# Patient Record
Sex: Female | Born: 1940
Health system: Southern US, Community
[De-identification: ages and names within clinical notes are randomized; demographics above are authoritative.]

## PROBLEM LIST (undated history)

## (undated) DIAGNOSIS — R200 Anesthesia of skin: Secondary | ICD-10-CM

## (undated) DIAGNOSIS — I639 Cerebral infarction, unspecified: Secondary | ICD-10-CM

## (undated) DIAGNOSIS — R010 Benign and innocent cardiac murmurs: Secondary | ICD-10-CM

## (undated) DIAGNOSIS — R351 Nocturia: Secondary | ICD-10-CM

## (undated) DIAGNOSIS — Z22322 Carrier or suspected carrier of Methicillin resistant Staphylococcus aureus: Secondary | ICD-10-CM

## (undated) DIAGNOSIS — I82409 Acute embolism and thrombosis of unspecified deep veins of unspecified lower extremity: Secondary | ICD-10-CM

## (undated) DIAGNOSIS — F329 Major depressive disorder, single episode, unspecified: Secondary | ICD-10-CM

## (undated) DIAGNOSIS — M1990 Unspecified osteoarthritis, unspecified site: Secondary | ICD-10-CM

## (undated) DIAGNOSIS — R519 Headache, unspecified: Secondary | ICD-10-CM

## (undated) DIAGNOSIS — E785 Hyperlipidemia, unspecified: Secondary | ICD-10-CM

## (undated) DIAGNOSIS — I6529 Occlusion and stenosis of unspecified carotid artery: Secondary | ICD-10-CM

## (undated) DIAGNOSIS — E079 Disorder of thyroid, unspecified: Secondary | ICD-10-CM

## (undated) DIAGNOSIS — R350 Frequency of micturition: Secondary | ICD-10-CM

## (undated) DIAGNOSIS — I10 Essential (primary) hypertension: Secondary | ICD-10-CM

## (undated) DIAGNOSIS — E669 Obesity, unspecified: Secondary | ICD-10-CM

## (undated) DIAGNOSIS — F419 Anxiety disorder, unspecified: Secondary | ICD-10-CM

## (undated) DIAGNOSIS — Z8673 Personal history of transient ischemic attack (TIA), and cerebral infarction without residual deficits: Secondary | ICD-10-CM

## (undated) DIAGNOSIS — E039 Hypothyroidism, unspecified: Secondary | ICD-10-CM

## (undated) DIAGNOSIS — F32A Depression, unspecified: Secondary | ICD-10-CM

## (undated) DIAGNOSIS — R202 Paresthesia of skin: Secondary | ICD-10-CM

## (undated) HISTORY — DX: Depression, unspecified: F32.A

## (undated) HISTORY — DX: Carrier or suspected carrier of methicillin resistant Staphylococcus aureus: Z22.322

## (undated) HISTORY — DX: Obesity, unspecified: E66.9

## (undated) HISTORY — DX: Disorder of thyroid, unspecified: E07.9

## (undated) HISTORY — DX: Unspecified osteoarthritis, unspecified site: M19.90

## (undated) HISTORY — PX: KNEE ARTHROSCOPY: SUR90

## (undated) HISTORY — DX: Hypothyroidism, unspecified: E03.9

## (undated) HISTORY — DX: Acute embolism and thrombosis of unspecified deep veins of unspecified lower extremity: I82.409

## (undated) HISTORY — DX: Essential (primary) hypertension: I10

## (undated) HISTORY — DX: Hyperlipidemia, unspecified: E78.5

## (undated) HISTORY — DX: Major depressive disorder, single episode, unspecified: F32.9

## (undated) HISTORY — PX: COLONOSCOPY: SHX174

## (undated) HISTORY — PX: OTHER SURGICAL HISTORY: SHX169

---

## 1971-07-24 HISTORY — PX: ABDOMINAL HYSTERECTOMY: SHX81

## 1989-03-23 HISTORY — PX: OTHER SURGICAL HISTORY: SHX169

## 1998-07-23 HISTORY — PX: CARPAL TUNNEL RELEASE: SHX101

## 2000-12-05 ENCOUNTER — Encounter: Admission: RE | Admit: 2000-12-05 | Discharge: 2000-12-05 | Payer: Self-pay | Admitting: Orthopaedic Surgery

## 2000-12-05 ENCOUNTER — Encounter: Payer: Self-pay | Admitting: Orthopaedic Surgery

## 2001-06-09 ENCOUNTER — Other Ambulatory Visit: Admission: RE | Admit: 2001-06-09 | Discharge: 2001-06-09 | Payer: Self-pay | Admitting: Obstetrics and Gynecology

## 2002-05-22 ENCOUNTER — Encounter: Payer: Self-pay | Admitting: Orthopaedic Surgery

## 2002-05-26 ENCOUNTER — Inpatient Hospital Stay (HOSPITAL_COMMUNITY): Admission: RE | Admit: 2002-05-26 | Discharge: 2002-05-29 | Payer: Self-pay | Admitting: Orthopaedic Surgery

## 2002-05-26 HISTORY — PX: TOTAL KNEE ARTHROPLASTY: SHX125

## 2002-12-17 ENCOUNTER — Other Ambulatory Visit: Admission: RE | Admit: 2002-12-17 | Discharge: 2002-12-17 | Payer: Self-pay | Admitting: Obstetrics and Gynecology

## 2004-05-11 ENCOUNTER — Other Ambulatory Visit: Admission: RE | Admit: 2004-05-11 | Discharge: 2004-05-11 | Payer: Self-pay | Admitting: Obstetrics and Gynecology

## 2004-06-02 ENCOUNTER — Ambulatory Visit: Payer: Self-pay | Admitting: Internal Medicine

## 2004-07-21 ENCOUNTER — Encounter: Payer: Self-pay | Admitting: Orthopaedic Surgery

## 2004-07-23 DIAGNOSIS — I82409 Acute embolism and thrombosis of unspecified deep veins of unspecified lower extremity: Secondary | ICD-10-CM

## 2004-07-23 HISTORY — DX: Acute embolism and thrombosis of unspecified deep veins of unspecified lower extremity: I82.409

## 2004-08-03 ENCOUNTER — Ambulatory Visit: Payer: Self-pay | Admitting: Internal Medicine

## 2004-08-24 ENCOUNTER — Ambulatory Visit: Admission: RE | Admit: 2004-08-24 | Discharge: 2004-08-24 | Payer: Self-pay | Admitting: Orthopaedic Surgery

## 2004-08-28 ENCOUNTER — Ambulatory Visit: Payer: Self-pay | Admitting: Internal Medicine

## 2004-09-01 ENCOUNTER — Ambulatory Visit: Payer: Self-pay | Admitting: Internal Medicine

## 2004-09-08 ENCOUNTER — Ambulatory Visit: Payer: Self-pay | Admitting: Internal Medicine

## 2004-09-22 ENCOUNTER — Ambulatory Visit: Payer: Self-pay | Admitting: Internal Medicine

## 2004-10-23 ENCOUNTER — Ambulatory Visit: Payer: Self-pay | Admitting: Internal Medicine

## 2004-11-22 ENCOUNTER — Ambulatory Visit: Payer: Self-pay | Admitting: Internal Medicine

## 2004-12-04 ENCOUNTER — Ambulatory Visit: Payer: Self-pay | Admitting: Internal Medicine

## 2004-12-11 ENCOUNTER — Ambulatory Visit: Payer: Self-pay

## 2005-01-01 ENCOUNTER — Ambulatory Visit: Payer: Self-pay | Admitting: Internal Medicine

## 2005-01-03 ENCOUNTER — Ambulatory Visit: Payer: Self-pay | Admitting: Internal Medicine

## 2005-01-10 ENCOUNTER — Ambulatory Visit: Payer: Self-pay | Admitting: Internal Medicine

## 2005-02-08 ENCOUNTER — Ambulatory Visit: Payer: Self-pay | Admitting: Internal Medicine

## 2005-02-13 ENCOUNTER — Ambulatory Visit: Payer: Self-pay | Admitting: Internal Medicine

## 2005-02-16 ENCOUNTER — Encounter: Payer: Self-pay | Admitting: Internal Medicine

## 2005-03-08 ENCOUNTER — Encounter: Admission: RE | Admit: 2005-03-08 | Discharge: 2005-04-16 | Payer: Self-pay | Admitting: Internal Medicine

## 2005-04-11 ENCOUNTER — Ambulatory Visit: Payer: Self-pay | Admitting: Internal Medicine

## 2005-06-13 ENCOUNTER — Ambulatory Visit: Payer: Self-pay | Admitting: Internal Medicine

## 2005-07-05 ENCOUNTER — Ambulatory Visit: Payer: Self-pay | Admitting: Internal Medicine

## 2005-09-03 ENCOUNTER — Ambulatory Visit: Payer: Self-pay | Admitting: Infectious Diseases

## 2005-11-08 ENCOUNTER — Ambulatory Visit: Payer: Self-pay | Admitting: Internal Medicine

## 2005-11-15 ENCOUNTER — Ambulatory Visit: Payer: Self-pay | Admitting: Internal Medicine

## 2005-11-16 ENCOUNTER — Ambulatory Visit: Payer: Self-pay | Admitting: Family Medicine

## 2005-11-27 ENCOUNTER — Ambulatory Visit: Payer: Self-pay | Admitting: Internal Medicine

## 2006-03-28 ENCOUNTER — Ambulatory Visit (HOSPITAL_BASED_OUTPATIENT_CLINIC_OR_DEPARTMENT_OTHER): Admission: RE | Admit: 2006-03-28 | Discharge: 2006-03-29 | Payer: Self-pay | Admitting: Orthopedic Surgery

## 2006-03-28 HISTORY — PX: JOINT REPLACEMENT: SHX530

## 2006-04-04 ENCOUNTER — Ambulatory Visit: Payer: Self-pay | Admitting: Internal Medicine

## 2006-04-23 ENCOUNTER — Ambulatory Visit: Payer: Self-pay | Admitting: Internal Medicine

## 2006-07-18 ENCOUNTER — Ambulatory Visit: Payer: Self-pay | Admitting: Internal Medicine

## 2006-07-18 LAB — CONVERTED CEMR LAB
Creatinine,U: 71 mg/dL
Hgb A1c MFr Bld: 6.2 % — ABNORMAL HIGH (ref 4.6–6.0)
Microalb Creat Ratio: 7 mg/g (ref 0.0–30.0)

## 2006-11-11 DIAGNOSIS — M1990 Unspecified osteoarthritis, unspecified site: Secondary | ICD-10-CM | POA: Insufficient documentation

## 2006-11-11 DIAGNOSIS — R945 Abnormal results of liver function studies: Secondary | ICD-10-CM | POA: Insufficient documentation

## 2006-11-11 DIAGNOSIS — T50995A Adverse effect of other drugs, medicaments and biological substances, initial encounter: Secondary | ICD-10-CM | POA: Insufficient documentation

## 2006-11-11 DIAGNOSIS — Z9889 Other specified postprocedural states: Secondary | ICD-10-CM | POA: Insufficient documentation

## 2006-12-10 ENCOUNTER — Ambulatory Visit: Payer: Self-pay | Admitting: Internal Medicine

## 2006-12-10 ENCOUNTER — Encounter: Payer: Self-pay | Admitting: Internal Medicine

## 2006-12-10 LAB — CONVERTED CEMR LAB
BUN: 19 mg/dL (ref 6–23)
Basophils Absolute: 0 10*3/uL (ref 0.0–0.1)
Basophils Relative: 0.3 % (ref 0.0–1.0)
Creatinine, Ser: 0.6 mg/dL (ref 0.4–1.2)
Eosinophils Absolute: 0.2 10*3/uL (ref 0.0–0.6)
Eosinophils Relative: 3 % (ref 0.0–5.0)
HDL: 37 mg/dL — ABNORMAL LOW (ref >39.0)
Hemoglobin: 14.2 g/dL (ref 12.0–15.0)
Lymphocytes Relative: 24.9 % (ref 12.0–46.0)
MCV: 89.4 fL (ref 78.0–100.0)
Monocytes Absolute: 0.5 10*3/uL (ref 0.2–0.7)
Neutro Abs: 5.3 10*3/uL (ref 1.4–7.7)
Neutrophils Relative %: 65.1 % (ref 43.0–77.0)
Platelets: 230 10*3/uL (ref 150–400)
Potassium: 3.8 meq/L (ref 3.5–5.1)
TSH: 1.04 microintl units/mL (ref 0.35–5.50)
Triglycerides: 145 mg/dL (ref 0–149)
WBC: 8 10*3/uL (ref 4.5–10.5)

## 2006-12-24 ENCOUNTER — Ambulatory Visit: Payer: Self-pay | Admitting: Internal Medicine

## 2007-02-04 ENCOUNTER — Encounter: Payer: Self-pay | Admitting: Internal Medicine

## 2007-02-17 ENCOUNTER — Telehealth (INDEPENDENT_AMBULATORY_CARE_PROVIDER_SITE_OTHER): Payer: Self-pay | Admitting: *Deleted

## 2007-06-17 ENCOUNTER — Ambulatory Visit: Payer: Self-pay | Admitting: Internal Medicine

## 2007-06-17 DIAGNOSIS — M810 Age-related osteoporosis without current pathological fracture: Secondary | ICD-10-CM | POA: Insufficient documentation

## 2007-06-17 DIAGNOSIS — E782 Mixed hyperlipidemia: Secondary | ICD-10-CM | POA: Insufficient documentation

## 2007-06-17 DIAGNOSIS — E1159 Type 2 diabetes mellitus with other circulatory complications: Secondary | ICD-10-CM | POA: Insufficient documentation

## 2007-06-17 DIAGNOSIS — E1169 Type 2 diabetes mellitus with other specified complication: Secondary | ICD-10-CM | POA: Insufficient documentation

## 2007-06-17 DIAGNOSIS — I10 Essential (primary) hypertension: Secondary | ICD-10-CM | POA: Insufficient documentation

## 2007-06-17 DIAGNOSIS — E785 Hyperlipidemia, unspecified: Secondary | ICD-10-CM | POA: Insufficient documentation

## 2007-06-17 DIAGNOSIS — M8580 Other specified disorders of bone density and structure, unspecified site: Secondary | ICD-10-CM | POA: Insufficient documentation

## 2007-06-22 LAB — CONVERTED CEMR LAB
ALT: 20 units/L (ref 0–35)
AST: 18 units/L (ref 0–37)
Cholesterol: 168 mg/dL (ref 0–200)
Creatinine,U: 112.6 mg/dL
Hgb A1c MFr Bld: 6.3 % — ABNORMAL HIGH (ref 4.6–6.0)
Microalb, Ur: 2 mg/dL — ABNORMAL HIGH (ref 0.0–1.9)
Total CHOL/HDL Ratio: 5.2

## 2007-06-23 ENCOUNTER — Encounter (INDEPENDENT_AMBULATORY_CARE_PROVIDER_SITE_OTHER): Payer: Self-pay | Admitting: *Deleted

## 2007-09-19 ENCOUNTER — Telehealth (INDEPENDENT_AMBULATORY_CARE_PROVIDER_SITE_OTHER): Payer: Self-pay | Admitting: *Deleted

## 2007-11-13 ENCOUNTER — Telehealth (INDEPENDENT_AMBULATORY_CARE_PROVIDER_SITE_OTHER): Payer: Self-pay | Admitting: *Deleted

## 2007-11-13 ENCOUNTER — Ambulatory Visit: Payer: Self-pay | Admitting: Family Medicine

## 2007-11-20 ENCOUNTER — Ambulatory Visit: Payer: Self-pay | Admitting: Family Medicine

## 2007-11-21 ENCOUNTER — Encounter: Payer: Self-pay | Admitting: Family Medicine

## 2007-11-23 LAB — CONVERTED CEMR LAB: Vit D, 1,25-Dihydroxy: 26 — ABNORMAL LOW (ref 30–89)

## 2007-11-25 ENCOUNTER — Encounter (INDEPENDENT_AMBULATORY_CARE_PROVIDER_SITE_OTHER): Payer: Self-pay | Admitting: *Deleted

## 2007-12-03 ENCOUNTER — Encounter: Payer: Self-pay | Admitting: Internal Medicine

## 2007-12-10 ENCOUNTER — Ambulatory Visit: Payer: Self-pay | Admitting: Internal Medicine

## 2007-12-10 DIAGNOSIS — E559 Vitamin D deficiency, unspecified: Secondary | ICD-10-CM | POA: Insufficient documentation

## 2007-12-10 LAB — CONVERTED CEMR LAB
Microalb Creat Ratio: 79 mg/g — ABNORMAL HIGH (ref 0.0–30.0)
Microalb, Ur: 6.2 mg/dL — ABNORMAL HIGH (ref 0.0–1.9)

## 2007-12-12 ENCOUNTER — Encounter (INDEPENDENT_AMBULATORY_CARE_PROVIDER_SITE_OTHER): Payer: Self-pay | Admitting: *Deleted

## 2007-12-23 ENCOUNTER — Ambulatory Visit: Payer: Self-pay | Admitting: Internal Medicine

## 2007-12-23 ENCOUNTER — Encounter: Payer: Self-pay | Admitting: Internal Medicine

## 2007-12-26 ENCOUNTER — Telehealth (INDEPENDENT_AMBULATORY_CARE_PROVIDER_SITE_OTHER): Payer: Self-pay | Admitting: *Deleted

## 2008-01-19 ENCOUNTER — Encounter (INDEPENDENT_AMBULATORY_CARE_PROVIDER_SITE_OTHER): Payer: Self-pay | Admitting: *Deleted

## 2008-02-26 ENCOUNTER — Ambulatory Visit: Payer: Self-pay | Admitting: Internal Medicine

## 2008-02-27 ENCOUNTER — Ambulatory Visit: Payer: Self-pay | Admitting: Internal Medicine

## 2008-03-01 ENCOUNTER — Encounter (INDEPENDENT_AMBULATORY_CARE_PROVIDER_SITE_OTHER): Payer: Self-pay | Admitting: *Deleted

## 2008-03-01 LAB — CONVERTED CEMR LAB
ALT: 21 units/L (ref 0–35)
AST: 19 units/L (ref 0–37)
Bilirubin, Direct: 0.1 mg/dL (ref 0.0–0.3)
Creatinine, Ser: 0.6 mg/dL (ref 0.4–1.2)
HDL: 29.7 mg/dL — ABNORMAL LOW (ref >39.0)
Hgb A1c MFr Bld: 6.6 % — ABNORMAL HIGH (ref 4.6–6.0)
Potassium: 4.3 meq/L (ref 3.5–5.1)
Total Bilirubin: 1 mg/dL (ref 0.3–1.2)
Total CHOL/HDL Ratio: 3.9

## 2008-03-07 LAB — CONVERTED CEMR LAB: Vit D, 1,25-Dihydroxy: 35 (ref 30–89)

## 2008-03-08 ENCOUNTER — Encounter (INDEPENDENT_AMBULATORY_CARE_PROVIDER_SITE_OTHER): Payer: Self-pay | Admitting: *Deleted

## 2008-03-30 ENCOUNTER — Inpatient Hospital Stay (HOSPITAL_COMMUNITY): Admission: RE | Admit: 2008-03-30 | Discharge: 2008-04-02 | Payer: Self-pay | Admitting: Orthopaedic Surgery

## 2008-04-12 ENCOUNTER — Ambulatory Visit: Payer: Self-pay | Admitting: Vascular Surgery

## 2008-04-12 ENCOUNTER — Encounter (INDEPENDENT_AMBULATORY_CARE_PROVIDER_SITE_OTHER): Payer: Self-pay | Admitting: Orthopaedic Surgery

## 2008-04-12 ENCOUNTER — Ambulatory Visit (HOSPITAL_COMMUNITY): Admission: RE | Admit: 2008-04-12 | Discharge: 2008-04-12 | Payer: Self-pay | Admitting: Orthopaedic Surgery

## 2008-04-14 ENCOUNTER — Ambulatory Visit: Payer: Self-pay | Admitting: Internal Medicine

## 2008-04-14 DIAGNOSIS — L089 Local infection of the skin and subcutaneous tissue, unspecified: Secondary | ICD-10-CM | POA: Insufficient documentation

## 2008-05-05 ENCOUNTER — Ambulatory Visit: Payer: Self-pay | Admitting: Internal Medicine

## 2008-05-10 ENCOUNTER — Telehealth (INDEPENDENT_AMBULATORY_CARE_PROVIDER_SITE_OTHER): Payer: Self-pay | Admitting: *Deleted

## 2008-06-08 ENCOUNTER — Telehealth (INDEPENDENT_AMBULATORY_CARE_PROVIDER_SITE_OTHER): Payer: Self-pay | Admitting: *Deleted

## 2008-07-05 ENCOUNTER — Ambulatory Visit: Payer: Self-pay | Admitting: Internal Medicine

## 2008-07-08 ENCOUNTER — Encounter (INDEPENDENT_AMBULATORY_CARE_PROVIDER_SITE_OTHER): Payer: Self-pay | Admitting: *Deleted

## 2008-07-08 ENCOUNTER — Telehealth (INDEPENDENT_AMBULATORY_CARE_PROVIDER_SITE_OTHER): Payer: Self-pay | Admitting: *Deleted

## 2008-07-08 LAB — CONVERTED CEMR LAB
BUN: 18 mg/dL (ref 6–23)
Creatinine, Ser: 0.7 mg/dL (ref 0.4–1.2)
Hgb A1c MFr Bld: 6 % (ref 4.6–6.0)
Microalb, Ur: 0.5 mg/dL (ref 0.0–1.9)
Potassium: 4.1 meq/L (ref 3.5–5.1)

## 2008-07-12 ENCOUNTER — Ambulatory Visit: Payer: Self-pay | Admitting: Internal Medicine

## 2008-07-12 DIAGNOSIS — R5381 Other malaise: Secondary | ICD-10-CM | POA: Insufficient documentation

## 2008-07-12 DIAGNOSIS — R5383 Other fatigue: Secondary | ICD-10-CM | POA: Insufficient documentation

## 2008-07-12 LAB — CONVERTED CEMR LAB
Basophils Relative: 0.2 % (ref 0.0–3.0)
Eosinophils Relative: 2.6 % (ref 0.0–5.0)
Hemoglobin: 14.1 g/dL (ref 12.0–15.0)
Lymphocytes Relative: 25.6 % (ref 12.0–46.0)
MCV: 90 fL (ref 78.0–100.0)
Neutro Abs: 4.6 10*3/uL (ref 1.4–7.7)
Neutrophils Relative %: 63.7 % (ref 43.0–77.0)

## 2008-07-13 ENCOUNTER — Encounter (INDEPENDENT_AMBULATORY_CARE_PROVIDER_SITE_OTHER): Payer: Self-pay | Admitting: *Deleted

## 2008-08-24 ENCOUNTER — Emergency Department (HOSPITAL_COMMUNITY): Admission: EM | Admit: 2008-08-24 | Discharge: 2008-08-24 | Payer: Self-pay | Admitting: Emergency Medicine

## 2008-09-20 ENCOUNTER — Encounter: Payer: Self-pay | Admitting: Internal Medicine

## 2008-11-11 ENCOUNTER — Ambulatory Visit: Payer: Self-pay | Admitting: Internal Medicine

## 2008-11-11 LAB — CONVERTED CEMR LAB
Nitrite: NEGATIVE
Protein, U semiquant: NEGATIVE
Specific Gravity, Urine: 1.015

## 2008-11-12 ENCOUNTER — Encounter: Payer: Self-pay | Admitting: Internal Medicine

## 2008-11-15 ENCOUNTER — Encounter (INDEPENDENT_AMBULATORY_CARE_PROVIDER_SITE_OTHER): Payer: Self-pay | Admitting: *Deleted

## 2009-01-05 ENCOUNTER — Ambulatory Visit: Payer: Self-pay | Admitting: Internal Medicine

## 2009-01-10 ENCOUNTER — Encounter (INDEPENDENT_AMBULATORY_CARE_PROVIDER_SITE_OTHER): Payer: Self-pay | Admitting: *Deleted

## 2009-01-21 ENCOUNTER — Ambulatory Visit: Payer: Self-pay | Admitting: Internal Medicine

## 2009-01-21 DIAGNOSIS — E041 Nontoxic single thyroid nodule: Secondary | ICD-10-CM | POA: Insufficient documentation

## 2009-01-25 ENCOUNTER — Encounter (INDEPENDENT_AMBULATORY_CARE_PROVIDER_SITE_OTHER): Payer: Self-pay | Admitting: *Deleted

## 2009-01-26 ENCOUNTER — Encounter: Admission: RE | Admit: 2009-01-26 | Discharge: 2009-01-26 | Payer: Self-pay | Admitting: Internal Medicine

## 2009-01-28 ENCOUNTER — Encounter (INDEPENDENT_AMBULATORY_CARE_PROVIDER_SITE_OTHER): Payer: Self-pay | Admitting: *Deleted

## 2009-02-03 ENCOUNTER — Telehealth (INDEPENDENT_AMBULATORY_CARE_PROVIDER_SITE_OTHER): Payer: Self-pay | Admitting: *Deleted

## 2009-02-22 ENCOUNTER — Encounter (INDEPENDENT_AMBULATORY_CARE_PROVIDER_SITE_OTHER): Payer: Self-pay | Admitting: Interventional Radiology

## 2009-02-22 ENCOUNTER — Other Ambulatory Visit: Admission: RE | Admit: 2009-02-22 | Discharge: 2009-02-22 | Payer: Self-pay | Admitting: Interventional Radiology

## 2009-02-22 ENCOUNTER — Encounter: Admission: RE | Admit: 2009-02-22 | Discharge: 2009-02-22 | Payer: Self-pay | Admitting: Family Medicine

## 2009-02-22 ENCOUNTER — Encounter: Payer: Self-pay | Admitting: Internal Medicine

## 2009-03-02 ENCOUNTER — Telehealth (INDEPENDENT_AMBULATORY_CARE_PROVIDER_SITE_OTHER): Payer: Self-pay | Admitting: *Deleted

## 2009-04-29 ENCOUNTER — Encounter: Payer: Self-pay | Admitting: Internal Medicine

## 2009-05-11 ENCOUNTER — Encounter: Admission: RE | Admit: 2009-05-11 | Discharge: 2009-05-11 | Payer: Self-pay | Admitting: Neurosurgery

## 2009-05-25 ENCOUNTER — Encounter: Admission: RE | Admit: 2009-05-25 | Discharge: 2009-05-25 | Payer: Self-pay | Admitting: Neurosurgery

## 2009-06-09 ENCOUNTER — Encounter: Payer: Self-pay | Admitting: Internal Medicine

## 2009-06-28 ENCOUNTER — Ambulatory Visit: Payer: Self-pay | Admitting: Internal Medicine

## 2009-07-04 ENCOUNTER — Ambulatory Visit: Payer: Self-pay | Admitting: Internal Medicine

## 2009-07-04 LAB — CONVERTED CEMR LAB
Albumin: 4 g/dL (ref 3.5–5.2)
Bilirubin, Direct: 0 mg/dL (ref 0.0–0.3)
Cholesterol: 147 mg/dL (ref 0–200)
Hgb A1c MFr Bld: 6.1 % (ref 4.6–6.5)
Total Bilirubin: 0.8 mg/dL (ref 0.3–1.2)
Total Protein: 7 g/dL (ref 6.0–8.3)
VLDL: 24.2 mg/dL (ref 0.0–40.0)

## 2009-08-01 ENCOUNTER — Encounter: Payer: Self-pay | Admitting: Internal Medicine

## 2009-08-26 ENCOUNTER — Inpatient Hospital Stay (HOSPITAL_COMMUNITY): Admission: RE | Admit: 2009-08-26 | Discharge: 2009-08-28 | Payer: Self-pay | Admitting: Neurosurgery

## 2009-08-26 HISTORY — PX: LUMBAR LAMINECTOMY: SHX95

## 2009-09-22 ENCOUNTER — Encounter: Payer: Self-pay | Admitting: Internal Medicine

## 2009-10-11 ENCOUNTER — Ambulatory Visit: Payer: Self-pay | Admitting: Internal Medicine

## 2009-10-12 ENCOUNTER — Encounter: Payer: Self-pay | Admitting: Internal Medicine

## 2009-10-14 ENCOUNTER — Telehealth (INDEPENDENT_AMBULATORY_CARE_PROVIDER_SITE_OTHER): Payer: Self-pay | Admitting: *Deleted

## 2009-10-18 LAB — CONVERTED CEMR LAB: Hgb A1c MFr Bld: 5.8 % (ref 4.6–6.5)

## 2009-12-29 ENCOUNTER — Encounter: Payer: Self-pay | Admitting: Internal Medicine

## 2010-01-24 ENCOUNTER — Ambulatory Visit: Payer: Self-pay | Admitting: Internal Medicine

## 2010-01-24 DIAGNOSIS — E119 Type 2 diabetes mellitus without complications: Secondary | ICD-10-CM | POA: Insufficient documentation

## 2010-01-24 DIAGNOSIS — E1329 Other specified diabetes mellitus with other diabetic kidney complication: Secondary | ICD-10-CM | POA: Insufficient documentation

## 2010-01-24 DIAGNOSIS — E1365 Other specified diabetes mellitus with hyperglycemia: Secondary | ICD-10-CM

## 2010-01-24 DIAGNOSIS — IMO0002 Reserved for concepts with insufficient information to code with codable children: Secondary | ICD-10-CM | POA: Insufficient documentation

## 2010-01-24 DIAGNOSIS — R300 Dysuria: Secondary | ICD-10-CM | POA: Insufficient documentation

## 2010-01-24 DIAGNOSIS — E1169 Type 2 diabetes mellitus with other specified complication: Secondary | ICD-10-CM | POA: Insufficient documentation

## 2010-01-24 LAB — CONVERTED CEMR LAB
Bilirubin Urine: NEGATIVE
Ketones, urine, test strip: NEGATIVE
Protein, U semiquant: NEGATIVE
Urobilinogen, UA: 0.2

## 2010-01-25 ENCOUNTER — Ambulatory Visit: Payer: Self-pay | Admitting: Internal Medicine

## 2010-01-26 ENCOUNTER — Telehealth (INDEPENDENT_AMBULATORY_CARE_PROVIDER_SITE_OTHER): Payer: Self-pay | Admitting: *Deleted

## 2010-01-26 ENCOUNTER — Encounter: Admission: RE | Admit: 2010-01-26 | Discharge: 2010-01-26 | Payer: Self-pay | Admitting: Internal Medicine

## 2010-01-26 LAB — CONVERTED CEMR LAB
ALT: 18 units/L (ref 0–35)
Alkaline Phosphatase: 47 units/L (ref 39–117)
Basophils Relative: 0.3 % (ref 0.0–3.0)
Bilirubin, Direct: 0.2 mg/dL (ref 0.0–0.3)
Chloride: 106 meq/L (ref 96–112)
Creatinine, Ser: 0.6 mg/dL (ref 0.4–1.2)
Creatinine,U: 112.9 mg/dL
Eosinophils Absolute: 0.2 10*3/uL (ref 0.0–0.7)
Hgb A1c MFr Bld: 6.5 % (ref 4.6–6.5)
LDL Cholesterol: 67 mg/dL (ref 0–99)
Lymphocytes Relative: 19.4 % (ref 12.0–46.0)
MCHC: 34.8 g/dL (ref 30.0–36.0)
Microalb Creat Ratio: 1.2 mg/g (ref 0.0–30.0)
Neutro Abs: 5.7 10*3/uL (ref 1.4–7.7)
Neutrophils Relative %: 68.9 % (ref 43.0–77.0)
Potassium: 4.6 meq/L (ref 3.5–5.1)
RBC: 4.35 M/uL (ref 3.87–5.11)
RDW: 13.4 % (ref 11.5–14.6)
Sodium: 140 meq/L (ref 135–145)
Total Bilirubin: 0.6 mg/dL (ref 0.3–1.2)
Total CHOL/HDL Ratio: 4
Triglycerides: 163 mg/dL — ABNORMAL HIGH (ref 0.0–149.0)

## 2010-02-10 ENCOUNTER — Telehealth (INDEPENDENT_AMBULATORY_CARE_PROVIDER_SITE_OTHER): Payer: Self-pay | Admitting: *Deleted

## 2010-04-11 ENCOUNTER — Ambulatory Visit: Payer: Self-pay | Admitting: Internal Medicine

## 2010-04-11 LAB — CONVERTED CEMR LAB
Ketones, urine, test strip: NEGATIVE
Nitrite: NEGATIVE
Urobilinogen, UA: 0.2

## 2010-04-12 ENCOUNTER — Encounter: Payer: Self-pay | Admitting: Internal Medicine

## 2010-04-14 ENCOUNTER — Ambulatory Visit: Payer: Self-pay | Admitting: Internal Medicine

## 2010-04-14 ENCOUNTER — Encounter: Payer: Self-pay | Admitting: Internal Medicine

## 2010-05-12 ENCOUNTER — Encounter: Payer: Self-pay | Admitting: Internal Medicine

## 2010-05-31 ENCOUNTER — Ambulatory Visit: Payer: Self-pay | Admitting: Internal Medicine

## 2010-05-31 DIAGNOSIS — I6350 Cerebral infarction due to unspecified occlusion or stenosis of unspecified cerebral artery: Secondary | ICD-10-CM | POA: Insufficient documentation

## 2010-05-31 LAB — CONVERTED CEMR LAB
ALT: 16 units/L (ref 0–35)
AST: 18 units/L (ref 0–37)
Albumin: 4.2 g/dL (ref 3.5–5.2)
Alkaline Phosphatase: 48 units/L (ref 39–117)
Chloride: 103 meq/L (ref 96–112)
Eosinophils Relative: 2.2 % (ref 0.0–5.0)
GFR calc non Af Amer: 86.05 mL/min (ref >60)
Glucose, Bld: 203 mg/dL — ABNORMAL HIGH (ref 70–99)
HCT: 38.6 % (ref 36.0–46.0)
Hemoglobin: 13.7 g/dL (ref 12.0–15.0)
Hgb A1c MFr Bld: 6.9 % — ABNORMAL HIGH (ref 4.6–6.5)
Lymphs Abs: 1.8 10*3/uL (ref 0.7–4.0)
MCV: 91.7 fL (ref 78.0–100.0)
Monocytes Relative: 6.9 % (ref 3.0–12.0)
Neutro Abs: 6 10*3/uL (ref 1.4–7.7)
Potassium: 4.6 meq/L (ref 3.5–5.1)
RDW: 13.7 % (ref 11.5–14.6)
Sodium: 141 meq/L (ref 135–145)
Total Protein: 6.4 g/dL (ref 6.0–8.3)
WBC: 8.6 10*3/uL (ref 4.5–10.5)

## 2010-06-01 ENCOUNTER — Ambulatory Visit: Payer: Self-pay | Admitting: Internal Medicine

## 2010-06-01 ENCOUNTER — Ambulatory Visit: Payer: Self-pay | Admitting: Cardiology

## 2010-06-07 ENCOUNTER — Encounter: Admission: RE | Admit: 2010-06-07 | Discharge: 2010-06-07 | Payer: Self-pay | Admitting: Neurology

## 2010-06-12 ENCOUNTER — Encounter: Payer: Self-pay | Admitting: Internal Medicine

## 2010-06-19 ENCOUNTER — Ambulatory Visit (HOSPITAL_COMMUNITY): Admission: RE | Admit: 2010-06-19 | Discharge: 2010-06-19 | Payer: Self-pay | Admitting: Neurology

## 2010-06-19 ENCOUNTER — Encounter (INDEPENDENT_AMBULATORY_CARE_PROVIDER_SITE_OTHER): Payer: Self-pay | Admitting: Neurology

## 2010-06-19 ENCOUNTER — Ambulatory Visit: Payer: Self-pay | Admitting: Internal Medicine

## 2010-06-19 ENCOUNTER — Ambulatory Visit: Payer: Self-pay

## 2010-06-26 ENCOUNTER — Telehealth: Payer: Self-pay | Admitting: Internal Medicine

## 2010-07-18 ENCOUNTER — Telehealth: Payer: Self-pay | Admitting: Internal Medicine

## 2010-08-07 ENCOUNTER — Encounter
Admission: RE | Admit: 2010-08-07 | Discharge: 2010-08-07 | Payer: Self-pay | Source: Home / Self Care | Attending: Neurology | Admitting: Neurology

## 2010-08-08 ENCOUNTER — Ambulatory Visit
Admission: RE | Admit: 2010-08-08 | Discharge: 2010-08-08 | Payer: Self-pay | Source: Home / Self Care | Attending: Internal Medicine | Admitting: Internal Medicine

## 2010-08-08 ENCOUNTER — Other Ambulatory Visit: Payer: Self-pay | Admitting: Internal Medicine

## 2010-08-08 LAB — CONVERTED CEMR LAB
Glucose, Urine, Semiquant: NEGATIVE
Nitrite: POSITIVE
Urobilinogen, UA: 0.2
pH: 5

## 2010-08-08 LAB — HEMOGLOBIN A1C: Hgb A1c MFr Bld: 6.4 % (ref 4.6–6.5)

## 2010-08-08 LAB — BUN: BUN: 18 mg/dL (ref 6–23)

## 2010-08-08 LAB — POTASSIUM: Potassium: 4.4 mEq/L (ref 3.5–5.1)

## 2010-08-08 LAB — CREATININE, SERUM: Creatinine, Ser: 0.7 mg/dL (ref 0.4–1.2)

## 2010-08-09 ENCOUNTER — Encounter: Payer: Self-pay | Admitting: Internal Medicine

## 2010-08-11 ENCOUNTER — Telehealth (INDEPENDENT_AMBULATORY_CARE_PROVIDER_SITE_OTHER): Payer: Self-pay | Admitting: *Deleted

## 2010-08-20 LAB — CONVERTED CEMR LAB
T3, Free: 2.9 pg/mL (ref 2.3–4.2)
Vit D, 25-Hydroxy: 24 ng/mL — ABNORMAL LOW (ref 30–89)

## 2010-08-24 NOTE — Progress Notes (Signed)
Summary: Culture Results  Phone Note Outgoing Call Call back at Home Phone [phone]   Call placed by: Shonna Chock,  January 26, 2010 4:58 PM Call placed to: Patient Summary of Call: Per Dr.Hopper patient with UTI please contact patient and send in RX for Cipro #20 two times a day if not allergic   I reviewed allergy list, cipro not listed. I left message on VM informing patient current UTI, rx sent to pharmacy on file [facility]. Patient to call if any questions or concerns./Chrae [facility]  January 26, 2010 5:00 PM     New/Updated Medications: CIPRO 500 MG TABS (CIPROFLOXACIN HCL) 1 by mouth two times a day Prescriptions: CIPRO 500 MG TABS (CIPROFLOXACIN HCL) 1 by mouth two times a day  #20 x 0   Entered by:   Shonna Chock   Authorized by:   Marga Melnick MD   Signed by:   Shonna Chock on 01/26/2010   Method used:   Electronically to        [facility] [phone][facility] (retail)       [address] [place], [place]  [address]       Ph: [phone]       Fax: [phone]   RxID:   [id number]

## 2010-08-24 NOTE — Progress Notes (Signed)
Summary: Januvia/Metformin  Phone Note Call from Patient Call back at Home Phone [phone]   Summary of Call: Patient left message on triage asking if she is to continue Januvia? If she is, she was also wondering if she should stop Metformin. Please advise. Initial call taken by: Lucious Groves CMA,  June 26, 2010 3:04 PM  Follow-up for Phone Call        Januvia 100 mg once daily & Metformin 1000 mg two times a day with meals. Check A1c, BUN,creat & K+ after 6 weeks . A1c was 6.9%, just barely @ goal of < 7% in 11/11 Follow-up by: Marga Melnick MD,  June 26, 2010 5:31 PM  Additional Follow-up for Phone Call Additional follow up Details #1::        Patient notified of the above earlier today and all prescriptions have been faxed.  Additional Follow-up by: Lucious Groves CMA,  June 27, 2010 3:26 PM    Prescriptions: JANUVIA 100 MG TABS (SITAGLIPTIN PHOSPHATE) 1 once daily  #30 x 0   Entered by:   Lucious Groves CMA   Authorized by:   Marga Melnick MD   Signed by:   Lucious Groves CMA on 06/27/2010   Method used:   Electronically to        [facility] [phone]* (retail)       [address] [place], [place]  [address]       Ph: [phone]       Fax: [phone]   RxID:   [id number] JANUVIA 100 MG TABS (SITAGLIPTIN PHOSPHATE) 1 once daily  #90 x 0   Entered by:   Lucious Groves CMA   Authorized by:   Marga Melnick MD   Signed by:   Lucious Groves CMA on 06/27/2010   Method used:   Printed then faxed to ...       [facility])       [address] [address]       [place], [place]  [address]       Ph: [phone]       Fax: [phone]   RxID:   [id number] CITALOPRAM HYDROBROMIDE 20 MG TABS (CITALOPRAM HYDROBROMIDE) 1 once daily  #90 x 0   Entered by:   Lucious Groves CMA   Authorized by:   Marga Melnick MD   Signed by:   Lucious Groves CMA on 06/27/2010   Method used:   Printed then faxed to ...       [facility])       [address] [address]   [place], [place]  [address]       Ph: [phone]       Fax: [phone]   RxID:   [id number] BENAZEPRIL HCL 20 MG TABS (BENAZEPRIL HCL) 1 qd  #90 x 0   Entered by:   Lucious Groves CMA   Authorized by:   Marga Melnick MD   Signed by:   Lucious Groves CMA on 06/27/2010   Method used:   Printed then faxed to ...       [facility])       [address] [address]       [place], [place]  [address]       Ph: [phone]       Fax: [phone]   RxID:   [id number] PRAVASTATIN SODIUM 40 MG  TABS (PRAVASTATIN SODIUM) 1 qhs  #90 x 0   Entered by:  Lucious Groves CMA   Authorized by:   Marga Melnick MD   Signed by:   Lucious Groves CMA on 06/27/2010   Method used:   Printed then faxed to ...       [facility])       [address] [address]       [place], [place]  [address]       Ph: [phone]       Fax: [phone]   RxID:   [id number] METFORMIN HCL 1000 MG  TABS (METFORMIN HCL) TAKE ONE TABLET BY MOUTH TWICE A DAY  #180 x 0   Entered by:   Lucious Groves CMA   Authorized by:   Marga Melnick MD   Signed by:   Lucious Groves CMA on 06/27/2010   Method used:   Printed then faxed to ...       [facility])       [address] [address]       [place], [place]  [address]       Ph: [phone]       Fax: [phone]   RxID:   [id number] KLONOPIN 0.5 MG  TABS (CLONAZEPAM) TAKE ONE TABLET BY MOUTH AT NIGHT  #90 x 0   Entered by:   Lucious Groves CMA   Authorized by:   Marga Melnick MD   Signed by:   Lucious Groves CMA on 06/27/2010   Method used:   Printed then faxed to ...       [facility])       [address] [address]       [place], [place]  [address]       Ph: [phone]       Fax: [phone]   RxID:   [id number]

## 2010-08-24 NOTE — Miscellaneous (Signed)
Summary: BONE DENSITY  Clinical Lists Changes  Orders: Added new Test order of T-Bone Densitometry (77080) - Signed Added new Test order of T-Lumbar Vertebral Assessment (77082) - Signed 

## 2010-08-24 NOTE — Progress Notes (Signed)
Summary: NEEDS PRESCRIPTION FOR METFORMIN  Phone Note Call from Patient Call back at Home Phone [phone]   Caller: Patient Summary of Call: [facility] CALLED TO GET REFILL FOR HER METFORMIN  SAID THAT DR HOPPER NEEDED TO LOOK AT HER A1C READING FROM HER LAST LABS AND THEN HE WOULD SEND PRESCRIPTION PLUS REFILLS IN TO [facility] FOR HER   Initial call taken by: Jerolyn Shin,  February 10, 2010 10:08 AM    Prescriptions: METFORMIN HCL 1000 MG  TABS (METFORMIN HCL) TAKE ONE TABLET BY MOUTH TWICE A DAY  #180 x 1   Entered by:   Shonna Chock CMA   Authorized by:   Marga Melnick MD   Signed by:   Shonna Chock CMA on 02/10/2010   Method used:   Faxed to ...       [facility])       [address] [address]       [place], [place]  [address]       Ph: [phone]       Fax: [phone]   RxID:   [id number]

## 2010-08-24 NOTE — Assessment & Plan Note (Signed)
Summary: F/U per Dr.Hopper/scm   Vital Signs:  Patient profile:   70 year old female Temp:     97.8 degrees F oral Pulse rate:   76 / minute Resp:     14 per minute BP sitting:   120 / 78  (left arm) Cuff size:   large  Vitals Entered By: Shonna Chock CMA (June 01, 2010 2:24 PM)  History of Present Illness:    Findings of hemorrhagic CVA on CT & labs discussed. DM adequately controlled @ 6.9%. She is using a cane because of being "unsteady" & fatigued. Overall there has no significant change  , but  face may be less numb.  Current Medications (verified): 1)  Caltrate With Vitamin D .... Once Daily 2)  Klonopin 0.5 Mg  Tabs (Clonazepam) .... Take One Tablet By Mouth At Night 3)  Metformin Hcl 1000 Mg  Tabs (Metformin Hcl) .... Take One Tablet By Mouth Twice A Day 4)  Baby Asa .... Once Daily 5)  Pravastatin Sodium 40 Mg  Tabs (Pravastatin Sodium) .Marland Kitchen.. 1 Qhs 6)  Freestyle Lite Test   Strp (Glucose Blood) .... Use As Directed 7)  Veramyst 27.5 Mcg/spray  Susp (Fluticasone Furoate) .... 2 Sprays Each Nostril Once Daily 8)  Benazepril Hcl 20 Mg Tabs (Benazepril Hcl) .Marland Kitchen.. 1 Qd 9)  Vit-D 600mg  .... 1 By Mouth Once Daily 10)  Citalopram Hydrobromide 20 Mg Tabs (Citalopram Hydrobromide) .Marland Kitchen.. 1 Once Daily  Allergies: 1)  ! Premarin  Review of Systems General:  Denies chills, fever, and sweats. Neuro:  Complains of disturbances in coordination and weakness; denies brief paralysis and memory loss; No change in R sided N & T except for face. She has been dropping items such as silverware.No significant headache.  Physical Exam  General:  well-nourished,in no acute distress; alert,appropriate and cooperative throughout examination Eyes:  No corneal or conjunctival inflammation noted. EOMI. Perrla. Field of  Vision grossly normal.Slight ptosis Mouth:  Oral mucosa and oropharynx without lesions or exudates.  Teeth in good repair. No tongue deviation Heart:  normal rate, regular rhythm,  no gallop, no rub, no JVD, and grade 1 /6 systolic murmur.   Pulses:  R and L carotid  pulses are full and equal bilaterallyw/o bruits Extremities:  No clubbing, cyanosis, edema. Neurologic:  alert & oriented X3, cranial nerves II-XII intact except decreased sensation R face, strength normal in all extremities ( definitely stronger in R thigh), gait abnormal with slight limp RLE( "better"), and DTRs symmetrical and normal( R knee had been decreased).   Skin:  Excoriations R back dry Psych:  memory intact for recent and remote, normally interactive, good eye contact, and not anxious appearing.     Impression & Recommendations:  Problem # 1:  C V A/STROKE (ICD-434.91) acute onset 05/30/2010 Orders: Neurology Referral (Neuro)  Problem # 2:  DM (ICD-250.00)  Her updated medication list for this problem includes:    Metformin Hcl 1000 Mg Tabs (Metformin hcl) .Marland Kitchen... Take one tablet by mouth twice a day    Benazepril Hcl 20 Mg Tabs (Benazepril hcl) .Marland Kitchen... 1 qd    Januvia 100 Mg Tabs (Sitagliptin phosphate) .Marland Kitchen... 1 once daily  Complete Medication List: 1)  Caltrate With Vitamin D  .... Once daily 2)  Klonopin 0.5 Mg Tabs (Clonazepam) .... Take one tablet by mouth at night 3)  Metformin Hcl 1000 Mg Tabs (Metformin hcl) .... Take one tablet by mouth twice a day 4)  Baby Asa  .... Once daily  5)  Pravastatin Sodium 40 Mg Tabs (Pravastatin sodium) .Marland Kitchen.. 1 qhs 6)  Freestyle Lite Test Strp (Glucose blood) .... Use as directed 7)  Veramyst 27.5 Mcg/spray Susp (Fluticasone furoate) .... 2 sprays each nostril once daily 8)  Benazepril Hcl 20 Mg Tabs (Benazepril hcl) .Marland Kitchen.. 1 qd 9)  Vit-d 600mg   .... 1 by mouth once daily 10)  Citalopram Hydrobromide 20 Mg Tabs (Citalopram hydrobromide) .Marland Kitchen.. 1 once daily 11)  Medrol 2 Mg Tabs (Methylprednisolone) .Marland Kitchen.. 1 two times a day 12)  Januvia 100 Mg Tabs (Sitagliptin phosphate) .Marland Kitchen.. 1 once daily  Patient Instructions: 1)  Hold Metformin as per instructions.Check  your blood sugars regularly.Your goal = 90-150 fasting & < 180 two hrs after largest meal.Check your Blood Pressure regularly. If it is above:130/80  ON AVERAGE  you should make an appointment. To ER if symptoms progress as discussed. Prescriptions: JANUVIA 100 MG TABS (SITAGLIPTIN PHOSPHATE) 1 once daily  #30 x 5   Entered and Authorized by:   Marga Melnick MD   Signed by:   Marga Melnick MD on 06/01/2010   Method used:   Print then Give to Patient   RxID:   [id number] MEDROL 2 MG TABS (METHYLPREDNISOLONE) 1 two times a day  #20 x 0   Entered and Authorized by:   Marga Melnick MD   Signed by:   Marga Melnick MD on 06/01/2010   Method used:   Print then Give to Patient   RxID:   [id number]    Orders Added: 1)  Est. Patient Level III [[address]] 2)  Neurology Referral [Neuro]  Appended Document: F/U per Dr.Hopper/scm    Prescriptions: BLOOD PRESSURE CUFF  MISC (BLOOD PRESSURE MONITORING) Check bloodpressure 1-2 x daily, DX: 434.91  #1 x 0   Entered by:   Shonna Chock CMA   Authorized by:   Marga Melnick MD   Signed by:   Shonna Chock CMA on 06/01/2010   Method used:   Electronically to        [facility] [address] (retail)       [phone]       [place], [place]  [address]       Ph: [phone]       Fax: [phone]   RxID:   [id number]

## 2010-08-24 NOTE — Progress Notes (Addendum)
Summary: Culture Results  Phone Note Outgoing Call Call back at Home Phone [phone]   Call placed by: Shonna Chock CMA,  August 11, 2010 9:53 AM Call placed to: Patient Details for Reason: Urine Culture Results Summary of Call: Left message on machine for patient to return call when avaliable, Reason for call:  Recommend Septra DS generic two times a day #20. Reculture after course completed   **Need pharmacy location**Chrae [facility] CMA  August 11, 2010 9:53 AM    Follow-up for Phone Call        pt [facility] [address] Follow-up by: Lavell Islam,  August 11, 2010 11:12 AM    New/Updated Medications: SEPTRA DS 800-160 MG TABS (SULFAMETHOXAZOLE-TRIMETHOPRIM) 1 by mouth two times a day Prescriptions: SEPTRA DS 800-160 MG TABS (SULFAMETHOXAZOLE-TRIMETHOPRIM) 1 by mouth two times a day  #20 x 0   Entered by:   Shonna Chock CMA   Authorized by:   Marga Melnick MD   Signed by:   Shonna Chock CMA on 08/11/2010   Method used:   Electronically to        [facility] (retail)       [phone]       [place], [place]  [address]       Ph: [phone]       Fax: [phone]   RxID:   [id number]   Appended Document: Pharm correction    Phone Note Refill Request   RX WAS SENT TO WRONG PHARM.  Initial call taken by: Lucious Groves CMA,  August 14, 2010 10:02 AM    Prescriptions: SEPTRA DS 800-160 MG TABS (SULFAMETHOXAZOLE-TRIMETHOPRIM) 1 by mouth two times a day  #20 x 0   Entered by:   Lucious Groves CMA   Authorized by:   Marga Melnick MD   Signed by:   Lucious Groves CMA on 08/14/2010   Method used:   Electronically to        [facility] [phone]* (retail)       [address] [place], [place]  [address]       Ph: [phone]       Fax: [phone]   RxID:   [id number]

## 2010-08-24 NOTE — Letter (Signed)
Summary: [facility]  [facility]   Imported By: Lanelle Bal 10/04/2009 14:22:26  _____________________________________________________________________  External Attachment:    Type:   Image     Comment:   External Document

## 2010-08-24 NOTE — Letter (Signed)
Summary: [facility]  [facility]   Imported By: Lanelle Bal 08/17/2009 15:07:02  _____________________________________________________________________  External Attachment:    Type:   Image     Comment:   External Document

## 2010-08-24 NOTE — Letter (Signed)
Summary: [facility]  [facility]   Imported By: Lanelle Bal 06/06/2010 08:43:42  _____________________________________________________________________  External Attachment:    Type:   Image     Comment:   External Document

## 2010-08-24 NOTE — Progress Notes (Signed)
Summary: Pharm denies receiving rx  Phone Note Refill Request Message from:  Patient on July 18, 2010 11:20 AM  Refills Requested: Medication #1:  KLONOPIN 0.5 MG  TABS TAKE ONE TABLET BY MOUTH AT NIGHT  Medication #2:  METFORMIN HCL 1000 MG  TABS TAKE ONE TABLET BY MOUTH TWICE A DAY  Medication #3:  CITALOPRAM HYDROBROMIDE 20 MG TABS 1 once daily  Medication #4:  PRAVASTATIN SODIUM 40 MG  TABS 1 qhs Initial call taken by: Lucious Groves CMA,  July 18, 2010 11:21 AM    Prescriptions: JANUVIA 100 MG TABS (SITAGLIPTIN PHOSPHATE) 1 once daily  #90 x 0   Entered by:   Lucious Groves CMA   Authorized by:   Marga Melnick MD   Signed by:   Lucious Groves CMA on 07/18/2010   Method used:   Printed then faxed to ...       [facility])       [address] [address]       [place], [place]  [address]       Ph: [phone]       Fax: [phone]   RxID:   [id number] CITALOPRAM HYDROBROMIDE 20 MG TABS (CITALOPRAM HYDROBROMIDE) 1 once daily  #90 x 0   Entered by:   Lucious Groves CMA   Authorized by:   Marga Melnick MD   Signed by:   Lucious Groves CMA on 07/18/2010   Method used:   Printed then faxed to ...       [facility])       [address] [address]       [place], [place]  [address]       Ph: [phone]       Fax: [phone]   RxID:   [id number] BENAZEPRIL HCL 20 MG TABS (BENAZEPRIL HCL) 1 qd  #90 x 0   Entered by:   Lucious Groves CMA   Authorized by:   Marga Melnick MD   Signed by:   Lucious Groves CMA on 07/18/2010   Method used:   Printed then faxed to ...       [facility])       [address] [address]       [place], [place]  [address]       Ph: [phone]       Fax: [phone]   RxID:   [id number] PRAVASTATIN SODIUM 40 MG  TABS (PRAVASTATIN SODIUM) 1 qhs  #90 x 0   Entered by:   Lucious Groves CMA   Authorized by:   Marga Melnick MD   Signed by:   Lucious Groves CMA on 07/18/2010   Method used:   Printed then faxed to ...       [facility]  Environmental education officer)       [address] [address]       [place], [place]  [address]       Ph: [phone]       Fax: [phone]   RxID:   [id number] METFORMIN HCL 1000 MG  TABS (METFORMIN HCL) TAKE ONE TABLET BY MOUTH TWICE A DAY  #180 x 0   Entered by:   Lucious Groves CMA   Authorized by:   Marga Melnick MD   Signed by:   Lucious Groves CMA on 07/18/2010   Method used:   Printed then faxed to ...       [facility])       [address] [address]       [place], [place]  [address]  Ph: [phone]       Fax: [phone]   RxID:   [id number] KLONOPIN 0.5 MG  TABS (CLONAZEPAM) TAKE ONE TABLET BY MOUTH AT NIGHT  #90 x 0   Entered by:   Lucious Groves CMA   Authorized by:   Marga Melnick MD   Signed by:   Lucious Groves CMA on 07/18/2010   Method used:   Printed then faxed to ...       [facility] education [facility])       [address] [address]       [place], [place]  [address]       Ph: [phone]       Fax: [phone]   RxID:   [id number]

## 2010-08-24 NOTE — Assessment & Plan Note (Signed)
Summary: med refill/cbs--Rm 5   Vital Signs:  Patient profile:   70 year old female Height:      66.5 inches Weight:      190 pounds BMI:     30.32 Temp:     98.2 degrees F oral Pulse rate:   78 / minute Pulse rhythm:   regular Resp:     16 per minute BP sitting:   130 / 86  (left arm) Cuff size:   regular  Vitals Entered By: Mervin Kung CMA Duncan Dull) (January 24, 2010 2:15 PM) CC: Room 5  Needs refills on Klonopin, Metformin, Pravastatin, Veramyst Benazepril and Citalopram.  Also having dysuria., Type 2 diabetes mellitus follow-up Is Patient Diabetic? Yes   CC:  Room 5  Needs refills on Klonopin, Metformin, Pravastatin, Veramyst Benazepril and Citalopram.  Also having dysuria., and Type 2 diabetes mellitus follow-up.  History of Present Illness:  Hyperlipidemia Follow-Up      This is a 70 year old woman who presents for Hyperlipidemia follow-up.  The patient reports fatigue for > 12 months , but denies muscle aches, GI upset, abdominal pain, flushing, itching, constipation, and diarrhea.  The patient denies the following symptoms: chest pain/pressure, exercise intolerance, dypsnea, palpitations, syncope, and pedal edema.  Compliance with medications (by patient report) has been near 100%.  Dietary compliance has been good.  The patient reports exercising daily as walking 1 mpd.  Adjunctive measures currently used by the patient include ASA.   Type 2 Diabetes Mellitus Follow-Up      The patient is also here for Type 2 diabetes mellitus follow-up.  The patient reports weight loss of 5 #, but denies polyuria, polydipsia, blurred vision, self managed hypoglycemia, and numbness of extremities.  Other symptoms include occasional orthostatic symptoms.  The patient denies the following symptoms: neuropathic pain, vomiting, poor wound healing, intermittent claudication, vision loss, and foot ulcer.  Since the last visit the patient reports monitoring blood glucose.  The patient has been measuring  capillary blood glucose before breakfast; FBS 180-200 recently off Actos.  Since the last visit, the patient reports having had eye care by an ophthalmologist and no foot care.  Complications from diabetes include peripheral neuropathy.  Hypertension Follow-Up      The patient also presents for Hypertension follow-up.She is not monitoring BP @ home  The patient reports occasional occipital  headaches,? related to stress & clinching jaw.  Compliance with medications (by patient report) has been near 100%.  Adjunctive measures currently used by the patient include salt restriction.    Allergies: 1)  ! Premarin  Review of Systems General:  Denies chills, fever, and sweats. ENT:  Denies difficulty swallowing and hoarseness. GU:  Denies discharge and hematuria; Dysuria X 7 days; no Rx. Chronic incontinence X years; pad worn . Neuro:  Sciatica RLE treated by Dr Claudine Mouton , Chiropractor with benefit.  Physical Exam  General:  well-nourished,in no acute distress; alert,appropriate and cooperative throughout examination Eyes:  No corneal or conjunctival inflammation noted.Perrla. No lid lag  Neck:  Nodule R lobe Lungs:  Normal respiratory effort, chest expands symmetrically. Lungs are clear to auscultation, no crackles or wheezes. Heart:  Normal rate and regular rhythm. S1 and S2 normal without gallop, murmur, click, rub . S4 with slurring Abdomen:  Bowel sounds positive,abdomen soft and non-tender without masses, organomegaly or hernias noted. Pulses:  R and L carotid,radial,dorsalis pedis and posterior tibial pulses are full and equal bilaterally Extremities:  No clubbing, cyanosis, edema. Neurologic:  alert & oriented X3, strength normal in all extremities, sensation intact to light touch over feet , and DTRs symmetrical and normal.   Skin:  Intact without suspicious lesions or rashes Cervical Nodes:  No lymphadenopathy noted Axillary Nodes:  No palpable lymphadenopathy Psych:  memory intact for  recent and remote, normally interactive, and good eye contact.     Impression & Recommendations:  Problem # 1:  DM (ICD-250.00) ? control Her updated medication list for this problem includes:    Metformin Hcl 1000 Mg Tabs (Metformin hcl) .Marland Kitchen... Take one tablet by mouth twice a day    Benazepril Hcl 20 Mg Tabs (Benazepril hcl) .Marland Kitchen... 1 qd  Problem # 2:  HYPERTENSION, ESSENTIAL NOS (ICD-401.9) controlled Her updated medication list for this problem includes:    Benazepril Hcl 20 Mg Tabs (Benazepril hcl) .Marland Kitchen... 1 qd  Problem # 3:  THYROID NODULE, RIGHT (ICD-241.0)  Orders: Radiology Referral (Radiology)  Problem # 4:  HYPERLIPIDEMIA (ICD-272.2)  Her updated medication list for this problem includes:    Pravastatin Sodium 40 Mg Tabs (Pravastatin sodium) .Marland Kitchen... 1 qhs  Problem # 5:  DYSURIA (ICD-788.1)  The following medications were removed from the medication list:    Ciprofloxacin Hcl 500 Mg Tabs (Ciprofloxacin hcl) .Marland Kitchen... 1 two times a day  Orders: T-Culture, Urine ([id number])  Complete Medication List: 1)  Caltrate With Vitamin D  .... Once daily 2)  Klonopin 0.5 Mg Tabs (Clonazepam) .... Take one tablet by mouth at night 3)  Metformin Hcl 1000 Mg Tabs (Metformin hcl) .... Take one tablet by mouth twice a day 4)  Baby Asa  .... Once daily 5)  Pravastatin Sodium 40 Mg Tabs (Pravastatin sodium) .Marland Kitchen.. 1 qhs 6)  Freestyle Lite Test Strp (Glucose blood) .... Use as directed 7)  Veramyst 27.5 Mcg/spray Susp (Fluticasone furoate) .... 2 sprays each nostril once daily 8)  Benazepril Hcl 20 Mg Tabs (Benazepril hcl) .Marland Kitchen.. 1 qd 9)  Vit-d 600mg   .... 1 by mouth once daily 10)  Citalopram Hydrobromide 20 Mg Tabs (Citalopram hydrobromide) .Marland Kitchen.. 1 once daily 11)  Phenazo 200 Mg Tabs (Phenazopyridine hcl) .Marland Kitchen.. 1 three times a day as needed  Other Orders: UA Dipstick w/o Micro (manual) (14782)  Patient Instructions: 1)  Schedule fasting Labs : vit D level: 268.9; 2)  BMP prior to visit,  ICD-9:401.9 3)  Hepatic Panel prior to visit, ICD-9:995.20 4)  Lipid Panel prior to visit, ICD-9:272.4 5)  TSH prior to visit, ICD-9:241.0 6)  CBC w/ Diff prior to visit, ICD-9:780.79 7)  HbgA1C prior to visit, ICD-9:250.00 8)  Urine Microalbumin prior to visit, ICD-9:250.00 Prescriptions: CITALOPRAM HYDROBROMIDE 20 MG TABS (CITALOPRAM HYDROBROMIDE) 1 once daily  #90 x 1   Entered and Authorized by:   Marga Melnick MD   Signed by:   Marga Melnick MD on 01/24/2010   Method used:   Printed then faxed to ...       [facility]  [address] [phone]* (retail)       [address]       [place], [place]  [address]       Ph: [phone]       Fax: [phone]   RxID:   [id number] BENAZEPRIL HCL 20 MG TABS (BENAZEPRIL HCL) 1 qd  #90 x 1   Entered and Authorized by:   Marga Melnick MD   Signed by:   Marga Melnick MD on 01/24/2010   Method used:  Printed then faxed to ...       [facility]  [address] [phone]* (retail)       [address]       [place], [place]  [address]       Ph: [phone]       Fax: [phone]   RxID:   [id number] PRAVASTATIN SODIUM 40 MG  TABS (PRAVASTATIN SODIUM) 1 qhs  #90 x 1   Entered and Authorized by:   Marga Melnick MD   Signed by:   Marga Melnick MD on 01/24/2010   Method used:   Printed then faxed to ...       [facility]  [address] [phone]* (retail)       [address]       [place], [place]  [address]       Ph: [phone]       Fax: [phone]   RxID:   [id number] KLONOPIN 0.5 MG  TABS (CLONAZEPAM) TAKE ONE TABLET BY MOUTH AT NIGHT  #90 x 1   Entered and Authorized by:   Marga Melnick MD   Signed by:   Marga Melnick MD on 01/24/2010   Method used:   Printed then faxed to ...       [facility]  [address] [phone]* (retail)       [address]       [place], [place]  [address]       Ph: [phone]       Fax: [phone]   RxID:    [id number]   Current Allergies (reviewed today): ! PREMARIN  Laboratory Results   Urine Tests  Date/Time Received: 01-24-10 Date/Time Reported: Mervin Kung CMA Duncan Dull)  January 24, 2010 2:31 PM   Routine Urinalysis   Color: yellow Appearance: Clear Glucose: negative   (Normal Range: Negative) Bilirubin: negative   (Normal Range: Negative) Ketone: negative   (Normal Range: Negative) Spec. Gravity: 1.015   (Normal Range: 1.003-1.035) Blood: trace-lysed   (Normal Range: Negative) pH: 6.0   (Normal Range: 5.0-8.0) Protein: negative   (Normal Range: Negative) Urobilinogen: 0.2   (Normal Range: 0-1) Nitrite: negative   (Normal Range: Negative) Leukocyte Esterace: small   (Normal Range: Negative)

## 2010-08-24 NOTE — Assessment & Plan Note (Signed)
Summary: [name]- RIGHT SIDE NUMBNESS/KN   Vital Signs:  Patient profile:   71 year old female Weight:      185.8 pounds BMI:     29.65 Temp:     97.8 degrees F oral Pulse rate:   60 / minute Resp:     17 per minute BP sitting:   140 / 72  (left arm) Cuff size:   large  Vitals Entered By: Shonna Chock CMA (May 31, 2010 1:13 PM) CC: Walk- in right side numbness ( patient thinks its related to recent back problems), symptoms of TIA, Type 2 diabetes mellitus follow-up   CC:  Walk- in right side numbness ( patient thinks its related to recent back problems), symptoms of TIA, and Type 2 diabetes mellitus follow-up.  History of Present Illness:      This is a 70 year old female who presents with symptoms of TIA/CVA since 11/08 /2011 @ 8am. Initially she had difficulty walking due to weakness in RLE , slight dizziness & unsteady gait. "I had to hold onto wall or furniture to keep from falling".  This numbness/ weakness is located on entire R side right side of face,  arm, and right leg.  The patient denies  headache , nausea,&vomiting Patient states the   these symptoms have decreased in RLE minimally over the  last over 30 hours.  The patient reports the following risk factors; High Blood Pressure and DM.      The patient denies polyuria, polydipsia, and self managed hypoglycemia.  Other symptoms include orthostatic symptoms 11/08 upon arising.  The patient denies the following symptoms: chest pain, poor wound healing, and foot ulcer.  The patient has been measuring capillary blood glucose before breakfast, range 119-140.       The patient reports lightheadedness and fatigue since 11/08.  The patient denies the following associated symptoms: chest pain, chest pressure, dyspnea, palpitations, and syncope.  BP @ home not monitored.  Current Medications (verified): 1)  Caltrate With Vitamin D .... Once Daily 2)  Klonopin 0.5 Mg  Tabs (Clonazepam) .... Take One Tablet By Mouth At Night 3)   Metformin Hcl 1000 Mg  Tabs (Metformin Hcl) .... Take One Tablet By Mouth Twice A Day 4)  Baby Asa .... Once Daily 5)  Pravastatin Sodium 40 Mg  Tabs (Pravastatin Sodium) .Marland Kitchen.. 1 Qhs 6)  Freestyle Lite Test   Strp (Glucose Blood) .... Use As Directed 7)  Veramyst 27.5 Mcg/spray  Susp (Fluticasone Furoate) .... 2 Sprays Each Nostril Once Daily 8)  Benazepril Hcl 20 Mg Tabs (Benazepril Hcl) .Marland Kitchen.. 1 Qd 9)  Vit-D 600mg  .... 1 By Mouth Once Daily 10)  Citalopram Hydrobromide 20 Mg Tabs (Citalopram Hydrobromide) .Marland Kitchen.. 1 Once Daily  Allergies: 1)  ! Premarin  Review of Systems Eyes:  Denies blurring, double vision, and vision loss-both eyes. ENT:  Denies decreased hearing, difficulty swallowing, and hoarseness. GU:  Denies incontinence. Derm:  Complains of rash; denies lesion(s); R flank rash as of last night. Neuro:  Denies difficulty with concentration, inability to speak, and memory loss.  Physical Exam  General:  well-nourished,in no acute distress; alert,appropriate and cooperative throughout examination Eyes:  No corneal or conjunctival inflammation noted. EOMI. Perrla. Foeld of  Vision grossly normal. Slight ptosis OD > OS Ears:  External ear exam shows no significant lesions or deformities.  Otoscopic examination reveals clear canals, tympanic membranes are intact bilaterally without bulging, retraction, inflammation or discharge. Hearing is grossly normal bilaterally. Nose:  External nasal examination shows no deformity or inflammation. Nasal mucosa are pink and moist without lesions or exudates. Mouth:  Oral mucosa and oropharynx without lesions or exudates.  Teeth in good repair. No tongue deviation Lungs:  Normal respiratory effort, chest expands symmetrically. Lungs are clear to auscultation, no crackles or wheezes. Heart:  normal rate, regular rhythm, no gallop, no rub, no JVD, and grade 1 /6 systolic murmur.   Abdomen:  Bowel sounds positive,abdomen soft and non-tender without  masses, organomegaly or hernias noted. Pulses:  R and L carotid,radial,femoral,dorsalis pedis and posterior tibial pulses are full and equal bilaterally Extremities:  No clubbing, cyanosis, edema. Neurologic:  alert & oriented X3, cranial nerves II-XII intact , strength  decreased R thigh, sensation decreased  to light touch R face, gait abnorma : slight limp RLEl, and DTRs asymmetrical ; decreased RLE Skin:  Faint erythema R flank 7.5 X 2.5 cm with excoriations Cervical Nodes:  No lymphadenopathy noted Axillary Nodes:  No palpable lymphadenopathy Psych:  memory intact for recent and remote, normally interactive, good eye contact, not anxious appearing, and not depressed appearing.     Impression & Recommendations:  Problem # 1:  C V A/STROKE (ICD-434.91)  Orders: Venipuncture (09811) Specimen Handling (91478) Radiology Referral (Radiology) TLB-BMP (Basic Metabolic Panel-BMET) (80048-METABOL) TLB-CBC Platelet - w/Differential (85025-CBCD)  Problem # 2:  DM (ICD-250.00)  Her updated medication list for this problem includes:    Metformin Hcl 1000 Mg Tabs (Metformin hcl) .Marland Kitchen... Take one tablet by mouth twice a day    Benazepril Hcl 20 Mg Tabs (Benazepril hcl) .Marland Kitchen... 1 qd    Januvia 100 Mg Tabs (Sitagliptin phosphate) .Marland Kitchen... 1 once daily  Orders: Venipuncture (29562) Radiology Referral (Radiology) TLB-A1C / Hgb A1C (Glycohemoglobin) (83036-A1C)  Problem # 3:  HYPERTENSION, ESSENTIAL NOS (ICD-401.9)  Her updated medication list for this problem includes:    Benazepril Hcl 20 Mg Tabs (Benazepril hcl) .Marland Kitchen... 1 qd  Orders: Venipuncture (13086) Radiology Referral (Radiology) TLB-BMP (Basic Metabolic Panel-BMET) (80048-METABOL)  Problem # 4:  HYPERLIPIDEMIA (ICD-272.2)  Her updated medication list for this problem includes:    Pravastatin Sodium 40 Mg Tabs (Pravastatin sodium) .Marland Kitchen... 1 qhs  Orders: Venipuncture (57846) Specimen Handling (96295) Radiology Referral  (Radiology) TLB-Hepatic/Liver Function Pnl (80076-HEPATIC)  Complete Medication List: 1)  Caltrate With Vitamin D  .... Once daily 2)  Klonopin 0.5 Mg Tabs (Clonazepam) .... Take one tablet by mouth at night 3)  Metformin Hcl 1000 Mg Tabs (Metformin hcl) .... Take one tablet by mouth twice a day 4)  Baby Asa  .... Once daily 5)  Pravastatin Sodium 40 Mg Tabs (Pravastatin sodium) .Marland Kitchen.. 1 qhs 6)  Freestyle Lite Test Strp (Glucose blood) .... Use as directed 7)  Veramyst 27.5 Mcg/spray Susp (Fluticasone furoate) .... 2 sprays each nostril once daily 8)  Benazepril Hcl 20 Mg Tabs (Benazepril hcl) .Marland Kitchen.. 1 qd 9)  Vit-d 600mg   .... 1 by mouth once daily 10)  Citalopram Hydrobromide 20 Mg Tabs (Citalopram hydrobromide) .Marland Kitchen.. 1 once daily 11)  Medrol 2 Mg Tabs (Methylprednisolone) .Marland Kitchen.. 1 two times a day 12)  Januvia 100 Mg Tabs (Sitagliptin phosphate) .Marland Kitchen.. 1 once daily 13)  Blood Pressure Cuff Misc (Blood pressure monitoring) .... Check bloodpressure 1-2 x daily, dx: 434.91  Patient Instructions: 1)  Hold Metformin. Increase 81 mg aspirin to FOUR daily .To ER with this record if symptoms progress.   Orders Added: 1)  Est. Patient Level IV [28413] 2)  Venipuncture [24401] 3)  Specimen Handling [  99000] 4)  Radiology Referral [Radiology] 5)  TLB-BMP (Basic Metabolic Panel-BMET) [80048-METABOL] 6)  TLB-CBC Platelet - w/Differential [85025-CBCD] 7)  TLB-Hepatic/Liver Function Pnl [80076-HEPATIC] 8)  TLB-A1C / Hgb A1C (Glycohemoglobin) [83036-A1C]

## 2010-08-24 NOTE — Assessment & Plan Note (Signed)
Summary: BLADDER INF?///SPH   Vital Signs:  Patient profile:   70 year old female Weight:      187.8 pounds BMI:     29.97 Temp:     98.2 degrees F oral Pulse rate:   72 / minute Resp:     14 per minute BP sitting:   118 / 76  (left arm) Cuff size:   regular  Vitals Entered By: Shonna Chock CMA (April 11, 2010 2:15 PM) CC: ? Bladder Infection: Burning and frequency, Dysuria   CC:  ? Bladder Infection: Burning and frequency and Dysuria.  History of Present Illness: Dysuria      This is a 70 year old woman who presents with Dysuria X 1 week.  The patient reports burning with urination, urinary frequency, and urgency, but denies hematuria and vaginal discharge.  Associated symptoms include minor low  back pain and pelvic pain.  The patient denies the following associated symptoms: nausea, vomiting, fever, shaking chills, flank pain, and abdominal pain.  Risk factors for urinary tract infection include diabetes.  History is significant for > 3 UTIs in one year.  Rx: none. FH of bladder cancer in mother.PMH of MRSA; no recurrences.FBS not monitored regularly.  Current Medications (verified): 1)  Caltrate With Vitamin D .... Once Daily 2)  Klonopin 0.5 Mg  Tabs (Clonazepam) .... Take One Tablet By Mouth At Night 3)  Metformin Hcl 1000 Mg  Tabs (Metformin Hcl) .... Take One Tablet By Mouth Twice A Day 4)  Baby Asa .... Once Daily 5)  Pravastatin Sodium 40 Mg  Tabs (Pravastatin Sodium) .Marland Kitchen.. 1 Qhs 6)  Freestyle Lite Test   Strp (Glucose Blood) .... Use As Directed 7)  Veramyst 27.5 Mcg/spray  Susp (Fluticasone Furoate) .... 2 Sprays Each Nostril Once Daily 8)  Benazepril Hcl 20 Mg Tabs (Benazepril Hcl) .Marland Kitchen.. 1 Qd 9)  Vit-D 600mg  .... 1 By Mouth Once Daily 10)  Citalopram Hydrobromide 20 Mg Tabs (Citalopram Hydrobromide) .Marland Kitchen.. 1 Once Daily  Allergies: 1)  ! Premarin  Physical Exam  General:  well-nourished,in no acute distress; alert,appropriate and cooperative throughout  examination Abdomen:  Bowel sounds positive,abdomen soft and non-tender without masses, organomegaly or hernias noted. Msk:  Minor discomfort to percussion lower spine. Lay down & sat upw/o help. Neg SLR Neurologic:  alert & oriented X3, strength normal in all extremities, gait normal, and DTRs symmetrical and normal.   Skin:  Intact without suspicious lesions or rashes   Impression & Recommendations:  Problem # 1:  DYSURIA (ICD-788.1)  The following medications were removed from the medication list:    Cipro 500 Mg Tabs (Ciprofloxacin hcl) .Marland Kitchen... 1 by mouth two times a day Her updated medication list for this problem includes:    Smz-tmp Ds 800-160 Mg Tabs (Sulfamethoxazole-trimethoprim) .Marland Kitchen... 1 two times a day with 8 oz water  Orders: UA Dipstick w/o Micro (manual) (16109) Specimen Handling (99000) T-Culture, Urine ([id number]) Prescription Created Electronically [phone])  Problem # 2:  DM (ICD-250.00) ? control Her updated medication list for this problem includes:    Metformin Hcl 1000 Mg Tabs (Metformin hcl) .Marland Kitchen... Take one tablet by mouth twice a day    Benazepril Hcl 20 Mg Tabs (Benazepril hcl) .Marland Kitchen... 1 qd  Complete Medication List: 1)  Caltrate With Vitamin D  .... Once daily 2)  Klonopin 0.5 Mg Tabs (Clonazepam) .... Take one tablet by mouth at night 3)  Metformin Hcl 1000 Mg Tabs (Metformin hcl) .... Take one tablet by mouth twice  a day 4)  Baby Asa  .... Once daily 5)  Pravastatin Sodium 40 Mg Tabs (Pravastatin sodium) .Marland Kitchen.. 1 qhs 6)  Freestyle Lite Test Strp (Glucose blood) .... Use as directed 7)  Veramyst 27.5 Mcg/spray Susp (Fluticasone furoate) .... 2 sprays each nostril once daily 8)  Benazepril Hcl 20 Mg Tabs (Benazepril hcl) .Marland Kitchen.. 1 qd 9)  Vit-d 600mg   .... 1 by mouth once daily 10)  Citalopram Hydrobromide 20 Mg Tabs (Citalopram hydrobromide) .Marland Kitchen.. 1 once daily 11)  Smz-tmp Ds 800-160 Mg Tabs (Sulfamethoxazole-trimethoprim) .Marland Kitchen.. 1 two times a day with 8 oz  water 12)  Phenazopyridine Hcl 100 Mg Tabs (Phenazopyridine hcl) .Marland Kitchen.. 1 three times a day as needed  for burning  Patient Instructions: 1)  Drink as much NON dairy  fluid as you can tolerate for the next few days. 2)  Check your blood sugars regularly. If your readings are usually above : 150 or below 90  ON AVERAGE you should contact our office. Prescriptions: PHENAZOPYRIDINE HCL 100 MG TABS (PHENAZOPYRIDINE HCL) 1 three times a day as needed  for burning  #15 x 0   Entered and Authorized by:   Marga Melnick MD   Signed by:   Marga Melnick MD on 04/11/2010   Method used:   Faxed to ...       [facility] [address] [facility] [phone]* (retail)       [address] [place], [place]  [address]       Ph: [phone]       Fax: [phone]   RxID:   [id number] SMZ-TMP DS 800-160 MG TABS (SULFAMETHOXAZOLE-TRIMETHOPRIM) 1 two times a day with 8 oz water  #20 x 0   Entered and Authorized by:   Marga Melnick MD   Signed by:   Marga Melnick MD on 04/11/2010   Method used:   Faxed to ...       [facility] [address] [facility] [phone]* (retail)       [address] [place], [place]  [address]       Ph: [phone]       Fax: [phone]   RxID:   [id number]    Laboratory Results   Urine Tests    Routine Urinalysis   Color: yellow Appearance: Cloudy Glucose: negative   (Normal Range: Negative) Bilirubin: negative   (Normal Range: Negative) Ketone: negative   (Normal Range: Negative) Spec. Gravity: 1.010   (Normal Range: 1.003-1.035) Blood: negative   (Normal Range: Negative) pH: 6.0   (Normal Range: 5.0-8.0) Protein: negative   (Normal Range: Negative) Urobilinogen: 0.2   (Normal Range: 0-1) Nitrite: negative   (Normal Range: Negative) Leukocyte Esterace: trace   (Normal Range: Negative)    Comments: sent for culture     Appended Document: BLADDER INF?///SPH Flu Vaccine Consent Questions     Do you have a history of severe allergic reactions to this vaccine? no    Any  prior history of allergic reactions to egg and/or gelatin? no    Do you have a sensitivity to the preservative Thimersol? no    Do you have a past history of Guillan-Barre Syndrome? no    Do you currently have an acute febrile illness? no    Have you ever had a severe reaction to latex? no    Vaccine information given and explained to patient? yes    Are you currently pregnant? no  Lot Number:[id number]   Exp Date:01/20/2011   Site Given Right Deltoid IM

## 2010-08-24 NOTE — Letter (Signed)
Summary: [facility]  [facility]   Imported By: Lanelle Bal 06/23/2010 11:10:26  _____________________________________________________________________  External Attachment:    Type:   Image     Comment:   External Document

## 2010-08-24 NOTE — Assessment & Plan Note (Signed)
Summary: uti//??lch   Vital Signs:  Patient profile:   70 year old female Weight:      201 pounds Temp:     98.5 degrees F oral Pulse rate:   80 / minute Resp:     17 per minute BP sitting:   130 / 78  (left arm)  Vitals Entered By: [name] (October 11, 2009 2:16 PM) CC: uti, frequency, burning Comments REVIEWED MED LIST, PATIENT AGREED DOSE AND INSTRUCTION CORRECT    CC:  uti, frequency, and burning.  History of Present Illness: Onset 10/08/2009 as frequency & nocturia , followed by dysuria last night. Rx: none . PMH of  UTIs. FBS not checked recently, but FBS 201 post back surgery.  Allergies: 1)  ! Premarin  Past History:  Past Surgical History: Hysterectomy for endometriosis carpal tunnel surgery  RUE 2000 G2 P2 ARTHROSCOPY 2003 KNEE SURGERY (ARTHROSCOPY) 2006, DVT  Total knee replacement L 2003 & R 2009 (03/30/08), Dr [name] Colonoscopy neg ? 2007 Lumbar laminectomy @ L4-5, Dr [name] 08/25/2009  Review of Systems General:  Denies chills, fever, and sweats. GU:  Complains of urinary hesitancy; denies discharge and hematuria. MS:  Complains of low back pain. Derm:  Denies poor wound healing. Neuro:  Denies numbness and tingling; No radicular pain since sugery. Endo:  Denies excessive hunger, excessive thirst, and excessive urination.  Physical Exam  General:  in no acute distress; alert,appropriate and cooperative throughout examination Abdomen:  Bowel sounds positive,abdomen soft but minimally tender  suprapubically without masses, organomegaly or hernias noted. Msk:  Op scar healing; slight tenderness @ flanks to percussion Extremities:  Negative  SLR  Skin:  Intact without suspicious lesions or rashes Psych:  memory intact for recent and remote, normally interactive, and good eye contact.     Impression & Recommendations:  Problem # 1:  UTI (ICD-599.0)  Orders: T-Culture, Urine ([address]-60454) TLB-Udip ONLY (81003-UDIP) Prescription  Created Electronically [phone])  Her updated medication list for this problem includes:    Ciprofloxacin Hcl 500 Mg Tabs (Ciprofloxacin hcl) .Marland Kitchen... 1 two times a day  Problem # 2:  DIABETES MELLITUS, CONTROLLED (ICD-250.00)  Her updated medication list for this problem includes:    Metformin Hcl 1000 Mg Tabs (Metformin hcl) .Marland Kitchen... Take one tablet by mouth twice a day    Actos 30 Mg Tabs (Pioglitazone hcl) .Marland Kitchen... 1 daily with a meal    Benazepril Hcl 20 Mg Tabs (Benazepril hcl) .Marland Kitchen... 1 qd  Orders: Venipuncture (91478) TLB-A1C / Hgb A1C (Glycohemoglobin) (83036-A1C)  Complete Medication List: 1)  Caltrate With Vitamin D  .... Once daily 2)  Klonopin 0.5 Mg Tabs (Clonazepam) .... Take one tablet by mouth at night 3)  Metformin Hcl 1000 Mg Tabs (Metformin hcl) .... Take one tablet by mouth twice a day 4)  Baby Asa  .... Once daily 5)  Pravastatin Sodium 40 Mg Tabs (Pravastatin sodium) .Marland Kitchen.. 1 qhs 6)  Freestyle Lite Test Strp (Glucose blood) .... Use as directed 7)  Veramyst 27.5 Mcg/spray Susp (Fluticasone furoate) .... 2 sprays each nostril once daily 8)  Actos 30 Mg Tabs (Pioglitazone hcl) .Marland Kitchen.. 1 daily with a meal 9)  Benazepril Hcl 20 Mg Tabs (Benazepril hcl) .Marland Kitchen.. 1 qd 10)  Vit-d 600mg   .... 1 by mouth once daily 11)  Citalopram Hydrobromide 20 Mg Tabs (Citalopram hydrobromide) .Marland Kitchen.. 1 once daily 12)  Ciprofloxacin Hcl 500 Mg Tabs (Ciprofloxacin hcl) .Marland Kitchen.. 1 two times a day 13)  Phenazo 200 Mg Tabs (Phenazopyridine hcl) .Marland KitchenMarland KitchenMarland Kitchen  1 three times a day as needed  Patient Instructions: 1)  Drink as much fluid as you can tolerate for the next few days. Prescriptions: PHENAZO 200 MG TABS (PHENAZOPYRIDINE HCL) 1 three times a day as needed  #15 x 0   Entered and Authorized by:   [name]   Signed by:   [name] on 10/11/2009   Method used:   Faxed to ...       [facility] [address] [facility] [phone]* (retail)       [address] [place], [place]  [address]       Ph: [phone]        Fax: [phone]   RxID:   [id number] CIPROFLOXACIN HCL 500 MG TABS (CIPROFLOXACIN HCL) 1 two times a day  #20 x 0   Entered and Authorized by:   [name]   Signed by:   [name] on 10/11/2009   Method used:   Faxed to ...       [facility] [address] [facility] [phone]* (retail)       [address] [place], [place]  [address]       Ph: [phone]       Fax: [phone]   RxID:   [id number]   Appended Document: uti//??lch  Laboratory Results   Urine Tests   Date/Time Reported: October 11, 2009 3:20 PM   Routine Urinalysis   Color: yellow Appearance: Clear Glucose: negative   (Normal Range: Negative) Bilirubin: negative   (Normal Range: Negative) Ketone: negative   (Normal Range: Negative) Spec. Gravity: <1.005   (Normal Range: 1.003-1.035) Blood: negative   (Normal Range: Negative) pH: 6.0   (Normal Range: 5.0-8.0) Protein: trace   (Normal Range: Negative) Urobilinogen: negative   (Normal Range: 0-1) Nitrite: negative   (Normal Range: Negative) Leukocyte Esterace: moderate   (Normal Range: Negative)    Comments: [name]  October 11, 2009 3:21 PM cx sent

## 2010-08-24 NOTE — Progress Notes (Signed)
Summary: Culture Results  Phone Note Outgoing Call Call back at [facility] Phone [phone]   Call placed by: Shonna Chock,  October 14, 2009 11:28 AM Call placed to: Patient Summary of Call: Spoke with patient:  Cipro will eradicate the UTI;please complete full course.  Patient ok'd information./[name]  October 14, 2009 11:28 AM

## 2010-09-07 ENCOUNTER — Encounter: Payer: Self-pay | Admitting: Internal Medicine

## 2010-09-19 NOTE — Letter (Signed)
Summary: [facility]  [facility]   Imported By: Maryln Gottron 09/14/2010 09:00:50  _____________________________________________________________________  External Attachment:    Type:   Image     Comment:   External Document

## 2010-09-29 ENCOUNTER — Encounter: Payer: Self-pay | Admitting: Internal Medicine

## 2010-09-29 ENCOUNTER — Ambulatory Visit (INDEPENDENT_AMBULATORY_CARE_PROVIDER_SITE_OTHER): Payer: Medicare Other | Admitting: Internal Medicine

## 2010-09-29 DIAGNOSIS — I6350 Cerebral infarction due to unspecified occlusion or stenosis of unspecified cerebral artery: Secondary | ICD-10-CM

## 2010-09-29 DIAGNOSIS — E782 Mixed hyperlipidemia: Secondary | ICD-10-CM

## 2010-09-29 DIAGNOSIS — E041 Nontoxic single thyroid nodule: Secondary | ICD-10-CM

## 2010-09-29 DIAGNOSIS — Z23 Encounter for immunization: Secondary | ICD-10-CM

## 2010-09-29 DIAGNOSIS — I10 Essential (primary) hypertension: Secondary | ICD-10-CM

## 2010-09-29 DIAGNOSIS — E119 Type 2 diabetes mellitus without complications: Secondary | ICD-10-CM

## 2010-09-29 DIAGNOSIS — Z0000 Encounter for general adult medical examination without abnormal findings: Secondary | ICD-10-CM

## 2010-10-03 NOTE — Assessment & Plan Note (Signed)
Summary: med refill  for numerous meds///sph   Vital Signs:  Patient profile:   70 year old female Height:      66.25 inches Weight:      181 pounds BMI:     29.10 Temp:     98.4 degrees F oral Pulse rate:   64 / minute Resp:     14 per minute BP sitting:   116 / 80  (left arm) Cuff size:   large  Vitals Entered By: Shonna Chock CMA (September 29, 2010 10:19 AM) CC: Yearly follow-up on meds: [facility] all meds [facility], Type 2 diabetes mellitus follow-up   CC:  Yearly follow-up on meds: Refill all meds [facility] and Type 2 diabetes mellitus follow-up.  History of Present Illness: Here for Medicare AWV: 1.Risk factors based on Past M, S, F history:see Diagnoses ; chart updated 2.Physical Activities: walking, water aerobics 3X/ week 30-60 min 3.Depression/mood: no 4.Hearing: normal to whisper @ 6 ft 5.ADL's: no limitations 6.Fall Risk: imbalance  only when tired since cns event 7.Home Safety: safety proofed 8.Height, weight, &visual acuity:see VS 9.Counseling: POA & Living Will in plave 10.Labs ordered based on risk factors: see Orders 11. Referral Coordination: none requested; Gyn seen 07/11; mammograms 11/11 12. Care Plan: see Instructions 13. Cognitive Assessment: Oriented  X3 ; memory & recall  normal  ; "WORLD"   spelled backwards; mood & affect normal.    Hyperlipidemia Follow-Up:She  denies muscle aches, GI upset, abdominal pain, flushing, itching, constipation, diarrhea,  fatigue, chest pain/pressure, exercise intolerance, dypsnea, palpitations, syncope, and pedal edema.  Compliance with medications  has been near 100%.  Dietary compliance has been good.  Adjunctive measures currently used by the patient include ASA.      Hypertension Follow-Up:She reports urinary frequency, but denies headaches. Dr Aldean Ast did Cystoscopy ; initial cytology was abnormal, F/U normal. Compliance with medicationshas been near 100%.  Adjunctive measures  currently used by the patient include salt restriction. BP 130/70 @ home.     Type 2 Diabetes Mellitus Follow-Up:She  reports  weight loss of 15-20# and numbness of R hand since CVA, but denies polydipsia, blurred vision,  hypoglycemia,: neuropathic pain, poor wound healing, intermittent claudication, vision loss, and foot ulcer.  Since the last visit the patient reports monitoring blood glucose.  The patient has been measuring capillary blood glucose before breakfast.  Since the last visit, the patient reports having had no eye care.    Preventive Screening-Counseling & Management  Alcohol-Tobacco     Alcohol drinks/day: 0     Smoking Status: never  Caffeine-Diet-Exercise     Caffeine use/day: 3-4 decaf ; 1 cup tea  Hep-HIV-STD-Contraception     Dental Visit-last 6 months yes     Sun Exposure-Excessive: no  Safety-Violence-Falls     Seat Belt Use: yes     Smoke Detectors: yes      Blood Transfusions:  no.        Travel History:  2008 [place].    Current Medications (verified): 1)  Caltrate With Vitamin D .... Once Daily 2)  Klonopin 0.5 Mg  Tabs (Clonazepam) .... Take One Tablet By Mouth At Night 3)  Metformin Hcl 1000 Mg  Tabs (Metformin Hcl) .... Take One Tablet By Mouth Twice A Day 4)  Baby Asa .... Once Daily 5)  Pravastatin Sodium 40 Mg  Tabs (Pravastatin Sodium) .Marland Kitchen.. 1 Qhs 6)  Freestyle Lite Test   Strp (Glucose  Blood) .... Use As Directed 7)  Benazepril Hcl 20 Mg Tabs (Benazepril Hcl) .Marland Kitchen.. 1 Qd 8)  Vit-D 600mg  .... 1 By Mouth Once Daily 9)  Citalopram Hydrobromide 20 Mg Tabs (Citalopram Hydrobromide) .Marland Kitchen.. 1 Once Daily 10)  Januvia 100 Mg Tabs (Sitagliptin Phosphate) .Marland Kitchen.. 1 Once Daily  Allergies: 1)  ! Premarin  Past History:  Past Medical History: Diabetes mellitus, type II Redux therapy, PMH of Osteoarthritis Hyperlipidemia: LDL goal =< 100, ideally < 70 in context of DM. NMR Lipoprofile 2006: LDL 98(1524/1059), HDL 32,TG 98. ? R  thyroid nodule on CT of head  08/24/2008; MRSA , PMH of   Past Surgical History: Hysterectomy for Endometriosis, no BSO  Carpal tunnel surgery  RUE 2000 G2 P2 ARTHROSCOPY 2003 KNEE SURGERY (ARTHROSCOPY) 2006, DVT post op  Total knee replacement L 2003 & R 2009 (03/30/2008), Dr Cleophas Dunker; MRSA post op 2009 Colonoscopy negative   2005, Dr Juanda Chance, due 2015 Lumbar laminectomy @ L4-5, Dr Jeral Fruit 08/25/2009 Cystoscopy 06/2010 & 02 /2012 , Dr Aldean Ast  Family History: Father: ASCVD Mother: CANCER  OF BLADDER , DIABETES Siblings: SISTER :BRAIN CANCER, DIED; SISTER:  DM TYPE 1 DIED AGE 13;  BROTHER: PROSTATE CANCER, [place]'s MATERNAL UNCLES AND AUNTS :DM MATERNAL GRANDPARENTS: DM TYPE 2 PATERNAL  GRANDFATHER: CVA  DECEASED 4'S  Social History: low carb Widow Never Smoked Alcohol use-yes: rarely ( every 3-4 months) Caffeine use/day:  3-4 decaf ; 1 cup tea Dental Care w/in 6 mos.:  yes Sun Exposure-Excessive:  no Seat Belt Use:  yes Blood Transfusions:  no  Review of Systems  The patient denies anorexia, fever, hoarseness, prolonged cough, hemoptysis, melena, hematochezia, severe indigestion/heartburn, suspicious skin lesions, unusual weight change, abnormal bleeding, enlarged lymph nodes, and angioedema.    Physical Exam  General:  well-nourished; appears younger thanage;alert,appropriate and cooperative throughout examination Head:  Normocephalic and atraumatic without obvious abnormalities.  Eyes:  No corneal or conjunctival inflammation noted.  Funduscopic exam benign, without hemorrhages, exudates or papilledema.  Ears:  External ear exam shows no significant lesions or deformities.  Otoscopic examination reveals clear canals, tympanic membranes are intact bilaterally without bulging, retraction, inflammation or discharge. Hearing is grossly normal bilaterally. Nose:  External nasal examination shows no deformity or inflammation. Nasal mucosa are pink and moist without lesions or exudates. Mouth:  Oral  mucosa and oropharynx without lesions or exudates.  Teeth in good repair. Neck:  No deformities, masses, or tenderness noted. Lungs:  Normal respiratory effort, chest expands symmetrically. Lungs are clear to auscultation, no crackles or wheezes. Heart:  regular rhythm, no murmur, no gallop, no rub, no JVD, no HJR, and bradycardia.   Abdomen:  Bowel sounds positive,abdomen soft and non-tender without masses, organomegaly or hernias noted. Pulses:  R and L carotid,radial,dorsalis pedis and posterior tibial pulses are full and equal bilaterally Extremities:  No clubbing, cyanosis, edema. minor DIP OA changes; fusiform changes R > L knee with decreased ROM Neurologic:  alert & oriented X3, strength normal in all extremities, sensation intact to light touch, and DTRs symmetrical and normal.   Skin:  Intact without suspicious lesions or rashes Cervical Nodes:  No lymphadenopathy noted Axillary Nodes:  No palpable lymphadenopathy Psych:  memory intact for recent and remote, normally interactive, and good eye contact.     Impression & Recommendations:  Problem # 1:  PREVENTIVE HEALTH CARE (ICD-V70.0)  Orders: Medicare -1st Annual Wellness Visit [phone])  Problem # 2:  DM (ICD-250.00)  Her updated medication list for this problem includes:  Metformin Hcl 1000 Mg Tabs (Metformin hcl) .Marland Kitchen... Take one tablet by mouth twice a day    Benazepril Hcl 20 Mg Tabs (Benazepril hcl) .Marland Kitchen... 1 qd    Januvia 100 Mg Tabs (Sitagliptin phosphate) .Marland Kitchen... 1 once daily  Problem # 3:  HYPERTENSION, ESSENTIAL NOS (ICD-401.9)  Her updated medication list for this problem includes:    Benazepril Hcl 20 Mg Tabs (Benazepril hcl) .Marland Kitchen... 1 qd  Problem # 4:  HYPERLIPIDEMIA (ICD-272.2)  Her updated medication list for this problem includes:    Pravastatin Sodium 40 Mg Tabs (Pravastatin sodium) .Marland Kitchen... 1 qhs  Problem # 5:  THYROID NODULE, RIGHT (ICD-241.0) [place] survelliance  Problem # 6:  OSTEOPOROSIS  (ICD-733.00)  Problem # 7:  C V A/STROKE (ICD-434.91)  Complete Medication List: 1)  Caltrate With Vitamin D  .... Once daily 2)  Klonopin 0.5 Mg Tabs (Clonazepam) .... Take one tablet by mouth at night 3)  Metformin Hcl 1000 Mg Tabs (Metformin hcl) .... Take one tablet by mouth twice a day 4)  Baby Asa  .... Once daily 5)  Pravastatin Sodium 40 Mg Tabs (Pravastatin sodium) .Marland Kitchen.. 1 qhs 6)  Freestyle Lite Test Strp (Glucose blood) .... Use as directed 7)  Benazepril Hcl 20 Mg Tabs (Benazepril hcl) .Marland Kitchen.. 1 qd 8)  Vit-d 600mg   .... 1 by mouth once daily 9)  Citalopram Hydrobromide 20 Mg Tabs (Citalopram hydrobromide) .Marland Kitchen.. 1 once daily 10)  Januvia 100 Mg Tabs (Sitagliptin phosphate) .Marland Kitchen.. 1 once daily  Other Orders: Tdap => 25yrs IM (32440) Admin 1st Vaccine (10272)  Patient Instructions: 1)  Check your blood sugars regularly. If your readings are usually above : 150 or below 90 you should contact our office. 2)  It is important that your Diabetic A1c level is checked every 3 months. 3)  See your eye doctor yearly to check for diabetic eye damage. 4)  Check your feet each night for sore areas, calluses or signs of infection. 5)  Check your Blood Pressure regularly. If it is above:135/85  you should make an appointment. Ultrasound of thyroid  recommended in 01/2011 with repeat labs (see lbas below). Colonoscopy due 2015.  6)  Please schedule a follow-up appointment in 4 months.Vit D level, 268.9; 7)  BMP prior to visit, ICD-9:401.9; 8)  Hepatic Panel prior to visit, ICD-9:995.20; 9)  Lipid Panel prior to visit, ICD-9:272.4; 10)  TSH prior to visit, ICD-9:241.0; 11)  CBC w/ Diff prior to visit, ICD-9:780.79; 12)  HbgA1C ,urine microalbuminprior to visit, ICD-9:250.0 Prescriptions: JANUVIA 100 MG TABS (SITAGLIPTIN PHOSPHATE) 1 once daily  #30 Tablet x 5   Entered and Authorized by:   Marga Melnick MD   Signed by:   Marga Melnick MD on 09/29/2010   Method used:   Print then Give to  Patient   RxID:   [id number] CITALOPRAM HYDROBROMIDE 20 MG TABS (CITALOPRAM HYDROBROMIDE) 1 once daily  #90 x 1   Entered and Authorized by:   Marga Melnick MD   Signed by:   Marga Melnick MD on 09/29/2010   Method used:   Print then Give to Patient   RxID:   [id number] BENAZEPRIL HCL 20 MG TABS (BENAZEPRIL HCL) 1 qd  #90 x 3   Entered and Authorized by:   Marga Melnick MD   Signed by:   Marga Melnick MD on 09/29/2010   Method used:   Print then Give to Patient   RxID:   [id number] PRAVASTATIN SODIUM 40 MG  TABS (PRAVASTATIN SODIUM)  1 qhs  #90 x 3   Entered and Authorized by:   Marga Melnick MD   Signed by:   Marga Melnick MD on 09/29/2010   Method used:   Print then Give to Patient   RxID:   [id number] KLONOPIN 0.5 MG  TABS (CLONAZEPAM) TAKE ONE TABLET BY MOUTH AT NIGHT  #90 x 1   Entered and Authorized by:   Marga Melnick MD   Signed by:   Marga Melnick MD on 09/29/2010   Method used:   Print then Give to Patient   RxID:   [id number]    Orders Added: 1)  Tdap => 30yrs IM [90715] 2)  Admin 1st Vaccine [90471] 3)  Medicare -1st Annual Wellness Visit [G0438] 4)  Est. Patient Level III [34742]   Immunizations Administered:  Tetanus Vaccine:    Vaccine Type: Tdap    Site: left deltoid    Mfr: GlaxoSmithKline    Dose: 0.5 ml    Route: IM    Given by: Shonna Chock CMA    Exp. Date: 05/11/2012    Lot #: [id number]    VIS given: 06/09/08 version given September 29, 2010.   Immunizations Administered:  Tetanus Vaccine:    Vaccine Type: Tdap    Site: left deltoid    Mfr: GlaxoSmithKline    Dose: 0.5 ml    Route: IM    Given by: Shonna Chock CMA    Exp. Date: 05/11/2012    Lot #: [id number]    VIS given: 06/09/08 version given September 29, 2010.

## 2010-10-08 LAB — CBC
HCT: 37.2 % (ref 36.0–46.0)
MCHC: 34.7 g/dL (ref 30.0–36.0)
MCV: 94.7 fL (ref 78.0–100.0)
Platelets: 214 10*3/uL (ref 150–400)
RDW: 12.6 % (ref 11.5–15.5)

## 2010-10-08 LAB — BASIC METABOLIC PANEL
BUN: 21 mg/dL (ref 6–23)
CO2: 29 mEq/L (ref 19–32)
Chloride: 105 mEq/L (ref 96–112)
Creatinine, Ser: 0.59 mg/dL (ref 0.4–1.2)
Glucose, Bld: 82 mg/dL (ref 70–99)
Potassium: 4.3 mEq/L (ref 3.5–5.1)

## 2010-10-11 LAB — GLUCOSE, CAPILLARY: Glucose-Capillary: 125 mg/dL — ABNORMAL HIGH (ref 70–99)

## 2010-12-05 NOTE — Op Note (Signed)
[name], BAND NO.:  [id number]   MEDICAL RECORD NO.:  [id number]          PATIENT TYPE:  INP   LOCATION:  5039                         FACILITY:  [facility]   PHYSICIAN:  Claude Manges. Whitfield, M.D.DATE OF BIRTH:  Feb 24, 1941   DATE OF PROCEDURE:  03/30/2008  DATE OF DISCHARGE:                               OPERATIVE REPORT   PREOPERATIVE DIAGNOSIS:  End-stage osteoarthritis, right knee.   POSTOPERATIVE DIAGNOSIS:  End-stage osteoarthritis, right knee.   PROCEDURES:  Right total knee replacement with computer navigation.   SURGEON:  Claude Manges. Cleophas Dunker, MD   ASSISTANT:  Oris Drone. Petrarca, PA-C   ANESTHESIA:  General with supplemental femoral nerve block.   COMPLICATIONS:  None.   COMPONENTS:  DePuy LCS standard plus femoral component, #4 keeled  rotating tibial tray with a 12.5 mm bridging bearing, a metal back  rotating patella.  All were secured with polymethyl methacrylate with  tobramycin.   PROCEDURE:  The patient was comfortable on the operating table and under  general orotracheal anesthesia, nursing staff inserted a Foley catheter.  Urine was clear.  The patient did receive a preoperative femoral nerve  block.  A tourniquet was applied to the right lower extremity.  The leg  was then prepped with Betadine scrub and then DuraPrep from the  tourniquet to the midfoot.  Sterile draping was performed.  With the  extremity elevated, it was Esmarch exsanguinated with a proximal  tourniquet at 350 mmHg.   A midline longitudinal incision was made centered about the patella  extending from the superior pouch to the tibial tubercle via a sharp  dissection.  Incision was carried down to subcutaneous tissue.  First  layer of capsule was incised in the midline.  A medial parapatellar  incision was then made with the Bovie.  Joint was then entered.  There  was minimal clear yellow joint effusion.  The patella was everted 180  degrees and the knee flexed to 90  degrees.  There was then varus  position of the knee, although I could correct it to neutral.  There  were large osteophytes along the lateral and medial femoral condyle  which were excised so that we could templated the femur to a standard  plus component.   Synovectomy was performed.   We did employ the computer navigation.  Two Schanz pins were placed in  the distal femur and 2 in the proximal tibia and the computer arrays  were then applied.  Computer points were then determined for the normal  anatomic axis and to determine the size of the appropriate femoral and  tibial sizes.  It computed to a #4 tibial tray and a standard plus  femoral component.   Initial cut was then made transversely on the proximal tibia.  We then  used the flexion-extension gaps to determine appropriate tension.  Subsequent cuts were then made on the femur based on the computer  points.  A 12.5 mm bridging bearings were symmetrical in both flexion  and extension.  All of our computer points were within 1 to 1-1/2  degrees of normal anatomic axis.   Lamina spreaders were then inserted to the medial and lateral joint.  Medial and lateral menisci removed as well as ACL and PCL.  There was a  small Baker cyst medially, its capsule was excised.  MCL and LCL  remained intact.   The retractor was then placed about the tibia, a #4 tibial tray appeared  to be a perfect match.  This was applied to the center hole made  followed by the keeled cut.  The #4 tibial tray was left in place with a  12.5 mm bridging bearing.  The trial standard plus femoral component was  applied.  Based on the computer, we had about 3-4 degrees of  hyperextension.  There was no opening with varus or valgus stress.  We  were then 0.5 mm of having symmetrical varus-valgus.   The patella was then prepared by removing about 10 mm of bone leaving of  a patella thickness of 15 mm.   The trial patellar component was applied and through  full range of  motion, there was no subluxation.   Trial components were then removed.  The joint was copiously irrigated  with a jet saline.  We injected Marcaine with epinephrine into the deep  capsule medially and laterally.   Retractor was then placed about the tibia.  The final #4 tibial tray was  applied with polymethyl methacrylate.  We used 1.2 grams of tobramycin  in both of the batches.  The extraneous methacrylate was removed from  around the tibia.  The 12.5-mm polyethylene final component was then  applied.  The tibia was applied with polymethyl methacrylate also with  Tobramycin.  It was then impacted and extraneous methacrylate was  removed.  The knee was placed in extension.  Patella was applied with  methacrylate and tobramycin and the patellar clamp.   After complete maturation of the methacrylate, the joint was inspected.  Any further hardened methacrylate was removed with an osteotome.  Joint  was irrigated with jet saline.  Tourniquet was deflated.  Gross bleeders  were Bovie coagulated and had very nice hemostasis.  Through full range  of motion, there was no malrotation of the patella or the tibial  polyethylene component and I did not have any appreciable varus or  valgus opening.   The deep capsule was closed with an interrupted #1 Ethibond.  Superficial capsule was closed with running 0 Vicryl, subcu with 0 and 2-  0 Vicryl, and skin closed with skin clips.  The computer arrays were  removed.  A sterile bulky dressing was applied followed by the patient's  support stocking.   The patient tolerated the procedure without complications.      Claude Manges. Cleophas Dunker, M.D.  Electronically Signed     PWW/MEDQ  D:  03/30/2008  T:  03/31/2008  Job:  [id number]

## 2010-12-05 NOTE — Assessment & Plan Note (Signed)
[facility]                        [place] OFFICE NOTE   ROWENE, SUTO                         MRN:          [id number]  DATE:12/10/2006                            DOB:          1941-07-11    Destaney Sarkis was seen Dec 10, 2006 for followup of diabetes and med  refill.   She is on Lexapro and Klonopin with no active symptoms.  Insomnia is  controlled with the Klonopin.   Her fasting blood sugars have averaged 140-180.  Postprandial glucoses  have not been checked regularly.  She is vague as to the exact values.  She believes they have been as low as 95.  She has seen an  ophthalmologist this year, but there are no new issues.  She denies  polyphagia, polydipsia, or polyuria.  She has no new paresthesias.  She  denies any chest pain or palpitations.   She has felt weak and sweaty in the afternoons, particularly if she is  physically active.  She questions whether this was related to increasing  her metformin to twice a day.  Her past history is unchanged.  She did  have wrist surgery as an outpatient in September of 2007.   She has never smoked and does not drink.  She is not on specific diet  right now.  She was previously on the [facility] Diet.   REVIEW OF SYSTEMS:  Indicates that she has had dysphagia for  approximately a year, 4 to 5 times a week even with water.   There are no other GI symptoms.  Her last colonoscopy was in 2005 and it  was normal.   CURRENT MEDICATIONS:  1. Amaryl 2 mg 1/2 twice a day.  2. Actonol 35 mg weekly.  3. Caltrate with vitamin D.  4. Lexapro 10 mg daily.  5. Klonopin 0.5 mg at bedtime.  6. Metformin 1000 mg twice a day.   Premarin caused weight gain and fibrocystic changes.   EXAM:  Weight is up to 193.6, pulse 60 and regular, respiratory rate 15,  and blood pressure 110/74.  She does have arteriolar narrowing.  There is no icterus or jaundice.  The thyroid is asymmetric with the right lobe  greater than the left,  without nodularity.  CHEST:  Reveals decreased breath sounds with no increased work of  breathing.  She does have an S4 with grade 1/2 to 1 systolic murmur.  She has no lymphadenopathy about the head, neck, or axillae.  She has no organomegaly or masses, or abdominal tenderness.  Pedal pulses are intact.  There is no edema.  Skin and nail health appears good.   An A1c will be checked along with fasting lipids,renal and hepatic  function.   Because of the dysphagia, a GI consultation with Dr. Juanda Chance is  recommended.  Stool cards will be provided today & a CBC  also ordered.     Titus Dubin. Alwyn Ren, MD,FACP,FCCP  Electronically Signed    [facility]/MedQ  DD: 12/10/2006  DT: 12/10/2006  Job #: [id number]

## 2010-12-08 NOTE — Discharge Summary (Signed)
NAMESABRIA, Wallace NO.:  [id number]   MEDICAL RECORD NO.:  [id number]          PATIENT TYPE:  INP   LOCATION:  5039                         FACILITY:  [facility]   PHYSICIAN:  [name], M.D.DATE OF BIRTH:  12/06/1940   DATE OF ADMISSION:  03/30/2008  DATE OF DISCHARGE:  04/02/2008                               DISCHARGE SUMMARY   ADMISSION DIAGNOSIS:  Osteoarthritis of the right knee.   DISCHARGE DIAGNOSES:  1. Osteoarthritis of the right knee.  2. History of right deep venous thrombosis.  3. History of methicillin-resistant Staphylococcus aureus.  4. Type 2 diabetes mellitus.  5. Hyperlipidemia.  6. Hypokalemia.  7. Obesity.   PROCEDURE:  Right total knee arthroplasty.   HISTORY:  [name] is a very pleasant 70 year old female with chronic  right knee pain.  She has had an arthroscopy of the right knee with  subsequent DVT postop.  She is now having worsening pain and problems  with her activities of daily living.  She has failed conservative  treatment.  Radiographic end-stage osteoarthritis on x-ray.  Indicated  now for right total knee arthroplasty.   HOSPITAL COURSE:  This 70 year old female was admitted on March 30, 2008.  After appropriate laboratory studies were obtained as well as 1  gram of vancomycin IV on-call to the operating room, she was taken to  the operating room where she underwent a right total knee arthroplasty.  She was continued on vancomycin per pharmacy dosing for a total of 2  days postop because of her history of MRSA.  This was 1000 mg IV q.12  hours for 4 doses.  She also had tobramycin placed into the methyl  methacrylate bone cement.  She was placed on a Dilaudid reduced dose PCA  pump.  Coumadin protocol was started.  Heparin 5000 units subcu q.12  hours was started on the evening of surgery because of her history of  DVT.  Foley was placed intraoperatively.  PT, OT, and care management  were consulted.  She may  be partial weightbearing 50% on the right.  CPM  0-70 degrees for 6-8 hours per day increasing by 5-10 degrees per day.  She was allowed out of bed to chair the following day.  She was weaned  off the PCA and off the oxygen.  Incentive spirometry was ordered.  She  also was placed on glycemic control of moderate protocol.  On March 31, 2008, her Percocet was discontinued.  She was placed on Vicodin 5/325  one to two tablets every 4 hours as needed for pain.  She became  hypokalemic on April 01, 2008 and KCl 20 mEq p.o. b.i.d. was  started.  Her metformin was begun at 1000 mg p.o. b.i.d. on April 01, 2008 also.  Lovenox instruction was given.  Her dressing was changed  on April 01, 2008.  The remainder of her hospital course was  uneventful and she was discharged on April 02, 2008 to return back  to the office in followup.   LABORATORY STUDIES:  Admission hemoglobin 14.0, hematocrit 40.5%,  white  count 9400, and platelets 228,000.  Discharge hemoglobin 10.4,  hematocrit 29.1%, white count 12,000, and platelets 231,000.  Protime  preop was 14.7, INR 1.1, and PTT 31.  Discharge Protime 22.2 and INR  1.8.  Preop sodium 136, potassium 4.2, chloride 105, CO2 24, glucose  134, BUN 14, and creatinine 0.54.  She was hyponatremic and dropped to  132 on April 01, 2008.  Hypokalemia on the same date at 3.4.  Discharge sodium was 135, potassium 3.6, chloride 103, CO2 26, glucose  199, BUN 6, and creatinine 0.62.  GFR was greater than 60 throughout her  hospital stay.  Total protein preop 6.5, albumin 4.1, AST 22, ALT 21,  ALP 29, and total bilirubin 0.7.  Glycosylated hemoglobin was 6.4.  Urinalysis of March 24, 2008 revealed trace leukocyte esterase, few  epithelials, 3-6 whites, 0-2 reds, and many bacteria.  Blood type was A+  and antibody screen negative.  Urine culture of March 24, 2008  revealed greater than 100,000 colonies of E. coli.  This was sensitive  to  ceftriaxone, Cipro, gentamicin, levofloxacin, nitrofurantoin,  tobramycin, and Septra.   DISCHARGE INSTRUCTIONS:  She is to continue her diabetic diet.  Increase  her activity slowly.  Use a walker for ambulation with 50% weightbearing  on the operative extremity.  No lifting or driving for 6 weeks.  Keep  her incision clean and dry and change the dressing daily.   DISCHARGE MEDICATIONS:  1. Coumadin 5 mg as directed by home health pharmacist.  2. Percocet 5/325 one to two tablets every 4 hours as needed for pain.  3. Robaxin 500 mg 1 tablet every 6 hours as needed for spasm.  4. Lovenox 30 mg inject 8 a.m. and 8 a.m. until stopped by [name]      pharmacist.   [name]:  She will be followed up in the office on April 12, 2008.   CONDITION ON DISCHARGE:  She was discharged in improved condition.      [name], P.A.-C.      [name], M.D.  Electronically Signed    BDP/MEDQ  D:  05/11/2008  T:  05/11/2008  Job:  [id number]

## 2010-12-08 NOTE — Discharge Summary (Signed)
[name], [name]                            ACCOUNT NO.:  [id number]   MEDICAL RECORD NO.:  [id number]                   PATIENT TYPE:  INP   LOCATION:  5019                                 FACILITY:  [facility]   PHYSICIAN:  Claude Manges. Cleophas Dunker, M.D.            DATE OF BIRTH:  29-Apr-1941   DATE OF ADMISSION:  05/26/2002  DATE OF DISCHARGE:  05/29/2002                                 DISCHARGE SUMMARY   ADMISSION DIAGNOSES:  1. End-stage left knee pain with osteoarthritis.  2. Type 2 diabetes mellitus.  3. Hypertension.  4. Depression.   DISCHARGE DIAGNOSES:  1. Left total knee arthroplasty.  2. Type 2 diabetes mellitus.  3. Hypertension.  4. Depression.   HISTORY OF PRESENT ILLNESS:  The patient is a 70 year old white female who  presents with a history of left knee pain for several years.  The patient  had an arthroscopy in 5/03, after several years of discomfort.  The pain has  been worsening ever since with no improvement with the arthroscopy.  The  patient describes the pain as constant, stabbing sensation occasionally over  the medial joint line with no radiation.  It worsens with any type of  weather changes, weightbearing activities, and it improves with rest.  She  does note some catching and night pain.  She does have swelling.   ALLERGIES:  CODEINE.   MEDICATIONS:  1. Celebrex 200 mg p.o. q.d.  2. Clonazepam 0.5 mg p.o. q.d.  3. Lexapro 10 mg p.o. q.d.   SURGICAL PROCEDURE:  On 05/26/02, the patient was taken to the operating room  by Dr. Norlene Campbell, assisted by Arlyn Leak, P.A.-C.  Under general  anesthesia, the patient underwent a left total knee replacement.  The  patient tolerated the procedure well.  There were no complications.  She did  receive a postoperative femoral nerve block to assist in pain control.  There was one Hemovac drain left in place.   CONSULTATIONS:  Physical therapy, occupational therapy, rehabilitation.   HOSPITAL COURSE:  On  05/26/02, the patient was admitted to [facility] under the care of Dr. Norlene Campbell.  The patient was taken to  the OR where a left total knee arthroplasty was performed.  The patient  tolerated the procedure well.  There were no complications.  The patient was  given a postoperative femoral nerve block, and she was transferred to the  recovery room and then to the orthopaedic floor in good condition.  The  patient was placed on Coumadin for routine deep venous thrombosis  prophylaxis.   The patient then incurred a total of three days postoperative care on the  orthopaedic floor in which the patient did very well.  She progressed  rapidly with physical therapy.  Her pain was well controlled with PCA  initially, and then p.o. pain medications.  Her wounds remained benign for  any signs  of infection.  Her leg remained neuromotor vascularly intact.  Her  vital signs remained stable.  She was able to get up to 95 degrees with CPM  without any discomfort.  The patient did develop some calf tenderness on  postoperative day #3.  A venous Doppler was performed, and this was found to  be negative for signs of any deep vein thrombosis. so the patient was  discharged to home in good condition with her INR at 2.0.   LABORATORY DATA:  Venous Doppler on 05/29/02, showed no sign of deep vein  thrombosis, superficial thrombus, or Baker's cyst bilaterally.   CBC on 05/29/02, white blood cell count 10.5, hemoglobin 10.7, hematocrit  30.3, platelets 207.   INR was 2.0 on 05/29/02.   Routine urinalysis on admission was normal.   MEDICATIONS UPON DISCHARGE FROM ORTHO FLOOR:  1. Benadryl 25 mg p.o. q.6h. p.r.n.  2. Colace 100 mg p.o. b.i.d.  3. Trinsicon one tablet p.o. t.i.d.  4. Laxative or enema of choice p.r.n.  5. Percocet one or two tablets q.4-6h. p.r.n.  6. Tylenol 650 mg p.o. q.4h. p.r.n.  7. Robaxin 500 mg p.o. q.6h. p.r.n.  8. Restoril 30 mg p.o. q.h.s. p.r.n.  9. Lexapro 10 mg  p.o. q.d.  10.      Klonopin 0.5 mg p.o. q.d.  11.      Coumadin 5 mg p.o. q.d.   DISCHARGE MEDICATIONS:  1. The patient is to continue routine medications.  2. Percocet 5 mg one or two tablets q.4-6h. p.r.n.  3. Robaxin 500 mg one tablet q.8h. for muscle spasms p.r.n.  4. Coumadin 4 mg tablets take one q.d.   ACTIVITY:  As tolerated with the use of a walker.  The patient may shower.   DIET:  No restrictions.   WOUND CARE:  Keep wound clean and dry.  If any signs of infection, contact  Dr. Hoy Register office.   FOLLOWUP:  Call [phone] for a follow up appointment with Dr. Cleophas Dunker in  10 days.   CONDITION ON DISCHARGE:  Good.      Jamelle Rushing, P.A.                      Claude Manges. Cleophas Dunker, M.D.   RWK/MEDQ  D:  07/13/2002  T:  07/13/2002  Job:  [id number]

## 2010-12-08 NOTE — H&P (Signed)
[name], [name]                            ACCOUNT NO.:  [id number]   MEDICAL RECORD NO.:  [id number]                   PATIENT TYPE:  INP   LOCATION:  NA                                   FACILITY:  [facility]   PHYSICIAN:  Claude Manges. Cleophas Dunker, M.D.            DATE OF BIRTH:  12/21/1940   DATE OF ADMISSION:  05/26/2002  DATE OF DISCHARGE:                                HISTORY & PHYSICAL   CHIEF COMPLAINT:  Progressively worsening left knee pain for the last  several years.   HISTORY OF PRESENT ILLNESS:  This 70 year old white female patient presents  to Dr. Cleophas Dunker with a several year history of gradual onset of  progressively worsening left knee pain.  She has a history of a left knee  arthroscopy in 5/03, but no known injuries to the knee.  She was found to  have a significant amount of chondromalacia and degenerative changes in the  lateral compartment.  Since that time, the pain has become pretty much  constant.  It is described as a throbbing or stabbing sensation mostly over  the medial aspect of the knee.  Pain increases with rainy weather and  weightbearing on the leg, and then decreases with rest.  The knee does catch  at times.  It also swells and keeps her up at night.  There is no popping,  grinding, locking, or giving way.  She is currently taking Celebrex and  Tylenol for moderate relief of her pain.  Cortizone shots have worked for a  short time in the past, but she underwent a series of Hyalagan injections  and that provided no relief.  She is not currently ambulating with any  assistive devices.   ALLERGIES:  CODEINE makes her nauseated.   MEDICATIONS:  1. Celebrex 200 mg one tablet p.o. q.d.  2. Clonazepam 0.5 mg one tablet p.o. q.d.  3. Lexapro 10 mg one tablet p.o. q.d.  4. She was taking Lotensin 10 mg q.d., but that made her itch, so she has     been told by Dr. Alwyn Ren to take it only if her blood pressure is above     130/80, and then to take only 1/2  a tablet.   PAST MEDICAL HISTORY:  1. Diet controlled type 2 diabetes mellitus.  2. Borderline hypertension.  3. History of depression which is treated with the Lexapro.   She denies any history of thyroid disease, hiatal hernia, reflux, peptic  ulcer disease, heart disease, asthma, or any other chronic medical  condition.   PAST SURGICAL HISTORY:  1. Hysterectomy by Dr. Lucretia Roers in 10/76.  2. Left shoulder arthroscopy and subacromial decompression by Dr. Claude Manges.     [place]field in approximately 1992.  3. Right carpal tunnel release by Dr. Claude Manges. Whitfield in 2000.  4. Left knee arthroscopy on 12/04/01, by Dr. Claude Manges. Whitfield.   SOCIAL HISTORY:  She denies any history of cigarette smoking, alcohol use,  or drug use.  She is married and has two children.  She, her husband, and  her granddaughter live in a one story house with one step into the main  entrance.  She is a part-time Tree surgeon.  Her medical doctor is Dr. Marga Melnick.   FAMILY HISTORY:  Her mother died at the age of 11 with bladder cancer.  Her  father died at the age of 69 with coronary artery disease and hypertension.  He also had atherosclerosis.  She has had eight siblings.  She has two  brothers and six sisters.  Her siblings range in age from 71 to 31.  She had  two sisters who have passed away.  One at age of 49 due to complications of  diabetes, and one at age 71 with a brain tumor.  Her oldest brother has had  prostate cancer and diabetes.  She has two children, a son who is 54 years  old and a daughter age 49 whose only problem is some drug problems.   REVIEW OF SYMPTOMS:  She does wear contacts.  She has problems with  allergies on and off.  She does have some stiffness in her neck which she  attributes to her being a Producer, television/film/video for many years.  She has occasional  urinary frequency, and she does have a living will.  All other systems are  negative or noncontributory.   PHYSICAL EXAMINATION:  GENERAL:   A well-developed, well-nourished, slightly  overweight white female in no acute distress.  Mood and affect are  appropriate.  Talks easily with examiners.  VITAL SIGNS:  Height is 5 feet 6 inches, weight is 183 pounds.  BMI is 28.5.  Temperature is 97.7 degrees Fahrenheit, pulse 76, respirations 14, blood  pressure 122/68.  HEENT:  Normocephalic, atraumatic without frontal or maxillary sinus  tenderness to palpation.  Conjunctivae pink.  Sclerae anicteric.  Pupils  equal, round, reactive to light and accommodation.  Extraocular movements  were intact.  No visible external ear deformities.  Hearing grossly intact.  Tympanic membranes are pearly gray bilaterally with good light reflux.  Nasal septum midline.  Nasal mucosa pink and moist without exudates or  polyps noted.  Buccal mucosa pink and moist.  Good dentition.  Pharynx  without erythema or exudates.  Tongue and uvula midline.  Tongue without  fasciculations, and uvula rises equally with phonation.  NECK:  No visible masses or lesions noted.  Trachea midline.  No palpable  lymphadenopathy, no thyromegaly.  Carotids are +2 bilaterally without  bruits.  Full range of motion, nontender to palpation along the cervical  spine.  CARDIOVASCULAR:  Heart rate and rhythm are regular.  S1 and S2 present  without rubs, clicks, or murmurs noted.  RESPIRATORY:  Respirations even and unlabored.  Breath sounds clear to  auscultation bilaterally without rales or wheezes noted.  ABDOMEN:  Rounded abdominal contour.  Bowel sounds present x4 quadrants.  Soft and nontender to palpation without hepatosplenomegaly nor CVA  tenderness.  Femoral pulses +2 bilaterally.  Nontender to palpation along  the entire length of the vertebral collum.  BREASTS:  These exams deferred at this time.  GENITOURINARY:  These exams deferred at this time.  RECTAL:  These exams deferred at this time. PELVIC:  These exams deferred at this time.  MUSCULOSKELETAL:  No obvious  deformities of the bilateral upper extremities  with full range of motion to these extremities  without pain.  Radial pulses  are +2 bilaterally.  She has full range of motion of her hips, ankles, and  toes bilaterally.  DP and PT pulses are +2.  Negative Homan's sign  bilaterally.  No lower extremity edema.  She does walk with a slight limp on  the left.  Right knee has full extension and flexion to 130 degrees.  There  is no crepitus with range of motion of the knee, and no effusion.  No pain  with palpation along the joint lines.  She is stable to varus and valgus  stress with a negative McMurray's and Lachman's.  Left knee is lacking about  10 to 15 degrees of full extension.  Can flex to 118 degrees, but there is a  moderate amount of crepitus.  She does have pain with palpation along the  medial joint lines, none really laterally.  Effusion +1.  Well-healed  arthroscopy portals noted on skin.  Stable to varus and valgus stress, and  negative anterior drawer and McMurray's.  NEUROLOGIC:  Alert and oriented x3.  Cranial nerves II-XII intact.  Strength  5/5 bilateral upper and lower extremities.  Alternating movements intact.  Deep tendon reflexes are 2+ bilateral upper and lower extremities.   LABORATORY DATA:  X-rays taken of her left knee in 5/02, showed a minimal  varus position to a degree, and minimal joint space narrowing.  Arthroscopy  in May, showed degenerative changes in the lateral compartment of the knee.   IMPRESSION:  1. Osteoarthritis of the lateral compartment of the left knee.  2. Type 2 diabetes mellitus, controlled by diet.  3. Borderline hypertension.  4. History of depression.   PLAN:  The patient will be [facility], where  she will undergo a left total knee arthroplasty by Dr. Claude Manges. [place]field.  She will undergo the routine preoperative laboratory tests and studies prior  to this procedure.  If we have any medical issues while  she is hospitalized,  we will consult Dr. Alwyn Ren.       Legrand Pitts Duffy, P.A.                      Claude Manges. Cleophas Dunker, M.D.    KED/MEDQ  D:  05/21/2002  T:  05/21/2002  Job:  [id number]

## 2010-12-08 NOTE — Op Note (Signed)
[name], [name]                            ACCOUNT NO.:  [id number]   MEDICAL RECORD NO.:  [id number]                   PATIENT TYPE:  INP   LOCATION:  2895                                 FACILITY:  [facility]   PHYSICIAN:  Claude Manges. Cleophas Dunker, M.D.            DATE OF BIRTH:  Mar 14, 1941   DATE OF PROCEDURE:  05/26/2002  DATE OF DISCHARGE:                                 OPERATIVE REPORT   PREOPERATIVE DIAGNOSIS:  End-stage osteoarthritis, left knee.   POSTOPERATIVE DIAGNOSIS:  End-stage osteoarthritis, left knee.   PROCEDURE:  Left total knee replacement.   SURGEON:  Claude Manges. Cleophas Dunker, M.D.   ASSISTANT:  Jamelle Rushing, P.A.   ANESTHESIA:  General orotracheal with supplemental femoral nerve block  postoperatively.   COMPLICATIONS:  None.   COMPONENTS:  DePuy LCS Complete standard femoral keeled #3 rotating tibial  tray with a 10 mm bridging bearing and a cruciate-designed, metal-backed  patella, all secured with polymethyl methacrylate.   DESCRIPTION OF PROCEDURE:  With the patient comfortable on the operating  table and under general orotracheal anesthesia, the left lower extremity was  placed in a thigh tourniquet.  A Foley was then inserted by the nursing  staff.  The left lower extremity was then prepped with Betadine scrub and  then Duraprep from the tourniquet to the midfoot.  Sterile draping was  performed.  With the extremity still elevated, it was Esmarch-exsanguinated  with a proximal tourniquet at 350 mmHg.   A midline longitudinal incision was then made centered above the patella,  extending from the superior pouch to the tibial tubercle.  By sharp  dissection the incision was carried down to the subcutaneous tissue.  The  first layer of capsule was incised in the midline.  A medial parapatellar  incision was then made with the Bovie in a longitudinal line through the  quadriceps mechanism proximally, to the tibial tubercle distally.  There was  a small  amount of clear yellow joint effusion.   The patella was everted 180 degrees, the knee flexed to 90 degrees.  There  was considerable osteophyte formation along the medial and to a greater  extent than the lateral femoral condyle.  There was almost complete absence  of articular cartilage in the medial femoral condyle and considerable  thinning laterally with a few areas of exposed subchondral bone.  Preoperatively we had measured a standard femoral component and a #3 tibial  component.  These were confirmed intraoperatively.   The appropriate femoral and tibial jigs were then applied to obtain  appropriate cuts on both the femur and the tibia.  The ACL and PCL were  sacrificed.  MCL and LCL remained intact throughout the procedure.  A 7  degree angle of inclination was used on the tibial cut.  A 4 degree distal  femoral valgus cut was utilized.  A 10 mm flexion and extension gap were  perfectly symmetrical.   Lamina spreaders were then inserted along the medial and lateral compartment  to remove medial and lateral menisci and remnants of ACL and PCL.  There  were osteophytes medially behind the femoral condyle posteriorly, which were  removed with an osteotome.  Carefully the Bovie coagulator sat behind the  native knee for postoperative bleeding.   The final cut on the tibia was then made.  The trial components were  inserted with a 10 mm bridging bearing.  The keeled tibial component was  utilized.  The knee was then reduced, the knee placed through a full range  of motion.  There was no abnormal motion of the tibial tray.  There was no  opening with a varus or valgus stress.   The patella measured 24 mm thick.  We removed 10 mm, using the patellar  guide.  The cruciate-designed jig was then applied to make the cruciate cut  in the patella.  A trial patella was applied and reduced.  Through a full  range of motion there was no lateral subluxation or dislocation.   The trial  components removed, the joint was then copiously irrigated with  saline and antibiotics solution.  The final components were inserted with  polymethyl methacrylate.  Extraneous methacrylate was removed.  The knee was  reduced.  There was pressure applied to the components for compression, the  patellar clamp applied to the patella.  After complete maturation of the  methacrylate, any extraneous hardened methacrylate was removed.  The joint  was then again placed through a full range of motion and perfectly intact.  The joint was again irrigated with saline solution.  The tourniquet was  deflated, gross bleeders were Bovie coagulated, a Hemovac inserted.  The  capsule closed with interrupted #1 Tycron, superficial capsule closed with a  running 0 Vicryl, the subcu with a 2-0 Vicryl, skin closed with skin clips.  Sterile bulky dressing was applied, followed by the support stocking.   The patient tolerated the procedure without complications.                                               Claude Manges. Cleophas Dunker, M.D.    PWW/MEDQ  D:  05/26/2002  T:  05/26/2002  Job:  [id number]

## 2010-12-08 NOTE — Op Note (Signed)
NAMEBRITTIANY, [name] NO.:  [id number]   MEDICAL RECORD NO.:  [id number]          PATIENT TYPE:  AMB   LOCATION:  DSC                          FACILITY:  [facility]   PHYSICIAN:  Dionne Ano. Gramig III, M.D.DATE OF BIRTH:  [birth year]   DATE OF PROCEDURE:  03/28/2006  DATE OF DISCHARGE:                                 OPERATIVE REPORT   PREOPERATIVE DIAGNOSIS:  End-stage left basilar thumb joint arthritis, with  failure of conservative management.   POSTOPERATIVE DIAGNOSIS:  End-stage left basilar thumb joint arthritis, with  failure of conservative management.   PROCEDURES:  1. Left carpometacarpal arthroplasty (removal of the trapezium at the base      of her thumb joint level/carpectomy).  2. Abductor pollicis longus 1/3 proper portion tendon transfer to the      flexor carpi radialis, abductor pollicis longus proper, and back upon      itself with multiple figure-of-eight throws (Weilby tendon transfer),      left basilar thumb joint.  3. Abductor pollicis longus digastric portion tendon transfer to the first      metacarpal through a drill hole around the flexor carpi radialis and      back upon the abductor pollicis longus and digastric tendons (Zancolli      tendon transfer), left basilar thumb joint.  4. Abductor pollicis longus shortening/tenodesis at the wrist level      (shortening of the wrist extensor to prevent dorsolateral escape), left      wrist, basilar thumb joint region.   SURGEON:  Dionne Ano. Amanda Pea, M.D.   ASSISTANT:  Karie Chimera, P.A.   COMPLICATIONS:  None.   ANESTHESIA:  General with preoperative regional block.   SPECIMENS:  None.   ESTIMATED BLOOD LOSS:  Minimal.   COMPLICATIONS:  None.   INDICATIONS FOR THE PROCEDURE:  This patient is a pleasant 70 year old  female, who presents with the above-mentioned diagnosis.  I have counseled  her in regard to the risks and benefits of surgery, including risks of  infection, bleeding,  anesthesia, damage to normal structures and failure of  surgery to accomplish the intended goals of alleviating symptoms and  restoring function.  She has a very remote history of MRSA infection, which  is distant from her extremity to be opted on; however, due to this, I did  give her preoperative vancomycin for antibiotic prophylaxis, in addition to  Ancef.   OPERATION IN DETAIL:  The patient was identified by myself and a member of  anesthesia and taken to the operating suite.  Underwent smooth induction of  general anesthesia.  I should note that vancomycin was given and that the  patient had a preoperative infraclavicular block for postoperative pain  management and sympathetic effect.  I isolated the arm, placed a tourniquet  and prepped and draped her with Betadine Scrub and Paint.  Following this,  she was placed about a sterile field secured by draping, and the tourniquet  was then insufflated to 300 mmHg, based upon her systolic blood pressure.  Following this, a dorsoradial incision about the [facility]  joint was made.  Dissection was carried out between the APL and EPB tendons.  The capsule was  incised, a Therapist, nutritional placed on either side of the trapezium, and the  radial artery and superficial radial nerve and its branches were protected  at all times.  Following this, I then very gently, delicately and  meticulously removed the trapezium piecemeal in nature.  Once this was  removed in its entirety, I performed an FCR tenolysis and tenosynovectomy,  freeing adhesions about it.  Once this was done, I then performed irrigation  and created a drill hole dorsal to palmar, extending intra-articularly in  line with the palmar beak ligament.  This was enlarged to a 3.5 drill bit.  This completed the arthroplasty/trapezium excision portion of the procedure.   Once this was done, I then made a counterincision about the dorsal third of  the wrist. Dissection was carried down to the  APL-digastric portion, and a  1/2 APL proper to 1/3 APL proper portion were harvested.  These were 2  distinct and separate tendons.  These were retrieved in the main wound.  Following this, I then performed abductor pollicis longus portion tendon  transfer to the first metacarpal around the FCR and back upon itself and the  APL proper.  This was inset with 3-0 FiberWire to my satisfaction, and  completed the Zancolli tendon transfer.  Following this, I then performed  APL 1/3 to 1/2 proper portion tendon transfer to the FCR, APL proper, and  back upon themselves with multiple figure-of-eight throws, completing the  Weilby tendon transfer.  This was inset with 3-0 FiberWire.  The thumb was  excellently secured and a nice space with good tension was noted.  There was  no tendency towards collapse, and I was quite pleased with this.  Once this  was done, I then performed an APL tenodesis, shortening of her wrist  extensor at the wrist level to prevent dorsolateral escape.  This was done  with 3-0 FiberWire, and a complex capsular closure was performed.  Following  this, I then performed irrigation, deflation of the tourniquet, and the  wounds were then closed with Prolene and a thumb spica splint was applied  without difficulty.  The patient tolerated this well, and there were no  complicating features.  The patient will be monitored in the recovery care  center overnight and be discharged home after an observatory stay.  I have  discussed with her pre and postop routines, etc., and all questions have  been encouraged and answered.  Her operation went quite uneventfully, and I  was pleased with the intraoperative findings and reconstruction.           ______________________________  Dionne Ano. Everlene Other, M.D.     Nash Mantis  D:  03/28/2006  T:  03/28/2006  Job:  [id number]

## 2010-12-21 ENCOUNTER — Ambulatory Visit (INDEPENDENT_AMBULATORY_CARE_PROVIDER_SITE_OTHER): Payer: Medicare Other | Admitting: *Deleted

## 2010-12-21 DIAGNOSIS — Z23 Encounter for immunization: Secondary | ICD-10-CM

## 2010-12-21 DIAGNOSIS — Z2911 Encounter for prophylactic immunotherapy for respiratory syncytial virus (RSV): Secondary | ICD-10-CM

## 2011-02-27 ENCOUNTER — Other Ambulatory Visit: Payer: Self-pay | Admitting: Internal Medicine

## 2011-02-27 MED ORDER — BENAZEPRIL HCL 20 MG PO TABS: 20.0000 mg | ORAL_TABLET | Freq: Every day | ORAL | Status: DC

## 2011-02-27 MED ORDER — CITALOPRAM HYDROBROMIDE 20 MG PO TABS: 20.0000 mg | ORAL_TABLET | Freq: Every day | ORAL | Status: DC

## 2011-02-27 MED ORDER — PRAVASTATIN SODIUM 40 MG PO TABS: 40.0000 mg | ORAL_TABLET | Freq: Every day | ORAL | Status: DC

## 2011-02-27 MED ORDER — METFORMIN HCL 1000 MG PO TABS: 1000.0000 mg | ORAL_TABLET | Freq: Two times a day (BID) | ORAL | Status: DC

## 2011-02-27 MED ORDER — CLONAZEPAM 0.5 MG PO TABS: 0.5000 mg | ORAL_TABLET | Freq: Every day | ORAL | Status: DC

## 2011-02-27 NOTE — Telephone Encounter (Signed)
RXs sent to the pharmacy.

## 2011-03-12 ENCOUNTER — Ambulatory Visit (INDEPENDENT_AMBULATORY_CARE_PROVIDER_SITE_OTHER): Payer: Medicare Other | Admitting: Family Medicine

## 2011-03-12 ENCOUNTER — Encounter: Payer: Self-pay | Admitting: Family Medicine

## 2011-03-12 VITALS — BP 124/72 | HR 88 | Temp 97.6°F | Wt 176.0 lb

## 2011-03-12 DIAGNOSIS — W57XXXA Bitten or stung by nonvenomous insect and other nonvenomous arthropods, initial encounter: Secondary | ICD-10-CM

## 2011-03-12 DIAGNOSIS — T148 Other injury of unspecified body region: Secondary | ICD-10-CM

## 2011-03-12 DIAGNOSIS — S30860A Insect bite (nonvenomous) of lower back and pelvis, initial encounter: Secondary | ICD-10-CM

## 2011-03-12 DIAGNOSIS — T148XXA Other injury of unspecified body region, initial encounter: Secondary | ICD-10-CM

## 2011-03-12 MED ORDER — DOXYCYCLINE HYCLATE 100 MG PO TABS: 100.0000 mg | ORAL_TABLET | Freq: Two times a day (BID) | ORAL | Status: AC

## 2011-03-12 NOTE — Patient Instructions (Signed)
Deer Tick Bites Deer ticks are brown arachnids (spider family) that vary in size from as small as the head of a pin to 1/4 inch (1/2 cm) diameter. They thrive in wooded areas. Deer are the preferred host of adult deer ticks. Small rodents are the host of young ticks (nymphs). When a person walks in a field or wooded area, young and adult ticks in the surrounding grass and vegetation can attach themselves to the skin. They can suck blood for hours to days if unnoticed. Ticks are found all over the [place] Some ticks carry a specific bacteria (Borrelia burgdorferi) that causes an infection called Lyme disease. The bacteria is typically passed into a person during the blood sucking process. This happens after the tick has been attached for at least a number of hours. While ticks can be found all over the [place] those carrying the bacteria that causes Lyme disease are most common in [place] and the [place]. Only a small proportion of ticks in these areas carry the Lyme disease bacteria and cause human infections. Ticks usually attach to warm spots on the body, such as the:  Head.   Back.   Neck.   Armpits.   Groin.  SYMPTOMS Most of the time, a deer tick bite will not be felt. You may or may not see the attached tick. You may notice mild irritation or redness around the bite site. If the deer tick passes the Lyme disease bacteria to a person, a round, red rash may be noticed 2 to 3 days after the bite. The rash may be clear in the middle, like a bull's-eye or target. If not treated, other symptoms may develop several days to weeks after the onset of the rash. These symptoms may include:  New rash lesions.   Fatigue and weakness.   General ill feeling and achiness.   Chills.   Headache and neck pain.   Swollen lymph glands.   Sore muscles and joints.  5 to 15% of untreated people with Lyme disease may develop more severe illnesses after several weeks to months. This may include inflammation  of the brain lining (meningitis), nerve palsies, an abnormal heartbeat, or severe muscle and joint pain and inflammation (myositis or arthritis). DIAGNOSIS  Physical exam and medical history.   Viewing the tick if it was saved for confirmation.   Blood tests (to check or confirm the presence of Lyme disease).  TREATMENT Most ticks do not carry disease. If found, an attached tick should be removed using tweezers. Tweezers should be placed under the body of the tick so it is removed by its attachment parts (pincers). If there are signs or symptoms of being sick, or Lyme disease is confirmed, medicines (antibiotics) that kill germs are usually prescribed. In more severe cases, antibiotics may be given through an intravenous (IV) access. HOME CARE INSTRUCTIONS  Always remove ticks with tweezers. Do not use petroleum jelly or other methods to kill or remove the tick. Slide the tweezers under the body and pull out as much as you can. If you are not sure what it is, save it in a jar and show your caregiver.   Once you remove the tick, the skin will heal on its own. Wash your hands and the affected area with water and soap. You may place a bandage on the affected area.   Take medicine as directed. You may be advised to take a full course of antibiotics.   Follow up with your caregiver as  recommended.  FINDING OUT THE RESULTS OF YOUR TEST Not all test results are available during your visit. If your test results are not back during the visit, make an appointment with your caregiver to find out the results. Do not assume everything is normal if you have not heard from your caregiver or the medical facility. It is important for you to follow up on all of your test results. PROGNOSIS If Lyme disease is confirmed, early treatment with antibiotics is very effective. Following preventive guidelines is important since it is possible to get the disease more than once. PREVENTION  Wear long sleeves and long  pants in wooded or grassy areas. Tuck your pants into your socks.   Use an insect repellent while hiking.   Check yourself, your children, and your pets regularly for ticks after playing outside.   Clear piles of leaves or brush from your yard. Ticks might live there.  SEEK MEDICAL CARE IF:  You or your child has an oral temperature above 100.4.   You develop a severe headache following the bite.   You feel generally ill.   You notice a rash.   You are having trouble removing the tick.   The bite area has red skin or yellow drainage.  SEEK IMMEDIATE MEDICAL CARE IF:  Your face is weak and droopy or you have other neurological symptoms.   You have severe joint pain or weakness.  MAKE SURE YOU:  Understand these instructions.   Will watch your condition.   Will get help right away if you are not doing well or get worse.  FOR MORE INFORMATION: Centers for Disease Control and Prevention: FootballExhibition.com.br American Academy [employer]icians: www.https://powers.com/ Document Released: 10/03/2009  [facility] Patient Information 2011 [place], [place].

## 2011-03-13 ENCOUNTER — Encounter: Payer: Self-pay | Admitting: Family Medicine

## 2011-03-13 LAB — B. BURGDORFI ANTIBODIES: B burgdorferi Ab IgG+IgM: 0.14 {ISR}

## 2011-03-13 NOTE — Progress Notes (Signed)
  Subjective:    Patient ID: [name], female    DOB: 11/02/1940, 70 y.o.   MRN: [id number]  HPI Pt here c/o tick bite about 2 weeks ago.  Pt states she has a rash on her abd and feels achy.  No fevers.   Review of Systems As above    Objective:   Physical Exam  Constitutional: She is oriented to person, place, and time. She appears well-developed and well-nourished.  Cardiovascular: Normal rate and normal heart sounds.   No murmur heard. Pulmonary/Chest: Effort normal and breath sounds normal.  Abdominal: Soft. She exhibits no distension.  Neurological: She is alert and oriented to person, place, and time.  Skin:       + bulls eye lesion on R side low abd           Assessment & Plan:  Tick bite----check rmsf and lymes titre                    Start doxycycline empirically                     Call or rto prn

## 2011-04-25 ENCOUNTER — Other Ambulatory Visit: Payer: Self-pay | Admitting: Internal Medicine

## 2011-04-25 LAB — PROTIME-INR
INR: 1.3
INR: 1.8 — ABNORMAL HIGH
Prothrombin Time: 14.7
Prothrombin Time: 16.2 — ABNORMAL HIGH
Prothrombin Time: 22.2 — ABNORMAL HIGH

## 2011-04-25 LAB — URINE CULTURE: Colony Count: >=100000

## 2011-04-25 LAB — GLUCOSE, CAPILLARY
Glucose-Capillary: 153 — ABNORMAL HIGH
Glucose-Capillary: 173 — ABNORMAL HIGH
Glucose-Capillary: 196 — ABNORMAL HIGH
Glucose-Capillary: 213 — ABNORMAL HIGH
Glucose-Capillary: 217 — ABNORMAL HIGH
Glucose-Capillary: 240 — ABNORMAL HIGH

## 2011-04-25 LAB — TYPE AND SCREEN: ABO/RH(D): A POS

## 2011-04-25 LAB — URINALYSIS, ROUTINE W REFLEX MICROSCOPIC
Bilirubin Urine: NEGATIVE
Glucose, UA: NEGATIVE
Hgb urine dipstick: NEGATIVE
Ketones, ur: NEGATIVE
Protein, ur: NEGATIVE
pH: 5

## 2011-04-25 LAB — CBC
HCT: 31.4 — ABNORMAL LOW
HCT: 40.5
MCHC: 35
MCV: 91.4
Platelets: 189
Platelets: 195
Platelets: 228
Platelets: 231
RBC: 4.43
RDW: 13
RDW: 13.1
RDW: 13.1
WBC: 9.4

## 2011-04-25 LAB — COMPREHENSIVE METABOLIC PANEL
AST: 22
Albumin: 4.1
Alkaline Phosphatase: 29 — ABNORMAL LOW
BUN: 14
CO2: 24
Chloride: 105
Creatinine, Ser: 0.54
GFR calc non Af Amer: >60
Potassium: 4.2
Total Bilirubin: 0.7

## 2011-04-25 LAB — BASIC METABOLIC PANEL
BUN: 5 — ABNORMAL LOW
BUN: 9
CO2: 26
Calcium: 8 — ABNORMAL LOW
Calcium: 8.7
Calcium: 8.7
Creatinine, Ser: 0.6
Creatinine, Ser: 0.62
GFR calc non Af Amer: >60
GFR calc non Af Amer: >60
Glucose, Bld: 167 — ABNORMAL HIGH
Glucose, Bld: 184 — ABNORMAL HIGH
Glucose, Bld: 199 — ABNORMAL HIGH
Potassium: 3.4 — ABNORMAL LOW
Sodium: 135

## 2011-04-25 LAB — DIFFERENTIAL
Basophils Absolute: 0
Basophils Relative: 0
Eosinophils Relative: 2
Monocytes Absolute: 0.7
Neutro Abs: 6.3

## 2011-04-25 LAB — URINE MICROSCOPIC-ADD ON

## 2011-04-25 LAB — ABO/RH: ABO/RH(D): A POS

## 2011-04-25 MED ORDER — METFORMIN HCL 1000 MG PO TABS: 1000.0000 mg | ORAL_TABLET | Freq: Two times a day (BID) | ORAL | Status: DC

## 2011-04-25 NOTE — Telephone Encounter (Signed)
Rx sent 

## 2011-04-25 NOTE — Telephone Encounter (Signed)
Patient need refill metformin 1000mg  twice a day - [facility] - patient has cpx scheduled [phone]

## 2011-05-08 ENCOUNTER — Encounter: Payer: Medicare Other | Admitting: Internal Medicine

## 2011-06-01 ENCOUNTER — Ambulatory Visit (INDEPENDENT_AMBULATORY_CARE_PROVIDER_SITE_OTHER): Payer: Medicare Other | Admitting: Internal Medicine

## 2011-06-01 ENCOUNTER — Encounter: Payer: Self-pay | Admitting: Internal Medicine

## 2011-06-01 VITALS — BP 118/70 | HR 77 | Temp 98.4°F | Wt 176.2 lb

## 2011-06-01 DIAGNOSIS — J209 Acute bronchitis, unspecified: Secondary | ICD-10-CM

## 2011-06-01 MED ORDER — CLONAZEPAM 0.5 MG PO TABS: 0.5000 mg | ORAL_TABLET | Freq: Every day | ORAL | Status: DC

## 2011-06-01 MED ORDER — HYDROCODONE-HOMATROPINE 5-1.5 MG/5ML PO SYRP: 5.0000 mL | ORAL_SOLUTION | Freq: Four times a day (QID) | ORAL | Status: AC | PRN

## 2011-06-01 MED ORDER — AMOXICILLIN 500 MG PO CAPS: 500.0000 mg | ORAL_CAPSULE | Freq: Three times a day (TID) | ORAL | Status: AC

## 2011-06-01 NOTE — Progress Notes (Signed)
  Subjective:    Patient ID: [name], female    DOB: [birth year], 70 y.o.   MRN: [id number]  HPI Respiratory tract infection Onset/symptoms:11/4 as ST Exposures (illness/environmental/extrinsic):great grand child had otitis Progression of symptoms:to head& chest Treatments/response:Tylenol Cold , Robitussin with some benefit Present symptoms: Fever/chills/sweats:isolated fever, sweats Frontal headache:no Facial pain:no Nasal purulence:no Dental pain:no Lymphadenopathy:no Wheezing/shortness of breath:no Cough/sputum/hemoptysis:yellow sputum Past medical history: Seasonal allergies; yes/asthma:no Smoking history:never           Review of Systems     Objective:   Physical Exam General appearance is of good health and nourishment; no acute distress or increased work of breathing is present.  No  lymphadenopathy about the head, neck, or axilla noted.   Eyes: No conjunctival inflammation or lid edema is present.  Ears:  External ear exam shows no significant lesions or deformities.  Otoscopic examination reveals clear canals, tympanic membranes are intact bilaterally without bulging, retraction, inflammation or discharge.  Nose:  External nasal examination shows no deformity or inflammation. Nasal mucosa are pink and moist without lesions or exudates. No septal dislocation .No obstruction to airflow.   Oral exam: Dental hygiene is good; lips and gums are healthy appearing.There is no oropharyngeal erythema or exudate noted.  Hoarse    Heart:  Normal rate and regular rhythm. S1 and S2 normal without gallop, murmur, click, rub . S4 Lungs:Chest clear to auscultation; no wheezes, rhonchi,rales ,or rubs present.No increased work of breathing.   Harsh repetitive cough  Extremities:  No cyanosis, edema, or clubbing  noted    Skin: Warm & dry           Assessment & Plan:  #1 rhonchi this, acute without symptoms or signs of rhinosinusitis  Plan: See orders and  recommendations

## 2011-06-01 NOTE — Patient Instructions (Signed)
Plain Mucinex for thick secretions ;force NON dairy fluids. Use a Neti pot daily as needed for sinus congestion  

## 2011-07-18 ENCOUNTER — Encounter: Payer: Self-pay | Admitting: Internal Medicine

## 2011-07-20 ENCOUNTER — Ambulatory Visit (INDEPENDENT_AMBULATORY_CARE_PROVIDER_SITE_OTHER): Payer: Medicare Other | Admitting: Internal Medicine

## 2011-07-20 ENCOUNTER — Encounter: Payer: Self-pay | Admitting: Internal Medicine

## 2011-07-20 VITALS — BP 116/78 | HR 71 | Temp 97.9°F | Resp 12 | Ht 66.0 in | Wt 179.4 lb

## 2011-07-20 DIAGNOSIS — E782 Mixed hyperlipidemia: Secondary | ICD-10-CM

## 2011-07-20 DIAGNOSIS — E041 Nontoxic single thyroid nodule: Secondary | ICD-10-CM

## 2011-07-20 DIAGNOSIS — Z0000 Encounter for general adult medical examination without abnormal findings: Secondary | ICD-10-CM

## 2011-07-20 DIAGNOSIS — I10 Essential (primary) hypertension: Secondary | ICD-10-CM

## 2011-07-20 DIAGNOSIS — E559 Vitamin D deficiency, unspecified: Secondary | ICD-10-CM

## 2011-07-20 DIAGNOSIS — E119 Type 2 diabetes mellitus without complications: Secondary | ICD-10-CM

## 2011-07-20 NOTE — Assessment & Plan Note (Signed)
Both fasting and two-hour post meal glucoses are reported as at goal. Rare hypoglycemia is related to disruption of her normal meal intake pattern. A1c and urine microalbumin will be checked. She is continuing annual ophthalmologic exams

## 2011-07-20 NOTE — Assessment & Plan Note (Signed)
She's been compliant with her statin. Lipids will be scheduled. Minimal LDL goal is less than 100; ideal is less than 70

## 2011-07-20 NOTE — Progress Notes (Signed)
Subjective:    Patient ID: [name], female    DOB: 20-Jun-1941, 70 y.o.   MRN: [id number]  HPI Medicare Wellness Visit:  The following psychosocial & medical history were reviewed as required by Medicare.   Social history: caffeine: none , alcohol:  none ,  tobacco use : never  & exercise : water aerobics 3 X / week .   Home & personal  safety / fall risk: no issues of significance, activities of daily living: no limitations , seatbelt use : yes , and smoke alarm employment : yes .  Power of Attorney/Living Will status : yes  Vision ( as recorded per Nurse) & Hearing  evaluation :  Ophth seen 10/12, no retinopathy.Whisper heard @ 6 ft Orientation :oriented X 3 , memory & recall :good, spelling  testing: good,and mood & affect : normal . Depression / anxiety: no issues Travel history : last 2007 [place] , immunization status :up to date , transfusion history:  no, and preventive health surveillance ( colonoscopies, BMD , etc as per protocol/ [facility]): colonoscopy due 2015, Dental care:  Seen every 6 mos . Chart reviewed &  Updated. Active issues reviewed & addressed.       Review of Systems HYPERTENSION: Disease Monitoring: Blood pressure 120+/ 70 Chest pain, palpitations- no       Dyspnea- no Medications: Compliance- yes  Lightheadedness,Syncope- no    Edema- no  DIABETES: Disease Monitoring: Blood Sugar ranges-FBS 125-140; 2 hr post meal < 180. Polyuria/phagia/dipsia- frequency       Visual problems- no Medications: Compliance- yes  Hypoglycemic symptoms- rare approx 3 pm  if lunch missed  HYPERLIPIDEMIA: Disease Monitoring: See symptoms for Hypertension Medications: Compliance- yes  Abd pain, bowel changes- occasional watery stool with b'fast away from home  Muscle aches- no.  Her urologist plans cystoscopy to evaluate a lesion found during evaluation of repeated urinary tract infections.      Objective:   Physical Exam Gen.: Healthy and well-nourished in  appearance. Alert, appropriate and cooperative throughout exam. Appears younger than stated age  Head: Normocephalic without obvious abnormalities Eyes: No corneal or conjunctival inflammation noted. Pupils equal round reactive to light and accommodation.  Extraocular motion intact. Vision grossly normal. Ears: External  ear exam reveals no significant lesions or deformities. Canals clear .TMs normal.  Nose: External nasal exam reveals no deformity or inflammation. Nasal mucosa are pink and moist. No lesions or exudates noted.  Mouth: Oral mucosa and oropharynx reveal no lesions or exudates. Teeth in good repair. Neck: No deformities, masses, or tenderness noted. Range of motion  Normal. Thyroid : physiologic asymmetry. Lungs: Normal respiratory effort; chest expands symmetrically. Lungs are clear to auscultation without rales, wheezes, or increased work of breathing. Heart: Normal rate and rhythm. Normal S1 and S2. No gallop, click, or rub. Grade 1/2 -1 systolic  murmur. Abdomen: Bowel sounds normal; abdomen soft and nontender. No masses, organomegaly or hernias noted. Genitalia: Dr Vincente Poli   .                                                                                   Musculoskeletal/extremities: No deformity or scoliosis noted of  the thoracic or lumbar spine. No clubbing, cyanosis, edema noted. Range of motion markedly decreased @ knees .Tone & strength  normal.Joints : crepitus knees; minor DJD finger changes. Nail health  good. Vascular: Carotid, radial artery, dorsalis pedis and  posterior tibial pulses are full and equal. No bruits present. Neurologic: Alert and oriented x3. Deep tendon reflexes symmetrical and normal.          Skin: Intact without suspicious lesions or rashes. Lymph: No cervical, axillary lymphadenopathy present. Psych: Mood and affect are normal. Normally interactive                                                                                           Assessment & Plan:  #1 Medicare Wellness Exam; criteria met ; data entered #2 Problem List reviewed ; Assessment/ Recommendations made Plan: see Orders

## 2011-07-20 NOTE — Patient Instructions (Addendum)
Preventive Health Care: Exercise  30-45  minutes a day, 3-4 days a week. Walking is especially valuable in preventing Osteoporosis. Eat a low-fat diet with lots of fruits and vegetables, up to 7-9 servings per day. Consume less than 30 grams of sugar per day from foods & drinks with High Fructose Corn Syrup as # 1,2,3 or #4 on label. Please  schedule fasting Labs : BMET,Lipids, hepatic panel, TSH, vitamin D. PLEASE BRING THESE INSTRUCTIONS TO FOLLOW UP  LAB APPOINTMENT.This will guarantee correct labs are drawn, eliminating need for repeat blood sampling ( needle sticks ! ). Diagnoses /Codes: 250.00, 272.4, 401.9 , 268.9

## 2011-07-20 NOTE — Assessment & Plan Note (Signed)
By report blood pressure is well controlled at home ;her chemistries will be checked.

## 2011-07-25 ENCOUNTER — Other Ambulatory Visit: Payer: Self-pay | Admitting: Urology

## 2011-07-30 ENCOUNTER — Other Ambulatory Visit: Payer: Self-pay | Admitting: Internal Medicine

## 2011-07-30 DIAGNOSIS — E785 Hyperlipidemia, unspecified: Secondary | ICD-10-CM

## 2011-07-30 DIAGNOSIS — E559 Vitamin D deficiency, unspecified: Secondary | ICD-10-CM

## 2011-07-30 DIAGNOSIS — E119 Type 2 diabetes mellitus without complications: Secondary | ICD-10-CM

## 2011-07-30 DIAGNOSIS — I10 Essential (primary) hypertension: Secondary | ICD-10-CM

## 2011-07-31 ENCOUNTER — Other Ambulatory Visit (INDEPENDENT_AMBULATORY_CARE_PROVIDER_SITE_OTHER): Payer: Medicare Other

## 2011-07-31 DIAGNOSIS — E559 Vitamin D deficiency, unspecified: Secondary | ICD-10-CM | POA: Diagnosis not present

## 2011-07-31 DIAGNOSIS — I10 Essential (primary) hypertension: Secondary | ICD-10-CM

## 2011-07-31 DIAGNOSIS — E041 Nontoxic single thyroid nodule: Secondary | ICD-10-CM

## 2011-07-31 DIAGNOSIS — E785 Hyperlipidemia, unspecified: Secondary | ICD-10-CM | POA: Diagnosis not present

## 2011-07-31 DIAGNOSIS — E119 Type 2 diabetes mellitus without complications: Secondary | ICD-10-CM | POA: Diagnosis not present

## 2011-07-31 LAB — LIPID PANEL
Cholesterol: 130 mg/dL (ref 0–200)
LDL Cholesterol: 63 mg/dL (ref 0–99)
Triglycerides: 81 mg/dL (ref 0.0–149.0)
VLDL: 16.2 mg/dL (ref 0.0–40.0)

## 2011-07-31 LAB — HEPATIC FUNCTION PANEL
ALT: 19 U/L (ref 0–35)
Albumin: 4.2 g/dL (ref 3.5–5.2)
Alkaline Phosphatase: 39 U/L (ref 39–117)
Bilirubin, Direct: 0.1 mg/dL (ref 0.0–0.3)
Total Protein: 6.9 g/dL (ref 6.0–8.3)

## 2011-07-31 LAB — BASIC METABOLIC PANEL
BUN: 15 mg/dL (ref 6–23)
Chloride: 105 mEq/L (ref 96–112)
GFR: 115.17 mL/min (ref >60.00)
Potassium: 4.1 mEq/L (ref 3.5–5.1)

## 2011-08-01 LAB — VITAMIN D 25 HYDROXY (VIT D DEFICIENCY, FRACTURES): Vit D, 25-Hydroxy: 41 ng/mL (ref 30–89)

## 2011-08-02 ENCOUNTER — Ambulatory Visit
Admission: RE | Admit: 2011-08-02 | Discharge: 2011-08-02 | Disposition: A | Discharge status: Home / Self Care | Payer: Medicare Other | Source: Ambulatory Visit | Attending: Internal Medicine | Admitting: Internal Medicine

## 2011-08-02 DIAGNOSIS — E042 Nontoxic multinodular goiter: Secondary | ICD-10-CM | POA: Diagnosis not present

## 2011-08-09 ENCOUNTER — Other Ambulatory Visit: Payer: Self-pay | Admitting: Internal Medicine

## 2011-08-09 ENCOUNTER — Other Ambulatory Visit: Payer: Self-pay

## 2011-08-09 DIAGNOSIS — E041 Nontoxic single thyroid nodule: Secondary | ICD-10-CM

## 2011-08-09 MED ORDER — PRAVASTATIN SODIUM 40 MG PO TABS: 40.0000 mg | ORAL_TABLET | Freq: Every day | ORAL | Status: DC

## 2011-08-09 MED ORDER — CITALOPRAM HYDROBROMIDE 20 MG PO TABS: 20.0000 mg | ORAL_TABLET | Freq: Every day | ORAL | Status: DC

## 2011-08-09 MED ORDER — METFORMIN HCL 1000 MG PO TABS: 1000.0000 mg | ORAL_TABLET | Freq: Two times a day (BID) | ORAL | Status: DC

## 2011-08-09 MED ORDER — BENAZEPRIL HCL 20 MG PO TABS: 20.0000 mg | ORAL_TABLET | Freq: Every day | ORAL | Status: DC

## 2011-08-09 NOTE — Telephone Encounter (Signed)
Message left on voicemail:  1.) Patient would like refills sent to [facility] 2.) Patient is ok with Dr.Hopper ordering Biopsy of Thyroid, see U/S from 07/20/11  Dr.Hopper please place Biopsy order

## 2011-08-10 NOTE — Telephone Encounter (Signed)
Order was placed.

## 2011-08-15 ENCOUNTER — Other Ambulatory Visit (HOSPITAL_COMMUNITY)
Admission: RE | Admit: 2011-08-15 | Discharge: 2011-08-15 | Disposition: A | Discharge status: Home / Self Care | Payer: Medicare Other | Source: Ambulatory Visit | Attending: Interventional Radiology | Admitting: Interventional Radiology

## 2011-08-15 ENCOUNTER — Encounter (HOSPITAL_BASED_OUTPATIENT_CLINIC_OR_DEPARTMENT_OTHER): Payer: Self-pay | Admitting: *Deleted

## 2011-08-15 ENCOUNTER — Ambulatory Visit
Admission: RE | Admit: 2011-08-15 | Discharge: 2011-08-15 | Disposition: A | Discharge status: Home / Self Care | Payer: Medicare Other | Source: Ambulatory Visit | Attending: Internal Medicine | Admitting: Internal Medicine

## 2011-08-15 DIAGNOSIS — R6889 Other general symptoms and signs: Secondary | ICD-10-CM | POA: Insufficient documentation

## 2011-08-15 DIAGNOSIS — E041 Nontoxic single thyroid nodule: Secondary | ICD-10-CM

## 2011-08-15 HISTORY — PX: OTHER SURGICAL HISTORY: SHX169

## 2011-08-15 NOTE — Progress Notes (Signed)
NPO AFTER MN. ARRIVES AT 1115. NEEDS ISTAT. CURRENT EKG 07-20-11 W/ CHART.

## 2011-08-20 ENCOUNTER — Telehealth: Payer: Self-pay | Admitting: Internal Medicine

## 2011-08-20 NOTE — Telephone Encounter (Signed)
Dr.Hopper please advise 

## 2011-08-20 NOTE — Telephone Encounter (Signed)
Patient calling, states she received her Thyroid Biopsy results in the mail, and is now ready to be referred to an Endocrinologist.  She has no preference in who she is sent to.  Please enter referral.

## 2011-08-22 ENCOUNTER — Encounter (HOSPITAL_BASED_OUTPATIENT_CLINIC_OR_DEPARTMENT_OTHER)
Admission: RE | Disposition: A | Discharge status: Home / Self Care | Payer: Self-pay | Source: Ambulatory Visit | Attending: Urology

## 2011-08-22 ENCOUNTER — Ambulatory Visit (HOSPITAL_BASED_OUTPATIENT_CLINIC_OR_DEPARTMENT_OTHER)
Admission: RE | Admit: 2011-08-22 | Discharge: 2011-08-22 | Disposition: A | Discharge status: Home / Self Care | Payer: Medicare Other | Source: Ambulatory Visit | Attending: Urology | Admitting: Urology

## 2011-08-22 ENCOUNTER — Encounter (HOSPITAL_BASED_OUTPATIENT_CLINIC_OR_DEPARTMENT_OTHER): Payer: Self-pay | Admitting: Anesthesiology

## 2011-08-22 ENCOUNTER — Ambulatory Visit (HOSPITAL_BASED_OUTPATIENT_CLINIC_OR_DEPARTMENT_OTHER): Payer: Medicare Other | Admitting: Anesthesiology

## 2011-08-22 ENCOUNTER — Encounter (HOSPITAL_BASED_OUTPATIENT_CLINIC_OR_DEPARTMENT_OTHER): Payer: Self-pay | Admitting: *Deleted

## 2011-08-22 ENCOUNTER — Other Ambulatory Visit: Payer: Self-pay | Admitting: Urology

## 2011-08-22 DIAGNOSIS — R350 Frequency of micturition: Secondary | ICD-10-CM | POA: Insufficient documentation

## 2011-08-22 DIAGNOSIS — N3020 Other chronic cystitis without hematuria: Secondary | ICD-10-CM | POA: Diagnosis not present

## 2011-08-22 DIAGNOSIS — R3129 Other microscopic hematuria: Secondary | ICD-10-CM

## 2011-08-22 DIAGNOSIS — Z7982 Long term (current) use of aspirin: Secondary | ICD-10-CM | POA: Diagnosis not present

## 2011-08-22 DIAGNOSIS — Z8673 Personal history of transient ischemic attack (TIA), and cerebral infarction without residual deficits: Secondary | ICD-10-CM | POA: Diagnosis not present

## 2011-08-22 DIAGNOSIS — R351 Nocturia: Secondary | ICD-10-CM | POA: Insufficient documentation

## 2011-08-22 DIAGNOSIS — N329 Bladder disorder, unspecified: Secondary | ICD-10-CM | POA: Diagnosis not present

## 2011-08-22 DIAGNOSIS — M810 Age-related osteoporosis without current pathological fracture: Secondary | ICD-10-CM | POA: Insufficient documentation

## 2011-08-22 DIAGNOSIS — I10 Essential (primary) hypertension: Secondary | ICD-10-CM | POA: Diagnosis not present

## 2011-08-22 DIAGNOSIS — Z86718 Personal history of other venous thrombosis and embolism: Secondary | ICD-10-CM | POA: Insufficient documentation

## 2011-08-22 DIAGNOSIS — E785 Hyperlipidemia, unspecified: Secondary | ICD-10-CM | POA: Insufficient documentation

## 2011-08-22 DIAGNOSIS — R82998 Other abnormal findings in urine: Secondary | ICD-10-CM | POA: Diagnosis not present

## 2011-08-22 DIAGNOSIS — Z79899 Other long term (current) drug therapy: Secondary | ICD-10-CM | POA: Diagnosis not present

## 2011-08-22 HISTORY — DX: Occlusion and stenosis of unspecified carotid artery: I65.29

## 2011-08-22 HISTORY — DX: Anesthesia of skin: R20.0

## 2011-08-22 HISTORY — PX: CYSTOSCOPY W/ RETROGRADES: SHX1426

## 2011-08-22 HISTORY — DX: Benign and innocent cardiac murmurs: R01.0

## 2011-08-22 HISTORY — DX: Nocturia: R35.1

## 2011-08-22 HISTORY — PX: CYSTOSCOPY WITH BIOPSY: SHX5122

## 2011-08-22 HISTORY — DX: Frequency of micturition: R35.0

## 2011-08-22 HISTORY — DX: Personal history of transient ischemic attack (TIA), and cerebral infarction without residual deficits: Z86.73

## 2011-08-22 HISTORY — DX: Anxiety disorder, unspecified: F41.9

## 2011-08-22 HISTORY — DX: Anesthesia of skin: R20.2

## 2011-08-22 LAB — POCT I-STAT 4, (NA,K, GLUC, HGB,HCT)
HCT: 38 % (ref 36.0–46.0)
Potassium: 4.3 mEq/L (ref 3.5–5.1)
Sodium: 140 mEq/L (ref 135–145)

## 2011-08-22 SURGERY — CYSTOSCOPY, WITH BIOPSY
Anesthesia: General | Site: Ureter | Wound class: Clean Contaminated

## 2011-08-22 MED ORDER — PROMETHAZINE HCL 25 MG/ML IJ SOLN: 6.2500 mg | INTRAMUSCULAR | Status: DC | PRN

## 2011-08-22 MED ORDER — FENTANYL CITRATE 0.05 MG/ML IJ SOLN
INTRAMUSCULAR | Status: DC | PRN
  Administered 2011-08-22: 50 ug via INTRAVENOUS

## 2011-08-22 MED ORDER — SENNOSIDES-DOCUSATE SODIUM 8.6-50 MG PO TABS: 1.0000 | ORAL_TABLET | Freq: Two times a day (BID) | ORAL | Status: DC

## 2011-08-22 MED ORDER — HYOSCYAMINE SULFATE 0.125 MG PO TABS: 0.1250 mg | ORAL_TABLET | ORAL | Status: AC | PRN

## 2011-08-22 MED ORDER — DEXAMETHASONE SODIUM PHOSPHATE 4 MG/ML IJ SOLN
INTRAMUSCULAR | Status: DC | PRN
  Administered 2011-08-22: 5 mg via INTRAVENOUS

## 2011-08-22 MED ORDER — IOHEXOL 350 MG/ML SOLN
INTRAVENOUS | Status: DC | PRN
  Administered 2011-08-22: 50 mL

## 2011-08-22 MED ORDER — LACTATED RINGERS IV SOLN
INTRAVENOUS | Status: DC
  Administered 2011-08-22: 100 mL/h via INTRAVENOUS

## 2011-08-22 MED ORDER — OXYCODONE-ACETAMINOPHEN 5-325 MG PO TABS: 1.0000 | ORAL_TABLET | ORAL | Status: AC | PRN

## 2011-08-22 MED ORDER — KETOROLAC TROMETHAMINE 30 MG/ML IJ SOLN: 15.0000 mg | Freq: Once | INTRAMUSCULAR | Status: DC | PRN

## 2011-08-22 MED ORDER — LIDOCAINE HCL (CARDIAC) 20 MG/ML IV SOLN
INTRAVENOUS | Status: DC | PRN
  Administered 2011-08-22: 80 mg via INTRAVENOUS

## 2011-08-22 MED ORDER — BELLADONNA ALKALOIDS-OPIUM 16.2-60 MG RE SUPP
RECTAL | Status: DC | PRN
  Administered 2011-08-22: 1 via RECTAL

## 2011-08-22 MED ORDER — FENTANYL CITRATE 0.05 MG/ML IJ SOLN: 25.0000 ug | INTRAMUSCULAR | Status: DC | PRN

## 2011-08-22 MED ORDER — ONDANSETRON HCL 4 MG/2ML IJ SOLN
INTRAMUSCULAR | Status: DC | PRN
  Administered 2011-08-22: 4 mg via INTRAVENOUS

## 2011-08-22 MED ORDER — STERILE WATER FOR IRRIGATION IR SOLN
Status: DC | PRN
  Administered 2011-08-22: 3000 mL

## 2011-08-22 MED ORDER — PROPOFOL 10 MG/ML IV EMUL
INTRAVENOUS | Status: DC | PRN
  Administered 2011-08-22: 200 mg via INTRAVENOUS

## 2011-08-22 MED ORDER — CEFAZOLIN SODIUM 1-5 GM-% IV SOLN
1.0000 g | INTRAVENOUS | Status: AC
  Administered 2011-08-22: 1 g via INTRAVENOUS

## 2011-08-22 MED ORDER — PHENAZOPYRIDINE HCL 100 MG PO TABS: 100.0000 mg | ORAL_TABLET | Freq: Three times a day (TID) | ORAL | Status: AC | PRN

## 2011-08-22 SURGICAL SUPPLY — 37 items
ADAPTER CATH URET PLST 4-6FR (CATHETERS) ×3 IMPLANT
BAG DRAIN URO-CYSTO SKYTR STRL (DRAIN) ×3 IMPLANT
BASKET LASER NITINOL 1.9FR (BASKET) IMPLANT
BASKET STNLS GEMINI 4WIRE 3FR (BASKET) IMPLANT
BASKET ZERO TIP NITINOL 2.4FR (BASKET) IMPLANT
BRUSH URET BIOPSY 3F (UROLOGICAL SUPPLIES) IMPLANT
CANISTER SUCT LVC 12 LTR MEDI- (MISCELLANEOUS) ×3 IMPLANT
CATH INTERMIT  6FR 70CM (CATHETERS) IMPLANT
CATH URET 5FR 28IN CONE TIP (BALLOONS)
CATH URET 5FR 28IN OPEN ENDED (CATHETERS) ×3 IMPLANT
CATH URET 5FR 70CM CONE TIP (BALLOONS) IMPLANT
CLOTH BEACON ORANGE TIMEOUT ST (SAFETY) ×3 IMPLANT
DRAPE CAMERA CLOSED 9X96 (DRAPES) ×3 IMPLANT
ELECT REM PT RETURN 9FT ADLT (ELECTROSURGICAL) ×3
ELECTRODE REM PT RTRN 9FT ADLT (ELECTROSURGICAL) ×2 IMPLANT
GLOVE BIOGEL PI IND STRL 6.5 (GLOVE) ×4 IMPLANT
GLOVE BIOGEL PI INDICATOR 6.5 (GLOVE) ×2
GLOVE ECLIPSE 7.0 STRL STRAW (GLOVE) ×3 IMPLANT
GLOVE INDICATOR 7.5 STRL GRN (GLOVE) ×3 IMPLANT
GOWN PREVENTION PLUS LG XLONG (DISPOSABLE) ×3 IMPLANT
GOWN STRL REIN XL XLG (GOWN DISPOSABLE) ×3 IMPLANT
GUIDEWIRE 0.038 PTFE COATED (WIRE) IMPLANT
GUIDEWIRE ANG ZIPWIRE 038X150 (WIRE) IMPLANT
GUIDEWIRE STR DUAL SENSOR (WIRE) IMPLANT
IV NS IRRIG 3000ML ARTHROMATIC (IV SOLUTION) IMPLANT
KIT BALLIN UROMAX 15FX10 (LABEL) IMPLANT
KIT BALLN UROMAX 15FX4 (MISCELLANEOUS) IMPLANT
KIT BALLN UROMAX 26 75X4 (MISCELLANEOUS)
LASER FIBER DISP (UROLOGICAL SUPPLIES) IMPLANT
NEEDLE HYPO 22GX1.5 SAFETY (NEEDLE) IMPLANT
NS IRRIG 500ML POUR BTL (IV SOLUTION) IMPLANT
PACK CYSTOSCOPY (CUSTOM PROCEDURE TRAY) ×3 IMPLANT
SET HIGH PRES BAL DIL (LABEL)
SHEATH URET ACCESS 12FR/35CM (UROLOGICAL SUPPLIES) IMPLANT
SHEATH URET ACCESS 12FR/55CM (UROLOGICAL SUPPLIES) IMPLANT
SYRINGE IRR TOOMEY STRL 70CC (SYRINGE) IMPLANT
WATER STERILE IRR 3000ML UROMA (IV SOLUTION) ×6 IMPLANT

## 2011-08-22 NOTE — Anesthesia Postprocedure Evaluation (Signed)
  Anesthesia Post-op Note  Patient: [name]  Procedure(s) Performed:  CYSTOSCOPY WITH BIOPSY; CYSTOSCOPY WITH RETROGRADE PYELOGRAM  Patient Location: PACU  Anesthesia Type: General  Level of Consciousness: awake and alert   Airway and Oxygen Therapy: Patient Spontanous Breathing  Post-op Pain: mild  Post-op Assessment: Post-op Vital signs reviewed, Patient's Cardiovascular Status Stable, Respiratory Function Stable, Patent Airway and No signs of Nausea or vomiting  Post-op Vital Signs: stable  Complications: No apparent anesthesia complications

## 2011-08-22 NOTE — Transfer of Care (Signed)
Immediate Anesthesia Transfer of Care Note  Patient: [name]  Procedure(s) Performed:  CYSTOSCOPY WITH BIOPSY; CYSTOSCOPY WITH RETROGRADE PYELOGRAM  Patient Location: PACU  Anesthesia Type: General  Level of Consciousness: drowsy, oriented and responsive  Airway & Oxygen Therapy: Patient Spontanous Breathing and Patient connected to face mask oxygen  Post-op Assessment: Report given to PACU RN and Post -op Vital signs reviewed and stable  Post vital signs: Reviewed and stable  Complications: No apparent anesthesia complications

## 2011-08-22 NOTE — Anesthesia Preprocedure Evaluation (Signed)
Anesthesia Evaluation  Patient identified by MRN, date of birth, ID band Patient awake    Reviewed: Allergy & Precautions, H&P , NPO status , Patient's Chart, lab work & pertinent test results  Airway Mallampati: II TM Distance: >3 FB Neck ROM: Full    Dental No notable dental hx.    Pulmonary neg pulmonary ROS,  clear to auscultation  Pulmonary exam normal       Cardiovascular hypertension, Regular Normal    Neuro/Psych Anxiety CVA, Residual Symptoms Negative Psych ROS   GI/Hepatic negative GI ROS, Neg liver ROS,   Endo/Other  Diabetes mellitus-, Type 2, Oral Hypoglycemic Agents  Renal/GU negative Renal ROS  Genitourinary negative   Musculoskeletal negative musculoskeletal ROS (+)   Abdominal   Peds negative pediatric ROS (+)  Hematology negative hematology ROS (+)   Anesthesia Other Findings   Reproductive/Obstetrics negative OB ROS                           Anesthesia Physical Anesthesia Plan  ASA: III  Anesthesia Plan: General   Post-op Pain Management:    Induction: Intravenous  Airway Management Planned: LMA  Additional Equipment:   Intra-op Plan:   Post-operative Plan:   Informed Consent:   Dental advisory given  Plan Discussed with: CRNA  Anesthesia Plan Comments:         Anesthesia Quick Evaluation

## 2011-08-22 NOTE — H&P (Signed)
Urology History and Physical Exam  CC: Microscopic hematuria  HPI: 71 year old female.  Work up for microscopic hematuria with office cystoscopy by Dr. Aldean Ast revealed and erythematous area.  She has had 2 separate urine cytology studies which were atypical.  Her risk for urinary malignancy is increased due to her age and greater than 20 years of exposure to 2nd hand smoke.  She had a CT hematuria protocol October 2011 which showed the upper tracts were negative for concerning findings.  She presents today for cystoscopy, possible bilateral retrograde pyelograms, and mapping bladder biopsies. We have discussed the risks, benefits, alternatives, and likelihood of achieving her goals.  She has a history of stroke and is cleared by her neurologist, Dr. Anne Hahn, to be off the aspirin.   PMH: Past Medical History  Diagnosis Date  . Hyperlipidemia   . Hypertension   . Depression     PMH of  . Osteoporosis   . Thyroid disease     right thyroid nodule-- BX DONE 08-15-2011  . DVT (deep venous thrombosis) 2006    after arthroscopy  . Obesity     Redux therapy  . MRSA (methicillin resistant staph aureus) culture positive     X3; last post TKR  . History of CVA (cerebrovascular accident) NEUROLOGIST  DR WILLIS----  06-01-2010-  LEFT BASAL GANGLIA HEMORRAGE    RESIDUAL RIGHT HAND NUMBNESS/TINGLING  . Numbness and tingling in right hand RESIDUAL FROM CVA NOV 2011  . Diabetes mellitus ORAL MED  . Anxiety   . Asymptomatic carotid artery stenosis LEFT --  MOD. PER DR WILLIS NOTE OCT 2012  . Benign heart murmur   . Nocturia   . Frequency of urination   . Arthritis HANDS, NECK , BACK    PSH: Past Surgical History  Procedure Date  . Carpal tunnel release 2000    RIGHT  . G2 p2   . Knee arthroscopy     BILATERAL PRIOR TO TOTAL KNEE  . Total knee arthroplasty 05-26-2002       LEFT   AND RIGHT  03-30-2098  . Lumbar laminectomy 08-26-2009    L 4 - 5  . Cystoscope     to evaluate recurrent  UTIs  . Colonoscopy 1999 & 2005  . Abdominal hysterectomy 1973    Endometriosis & fibroid  . Left shoulder surg 1990'S  . Joint replacement 03-28-2006    LEFT THUMB  . Thyroid nodule bx 08-15-2011    RIGHT    Allergies: Allergies  Allergen Reactions  . Conjugated Estrogens     REACTION: WT GAIN F.C. CHANGES    Medications: No prescriptions prior to admission  Aspirin 81mg  Benazepril 20mg  Citalopram 20mg  Januvia Klonopin 0.5mg  Pravastatin 40mg  Vitamin D   Social History: History   Social History  . Marital Status: Widowed    Spouse Name: N/A    Number of Children: N/A  . Years of Education: N/A   Occupational History  . Not on file.   Social History Main Topics  . Smoking status: Never Smoker   . Smokeless tobacco: Never Used  . Alcohol Use: Yes      very rarely , < 1 glass wine / month  . Drug Use: No  . Sexually Active: Not on file   Other Topics Concern  . Not on file   Social History Narrative  . No narrative on file    Family History: Family History  Problem Relation Age of Onset  . Cancer Mother  BLADDER  . Diabetes Mother   . Cancer Sister     BRAIN  . Cancer Brother     PROSTATE  . Dementia Brother     ALZHEIMERS  . Diabetes Maternal Aunt   . Diabetes Maternal Uncle   . Diabetes Maternal Grandmother     Type 2  . Diabetes Maternal Grandfather     Type 2  . Stroke Paternal Grandfather   . Diabetes Sister 74    TYPE 1    Review of Systems: Positive: Diarrhea.  Negative: Chest pain, SOB, gross hematuria.  A further 10 point review of systems was negative except what is listed in the HPI.  Physical Exam:  General: No acute distress.  Awake. Head:  Normocephalic.  Atraumatic. ENT:  EOMI.  Mucous membranes moist Neck:  Supple.  No lymphadenopathy. CV:  S1 present. S2 present. Regular rate. Pulmonary: Equal effort bilaterally.  Clear to auscultation bilaterally. Abdomen: Soft.  Non-tender to palpation. Skin:  Normal  turgor.  No visible rash. Extremity: No gross deformity of bilateral upper extremities.  No gross deformity of    bilateral lower extremities. Neurologic: Alert. Appropriate mood.   Studies:  No results found for this basename: HGB:2,WBC:2,PLT:2 in the last 72 hours  No results found for this basename: NA:2,K:2,CL:2,CO2:2,BUN:2,CREATININE:2,CALCIUM:2,MAGNESIUM:2,GFRNONAA:2,GFRAA:2 in the last 72 hours   No results found for this basename: PT:2,INR:2,APTT:2 in the last 72 hours   No components found with this basename: ABG:2    Assessment:  Microscopic hematuria  Plan: -To the OR for cystoscopy, possible bilateral retrograde pyelograms, mapping bladder biopsies.

## 2011-08-22 NOTE — Brief Op Note (Signed)
08/22/2011  1:51 PM  PATIENT:  [name]  71 y.o. female  PRE-OPERATIVE DIAGNOSIS:  Atypical cytology, microscopic hematuria  POST-OPERATIVE DIAGNOSIS:  Atypical cytology, microscopic hematuria.  PROCEDURE:  Procedure(s): CYSTOSCOPY Bladder biopsy Bilateral RETROGRADE PYELOGRAM  SURGEON:  Surgeon(s): Milford Cage, MD  PHYSICIAN ASSISTANT:   ASSISTANTS: none   ANESTHESIA:   general  EBL:  Total I/O In: 100 [I.V.:100] Out: -   BLOOD ADMINISTERED:none  DRAINS: none   LOCAL MEDICATIONS USED:  LIDOCAINE 10CC jelly in urethra.  SPECIMEN:  Source of Specimen:  bladder.  DISPOSITION OF SPECIMEN:  PATHOLOGY  COUNTS:  YES  TOURNIQUET:  * No tourniquets in log *  DICTATION: .Other Dictation: Dictation Number [id number]  PLAN OF CARE: Discharge to home after PACU  PATIENT DISPOSITION:  PACU - hemodynamically stable.   Delay start of Pharmacological VTE agent (>24hrs) due to surgical blood loss or risk of bleeding:  Yes.

## 2011-08-23 ENCOUNTER — Other Ambulatory Visit: Payer: Self-pay | Admitting: Internal Medicine

## 2011-08-23 ENCOUNTER — Encounter (HOSPITAL_BASED_OUTPATIENT_CLINIC_OR_DEPARTMENT_OTHER): Payer: Self-pay | Admitting: Urology

## 2011-08-23 DIAGNOSIS — E041 Nontoxic single thyroid nodule: Secondary | ICD-10-CM

## 2011-08-23 NOTE — Op Note (Signed)
[name], [name] NO.:  [id number]  MEDICAL RECORD NO.:  [id number]  LOCATION:                               FACILITY:  [facility]  PHYSICIAN:  Natalia Leatherwood, MD    DATE OF BIRTH:  [birth year]  DATE OF PROCEDURE:  08/22/2011 DATE OF DISCHARGE:                              OPERATIVE REPORT   SURGEON:  Natalia Leatherwood, M.D.  ASSISTANT:  None.  PREOPERATIVE DIAGNOSIS:  Microscopic hematuria.  POSTOPERATIVE DIAGNOSIS:  Microscopic hematuria.  PROCEDURES PERFORMED: 1. Cystoscopy. 2. Bilateral retrograde pyelogram. 3. Mapping bladder biopsies.  ESTIMATED BLOOD LOSS:  None.  COMPLICATIONS:  None.  DRAINS:  None.  FINDINGS:  No bladder tumors.  No filling defects in bilateral upper collecting systems or ureters.  HISTORY OF PRESENT ILLNESS:  This is a 71 year old female who has a history of microscopic hematuria.  She had office cystoscopy performed by Dr. Aldean Ast in the past.  She also has some atypical cytology.  I have recommended going to the operating room for mapping bladder biopsies due to the atypical cytology.  Also recommended bilateral retrograde pyelograms because it has been 2011 since she had her upper tract imaged. She agreed and presented for that procedure.  PROCEDURE IN DETAIL:  Informed consent was obtained, the patient was taken to the operating room where she was placed in a supine position. IV antibiotics were infused and general anesthesia was induced.  She was placed in a dorsal lithotomy position, making sure to pad all pertinent neurovascular pressure points appropriately.  The genitals were then prepped and draped in the usual fashion.  Following this, time-out was performed, which the correct patient, surgical site, and procedure were identified and agreed upon by the team.  Following this, a rigid cystoscope was advanced through the urethra into the bladder.  The bladder was evaluated in a systematic fashion with a 30- and  70-degree lens.  There were no lesions noted throughout the bladder.  Attention was then turned to the right ureteral orifice.  It was cannulated with a 5-French open-ended ureteral catheter.  Retrograde pyelogram was obtained and there was no filling defects in the ureters or the kidney. This kidney emptied the contrast out well.  Attention was then turned to the left ureteral orifice.  It was cannulated with a 5-French open-ended Pollock catheter and a retrograde pyelogram was obtained as well.  Note should be made that I injected the contrast on both of these pyelograms. There were no filling defects in the upper collecting tract on the left side or the left ureter.  After this was complete, a cold cup biopsy forceps were used to perform mapping bladder biopsies with left lateral bladder wall, right lateral bladder wall, posterior bladder base, bladder dome and trigone.  All these were sent separately for pathology. After this was done, a Bugbee was used to fulgurate the areas of biopsy. There was good hemostasis.  Her bladder was emptied.  B and O suppository was placed into her rectum and 10 cc of lidocaine jelly were placed into the urethra.  She had placed back in supine position. Anesthesia was reversed.  She was taken to the  PACU in stable condition. The patient will follow up in 1 week with the physician's assistant.          ______________________________ Natalia Leatherwood, MD     DW/MEDQ  D:  08/22/2011  T:  08/23/2011  Job:  [id number]

## 2011-08-29 DIAGNOSIS — R3129 Other microscopic hematuria: Secondary | ICD-10-CM | POA: Diagnosis not present

## 2011-08-29 DIAGNOSIS — R82998 Other abnormal findings in urine: Secondary | ICD-10-CM | POA: Diagnosis not present

## 2011-09-06 ENCOUNTER — Ambulatory Visit (INDEPENDENT_AMBULATORY_CARE_PROVIDER_SITE_OTHER): Payer: Medicare Other | Admitting: Endocrinology

## 2011-09-06 ENCOUNTER — Encounter: Payer: Self-pay | Admitting: Endocrinology

## 2011-09-06 DIAGNOSIS — E041 Nontoxic single thyroid nodule: Secondary | ICD-10-CM

## 2011-09-06 NOTE — Patient Instructions (Signed)
most of the time, an inflamed thyroid will eventually become underactive.  this is usually a slow process, happening over the span of many years. Please return in 1 year.

## 2011-09-06 NOTE — Progress Notes (Signed)
Subjective:    Patient ID: [name], female    DOB: [birth year], 71 y.o.   MRN: [id number]  HPI In 2010, pt fell and suffered a facial injury.  CT incidentally noted a thyroid nodule.  She has few years of slight intermittent "choking" sensation in the neck, but no assoc swelling.  Past Medical History  Diagnosis Date  . Hyperlipidemia   . Hypertension   . Depression     PMH of  . Osteoporosis   . Thyroid disease     right thyroid nodule-- BX DONE 08-15-2011  . DVT (deep venous thrombosis) 2006    after arthroscopy  . Obesity     Redux therapy  . MRSA (methicillin resistant staph aureus) culture positive     X3; last post TKR  . History of CVA (cerebrovascular accident) NEUROLOGIST  DR WILLIS----  06-01-2010-  LEFT BASAL GANGLIA HEMORRAGE    RESIDUAL RIGHT HAND NUMBNESS/TINGLING  . Numbness and tingling in right hand RESIDUAL FROM CVA NOV 2011  . Diabetes mellitus ORAL MED  . Anxiety   . Asymptomatic carotid artery stenosis LEFT --  MOD. PER DR WILLIS NOTE OCT 2012  . Benign heart murmur   . Nocturia   . Frequency of urination   . Arthritis HANDS, NECK , BACK    Past Surgical History  Procedure Date  . Carpal tunnel release 2000    RIGHT  . G2 p2   . Knee arthroscopy     BILATERAL PRIOR TO TOTAL KNEE  . Total knee arthroplasty 05-26-2002       LEFT   AND RIGHT  03-30-2098  . Lumbar laminectomy 08-26-2009    L 4 - 5  . Cystoscope     to evaluate recurrent UTIs  . Colonoscopy 1999 & 2005  . Abdominal hysterectomy 1973    Endometriosis & fibroid  . Left shoulder surg 1990'S  . Joint replacement 03-28-2006    LEFT THUMB  . Thyroid nodule bx 08-15-2011    RIGHT  . Cystoscopy with biopsy 08/22/2011    Procedure: CYSTOSCOPY WITH BIOPSY;  Surgeon: Milford Cage, MD;  Location: [facility];  Service: Urology;  Laterality: N/A;  . Cystoscopy w/ retrogrades 08/22/2011    Procedure: CYSTOSCOPY WITH RETROGRADE PYELOGRAM;  Surgeon: Milford Cage, MD;  Location[facility]  Service: Urology;  Laterality: Bilateral;    History   Social History  . Marital Status: Widowed    Spouse Name: N/A    Number of Children: N/A  . Years of Education: N/A   Occupational History  . Not on file.   Social History Main Topics  . Smoking status: Never Smoker   . Smokeless tobacco: Never Used  . Alcohol Use: Yes      very rarely , < 1 glass wine / month  . Drug Use: No  . Sexually Active: Not on file   Other Topics Concern  . Not on file   Social History Narrative  . No narrative on file    Current Outpatient Prescriptions on File Prior to Visit  Medication Sig Dispense Refill  . benazepril (LOTENSIN) 20 MG tablet Take 1 tablet (20 mg total) by mouth daily.  90 tablet  3  . Calcium-Vitamin D 600-200 MG-UNIT per tablet Take 1 tablet by mouth daily.       . cholecalciferol (VITAMIN D) 400 UNITS TABS Take 400 Units by mouth daily.      . citalopram (CELEXA) 20  MG tablet Take 1 tablet (20 mg total) by mouth daily.  90 tablet  3  . clonazePAM (KLONOPIN) 0.5 MG tablet Take 1 tablet (0.5 mg total) by mouth at bedtime.  90 tablet  0  . glucose blood (FREESTYLE LITE) test strip 1 each by Other route as needed. Use as instructed       . metFORMIN (GLUCOPHAGE) 1000 MG tablet Take 1 tablet (1,000 mg total) by mouth 2 (two) times daily with a meal.  180 tablet  1  . pravastatin (PRAVACHOL) 40 MG tablet Take 1 tablet (40 mg total) by mouth at bedtime.  90 tablet  3  . senna-docusate (SENOKOT S) 8.6-50 MG per tablet Take 1 tablet by mouth 2 (two) times daily.  60 tablet  0    Allergies  Allergen Reactions  . Conjugated Estrogens     REACTION: WT GAIN F.C. CHANGES    Family History  Problem Relation Age of Onset  . Cancer Mother     BLADDER  . Diabetes Mother   . Cancer Sister     BRAIN  . Cancer Brother     PROSTATE  . Dementia Brother     ALZHEIMERS  . Diabetes Maternal Aunt   . Diabetes Maternal Uncle   .  Diabetes Maternal Grandmother     Type 2  . Diabetes Maternal Grandfather     Type 2  . Stroke Paternal Grandfather   . Diabetes Sister 45    TYPE 1  sister had a goiter  BP 140/76  Pulse 68  Temp(Src) 96.9 F (36.1 C) (Oral)  Ht 5\' 6"  (1.676 m)  Wt 182 lb (82.555 kg)  BMI 29.38 kg/m2  SpO2 96%    Review of Systems denies neck pain, weight loss, hoarseness, double vision, palpitations, sob, diarrhea, polyuria, myalgias, excessive diaphoresis, numbness, tremor, anxiety, menopausal sxs, and easy bruising.  She has intermittent headache, rhinorrhea, and urinary frequency.  Anxiety is well-controlled.    Objective:   Physical Exam VS: see vs page GEN: no distress HEAD: head: no deformity eyes: no periorbital swelling, no proptosis external nose and ears are normal mouth: no lesion seen NECK: supple, thyroid is not enlarged.  i cannot appreciate the nodule seen on [place]. CHEST WALL: no deformity LUNGS:  Clear to auscultation CV: reg rate and rhythm, no murmur MUSCULOSKELETAL: muscle bulk and strength are grossly normal.  no obvious joint swelling.  gait is normal and steady EXTEMITIES: no deformity of the hands.  no edema of the legs.   NEURO:  cn 2-12 grossly intact.   readily moves all 4's.  sensation is intact to touch on the feet  No tremor. SKIN:  Normal texture and temperature.  No rash or suspicious lesion is visible.   NODES:  None palpable at the neck.   PSYCH: alert, oriented x3.  Does not appear anxious nor depressed.   Thyroid bx (2010) The specimen consists of small to medium size groups of banal appearing follicular epithelial cells admixed with mild inflammation. Cytologic features of papillary thyroid carcinoma are not identified.  thyroid US: Slight increase in size of the solid nodule in the lower pole of  the right lobe of thyroid measuring 2.5 x 1.7 x 1.6 cm compared to  prior measurements of 1.9 x 1.3 x 1.6 cm.   Lab Results  Component Value Date    TSH 3.63 07/31/2011      Assessment & Plan:  Chronic thyroiditis.  She is at high risk for  hypothyroidism Thyroid nodule, due to chronic thyroiditis.  Slightly bigger.  We can continue to follow "choking sensation," not thyroid-related Anxiety, not thyroid-related

## 2011-09-18 ENCOUNTER — Other Ambulatory Visit: Payer: Self-pay | Admitting: *Deleted

## 2011-09-19 MED ORDER — CLONAZEPAM 0.5 MG PO TABS: 0.5000 mg | ORAL_TABLET | Freq: Every day | ORAL | Status: DC

## 2011-09-19 NOTE — Telephone Encounter (Signed)
RX sent, Dr.Hopper approved

## 2011-11-05 ENCOUNTER — Telehealth: Payer: Self-pay | Admitting: Family Medicine

## 2011-11-05 ENCOUNTER — Ambulatory Visit (INDEPENDENT_AMBULATORY_CARE_PROVIDER_SITE_OTHER): Payer: Medicare Other | Admitting: Emergency Medicine

## 2011-11-05 VITALS — BP 130/74 | HR 80 | Temp 98.2°F | Resp 16 | Ht 66.5 in | Wt 191.6 lb

## 2011-11-05 DIAGNOSIS — J029 Acute pharyngitis, unspecified: Secondary | ICD-10-CM | POA: Diagnosis not present

## 2011-11-05 DIAGNOSIS — J040 Acute laryngitis: Secondary | ICD-10-CM | POA: Diagnosis not present

## 2011-11-05 DIAGNOSIS — J40 Bronchitis, not specified as acute or chronic: Secondary | ICD-10-CM

## 2011-11-05 MED ORDER — AZITHROMYCIN 250 MG PO TABS: ORAL_TABLET | ORAL | Status: DC

## 2011-11-05 MED ORDER — AMOXICILLIN 500 MG PO CAPS: 500.0000 mg | ORAL_CAPSULE | Freq: Two times a day (BID) | ORAL | Status: AC

## 2011-11-05 MED ORDER — BENZONATATE 200 MG PO CAPS: 200.0000 mg | ORAL_CAPSULE | Freq: Three times a day (TID) | ORAL | Status: AC | PRN

## 2011-11-05 NOTE — Progress Notes (Signed)
  Subjective:    Patient ID: [name], female    DOB: 08/04/1940, 71 y.o.   MRN: [id number]  HPI patient enters with a chief complaint of hoarseness. She also has a sore throat. He did have a strep exposure. She has had a flu shot. She has recently developed a discoloration to her sputum. Her phlegm now has a yellowish color.    Review of Systems noncontributory except as relates to this illness    Objective:   Physical Exam Physical exam TMs are clear nose is normal. throat is slightly red. Chest exam reveals clear lung fields no dullness.       Assessment & Plan:  I suspect this is either a allergic rhinitis or upper respiratory infection. She has now developed a productive cough of discolored phlegm.I initially prescribed zithromax since she had taken it before. I thought about her case. I called the patient and told her since she was on the celexa to not take anymore Zithromax[ she had already taken 2 pills] and I would call in a prescription for amoxicillen 500 BID which I did. She understood and will get the medicine tomorrow

## 2011-11-05 NOTE — Telephone Encounter (Signed)
Dr, Cleta Alberts called stating that he changed patients abx to Amox due to her being on Celexa, but needed [place] to remind to to not take her next 2 doses of Celexa.  Patient notified.

## 2011-12-05 DIAGNOSIS — I6529 Occlusion and stenosis of unspecified carotid artery: Secondary | ICD-10-CM | POA: Diagnosis not present

## 2011-12-26 DIAGNOSIS — M19049 Primary osteoarthritis, unspecified hand: Secondary | ICD-10-CM | POA: Diagnosis not present

## 2011-12-26 DIAGNOSIS — Z471 Aftercare following joint replacement surgery: Secondary | ICD-10-CM | POA: Diagnosis not present

## 2011-12-26 DIAGNOSIS — Z96659 Presence of unspecified artificial knee joint: Secondary | ICD-10-CM | POA: Diagnosis not present

## 2011-12-28 ENCOUNTER — Ambulatory Visit (INDEPENDENT_AMBULATORY_CARE_PROVIDER_SITE_OTHER): Payer: Medicare Other | Admitting: Internal Medicine

## 2011-12-28 ENCOUNTER — Encounter: Payer: Self-pay | Admitting: Internal Medicine

## 2011-12-28 VITALS — BP 120/76 | HR 65 | Temp 98.1°F | Wt 182.0 lb

## 2011-12-28 DIAGNOSIS — I10 Essential (primary) hypertension: Secondary | ICD-10-CM | POA: Diagnosis not present

## 2011-12-28 DIAGNOSIS — E559 Vitamin D deficiency, unspecified: Secondary | ICD-10-CM | POA: Diagnosis not present

## 2011-12-28 DIAGNOSIS — E782 Mixed hyperlipidemia: Secondary | ICD-10-CM | POA: Diagnosis not present

## 2011-12-28 DIAGNOSIS — E119 Type 2 diabetes mellitus without complications: Secondary | ICD-10-CM | POA: Diagnosis not present

## 2011-12-28 DIAGNOSIS — E041 Nontoxic single thyroid nodule: Secondary | ICD-10-CM

## 2011-12-28 LAB — HEMOGLOBIN A1C: Hgb A1c MFr Bld: 6.1 % — ABNORMAL HIGH (ref <5.7)

## 2011-12-28 MED ORDER — CLONAZEPAM 0.5 MG PO TABS: 0.5000 mg | ORAL_TABLET | Freq: Every day | ORAL | Status: DC

## 2011-12-28 NOTE — Assessment & Plan Note (Signed)
Monitor needed; new glucometer provided

## 2011-12-28 NOTE — Assessment & Plan Note (Signed)
Based on her reported blood pressure; closer monitor is indicated. Renal function was normal 08/10/11 with creatinine 0.6 and BUN of 15

## 2011-12-28 NOTE — Assessment & Plan Note (Signed)
Lipids were excellent 07/31/11; recheck 1/14

## 2011-12-28 NOTE — Progress Notes (Signed)
  Subjective:    Patient ID: [name], female    DOB: [birth year], 71 y.o.   MRN: [id number]  HPI HYPERTENSION: Disease Monitoring: Blood pressure range-she is somewhat vague about blood pressure recordings; she states it ranges 140-150/70 Chest pain, palpitations-no      Dyspnea- no Medications: Compliance-yes  Lightheadedness,Syncope-no   Edema- no  DIABETES: Disease Monitoring: Blood Sugar ranges-monitor malfunctioning  Polyuria/phagia/dipsia- frequency       Visual problems- no; ophthalmology evaluation current; no retinopathy Medications: Compliance- yes  Hypoglycemic symptoms- no   HYPERLIPIDEMIA: Disease Monitoring: See symptoms for Hypertension Medications: Compliance- yes Abd pain, bowel changes- no   Muscle aches- no but arthralgias. Vitamin D level was 41 in January          Review of Systems her urologist performed cystoscopy earlier this year to evaluate recurrent urinary tract infections. There was concern because of her mother's history of bladder cancer. At this time she is asymptomatic with no dysuria, pyuria, or hematuria.  Clonazepam has been of benefit for sleep disorder     Objective:   Physical Exam Gen.: Healthy  & well-nourished, appropriate and alert, weight Eyes: No lid/conjunctival changes, extraocular motion intact, fundi Neck: Normal range of motion, thyroid normal Respiratory: No increased work of breathing or abnormal breath sounds Cardiac : regular rhythm; no extra heart sounds, gallop, murmur Abdomen: No organomegaly ,masses, bruits or aortic enlargement Lymph: No lymphadenopathy of the neck or axilla Skin: No rashes, lesions, ulcers or ischemic changes Muscle skeletal: no nail changes; joints: Minor DIP osteoarthritic changes. Fusiform changes the knees with decreased range of motion Vasc:All pulses intact, no bruits present Neuro: Normal deep tendon reflexes, alert & oriented, sensation over feet normal Psych: judgment and  insight, mood and affect normal         Assessment & Plan:

## 2011-12-28 NOTE — Patient Instructions (Signed)
Please try to go on My Chart within the next 24 hours to allow me to release the results directly to you. Goals for home glucose monitoring are : fasting  or morning glucose goal of  100-150. Two hours after any meal , goal = < 180, preferably < 160. Report any low blood glucoses immediately.  Blood Pressure Goal  Ideally is an AVERAGE < 135/85. This AVERAGE should be calculated from @ least 5-7 BP readings taken @ different times of day on different days of week. You should not respond to isolated BP readings , but rather the AVERAGE for that week

## 2011-12-28 NOTE — Assessment & Plan Note (Addendum)
Vitamin D was 41 in 01/13; it had been 33 in 7/11

## 2011-12-28 NOTE — Progress Notes (Signed)
Addended by: Silvio Pate D on: 12/28/2011 04:26 PM   Modules accepted: Orders

## 2011-12-29 LAB — VITAMIN D 25 HYDROXY (VIT D DEFICIENCY, FRACTURES): Vit D, 25-Hydroxy: 37 ng/mL (ref 30–89)

## 2012-01-01 ENCOUNTER — Encounter: Payer: Self-pay | Admitting: *Deleted

## 2012-01-29 ENCOUNTER — Telehealth: Payer: Self-pay | Admitting: *Deleted

## 2012-01-29 MED ORDER — METFORMIN HCL 1000 MG PO TABS: 500.0000 mg | ORAL_TABLET | Freq: Two times a day (BID) | ORAL | Status: DC

## 2012-01-29 NOTE — Telephone Encounter (Signed)
Patient states that she was to have Rx for Metformin sent to [facility] pending results of lab work done at [facility] month ago: Notes Recorded by Verdene Rio, CMA on 01/01/2012 at 8:07 AM Letter Mail Notes Recorded by Pecola Lawless, MD on 12/30/2011 at 5:59 PM Please decrease the metformin to 1000 mg one half twice a day with the 2 largest meals. Recheck the A1c in 3 months.PLEASE BRING THESE INSTRUCTIONS TO FOLLOW UP LAB APPOINTMENT.This will guarantee correct labs are drawn, eliminating need for repeat blood sampling ( needle sticks ! ). Diagnoses /Codes: 250.00 Minimum vitamin D goal is 60. Please add 1000 international units of vitamin D 3 Monday, Wednesday, and Friday of each week to present dose.  Patient reports she did receive the letter with instructions in mail; understood & agreed, Rx sent to mail order pharmacy/[facility]

## 2012-02-01 ENCOUNTER — Telehealth: Payer: Self-pay | Admitting: Internal Medicine

## 2012-02-01 MED ORDER — METFORMIN HCL 1000 MG PO TABS: 500.0000 mg | ORAL_TABLET | Freq: Two times a day (BID) | ORAL | Status: DC

## 2012-02-01 NOTE — Telephone Encounter (Signed)
Spoke with patient, patient informed rx will be sent to [facility]

## 2012-02-01 NOTE — Telephone Encounter (Signed)
Rx for Metformin sent to [facility] per call from PT. [facility], can you re-send please  Patient call back [phone]

## 2012-02-14 DIAGNOSIS — M62838 Other muscle spasm: Secondary | ICD-10-CM | POA: Diagnosis not present

## 2012-02-14 DIAGNOSIS — IMO0002 Reserved for concepts with insufficient information to code with codable children: Secondary | ICD-10-CM | POA: Diagnosis not present

## 2012-02-14 DIAGNOSIS — M9981 Other biomechanical lesions of cervical region: Secondary | ICD-10-CM | POA: Diagnosis not present

## 2012-02-14 DIAGNOSIS — M999 Biomechanical lesion, unspecified: Secondary | ICD-10-CM | POA: Diagnosis not present

## 2012-02-21 DIAGNOSIS — M9981 Other biomechanical lesions of cervical region: Secondary | ICD-10-CM | POA: Diagnosis not present

## 2012-02-21 DIAGNOSIS — M62838 Other muscle spasm: Secondary | ICD-10-CM | POA: Diagnosis not present

## 2012-02-21 DIAGNOSIS — IMO0002 Reserved for concepts with insufficient information to code with codable children: Secondary | ICD-10-CM | POA: Diagnosis not present

## 2012-02-21 DIAGNOSIS — M999 Biomechanical lesion, unspecified: Secondary | ICD-10-CM | POA: Diagnosis not present

## 2012-03-13 DIAGNOSIS — Z1231 Encounter for screening mammogram for malignant neoplasm of breast: Secondary | ICD-10-CM | POA: Diagnosis not present

## 2012-03-20 DIAGNOSIS — M999 Biomechanical lesion, unspecified: Secondary | ICD-10-CM | POA: Diagnosis not present

## 2012-03-20 DIAGNOSIS — M9981 Other biomechanical lesions of cervical region: Secondary | ICD-10-CM | POA: Diagnosis not present

## 2012-03-20 DIAGNOSIS — M62838 Other muscle spasm: Secondary | ICD-10-CM | POA: Diagnosis not present

## 2012-03-20 DIAGNOSIS — IMO0002 Reserved for concepts with insufficient information to code with codable children: Secondary | ICD-10-CM | POA: Diagnosis not present

## 2012-03-27 DIAGNOSIS — M19049 Primary osteoarthritis, unspecified hand: Secondary | ICD-10-CM | POA: Diagnosis not present

## 2012-03-27 DIAGNOSIS — M6530 Trigger finger, unspecified finger: Secondary | ICD-10-CM | POA: Diagnosis not present

## 2012-04-08 ENCOUNTER — Encounter: Payer: Self-pay | Admitting: Internal Medicine

## 2012-04-17 DIAGNOSIS — M999 Biomechanical lesion, unspecified: Secondary | ICD-10-CM | POA: Diagnosis not present

## 2012-04-17 DIAGNOSIS — M9981 Other biomechanical lesions of cervical region: Secondary | ICD-10-CM | POA: Diagnosis not present

## 2012-04-17 DIAGNOSIS — M62838 Other muscle spasm: Secondary | ICD-10-CM | POA: Diagnosis not present

## 2012-04-17 DIAGNOSIS — IMO0002 Reserved for concepts with insufficient information to code with codable children: Secondary | ICD-10-CM | POA: Diagnosis not present

## 2012-06-11 ENCOUNTER — Telehealth: Payer: Self-pay | Admitting: Internal Medicine

## 2012-06-11 MED ORDER — CLONAZEPAM 0.5 MG PO TABS: 0.5000 mg | ORAL_TABLET | Freq: Every day | ORAL | Status: DC

## 2012-06-11 NOTE — Telephone Encounter (Signed)
RX sent (manually faxed)

## 2012-06-11 NOTE — Telephone Encounter (Signed)
Pt states she will be needing refill for clonazepam soon and would like to go ahead and get the process started. She uses [facility]. She has CPE scheduled for February.

## 2012-06-11 NOTE — Telephone Encounter (Signed)
Last filled on 12/28/2011 #90/1 refill. Hopp please advise if ok to send early. Instructions indicate 1 by mouth daily at bedtime, which would mean rx not due until Jan 2014

## 2012-06-11 NOTE — Telephone Encounter (Signed)
Refill #90 

## 2012-06-12 ENCOUNTER — Other Ambulatory Visit (INDEPENDENT_AMBULATORY_CARE_PROVIDER_SITE_OTHER): Payer: Medicare Other

## 2012-06-12 DIAGNOSIS — E119 Type 2 diabetes mellitus without complications: Secondary | ICD-10-CM

## 2012-06-12 LAB — HEMOGLOBIN A1C: Hgb A1c MFr Bld: 6.9 % — ABNORMAL HIGH (ref 4.6–6.5)

## 2012-06-30 ENCOUNTER — Other Ambulatory Visit: Payer: Self-pay | Admitting: Internal Medicine

## 2012-07-08 DIAGNOSIS — M19049 Primary osteoarthritis, unspecified hand: Secondary | ICD-10-CM | POA: Diagnosis not present

## 2012-07-12 ENCOUNTER — Other Ambulatory Visit: Payer: Self-pay | Admitting: Internal Medicine

## 2012-07-23 HISTORY — PX: WRIST SURGERY: SHX841

## 2012-07-24 ENCOUNTER — Encounter: Payer: Self-pay | Admitting: *Deleted

## 2012-08-08 DIAGNOSIS — M654 Radial styloid tenosynovitis [de Quervain]: Secondary | ICD-10-CM | POA: Diagnosis not present

## 2012-08-08 DIAGNOSIS — M19049 Primary osteoarthritis, unspecified hand: Secondary | ICD-10-CM | POA: Diagnosis not present

## 2012-08-14 DIAGNOSIS — M654 Radial styloid tenosynovitis [de Quervain]: Secondary | ICD-10-CM | POA: Diagnosis not present

## 2012-08-21 ENCOUNTER — Encounter: Payer: Self-pay | Admitting: Lab

## 2012-08-22 ENCOUNTER — Other Ambulatory Visit: Payer: Self-pay | Admitting: *Deleted

## 2012-08-22 ENCOUNTER — Encounter: Payer: Self-pay | Admitting: Internal Medicine

## 2012-08-22 ENCOUNTER — Ambulatory Visit (INDEPENDENT_AMBULATORY_CARE_PROVIDER_SITE_OTHER): Payer: Medicare Other | Admitting: Internal Medicine

## 2012-08-22 VITALS — Temp 98.0°F | Ht 65.08 in | Wt 187.0 lb

## 2012-08-22 DIAGNOSIS — R300 Dysuria: Secondary | ICD-10-CM

## 2012-08-22 DIAGNOSIS — Z0000 Encounter for general adult medical examination without abnormal findings: Secondary | ICD-10-CM

## 2012-08-22 DIAGNOSIS — E559 Vitamin D deficiency, unspecified: Secondary | ICD-10-CM

## 2012-08-22 DIAGNOSIS — N390 Urinary tract infection, site not specified: Secondary | ICD-10-CM

## 2012-08-22 DIAGNOSIS — E782 Mixed hyperlipidemia: Secondary | ICD-10-CM

## 2012-08-22 DIAGNOSIS — M810 Age-related osteoporosis without current pathological fracture: Secondary | ICD-10-CM

## 2012-08-22 DIAGNOSIS — I10 Essential (primary) hypertension: Secondary | ICD-10-CM

## 2012-08-22 DIAGNOSIS — E119 Type 2 diabetes mellitus without complications: Secondary | ICD-10-CM | POA: Diagnosis not present

## 2012-08-22 DIAGNOSIS — Z79899 Other long term (current) drug therapy: Secondary | ICD-10-CM | POA: Diagnosis not present

## 2012-08-22 LAB — POCT URINALYSIS DIPSTICK
Nitrite, UA: NEGATIVE
Protein, UA: NEGATIVE
pH, UA: 5

## 2012-08-22 MED ORDER — GLUCOSE BLOOD VI STRP: ORAL_STRIP | Status: DC

## 2012-08-22 MED ORDER — NITROFURANTOIN MONOHYD MACRO 100 MG PO CAPS: 100.0000 mg | ORAL_CAPSULE | Freq: Two times a day (BID) | ORAL | Status: DC

## 2012-08-22 MED ORDER — CLONAZEPAM 0.5 MG PO TABS: 0.5000 mg | ORAL_TABLET | Freq: Every day | ORAL | Status: DC

## 2012-08-22 MED ORDER — PRAVASTATIN SODIUM 40 MG PO TABS: 40.0000 mg | ORAL_TABLET | Freq: Every day | ORAL | Status: DC

## 2012-08-22 NOTE — Patient Instructions (Addendum)
Please  schedule fasting Labs in 3 weeks : BMET,Lipids, hepatic panel, TSH, A1c, urine microalbumin. PLEASE BRING THESE INSTRUCTIONS TO FOLLOW UP  LAB APPOINTMENT.This will guarantee correct labs are drawn, eliminating need for repeat blood sampling ( needle sticks ! ). Diagnoses /Codes: 250.00, 401.9,272.4.  If you activate My Chart; the results can be released to you as soon as they populate from the lab. If you choose not to use this program; the labs have to be reviewed, copied & mailed   causing a delay in getting the results to you.

## 2012-08-22 NOTE — Progress Notes (Signed)
Subjective:    Patient ID: Cristy Hilts, female    DOB: [birth year], 72 y.o.   MRN: [id number]  HPI Medicare Wellness Visit:  Psychosocial & medical history were reviewed as required by Medicare (abuse,antisocial behavioral risks,firearm risk).  Social history: caffeine:none  , alcohol:rarely   ,  tobacco use: never   Exercise : water aerobics 3X/ week & walking dog No home & personal  safety / fall risk Activities of daily living: no limitations  Seatbelt  and smoke alarm employed. Power of Attorney/Living Will status : in place Ophthalmology exam due Hearing evaluation not current Orientation :oriented X 3  Memory & recall :good Spelling  testing:good Mood & affect : normal . Depression / anxiety: denied Travel history : last 2008 [place]  Immunization status :current  Transfusion history:  none  Preventive health surveillance ( colonoscopies, BMD , mammograms,PAP as per protocol/ SOC): BMD Dental care:  Every 6 mos. Chart reviewed &  Updated. Active issues reviewed & addressed.       Review of Systems HYPERTENSION: Disease Monitoring: Blood pressure  not monitored Compliant with present antihypertensive regimen   DIABETES : Disease Monitoring: Blood Sugar: ranges 140-150 Medication Compliance: yes Diet: low salt, carb  Podiatry exam not current  Hypoglycemic symptoms: no  HYPERLIPIDEMIA: Diet & exercise as noted Medication Compliance:  Statin taken  No FH premature CAD / CVA   Past medical history/family history/social history were all reviewed and updated.    Signs & symptoms not present or negative as noted below: No chest pain, palpitations , claudications, PNDyspnea    No exertional dyspnea No lightheadedness or syncope   No edema No polyuria/phagia/dipsia; but dysuria X 4-5 days w/o hematuria or pyuria No limb numbness & tingling or weakness No non healing skin lesions      No blurred , double or loss of vision No abd pain, bowel changes No  significant myalgias                                                                                        Objective:   Physical Exam Gen.:  well-nourished in appearance. Alert, appropriate and cooperative throughout exam. Appears younger than stated age  Head: Normocephalic without obvious abnormalities  Eyes: No corneal or conjunctival inflammation noted.  Extraocular motion intact. Near vision contact OS. Ears: External  ear exam reveals no significant lesions or deformities. Canals clear .TMs normal. Hearing is grossly normal bilaterally. Nose: External nasal exam reveals no deformity or inflammation. Nasal mucosa are pink and moist. No lesions or exudates noted.   Mouth: Oral mucosa and oropharynx reveal no lesions or exudates. Teeth in good repair. Neck: No deformities, masses, or tenderness noted. Range of motion & thyroid normal. Lungs: Normal respiratory effort; chest expands symmetrically. Lungs are clear to auscultation without rales, wheezes, or increased work of breathing. Heart: Normal rate and rhythm. Normal S1 and S2. No gallop, click, or rub. Grade 1/2 over 6 systolic murmur LSB  Abdomen: Bowel sounds normal; abdomen soft and nontender. No masses, organomegaly or hernias noted. Genitalia: Dr Vincente Poli, [place] [facility]  Musculoskeletal/extremities: No deformity or scoliosis noted of  the thoracic or lumbar spine. No clubbing, cyanosis, edema, or significant extremity  deformity noted. Range of motion normal .Tone & strength  normal.Joints reveal minor DJD DIP changes. Nail health good. Able to lie down & sit up w/o help. Negative SLR bilaterally.Fusiform knees with crepitus & decreased ROM. RUE in half cast. Vascular: Carotid, radial artery, dorsalis pedis and  posterior tibial pulses are full and equal. No bruits present. Neurologic: Alert and oriented x3. Deep tendon reflexes symmetrical and normal. Skin: Intact without suspicious lesions or  rashes. Lymph: No cervical, axillary lymphadenopathy present. Psych: Mood and affect are normal. Normally interactive                                                                                       Assessment & Plan:  #1 Medicare Wellness Exam; criteria met ; data entered #2 Problem List reviewed ; Assessment/ Recommendations made Plan: see Orders

## 2012-08-25 ENCOUNTER — Other Ambulatory Visit: Payer: Self-pay | Admitting: Internal Medicine

## 2012-08-25 DIAGNOSIS — N390 Urinary tract infection, site not specified: Secondary | ICD-10-CM

## 2012-09-01 ENCOUNTER — Telehealth: Payer: Self-pay

## 2012-09-01 ENCOUNTER — Other Ambulatory Visit: Payer: Self-pay | Admitting: Internal Medicine

## 2012-09-01 DIAGNOSIS — N390 Urinary tract infection, site not specified: Secondary | ICD-10-CM

## 2012-09-01 NOTE — Telephone Encounter (Signed)
Patient's condition would warrant an infectious disease consult. Dr. Margarita Grizzle has evaluated the patient and nothing to explain frequent UTI's . If Dr.Hopper needs to call: [phone], Dr.Woodruff, Reuel Boom.

## 2012-09-02 ENCOUNTER — Ambulatory Visit (INDEPENDENT_AMBULATORY_CARE_PROVIDER_SITE_OTHER)
Admission: RE | Admit: 2012-09-02 | Discharge: 2012-09-02 | Disposition: A | Discharge status: Home / Self Care | Payer: Medicare Other | Source: Ambulatory Visit | Attending: Internal Medicine | Admitting: Internal Medicine

## 2012-09-02 DIAGNOSIS — M810 Age-related osteoporosis without current pathological fracture: Secondary | ICD-10-CM

## 2012-09-03 NOTE — Telephone Encounter (Signed)
Hopp placed order on 09/01/12

## 2012-09-05 ENCOUNTER — Encounter: Payer: Self-pay | Admitting: Internal Medicine

## 2012-09-08 ENCOUNTER — Encounter: Payer: Self-pay | Admitting: *Deleted

## 2012-09-11 ENCOUNTER — Encounter: Payer: Self-pay | Admitting: Lab

## 2012-09-12 ENCOUNTER — Other Ambulatory Visit (INDEPENDENT_AMBULATORY_CARE_PROVIDER_SITE_OTHER): Payer: Medicare Other

## 2012-09-12 DIAGNOSIS — E1059 Type 1 diabetes mellitus with other circulatory complications: Secondary | ICD-10-CM

## 2012-09-12 DIAGNOSIS — E785 Hyperlipidemia, unspecified: Secondary | ICD-10-CM

## 2012-09-12 DIAGNOSIS — I10 Essential (primary) hypertension: Secondary | ICD-10-CM

## 2012-09-12 LAB — HEPATIC FUNCTION PANEL
ALT: 17 U/L (ref 0–35)
Total Bilirubin: 0.6 mg/dL (ref 0.3–1.2)

## 2012-09-12 LAB — TSH: TSH: 2.66 u[IU]/mL (ref 0.35–5.50)

## 2012-09-12 LAB — BASIC METABOLIC PANEL
BUN: 23 mg/dL (ref 6–23)
CO2: 28 mEq/L (ref 19–32)
GFR: 82.81 mL/min (ref >60.00)
Glucose, Bld: 168 mg/dL — ABNORMAL HIGH (ref 70–99)
Potassium: 4.1 mEq/L (ref 3.5–5.1)
Sodium: 137 mEq/L (ref 135–145)

## 2012-09-12 LAB — LIPID PANEL
HDL: 38.8 mg/dL — ABNORMAL LOW (ref >39.00)
LDL Cholesterol: 62 mg/dL (ref 0–99)
Total CHOL/HDL Ratio: 3
VLDL: 27.6 mg/dL (ref 0.0–40.0)

## 2012-09-12 LAB — HEMOGLOBIN A1C: Hgb A1c MFr Bld: 7 % — ABNORMAL HIGH (ref 4.6–6.5)

## 2012-09-16 DIAGNOSIS — M6530 Trigger finger, unspecified finger: Secondary | ICD-10-CM | POA: Diagnosis not present

## 2012-09-17 ENCOUNTER — Encounter: Payer: Self-pay | Admitting: Infectious Disease

## 2012-09-17 ENCOUNTER — Ambulatory Visit (INDEPENDENT_AMBULATORY_CARE_PROVIDER_SITE_OTHER): Payer: Medicare Other | Admitting: Infectious Disease

## 2012-09-17 VITALS — BP 159/79 | HR 61 | Temp 97.6°F | Ht 67.0 in | Wt 192.0 lb

## 2012-09-17 DIAGNOSIS — H25019 Cortical age-related cataract, unspecified eye: Secondary | ICD-10-CM | POA: Diagnosis not present

## 2012-09-17 DIAGNOSIS — N390 Urinary tract infection, site not specified: Secondary | ICD-10-CM

## 2012-09-17 DIAGNOSIS — B9620 Unspecified Escherichia coli [E. coli] as the cause of diseases classified elsewhere: Secondary | ICD-10-CM

## 2012-09-17 DIAGNOSIS — E119 Type 2 diabetes mellitus without complications: Secondary | ICD-10-CM | POA: Diagnosis not present

## 2012-09-17 DIAGNOSIS — A498 Other bacterial infections of unspecified site: Secondary | ICD-10-CM

## 2012-09-17 DIAGNOSIS — H2510 Age-related nuclear cataract, unspecified eye: Secondary | ICD-10-CM | POA: Diagnosis not present

## 2012-09-17 DIAGNOSIS — H04129 Dry eye syndrome of unspecified lacrimal gland: Secondary | ICD-10-CM | POA: Diagnosis not present

## 2012-09-17 NOTE — Patient Instructions (Addendum)
If you have ssx concerning for a UTI then probably the best empiric choice is an antibiotic known as fosfomycin   The main goal however is to AVOID UNNECESSARY ANTIBIOTICS TO GIVE CHANCE THAT your bladder may  Have return of bacteria that are NOT resistant to antibiotics

## 2012-09-17 NOTE — Progress Notes (Signed)
Subjective:    Patient ID: [name], female    DOB: 1941/04/08, 72 y.o.   MRN: [id number]  Urinary Tract Infection  Pertinent negatives include no chills, flank pain, hematuria, nausea or vomiting.    72 year old with CM, CVA, HTN, who used to suffer from recurrent UTIs and in fact ultimately underwent biopsy by Urology for cause of cystitis with biopsy that per pt was unrevealing. She claims she did not have any UTI since that biopsy in Feb of 2013 and this January when she had severe dysuria. She had positive UA and urine culture with MDR E coli   Culture ESCHERICHIA COLI    Colony Count >=100,000 COLONIES/ML    Organism ID, Bacteria ESCHERICHIA COLI    Resulting Agency [facility]       Antibiotic  Organism Organism Organism     ESCHERICHIA COLI     AMPICILLIN  >=32 R Final       AMPICILLIN/SULBACTAM  >=32 R Final       CEFAZOLIN  >=64 R Final       CEFEPIME  <=1 S Final       CEFOXITIN  >=64 R Final       CEFTAZIDIME  16 I Final       CEFTRIAXONE  2 S Final       CIPROFLOXACIN  >=4 R Final       GENTAMICIN  >=16 R Final       IMIPENEM  <=0.25 S Final       LEVOFLOXACIN  >=8 R Final       NITROFURANTOIN  64 I Final       PIP/TAZO  64 I Final       TOBRAMYCIN  8 I Final       TRIMETH/SULFA  >=320 R Final                  She was given macrobid which despite E coli only being I to it was sufficient to completely resolve her symptoms. She was referred to [place] by her PCP to help manage recurrent UTIs.   I have explained to her that my main advice is to AVOID unnecssary antibiotics and to be judicious with use of them only when needed. I gave her some sterile containers in case she has ssx on weekend and cannot come to a clinic to have specimen collected. Certainly should she have another infection with similar profile to one above and one wanted to use an oral abx, fosfomycin would be a reasonable choice.    Review of Systems  Constitutional: Negative for fever, chills,  diaphoresis, activity change, appetite change, fatigue and unexpected weight change.  HENT: Negative for congestion, sore throat, rhinorrhea, sneezing, trouble swallowing and sinus pressure.   Eyes: Negative for photophobia and visual disturbance.  Respiratory: Negative for cough, chest tightness, shortness of breath, wheezing and stridor.   Cardiovascular: Negative for chest pain, palpitations and leg swelling.  Gastrointestinal: Negative for nausea, vomiting, abdominal pain, diarrhea, constipation, blood in stool, abdominal distention and anal bleeding.  Genitourinary: Negative for dysuria, hematuria, flank pain and difficulty urinating.  Musculoskeletal: Negative for myalgias, back pain, joint swelling, arthralgias and gait problem.  Skin: Negative for color change, pallor, rash and wound.  Neurological: Negative for dizziness, tremors, weakness and light-headedness.  Hematological: Negative for adenopathy. Does not bruise/bleed easily.  Psychiatric/Behavioral: Negative for behavioral problems, confusion, sleep disturbance, dysphoric mood, decreased concentration and agitation.  Objective:   Physical Exam  Constitutional: She is oriented to person, place, and time. She appears well-developed and well-nourished. No distress.  HENT:  Head: Normocephalic and atraumatic.  Mouth/Throat: Oropharynx is clear and moist. No oropharyngeal exudate.  Eyes: Conjunctivae and EOM are normal. Pupils are equal, round, and reactive to light. No scleral icterus.  Neck: Normal range of motion. Neck supple. No JVD present.  Cardiovascular: Normal rate, regular rhythm and normal heart sounds.  Exam reveals no gallop and no friction rub.   No murmur heard. Pulmonary/Chest: Effort normal and breath sounds normal. No respiratory distress. She has no wheezes. She has no rales. She exhibits no tenderness.  Abdominal: She exhibits no distension and no mass. There is no tenderness. There is no rebound and no  guarding.  Musculoskeletal: She exhibits no edema and no tenderness.  Lymphadenopathy:    She has no cervical adenopathy.  Neurological: She is alert and oriented to person, place, and time. She exhibits normal muscle tone. Coordination normal.  Skin: Skin is warm and dry. She is not diaphoretic. No erythema. No pallor.  Psychiatric: She has a normal mood and affect. Her behavior is normal. Judgment and thought content normal.          Assessment & Plan:  Recurrent UTI's: apparently workup by Urology has failed to disclose a structural cause of these. With her having gone a year between her last 2 infections though I am encouraged that she has had improvement. Her E coli recently isolated was fairly resistant. Key to not breeding more resistance is to ONLY treat her when she has obvious ssx. I have cautioned her against having her urine cultured in absence of ssx and asked for ex that when she sees Urology that they NOT culture her urine UNLESS she actually has ssx at time of appt. If she does have sss of UTI I would like her to let us know so that she can have Urine collected for analysis and culture and we can tailor abx accordingly. She wishes to only followup PRN  MDR E coli; see above plan

## 2012-09-18 ENCOUNTER — Encounter: Payer: Self-pay | Admitting: Internal Medicine

## 2012-09-25 DIAGNOSIS — N390 Urinary tract infection, site not specified: Secondary | ICD-10-CM | POA: Diagnosis not present

## 2012-09-25 DIAGNOSIS — N393 Stress incontinence (female) (male): Secondary | ICD-10-CM | POA: Diagnosis not present

## 2012-09-25 DIAGNOSIS — K8020 Calculus of gallbladder without cholecystitis without obstruction: Secondary | ICD-10-CM | POA: Diagnosis not present

## 2012-10-01 ENCOUNTER — Encounter: Payer: Self-pay | Admitting: Internal Medicine

## 2012-10-01 DIAGNOSIS — H52229 Regular astigmatism, unspecified eye: Secondary | ICD-10-CM | POA: Diagnosis not present

## 2012-10-01 DIAGNOSIS — H2510 Age-related nuclear cataract, unspecified eye: Secondary | ICD-10-CM | POA: Diagnosis not present

## 2012-10-01 DIAGNOSIS — H04129 Dry eye syndrome of unspecified lacrimal gland: Secondary | ICD-10-CM | POA: Diagnosis not present

## 2012-10-01 DIAGNOSIS — H5200 Hypermetropia, unspecified eye: Secondary | ICD-10-CM | POA: Diagnosis not present

## 2012-10-07 ENCOUNTER — Other Ambulatory Visit (HOSPITAL_COMMUNITY): Payer: Self-pay | Admitting: Urology

## 2012-10-07 DIAGNOSIS — N321 Vesicointestinal fistula: Secondary | ICD-10-CM

## 2012-10-08 ENCOUNTER — Encounter: Payer: Self-pay | Admitting: Lab

## 2012-10-10 ENCOUNTER — Ambulatory Visit (HOSPITAL_COMMUNITY)
Admission: RE | Admit: 2012-10-10 | Discharge: 2012-10-10 | Disposition: A | Discharge status: Home / Self Care | Payer: Medicare Other | Source: Ambulatory Visit | Attending: Urology | Admitting: Urology

## 2012-10-10 DIAGNOSIS — E119 Type 2 diabetes mellitus without complications: Secondary | ICD-10-CM | POA: Diagnosis not present

## 2012-10-10 DIAGNOSIS — K8020 Calculus of gallbladder without cholecystitis without obstruction: Secondary | ICD-10-CM | POA: Diagnosis not present

## 2012-10-10 DIAGNOSIS — Z79899 Other long term (current) drug therapy: Secondary | ICD-10-CM | POA: Diagnosis not present

## 2012-10-10 DIAGNOSIS — N321 Vesicointestinal fistula: Secondary | ICD-10-CM | POA: Insufficient documentation

## 2012-10-10 DIAGNOSIS — M47817 Spondylosis without myelopathy or radiculopathy, lumbosacral region: Secondary | ICD-10-CM | POA: Diagnosis not present

## 2012-10-10 DIAGNOSIS — N3289 Other specified disorders of bladder: Secondary | ICD-10-CM | POA: Diagnosis not present

## 2012-10-10 DIAGNOSIS — K632 Fistula of intestine: Secondary | ICD-10-CM | POA: Diagnosis not present

## 2012-10-10 DIAGNOSIS — K449 Diaphragmatic hernia without obstruction or gangrene: Secondary | ICD-10-CM | POA: Insufficient documentation

## 2012-10-10 MED ORDER — IOHEXOL 300 MG/ML  SOLN
100.0000 mL | Freq: Once | INTRAMUSCULAR | Status: AC | PRN
  Administered 2012-10-10: 100 mL via INTRAVENOUS

## 2012-10-15 ENCOUNTER — Encounter: Payer: Self-pay | Admitting: Internal Medicine

## 2012-10-20 DIAGNOSIS — M654 Radial styloid tenosynovitis [de Quervain]: Secondary | ICD-10-CM | POA: Diagnosis not present

## 2012-10-22 DIAGNOSIS — H04129 Dry eye syndrome of unspecified lacrimal gland: Secondary | ICD-10-CM | POA: Diagnosis not present

## 2012-10-23 DIAGNOSIS — N321 Vesicointestinal fistula: Secondary | ICD-10-CM | POA: Diagnosis not present

## 2012-11-06 DIAGNOSIS — I10 Essential (primary) hypertension: Secondary | ICD-10-CM | POA: Diagnosis not present

## 2012-11-06 DIAGNOSIS — E119 Type 2 diabetes mellitus without complications: Secondary | ICD-10-CM | POA: Diagnosis not present

## 2012-11-06 DIAGNOSIS — N321 Vesicointestinal fistula: Secondary | ICD-10-CM | POA: Diagnosis not present

## 2012-11-12 ENCOUNTER — Other Ambulatory Visit: Payer: Self-pay | Admitting: Internal Medicine

## 2012-11-13 DIAGNOSIS — M654 Radial styloid tenosynovitis [de Quervain]: Secondary | ICD-10-CM | POA: Diagnosis not present

## 2012-11-21 DIAGNOSIS — N390 Urinary tract infection, site not specified: Secondary | ICD-10-CM | POA: Diagnosis not present

## 2012-11-21 DIAGNOSIS — N321 Vesicointestinal fistula: Secondary | ICD-10-CM | POA: Diagnosis not present

## 2012-11-21 DIAGNOSIS — N3946 Mixed incontinence: Secondary | ICD-10-CM | POA: Diagnosis not present

## 2012-11-24 DIAGNOSIS — M9981 Other biomechanical lesions of cervical region: Secondary | ICD-10-CM | POA: Diagnosis not present

## 2012-11-24 DIAGNOSIS — M62838 Other muscle spasm: Secondary | ICD-10-CM | POA: Diagnosis not present

## 2012-11-24 DIAGNOSIS — IMO0002 Reserved for concepts with insufficient information to code with codable children: Secondary | ICD-10-CM | POA: Diagnosis not present

## 2012-11-24 DIAGNOSIS — M999 Biomechanical lesion, unspecified: Secondary | ICD-10-CM | POA: Diagnosis not present

## 2012-11-25 ENCOUNTER — Other Ambulatory Visit: Payer: Self-pay | Admitting: Neurology

## 2012-11-25 DIAGNOSIS — I6522 Occlusion and stenosis of left carotid artery: Secondary | ICD-10-CM

## 2012-11-27 DIAGNOSIS — M999 Biomechanical lesion, unspecified: Secondary | ICD-10-CM | POA: Diagnosis not present

## 2012-11-27 DIAGNOSIS — IMO0002 Reserved for concepts with insufficient information to code with codable children: Secondary | ICD-10-CM | POA: Diagnosis not present

## 2012-11-27 DIAGNOSIS — M9981 Other biomechanical lesions of cervical region: Secondary | ICD-10-CM | POA: Diagnosis not present

## 2012-11-27 DIAGNOSIS — M62838 Other muscle spasm: Secondary | ICD-10-CM | POA: Diagnosis not present

## 2012-11-28 DIAGNOSIS — N321 Vesicointestinal fistula: Secondary | ICD-10-CM | POA: Diagnosis not present

## 2012-11-28 DIAGNOSIS — E119 Type 2 diabetes mellitus without complications: Secondary | ICD-10-CM | POA: Diagnosis not present

## 2012-11-28 DIAGNOSIS — I10 Essential (primary) hypertension: Secondary | ICD-10-CM | POA: Diagnosis not present

## 2012-12-02 ENCOUNTER — Ambulatory Visit (INDEPENDENT_AMBULATORY_CARE_PROVIDER_SITE_OTHER): Payer: Medicare Other

## 2012-12-02 DIAGNOSIS — I6529 Occlusion and stenosis of unspecified carotid artery: Secondary | ICD-10-CM

## 2012-12-02 DIAGNOSIS — I6522 Occlusion and stenosis of left carotid artery: Secondary | ICD-10-CM

## 2012-12-08 ENCOUNTER — Telehealth: Payer: Self-pay | Admitting: Neurology

## 2012-12-08 NOTE — Telephone Encounter (Signed)
I called the patient. The carotid doppler study is unchanged from last year. 50 to 69% stenosis of the proximal right and mid left ICA. Re check in one year.

## 2012-12-16 DIAGNOSIS — M9981 Other biomechanical lesions of cervical region: Secondary | ICD-10-CM | POA: Diagnosis not present

## 2012-12-16 DIAGNOSIS — IMO0002 Reserved for concepts with insufficient information to code with codable children: Secondary | ICD-10-CM | POA: Diagnosis not present

## 2012-12-16 DIAGNOSIS — M62838 Other muscle spasm: Secondary | ICD-10-CM | POA: Diagnosis not present

## 2012-12-16 DIAGNOSIS — M999 Biomechanical lesion, unspecified: Secondary | ICD-10-CM | POA: Diagnosis not present

## 2012-12-24 ENCOUNTER — Encounter: Payer: Self-pay | Admitting: Internal Medicine

## 2012-12-24 ENCOUNTER — Ambulatory Visit (INDEPENDENT_AMBULATORY_CARE_PROVIDER_SITE_OTHER): Payer: Medicare Other | Admitting: Internal Medicine

## 2012-12-24 VITALS — BP 142/80 | HR 70 | Wt 188.8 lb

## 2012-12-24 DIAGNOSIS — R1032 Left lower quadrant pain: Secondary | ICD-10-CM | POA: Diagnosis not present

## 2012-12-24 DIAGNOSIS — M999 Biomechanical lesion, unspecified: Secondary | ICD-10-CM | POA: Diagnosis not present

## 2012-12-24 DIAGNOSIS — E119 Type 2 diabetes mellitus without complications: Secondary | ICD-10-CM

## 2012-12-24 DIAGNOSIS — R1031 Right lower quadrant pain: Secondary | ICD-10-CM

## 2012-12-24 DIAGNOSIS — IMO0002 Reserved for concepts with insufficient information to code with codable children: Secondary | ICD-10-CM | POA: Diagnosis not present

## 2012-12-24 DIAGNOSIS — M62838 Other muscle spasm: Secondary | ICD-10-CM | POA: Diagnosis not present

## 2012-12-24 DIAGNOSIS — M9981 Other biomechanical lesions of cervical region: Secondary | ICD-10-CM | POA: Diagnosis not present

## 2012-12-24 DIAGNOSIS — I10 Essential (primary) hypertension: Secondary | ICD-10-CM | POA: Diagnosis not present

## 2012-12-24 DIAGNOSIS — N390 Urinary tract infection, site not specified: Secondary | ICD-10-CM

## 2012-12-24 LAB — CBC WITH DIFFERENTIAL/PLATELET
Basophils Relative: 0.4 % (ref 0.0–3.0)
Eosinophils Absolute: 0.2 10*3/uL (ref 0.0–0.7)
HCT: 38.5 % (ref 36.0–46.0)
Hemoglobin: 13.1 g/dL (ref 12.0–15.0)
Lymphs Abs: 1.9 10*3/uL (ref 0.7–4.0)
MCHC: 34.1 g/dL (ref 30.0–36.0)
MCV: 90.9 fl (ref 78.0–100.0)
Monocytes Absolute: 0.6 10*3/uL (ref 0.1–1.0)
Neutro Abs: 5.5 10*3/uL (ref 1.4–7.7)
RBC: 4.24 Mil/uL (ref 3.87–5.11)

## 2012-12-24 LAB — BASIC METABOLIC PANEL
BUN: 15 mg/dL (ref 6–23)
GFR: 110.09 mL/min (ref >60.00)
Glucose, Bld: 178 mg/dL — ABNORMAL HIGH (ref 70–99)
Potassium: 4.4 mEq/L (ref 3.5–5.1)

## 2012-12-24 LAB — POCT URINALYSIS DIPSTICK
Ketones, UA: NEGATIVE
Protein, UA: NEGATIVE
Spec Grav, UA: >=1.03
pH, UA: 5

## 2012-12-24 MED ORDER — NITROFURANTOIN MONOHYD MACRO 100 MG PO CAPS: 100.0000 mg | ORAL_CAPSULE | Freq: Two times a day (BID) | ORAL | Status: DC

## 2012-12-24 MED ORDER — METFORMIN HCL 1000 MG PO TABS: ORAL_TABLET | ORAL | Status: DC

## 2012-12-24 MED ORDER — CLONAZEPAM 0.5 MG PO TABS: 0.5000 mg | ORAL_TABLET | Freq: Every day | ORAL | Status: DC

## 2012-12-24 NOTE — Patient Instructions (Addendum)
Minimal Blood Pressure Goal= AVERAGE < 140/90;  Ideal is an AVERAGE < 135/85. This AVERAGE should be calculated from @ least 5-7 BP readings taken @ different times of day on different days of week. You should not respond to isolated BP readings , but rather the AVERAGE for that week .Please bring your  blood pressure cuff to office visits to verify that it is reliable.It  can also be checked against the blood pressure device at the pharmacy. Finger or wrist cuffs are not dependable; an arm cuff is.  If you activate the  My Chart system; lab & Xray results will be released directly  to you as soon as I review & address these through the computer. If you choose not to sign up for My Chart within 36 hours of labs being drawn; results will be reviewed & interpretation added before being copied & mailed, causing a delay in getting the results to you.If you do not receive that report within 7-10 days ,please call. Additionally you can use this system to gain direct  access to your records  if  out of town or @ an office of a  physician who is not in  the My Chart network.  This improves continuity of care & places you in control of your medical record.  Share results with all non [facility] medical staff seen  

## 2012-12-24 NOTE — Progress Notes (Signed)
  Subjective:    Patient ID: [name], female    DOB: 06/03/1941, 72 y.o.   MRN: [id number]  HPI  She developed acute diffuse lower abdominal discomfort last night without specific trigger. It was described as dull to a level 3-4.  The pain was associated with radiation to the back with again a dull level four discomfort. NSAIDS helped pain in both areas.  Significantly she is being evaluated for a possible fistula between the colon and the bladder. She did a "poppyseed" test; some of poppyseeds were actually found in her urine. She is seeing Dr. Byrd Hesselbach at [facility] who has recommended resective colon surgery. She has a colonoscopy scheduled next week.  Originally the appointment was to refill her metformin and Klonopin.  She is not monitoring her blood pressure at home.      Review of Systems  Nausea vomiting, constipation , diarrhea, melena or rectal bleeding were not described with pain but she has had intermittent loose stool in recent past.  There was no associated dyspepsia ,dysphagia, anorexia or hematemesis. No significant weight change noted. Fever,chills,  sweats, and weight loss were not present. Dysuria, pyuria, and hematuria were absent. Urine cultures have revealed resistant Escherichia coli. There was no associated rash or radicular pain in the area of the discomfort.  Past medical history and family history are negative   for significant gastrointestinal diseases such as ulcer, reflux,  colitis, or  colon polyps. Her mother had colon cancer.  She denies chest pain, palpitations, exertional dyspnea, or claudication.  Fasting blood sugars have been in the 180s; two-hour postprandial glucoses been less than 190. She's had some polyuria and blurred vision. The latter may relate to cataracts.  She denies polydipsia or polyphagia. She has no  diplopia or vision loss. She also denies any nonhealing skin lesions or numbness, tingling, burning in the extremities except  post CVA tingling R hand              Objective:   Physical Exam General appearance is one of good health and nourishment w/o distress. Eyes: No conjunctival inflammation or scleral icterus is present.Anisocoria , OS > OD Oral exam: Dental hygiene is good; lips and gums are healthy appearing.There is no oropharyngeal erythema or exudate noted.  Heart:  Normal rate and regular rhythm. S1 and S2 normal without gallop, murmur, click, or rub.S4 with slight slurring . Lungs:Chest clear to auscultation; no wheezes, rhonchi,rales ,or rubs present.No increased work of breathing.  Abdomen: bowel sounds normal, soft but tender suprapubically without masses, organomegaly or hernias noted.  No guarding or rebound. Skin:Warm & dry.  Intact without suspicious lesions or rashes ; no jaundice or tenting. Lymphatic: No lymphadenopathy is noted about the head, neck, or axilla.         Assessment & Plan:  #1 abdominal pain; ? Relationship to bladder / colon fistula #2 HTN #3 DM , ? Control See Orders

## 2012-12-26 DIAGNOSIS — Z79899 Other long term (current) drug therapy: Secondary | ICD-10-CM | POA: Diagnosis not present

## 2012-12-30 DIAGNOSIS — Q898 Other specified congenital malformations: Secondary | ICD-10-CM | POA: Diagnosis not present

## 2012-12-30 DIAGNOSIS — N321 Vesicointestinal fistula: Secondary | ICD-10-CM | POA: Diagnosis not present

## 2012-12-30 DIAGNOSIS — R7309 Other abnormal glucose: Secondary | ICD-10-CM | POA: Diagnosis not present

## 2012-12-30 DIAGNOSIS — Z1211 Encounter for screening for malignant neoplasm of colon: Secondary | ICD-10-CM | POA: Diagnosis not present

## 2013-01-02 ENCOUNTER — Encounter: Payer: Self-pay | Admitting: Internal Medicine

## 2013-01-08 DIAGNOSIS — E119 Type 2 diabetes mellitus without complications: Secondary | ICD-10-CM | POA: Diagnosis not present

## 2013-01-08 DIAGNOSIS — N321 Vesicointestinal fistula: Secondary | ICD-10-CM | POA: Diagnosis not present

## 2013-01-28 DIAGNOSIS — Z01818 Encounter for other preprocedural examination: Secondary | ICD-10-CM | POA: Diagnosis not present

## 2013-01-28 DIAGNOSIS — R9431 Abnormal electrocardiogram [ECG] [EKG]: Secondary | ICD-10-CM | POA: Diagnosis not present

## 2013-02-04 DIAGNOSIS — Z96659 Presence of unspecified artificial knee joint: Secondary | ICD-10-CM | POA: Diagnosis not present

## 2013-02-04 DIAGNOSIS — M129 Arthropathy, unspecified: Secondary | ICD-10-CM | POA: Diagnosis present

## 2013-02-04 DIAGNOSIS — I69998 Other sequelae following unspecified cerebrovascular disease: Secondary | ICD-10-CM | POA: Diagnosis not present

## 2013-02-04 DIAGNOSIS — I10 Essential (primary) hypertension: Secondary | ICD-10-CM | POA: Diagnosis present

## 2013-02-04 DIAGNOSIS — G8918 Other acute postprocedural pain: Secondary | ICD-10-CM | POA: Diagnosis not present

## 2013-02-04 DIAGNOSIS — E119 Type 2 diabetes mellitus without complications: Secondary | ICD-10-CM | POA: Diagnosis not present

## 2013-02-04 DIAGNOSIS — K5732 Diverticulitis of large intestine without perforation or abscess without bleeding: Secondary | ICD-10-CM | POA: Diagnosis not present

## 2013-02-04 DIAGNOSIS — N3946 Mixed incontinence: Secondary | ICD-10-CM | POA: Diagnosis present

## 2013-02-04 DIAGNOSIS — F3289 Other specified depressive episodes: Secondary | ICD-10-CM | POA: Diagnosis present

## 2013-02-04 DIAGNOSIS — G4700 Insomnia, unspecified: Secondary | ICD-10-CM | POA: Diagnosis present

## 2013-02-04 DIAGNOSIS — K5730 Diverticulosis of large intestine without perforation or abscess without bleeding: Secondary | ICD-10-CM | POA: Diagnosis not present

## 2013-02-04 DIAGNOSIS — Z86718 Personal history of other venous thrombosis and embolism: Secondary | ICD-10-CM | POA: Diagnosis not present

## 2013-02-04 DIAGNOSIS — R29898 Other symptoms and signs involving the musculoskeletal system: Secondary | ICD-10-CM | POA: Diagnosis present

## 2013-02-04 DIAGNOSIS — I6529 Occlusion and stenosis of unspecified carotid artery: Secondary | ICD-10-CM | POA: Diagnosis present

## 2013-02-04 DIAGNOSIS — F329 Major depressive disorder, single episode, unspecified: Secondary | ICD-10-CM | POA: Diagnosis present

## 2013-02-04 DIAGNOSIS — F411 Generalized anxiety disorder: Secondary | ICD-10-CM | POA: Diagnosis present

## 2013-02-04 DIAGNOSIS — N321 Vesicointestinal fistula: Secondary | ICD-10-CM | POA: Diagnosis not present

## 2013-02-04 DIAGNOSIS — Z87440 Personal history of urinary (tract) infections: Secondary | ICD-10-CM | POA: Diagnosis not present

## 2013-02-13 DIAGNOSIS — R109 Unspecified abdominal pain: Secondary | ICD-10-CM | POA: Diagnosis not present

## 2013-02-13 DIAGNOSIS — N321 Vesicointestinal fistula: Secondary | ICD-10-CM | POA: Diagnosis not present

## 2013-02-16 DIAGNOSIS — N321 Vesicointestinal fistula: Secondary | ICD-10-CM | POA: Diagnosis not present

## 2013-03-08 ENCOUNTER — Other Ambulatory Visit: Payer: Self-pay | Admitting: Internal Medicine

## 2013-03-10 NOTE — Telephone Encounter (Signed)
A1C/malb 250.00

## 2013-03-12 DIAGNOSIS — N321 Vesicointestinal fistula: Secondary | ICD-10-CM | POA: Diagnosis not present

## 2013-05-06 DIAGNOSIS — H04129 Dry eye syndrome of unspecified lacrimal gland: Secondary | ICD-10-CM | POA: Diagnosis not present

## 2013-05-06 DIAGNOSIS — H35379 Puckering of macula, unspecified eye: Secondary | ICD-10-CM | POA: Diagnosis not present

## 2013-05-23 ENCOUNTER — Other Ambulatory Visit: Payer: Self-pay | Admitting: Internal Medicine

## 2013-05-25 NOTE — Telephone Encounter (Signed)
Metformin refill sent to pharmacy

## 2013-05-26 DIAGNOSIS — N393 Stress incontinence (female) (male): Secondary | ICD-10-CM | POA: Diagnosis not present

## 2013-05-26 DIAGNOSIS — N3941 Urge incontinence: Secondary | ICD-10-CM | POA: Diagnosis not present

## 2013-05-26 DIAGNOSIS — R3915 Urgency of urination: Secondary | ICD-10-CM | POA: Diagnosis not present

## 2013-05-26 DIAGNOSIS — N321 Vesicointestinal fistula: Secondary | ICD-10-CM | POA: Diagnosis not present

## 2013-05-28 ENCOUNTER — Other Ambulatory Visit: Payer: Self-pay

## 2013-06-08 ENCOUNTER — Encounter: Payer: Self-pay | Admitting: Internal Medicine

## 2013-06-08 ENCOUNTER — Ambulatory Visit (INDEPENDENT_AMBULATORY_CARE_PROVIDER_SITE_OTHER): Payer: Medicare Other | Admitting: Internal Medicine

## 2013-06-08 VITALS — BP 125/74 | HR 70 | Temp 97.9°F | Ht 65.08 in | Wt 185.2 lb

## 2013-06-08 DIAGNOSIS — IMO0001 Reserved for inherently not codable concepts without codable children: Secondary | ICD-10-CM | POA: Diagnosis not present

## 2013-06-08 DIAGNOSIS — E782 Mixed hyperlipidemia: Secondary | ICD-10-CM | POA: Diagnosis not present

## 2013-06-08 DIAGNOSIS — I10 Essential (primary) hypertension: Secondary | ICD-10-CM | POA: Diagnosis not present

## 2013-06-08 MED ORDER — CLONAZEPAM 0.5 MG PO TABS: 0.5000 mg | ORAL_TABLET | Freq: Every day | ORAL | Status: DC

## 2013-06-08 MED ORDER — CITALOPRAM HYDROBROMIDE 20 MG PO TABS: ORAL_TABLET | ORAL | Status: DC

## 2013-06-08 NOTE — Progress Notes (Signed)
Subjective:    Patient ID: [name], female    DOB: 23-May-1941, 72 y.o.   MRN: [id number]  HPI The patient is here for followup of diabetes, hyperlipidemia ,and hypertension.  The most recent A1c 12/24/12 was  7.2% , which correlates to an average sugar of 180  , and long-term risk of 44% . Fasting blood sugar range 140-160  .  Highest two-hour postprandial glucose is < 180 usually . No hypoglycemia reported Ophthalmologic examination current for cataracts & dry eye;exam revealed no retinopathy. No active podiatry assessment on record. Diet is modified  low carb ,sugar restricted . Exercise as water aerobics 3 X/ week  for 45 minutes.  The most recent lipids 2/14   revealed LDL 62 ; HDL 38.9 ; and triglycerides 138   . There is medical compliance with the statin.  Blood pressure  average  120/60 . There is medical  compliance with antihypertensive medications  No  medication adverse effects suggested         Review of Systems Constitutional: Some weight gain post op. No excess fatigue Eyes: No  blurred vision;  double vision ; loss of vision Cardiovascular: no chest pain ;palpitations; racing; irregular rhythm ;syncope; claudication ; edema  Respiratory: No exertional dyspnea;  paroxysmal nocturnal dyspnea Genitourinary: No dysuria; hematuria ; pyuria; frequency;  Incontinence;  nocturia Musculoskeletal: No myalgias or muscle cramping but arthralgias , especially in hands Dermatologic: No non healing  Lesions; change in color or temperature of skin Neurologic: No  limb weakness;  numbness or tingling;  burning Endocrine: No change in hair, skin, nails. No excessive thirst; excessive hunger;  excessive urination      Objective:   Physical Exam  Gen.: Healthy and well-nourished in appearance. Alert, appropriate and cooperative throughout exam.Appears younger than stated age  Head: Normocephalic without obvious abnormalities  Eyes: No corneal or conjunctival inflammation  noted. Pupils equal round reactive to light and accommodation.  Nose: External nasal exam reveals no deformity or inflammation. Nasal mucosa are pink and moist. No lesions or exudates noted.  Mouth: Oral mucosa and oropharynx reveal no lesions or exudates. Teeth in good repair. Neck: No deformities, masses, or tenderness noted.  Thyroid normal. Lungs: Normal respiratory effort; chest expands symmetrically. Lungs are clear to auscultation without rales, wheezes, or increased work of breathing. Heart: Normal rate and rhythm. Normal S1 and S2. No gallop, click, or rub. S4 w/o murmur. Abdomen: Bowel sounds normal; abdomen soft and nontender. No masses, organomegaly or hernias noted. Genitalia:  as per Gyn                                  Musculoskeletal/extremities: No deformity or scoliosis noted of  the thoracic or lumbar spine.  No clubbing, cyanosis, edema, or significant extremity  deformity noted. Range of motion decreased @ knees .Tone & strength normal. Hand joints  reveal mild  DJD DIP changes. Fingernail / toenail health good. Able to lie down & sit up w/o help. Negative SLR bilaterally Vascular: Carotid, radial artery, dorsalis pedis and  posterior tibial pulses are full and equal. No bruits present. Neurologic: Alert and oriented x3. Deep tendon reflexes symmetrical and normal.         Skin: Intact without suspicious lesions or rashes. Lymph: No cervical, axillary lymphadenopathy present. Psych: Mood and affect are normal. Normally interactive  Assessment & Plan:  See Current Assessment & Plan in Problem List under specific Diagnosis

## 2013-06-08 NOTE — Progress Notes (Signed)
Pre visit review using our clinic review tool, if applicable. No additional management support is needed unless otherwise documented below in the visit note. 

## 2013-06-08 NOTE — Assessment & Plan Note (Signed)
Lipids , LFTs due in 4 months; LDL was@ goal

## 2013-06-08 NOTE — Assessment & Plan Note (Signed)
BMET,A1c & urine microalbumin  

## 2013-06-08 NOTE — Assessment & Plan Note (Signed)
BP controlled BMET 

## 2013-06-08 NOTE — Patient Instructions (Signed)
Your next office appointment will be determined based upon review of your pending labs. Those instructions will be transmitted to you through My Chart . 

## 2013-06-09 ENCOUNTER — Other Ambulatory Visit: Payer: Self-pay | Admitting: Internal Medicine

## 2013-06-09 DIAGNOSIS — Z79899 Other long term (current) drug therapy: Secondary | ICD-10-CM | POA: Diagnosis not present

## 2013-06-09 DIAGNOSIS — Z1231 Encounter for screening mammogram for malignant neoplasm of breast: Secondary | ICD-10-CM | POA: Diagnosis not present

## 2013-06-09 DIAGNOSIS — IMO0001 Reserved for inherently not codable concepts without codable children: Secondary | ICD-10-CM

## 2013-06-09 LAB — BASIC METABOLIC PANEL
BUN: 15 mg/dL (ref 6–23)
GFR: 97.96 mL/min (ref >60.00)
Glucose, Bld: 125 mg/dL — ABNORMAL HIGH (ref 70–99)
Potassium: 4.1 mEq/L (ref 3.5–5.1)

## 2013-06-17 DIAGNOSIS — H04129 Dry eye syndrome of unspecified lacrimal gland: Secondary | ICD-10-CM | POA: Diagnosis not present

## 2013-06-17 DIAGNOSIS — H2510 Age-related nuclear cataract, unspecified eye: Secondary | ICD-10-CM | POA: Diagnosis not present

## 2013-06-21 ENCOUNTER — Ambulatory Visit (INDEPENDENT_AMBULATORY_CARE_PROVIDER_SITE_OTHER): Payer: Medicare Other | Admitting: Internal Medicine

## 2013-06-21 VITALS — BP 122/60 | HR 83 | Temp 98.2°F | Resp 18 | Ht 67.0 in | Wt 185.0 lb

## 2013-06-21 DIAGNOSIS — N390 Urinary tract infection, site not specified: Secondary | ICD-10-CM

## 2013-06-21 DIAGNOSIS — R300 Dysuria: Secondary | ICD-10-CM | POA: Diagnosis not present

## 2013-06-21 LAB — POCT UA - MICROSCOPIC ONLY: Yeast, UA: NEGATIVE

## 2013-06-21 LAB — POCT URINALYSIS DIPSTICK
Bilirubin, UA: NEGATIVE
Ketones, UA: NEGATIVE
pH, UA: 7

## 2013-06-21 MED ORDER — CIPROFLOXACIN HCL 250 MG PO TABS: 250.0000 mg | ORAL_TABLET | Freq: Two times a day (BID) | ORAL | Status: DC

## 2013-06-21 NOTE — Progress Notes (Signed)
Subjective:    Patient ID: [name], female    DOB: September 23, 1940, 72 y.o.   MRN: [id number]  This chart was scribed for Barbara Sia, MD by Greggory Stallion, Medical Scribe. This patient's care was started at 12:22 PM.  HPI HPI Comments: [name] is a 72 y.o. female who presents to the office complaining of dysuria with associated back pain that started 4-5 days ago. She thinks she has a UTI. Pt states she has a history of UTIs. Denies fever, abdominal pain. She has history of cystoscopy and hysterectomy.  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 C V A/STROKE 05/31/2010  . Type II or unspecified type diabetes mellitus without mention of complication, uncontrolled 01/24/2010  . DYSURIA 01/24/2010  . THYROID NODULE, RIGHT 01/21/2009  . FATIGUE 07/12/2008  . VITAMIN D DEFICIENCY 12/10/2007  . HYPERLIPIDEMIA 06/17/2007  . HYPERTENSION, ESSENTIAL NOS 06/17/2007  . Osteopenia 06/17/2007  . Osteoarth NOS-Unspec 11/11/2006   Past Medical History  Diagnosis Date  . Hyperlipidemia   . Hypertension   . Depression     PMH of  . Osteoporosis   . Thyroid disease     right thyroid nodule-- BX DONE 08-15-2011  . DVT (deep venous thrombosis) 2006    after arthroscopy  . Obesity     Redux therapy  . MRSA (methicillin resistant staph aureus) culture positive     X3; last post TKR  . History of CVA (cerebrovascular accident) NEUROLOGIST  DR WILLIS----  06-01-2010-  LEFT BASAL GANGLIA HEMORRAGE    RESIDUAL RIGHT HAND NUMBNESS/TINGLING  . Numbness and tingling in right hand RESIDUAL FROM CVA NOV 2011  . Diabetes mellitus ORAL MED  . Anxiety   . Asymptomatic carotid artery stenosis LEFT --  MOD. PER DR WILLIS NOTE OCT 2012  . Benign heart murmur   . Nocturia   . Frequency of urination   . Arthritis HANDS, NECK , BACK   Past Surgical History  Procedure Laterality Date  . Carpal tunnel release  2000   RIGHT  . G2 p2    . Knee arthroscopy      BILATERAL PRIOR TO TOTAL KNEE  . Total knee arthroplasty  05-26-2002       LEFT   AND RIGHT  03-30-2098  . Lumbar laminectomy  08-26-2009    L 4 - 5  . Cystoscope      to evaluate recurrent UTIs  . Colonoscopy  1999 & 2005    Dr Juanda Chance  . Abdominal hysterectomy  1973    Endometriosis & fibroid  . Left shoulder surg  1990'S  . Joint replacement  03-28-2006    LEFT THUMB  . Thyroid nodule bx  08-15-2011    RIGHT  . Cystoscopy with biopsy  08/22/2011    Procedure: CYSTOSCOPY WITH BIOPSY;  Surgeon: Milford Cage, MD;  Location: [facility];  Service: Urology;  Laterality: N/A;  . Cystoscopy w/ retrogrades  08/22/2011    Procedure: CYSTOSCOPY WITH RETROGRADE PYELOGRAM;  Surgeon: Milford Cage, MD;  Location: [facility];  Service: Urology;  Laterality: Bilateral;  . Wrist surgery  2014    tendon placed in joint; Dr Mina Marble   Allergies  Allergen Reactions  . Conjugated Estrogens     REACTION: weight gain  & fibrocystic breast disease changes  . Percocet [Oxycodone-Acetaminophen] Itching   Prior to Admission medications   Medication Sig  Start Date End Date Taking? Authorizing Provider  aspirin 81 MG tablet Take 81 mg by mouth daily.   Yes Historical Provider, MD  benazepril (LOTENSIN) 20 MG tablet TAKE 1 TABLET DAILY 11/12/12  Yes Pecola Lawless, MD  calcium citrate-vitamin D 500-400 MG-UNIT chewable tablet Chew 1 tablet by mouth daily.   Yes Historical Provider, MD  Cholecalciferol (VITAMIN D3) 1000 UNITS CAPS Take 1 capsule by mouth daily.   Yes Historical Provider, MD  citalopram (CELEXA) 20 MG tablet TAKE 1 TABLET DAILY 06/08/13  Yes Pecola Lawless, MD  clonazePAM (KLONOPIN) 0.5 MG tablet Take 1 tablet (0.5 mg total) by mouth at bedtime. 06/08/13  Yes Pecola Lawless, MD  glucose blood (FREESTYLE LITE) test strip Check blood sugar daily as directed, DX: 250.00 08/22/12  Yes Pecola Lawless,  MD  metFORMIN (GLUCOPHAGE) 1000 MG tablet Take one-half tablet twice a day with meals. 05/25/13  Yes Pecola Lawless, MD  methylcellulose (ARTIFICIAL TEARS) 1 % ophthalmic solution Place 1 drop into both eyes as needed.   Yes Historical Provider, MD  Multiple Vitamins-Calcium (ONE-A-DAY WOMENS FORMULA) TABS Take 1 tablet by mouth daily.   Yes Historical Provider, MD  pravastatin (PRAVACHOL) 40 MG tablet Take 1 tablet (40 mg total) by mouth at bedtime. 08/22/12  Yes Pecola Lawless, MD  tolterodine (DETROL LA) 4 MG 24 hr capsule Take 4 mg by mouth daily.    Historical Provider, MD   History   Social History  . Marital Status: Widowed    Spouse Name: N/A    Number of Children: N/A  . Years of Education: N/A   Occupational History  . Not on file.   Social History Main Topics  . Smoking status: Never Smoker   . Smokeless tobacco: Never Used  . Alcohol Use: Yes     Comment:  very rarely , < 1 glass wine / month  . Drug Use: No  . Sexual Activity: Not on file   Other Topics Concern  . Not on file   Social History Narrative  . No narrative on file    Review of Systems  Constitutional: Negative for fever.  Gastrointestinal: Negative for abdominal pain.  Genitourinary: Positive for dysuria.  Musculoskeletal: Positive for back pain.       Objective:   Physical Exam  Vitals reviewed. Constitutional: She is oriented to person, place, and time. She appears well-developed and well-nourished. No distress.  HENT:  Head: Normocephalic and atraumatic.  Eyes: EOM are normal.  Neck: Neck supple.  Cardiovascular: Normal rate.   Pulmonary/Chest: Effort normal. No respiratory distress.  Abdominal: There is no tenderness.  Musculoskeletal: Normal range of motion.  No cva tend  Neurological: She is alert and oriented to person, place, and time.  Skin: Skin is warm and dry.  Psychiatric: She has a normal mood and affect. Her behavior is normal.    Filed Vitals:   06/21/13 1141  BP:  122/60  Pulse: 83  Temp: 98.2 F (36.8 C)  TempSrc: Oral  Resp: 18  Height: 5\' 7"  (1.702 m)  Weight: 185 lb (83.915 kg)  SpO2: 96%        Assessment & Plan:  12:25 PM-Discussed treatment plan which includes an antibiotic with pt at bedside and pt agreed to plan.    I have completed the patient encounter in its entirety as documented by the scribe, with editing by me where necessary. Emaline Karnes P. Merla Riches, M.D. Burning with urination - Plan: POCT UA -  Microscopic Only, POCT urinalysis dipstick, Urine culture  Urinary tract infection, site not specified - Plan: Urine culture  Meds ordered this encounter  Medications  . ciprofloxacin (CIPRO) 250 MG tablet    Sig: Take 1 tablet (250 mg total) by mouth 2 (two) times daily.    Dispense:  20 tablet    Refill:  0   Urine cult F/u w/ urol to check for recurrent fistula

## 2013-06-23 LAB — URINE CULTURE

## 2013-07-02 ENCOUNTER — Other Ambulatory Visit: Payer: Self-pay | Admitting: Internal Medicine

## 2013-07-06 ENCOUNTER — Other Ambulatory Visit: Payer: Self-pay | Admitting: Internal Medicine

## 2013-07-07 ENCOUNTER — Other Ambulatory Visit: Payer: Self-pay | Admitting: Internal Medicine

## 2013-07-07 NOTE — Telephone Encounter (Signed)
Med filled.  

## 2013-07-09 ENCOUNTER — Other Ambulatory Visit: Payer: Self-pay | Admitting: Internal Medicine

## 2013-07-18 ENCOUNTER — Other Ambulatory Visit: Payer: Self-pay | Admitting: Internal Medicine

## 2013-07-20 NOTE — Telephone Encounter (Signed)
Benazepril refilled per protocol. JG//CMA 

## 2013-07-22 ENCOUNTER — Ambulatory Visit (INDEPENDENT_AMBULATORY_CARE_PROVIDER_SITE_OTHER): Payer: Medicare Other | Admitting: Emergency Medicine

## 2013-07-22 ENCOUNTER — Ambulatory Visit: Payer: Medicare Other

## 2013-07-22 VITALS — BP 132/68 | HR 82 | Temp 97.7°F | Resp 18 | Ht 66.0 in | Wt 183.0 lb

## 2013-07-22 DIAGNOSIS — R059 Cough, unspecified: Secondary | ICD-10-CM | POA: Diagnosis not present

## 2013-07-22 DIAGNOSIS — E119 Type 2 diabetes mellitus without complications: Secondary | ICD-10-CM | POA: Diagnosis not present

## 2013-07-22 DIAGNOSIS — J101 Influenza due to other identified influenza virus with other respiratory manifestations: Secondary | ICD-10-CM

## 2013-07-22 DIAGNOSIS — R05 Cough: Secondary | ICD-10-CM | POA: Diagnosis not present

## 2013-07-22 DIAGNOSIS — J111 Influenza due to unidentified influenza virus with other respiratory manifestations: Secondary | ICD-10-CM

## 2013-07-22 DIAGNOSIS — R509 Fever, unspecified: Secondary | ICD-10-CM

## 2013-07-22 LAB — GLUCOSE, POCT (MANUAL RESULT ENTRY): POC Glucose: 347 mg/dl — AB (ref 70–99)

## 2013-07-22 LAB — POCT INFLUENZA A/B
Influenza A, POC: POSITIVE
Influenza B, POC: NEGATIVE

## 2013-07-22 LAB — POCT CBC
Hemoglobin: 13.3 g/dL (ref 12.2–16.2)
MCH, POC: 30.1 pg (ref 27–31.2)
MCV: 93.5 fL (ref 80–97)
RBC: 4.42 M/uL (ref 4.04–5.48)
WBC: 7.9 10*3/uL (ref 4.6–10.2)

## 2013-07-22 MED ORDER — OSELTAMIVIR PHOSPHATE 75 MG PO CAPS: 75.0000 mg | ORAL_CAPSULE | Freq: Two times a day (BID) | ORAL | Status: DC

## 2013-07-22 NOTE — Progress Notes (Signed)
Subjective:    Patient ID: [name], female    DOB: Mar 13, 1941, 72 y.o.   MRN: [id number]  HPI  Scribed for Lesle Chris MD, the patient was seen in room 1. This chart was scribed by Lewanda Rife, ED scribe. Patient's care was started at 2:25 PM Hx provided by the pt.  HPI Comments: [name] is a 72 y.o. female who presents to the [facility] with PMHx of DM complaining of persistent productive cough with yellow sputum onset 4 days. Reports associated fever of 101 at home. Reports trying OTC medications with mild relief of symptoms. Denies any aggravating factors. Denies generalized myalgias, sore throat and chills. Denies PMHx of pneumonia and smoking. Reports getting the flu shot this year.    Past Medical History  Diagnosis Date  . Hyperlipidemia   . Hypertension   . Depression     PMH of  . Osteoporosis   . Thyroid disease     right thyroid nodule-- BX DONE 08-15-2011  . DVT (deep venous thrombosis) 2006    after arthroscopy  . Obesity     Redux therapy  . MRSA (methicillin resistant staph aureus) culture positive     X3; last post TKR  . History of CVA (cerebrovascular accident) NEUROLOGIST  DR WILLIS----  06-01-2010-  LEFT BASAL GANGLIA HEMORRAGE    RESIDUAL RIGHT HAND NUMBNESS/TINGLING  . Numbness and tingling in right hand RESIDUAL FROM CVA NOV 2011  . Diabetes mellitus ORAL MED  . Anxiety   . Asymptomatic carotid artery stenosis LEFT --  MOD. PER DR WILLIS NOTE OCT 2012  . Benign heart murmur   . Nocturia   . Frequency of urination   . Arthritis HANDS, NECK , BACK    Past Surgical History  Procedure Laterality Date  . Carpal tunnel release  2000    RIGHT  . G2 p2    . Knee arthroscopy      BILATERAL PRIOR TO TOTAL KNEE  . Total knee arthroplasty  05-26-2002       LEFT   AND RIGHT  03-30-2098  . Lumbar laminectomy  08-26-2009    L 4 - 5  . Cystoscope      to evaluate recurrent UTIs  . Colonoscopy  1999 & 2005    Dr Juanda Chance    . Abdominal hysterectomy  1973    Endometriosis & fibroid  . Left shoulder surg  1990'S  . Joint replacement  03-28-2006    LEFT THUMB  . Thyroid nodule bx  08-15-2011    RIGHT  . Cystoscopy with biopsy  08/22/2011    Procedure: CYSTOSCOPY WITH BIOPSY;  Surgeon: Milford Cage, MD;  Location: [facility];  Service: Urology;  Laterality: N/A;  . Cystoscopy w/ retrogrades  08/22/2011    Procedure: CYSTOSCOPY WITH RETROGRADE PYELOGRAM;  Surgeon: Milford Cage, MD;  Location: [facility];  Service: Urology;  Laterality: Bilateral;  . Wrist surgery  2014    tendon placed in joint; Dr Mina Marble    Family History  Problem Relation Age of Onset  . Bladder Cancer Mother   . Diabetes Mother   . Brain cancer Sister   . Prostate cancer Brother   . Alzheimer's disease Brother   . Diabetes Maternal Aunt   . Diabetes Maternal Uncle   . Diabetes Maternal Grandmother     Type 2  . Diabetes Maternal Grandfather     Type 2  . Stroke  Paternal Grandfather     in 22s  . Diabetes Sister 3    TYPE 1   . Pancreatic cancer Sister   . Kidney failure Sister     in context of DM & influenza  . Heart disease Neg Hx     History   Social History  . Marital Status: Widowed    Spouse Name: N/A    Number of Children: N/A  . Years of Education: N/A   Occupational History  . Not on file.   Social History Main Topics  . Smoking status: Never Smoker   . Smokeless tobacco: Never Used  . Alcohol Use: Yes     Comment:  very rarely , < 1 glass wine / month  . Drug Use: No  . Sexual Activity: Not on file   Other Topics Concern  . Not on file   Social History Narrative  . No narrative on file    Allergies  Allergen Reactions  . Conjugated Estrogens     REACTION: weight gain  & fibrocystic breast disease changes  . Percocet [Oxycodone-Acetaminophen] Itching    Patient Active Problem List   Diagnosis Date Noted  . C V A/STROKE 05/31/2010  . Type  II or unspecified type diabetes mellitus without mention of complication, uncontrolled 01/24/2010  . DYSURIA 01/24/2010  . THYROID NODULE, RIGHT 01/21/2009  . FATIGUE 07/12/2008  . VITAMIN D DEFICIENCY 12/10/2007  . HYPERLIPIDEMIA 06/17/2007  . HYPERTENSION, ESSENTIAL NOS 06/17/2007  . Osteopenia 06/17/2007  . Osteoarth NOS-Unspec 11/11/2006       Review of Systems  Constitutional: Positive for fever. Negative for chills.  Respiratory: Positive for cough.   Musculoskeletal: Negative for myalgias.       Objective:   Physical Exam  Physical Exam  Nursing note and vitals reviewed. Constitutional: She is oriented to person, place, and time. She appears well-developed and well-nourished. No distress.  HENT:  Head: Normocephalic and atraumatic.  Throat: Oropharynx is clear. Uvula is midline.  Airway is intact. No oropharyngeal exudate, posterior oropharyngeal edema, or erythema. No tonsillar swelling. Eyes: EOM are normal.  Neck: Neck supple. No tracheal deviation present.  Cardiovascular: Normal rate and rhythm. Pulmonary/Chest: Effort normal. No respiratory distress. Lungs fields are clear. No rales, rhonchi, or wheezing. Musculoskeletal: Normal range of motion.  Neurological: She is alert and oriented to person, place, and time.  Skin: Skin is warm and dry.  Psychiatric: She has a normal mood and affect. Her behavior is normal.   COORDINATION OF CARE:  Nursing notes reviewed. Vital signs reviewed. Initial pt interview and examination performed.   2:30 PM-Discussed work up plan with pt at bedside, which includes nasal swab for flu, CXR, CBC with diff panel, and CBG. Pt agrees with plan.   Treatment plan initiated:Medications - No data to display   Initial diagnostic testing ordered.    Results for orders placed in visit on 07/22/13  POCT INFLUENZA A/B      Result Value Range   Influenza A, POC Positive     Influenza B, POC       [facility] reading (PRIMARY) by  Dr. Cleta Alberts  no acute disease Results for orders placed in visit on 07/22/13  POCT INFLUENZA A/B      Result Value Range   Influenza A, POC Positive     Influenza B, POC Negative    GLUCOSE, POCT (MANUAL RESULT ENTRY)      Result Value Range   POC Glucose 347 (*) 70 - 99  mg/dl  POCT CBC      Result Value Range   WBC 7.9  4.6 - 10.2 K/uL   Lymph, poc 2.0  0.6 - 3.4   POC LYMPH PERCENT 25.8  10 - 50 %L   MID (cbc) 0.8  0 - 0.9   POC MID % 9.5  0 - 12 %M   POC Granulocyte 5.1  2 - 6.9   Granulocyte percent 64.7  37 - 80 %G   RBC 4.42  4.04 - 5.48 M/uL   Hemoglobin 13.3  12.2 - 16.2 g/dL   HCT, POC 66.4  40.3 - 47.9 %   MCV 93.5  80 - 97 fL   MCH, POC 30.1  27 - 31.2 pg   MCHC 32.2  31.8 - 35.4 g/dL   RDW, POC 47.4     Platelet Count, POC 192  142 - 424 K/uL   MPV 9.7  0 - 99.8 fL       Assessment & Plan:   Clinical history and treat with Tamiflu cautioned her about her sugar being out of control. I personally performed the services described in this documentation, which was scribed in my presence. The recorded information has been reviewed and is accurate.

## 2013-07-22 NOTE — Patient Instructions (Signed)

## 2013-07-27 ENCOUNTER — Encounter: Payer: Self-pay | Admitting: Internal Medicine

## 2013-07-29 ENCOUNTER — Encounter: Payer: Self-pay | Admitting: Internal Medicine

## 2013-07-29 ENCOUNTER — Ambulatory Visit (INDEPENDENT_AMBULATORY_CARE_PROVIDER_SITE_OTHER): Payer: Medicare Other | Admitting: Internal Medicine

## 2013-07-29 VITALS — BP 139/83 | HR 82 | Temp 97.4°F | Resp 16 | Wt 182.0 lb

## 2013-07-29 DIAGNOSIS — R300 Dysuria: Secondary | ICD-10-CM | POA: Diagnosis not present

## 2013-07-29 DIAGNOSIS — IMO0001 Reserved for inherently not codable concepts without codable children: Secondary | ICD-10-CM | POA: Diagnosis not present

## 2013-07-29 DIAGNOSIS — N321 Vesicointestinal fistula: Secondary | ICD-10-CM | POA: Insufficient documentation

## 2013-07-29 DIAGNOSIS — E1165 Type 2 diabetes mellitus with hyperglycemia: Secondary | ICD-10-CM

## 2013-07-29 LAB — POCT URINALYSIS DIPSTICK
Glucose, UA: NEGATIVE
Nitrite, UA: NEGATIVE
PH UA: 5
PROTEIN UA: 100
Spec Grav, UA: >=1.03
UROBILINOGEN UA: 0.2

## 2013-07-29 MED ORDER — CIPROFLOXACIN HCL 500 MG PO TABS: 500.0000 mg | ORAL_TABLET | Freq: Two times a day (BID) | ORAL | Status: DC

## 2013-07-29 MED ORDER — GLIMEPIRIDE 1 MG PO TABS: 1.0000 mg | ORAL_TABLET | Freq: Every day | ORAL | Status: DC

## 2013-07-29 NOTE — Patient Instructions (Addendum)
Stay well hydrated. Drink to thirst up to 40 ounces of fluids daily. Your next office appointment will be determined based upon review of your pending cultures. Those instructions will be transmitted to you through My Chart. Followup as needed for your this acute issue. Please report any significant change in your symptoms.

## 2013-07-29 NOTE — Progress Notes (Signed)
Pre-visit discussion using our clinic review tool. No additional management support is needed unless otherwise documented below in the visit note.  

## 2013-07-29 NOTE — Progress Notes (Signed)
   Subjective:    Patient ID: [name], female    DOB: [birth year], 73 y.o.   MRN: [id number]  HPI   She was seen at an urgent care 07/22/13 with chills and fever. Influenza A was diagnosed on nasal swab and she was placed on Tamiflu which she completed  At that time and since she's had poorly controlled diabetes. Her fasting blood sugars are  in the low 200s-300s. 2 hours after eve meal sugars have been in the high 200s-300s. This is despite increasing her metformin to 1000 mg twice a day  She's had dysuria for approximately one week. She also describes increased thirst.  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  08/22/12 culture revealed Escherichia coli over 100,000 colonies. The organism was resistant to all antibiotics except for cefepime and ceftriaxone.  Proteus tablets .100,000 colonies were found on culture 06/21/13. This was resistant only to nitrofurantoin.  Her A1c was 7.6 and creatinine 0.9 in November of this year.    Review of Systems   She denies polyuria, polyphagia. She also denies pyuria or hematuria. She has no flank pain. She has no fever, chills, or sweats. She also denies diarrhea but has had some incontinence of mucoid stool intermittently..  She has a residual cough following the flu; this is improving without treatment. She has no symptoms of rhinosinusitis or bronchitis at this time.     Objective:   Physical Exam General appearance is one of good health and nourishment w/o distress.Appears younger than stated age  Eyes: No conjunctival inflammation or scleral icterus is present.  Oral exam: Dental hygiene is good; lips and gums are healthy appearing.There is no oropharyngeal erythema or exudate noted.   Heart:  Normal rate and regular rhythm. S1 and S2 normal without gallop,  click, rub or other extra sounds  .Grade 1/6 systolic murmur   Lungs:Chest  clear to auscultation; no wheezes, rhonchi,rales ,or rubs present.No increased work of breathing.   Abdomen: bowel sounds normal, soft but slightly tender  Suprapubic area without masses, organomegaly or hernias noted.  No guarding or rebound. No flank tenderness to percussion   Skin:Warm & dry.  Intact without suspicious lesions or rashes ; no jaundice. Minimal tenting  Lymphatic: No lymphadenopathy is noted about the head, neck, axilla            Assessment & Plan:  #1 urinary traction infection, recurrent. Present urinalysis reveals moderate leukocytes  #2 diabetes, poorly controlled  #3 slight intermittent fecal incontinence.  Plan: See orders

## 2013-07-30 ENCOUNTER — Telehealth: Payer: Self-pay

## 2013-07-30 NOTE — Telephone Encounter (Signed)
Relevant patient education assigned to patient using Emmi. ° °

## 2013-08-01 LAB — URINE CULTURE

## 2013-08-02 ENCOUNTER — Ambulatory Visit (INDEPENDENT_AMBULATORY_CARE_PROVIDER_SITE_OTHER): Payer: Medicare Other | Admitting: Emergency Medicine

## 2013-08-02 VITALS — BP 140/80 | HR 75 | Temp 98.2°F | Resp 16 | Ht 66.0 in | Wt 182.0 lb

## 2013-08-02 DIAGNOSIS — R300 Dysuria: Secondary | ICD-10-CM

## 2013-08-02 DIAGNOSIS — L0231 Cutaneous abscess of buttock: Secondary | ICD-10-CM

## 2013-08-02 DIAGNOSIS — IMO0001 Reserved for inherently not codable concepts without codable children: Secondary | ICD-10-CM | POA: Diagnosis not present

## 2013-08-02 DIAGNOSIS — L03317 Cellulitis of buttock: Secondary | ICD-10-CM

## 2013-08-02 DIAGNOSIS — E1165 Type 2 diabetes mellitus with hyperglycemia: Secondary | ICD-10-CM

## 2013-08-02 DIAGNOSIS — E119 Type 2 diabetes mellitus without complications: Secondary | ICD-10-CM

## 2013-08-02 LAB — POCT UA - MICROSCOPIC ONLY
Casts, Ur, LPF, POC: NEGATIVE
Crystals, Ur, HPF, POC: NEGATIVE
Epithelial cells, urine per micros: POSITIVE
Mucus, UA: NEGATIVE
Yeast, UA: NEGATIVE

## 2013-08-02 LAB — POCT URINALYSIS DIPSTICK
Bilirubin, UA: NEGATIVE
Glucose, UA: >=1000
Nitrite, UA: NEGATIVE
Protein, UA: 100
Spec Grav, UA: 1.025
Urobilinogen, UA: 0.2
pH, UA: 5

## 2013-08-02 LAB — GLUCOSE, POCT (MANUAL RESULT ENTRY): POC Glucose: 349 mg/dl — AB (ref 70–99)

## 2013-08-02 LAB — POCT GLYCOSYLATED HEMOGLOBIN (HGB A1C): Hemoglobin A1C: 8.4

## 2013-08-02 MED ORDER — SULFAMETHOXAZOLE-TMP DS 800-160 MG PO TABS: 1.0000 | ORAL_TABLET | Freq: Two times a day (BID) | ORAL | Status: DC

## 2013-08-02 MED ORDER — GLIMEPIRIDE 4 MG PO TABS: 4.0000 mg | ORAL_TABLET | Freq: Every day | ORAL | Status: DC

## 2013-08-02 NOTE — Progress Notes (Signed)
[facility] [address], [place] [address] [phone]  Date:  08/02/2013   Name:  [name]   DOB:  [birth year]   MRN:  [id number]  PCP:  Unice Cobble, MD    Chief Complaint: Dysuria, Hyperglycemia and Mass   History of Present Illness:  [name] is a 73 y.o. very pleasant female patient who presents with the following:  Had rectovaginal fistula repair in July complicated by a catheter related infection.  Seen by Dr Linna Darner 1/7 and found to have elevated sugar and infected appearing urine.  She was discharged on cipro.  She did not respond clinically in that her sugar is still in the 200-300 range.  She now has persistent urinary symptoms.  No fever or chills.  No nausea or vomiting or diarrhea.  No stool change.  No rash or cough.  No wheezing or shortness of breath.  Has numerous pustules on her buttocks.  Denies other complaint or health concern today.   Patient Active Problem List   Diagnosis Date Noted  . 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 C V A/STROKE 05/31/2010  . Type II or unspecified type diabetes mellitus without mention of complication, uncontrolled 01/24/2010  . DYSURIA 01/24/2010  . THYROID NODULE, RIGHT 01/21/2009  . FATIGUE 07/12/2008  . VITAMIN D DEFICIENCY 12/10/2007  . HYPERLIPIDEMIA 06/17/2007  . HYPERTENSION, ESSENTIAL NOS 06/17/2007  . Osteopenia 06/17/2007  . Osteoarth NOS-Unspec 11/11/2006    Past Medical History  Diagnosis Date  . Hyperlipidemia   . Hypertension   . Depression     PMH of  . Osteoporosis   . Thyroid disease     right thyroid nodule-- BX DONE 08-15-2011  . DVT (deep venous thrombosis) 2006    after arthroscopy  . Obesity     Redux therapy  . MRSA (methicillin resistant staph aureus) culture positive     X3; last post TKR  . History of CVA (cerebrovascular accident) NEUROLOGIST  DR WILLIS----  06-01-2010-  LEFT BASAL GANGLIA HEMORRAGE    RESIDUAL RIGHT HAND NUMBNESS/TINGLING  . Numbness  and tingling in right hand RESIDUAL FROM CVA NOV 2011  . Diabetes mellitus ORAL MED  . Anxiety   . Asymptomatic carotid artery stenosis LEFT --  MOD. PER DR WILLIS NOTE OCT 2012  . Benign heart murmur   . Nocturia   . Frequency of urination   . Arthritis HANDS, NECK , BACK    Past Surgical History  Procedure Laterality Date  . Carpal tunnel release  2000    RIGHT  . G2 p2    . Knee arthroscopy      BILATERAL PRIOR TO TOTAL KNEE  . Total knee arthroplasty  05-26-2002       LEFT   AND RIGHT  03-30-2098  . Lumbar laminectomy  08-26-2009    L 4 - 5  . Cystoscope      to evaluate recurrent UTIs  . Colonoscopy  1999 & 2005    Dr Olevia Perches  . Abdominal hysterectomy  1973    Endometriosis & fibroid  . Left shoulder surg  1990'S  . Joint replacement  03-28-2006    LEFT THUMB  . Thyroid nodule bx  08-15-2011    RIGHT  . Cystoscopy with biopsy  08/22/2011    Procedure: CYSTOSCOPY WITH BIOPSY;  Surgeon: Molli Hazard, MD;  Location: [facility];  Service: Urology;  Laterality: N/A;  . Cystoscopy w/ retrogrades  08/22/2011  Procedure: CYSTOSCOPY WITH RETROGRADE PYELOGRAM;  Surgeon: Molli Hazard, MD;  Location: [facility];  Service: Urology;  Laterality: Bilateral;  . Wrist surgery  2014    tendon placed in joint; Dr Burney Gauze    History  Substance Use Topics  . Smoking status: Never Smoker   . Smokeless tobacco: Never Used  . Alcohol Use: Yes     Comment:  very rarely , < 1 glass wine / month    Family History  Problem Relation Age of Onset  . Bladder Cancer Mother   . Diabetes Mother   . Brain cancer Sister   . Prostate cancer Brother   . Alzheimer's disease Brother   . Diabetes Maternal Aunt   . Diabetes Maternal Uncle   . Diabetes Maternal Grandmother     Type 2  . Diabetes Maternal Grandfather     Type 2  . Stroke Paternal Grandfather     in 22s  . Diabetes Sister 53    TYPE 1   . Pancreatic cancer Sister   . Kidney  failure Sister     in context of DM & influenza  . Heart disease Neg Hx     Allergies  Allergen Reactions  . Conjugated Estrogens     REACTION: weight gain  & fibrocystic breast disease changes  . Percocet [Oxycodone-Acetaminophen] Itching  . Detrol [Tolterodine]     Rxed by [facility] Excessive drying    Medication list has been reviewed and updated.  Current Outpatient Prescriptions on File Prior to Visit  Medication Sig Dispense Refill  . aspirin 81 MG tablet Take 81 mg by mouth daily.      . benazepril (LOTENSIN) 20 MG tablet TAKE 1 TABLET DAILY  90 tablet  1  . calcium citrate-vitamin D 500-400 MG-UNIT chewable tablet Chew 1 tablet by mouth daily.      . Cholecalciferol (VITAMIN D3) 1000 UNITS CAPS Take 1 capsule by mouth daily.      . ciprofloxacin (CIPRO) 500 MG tablet Take 1 tablet (500 mg total) by mouth 2 (two) times daily.  14 tablet  0  . citalopram (CELEXA) 20 MG tablet TAKE 1 TABLET DAILY  90 tablet  1  . clonazePAM (KLONOPIN) 0.5 MG tablet Take 1 tablet (0.5 mg total) by mouth at bedtime.  90 tablet  1  . glimepiride (AMARYL) 1 MG tablet Take 1 tablet (1 mg total) by mouth daily with breakfast.  60 tablet  2  . glucose blood (FREESTYLE LITE) test strip Check blood sugar daily as directed, DX: 250.00  100 each  3  . metFORMIN (GLUCOPHAGE) 1000 MG tablet Take one-half tablet twice a day with meals.  90 tablet  1  . methylcellulose (ARTIFICIAL TEARS) 1 % ophthalmic solution Place 1 drop into both eyes as needed.      . Multiple Vitamins-Calcium (ONE-A-DAY WOMENS FORMULA) TABS Take 1 tablet by mouth daily.      Marland Kitchen oseltamivir (TAMIFLU) 75 MG capsule Take 1 capsule (75 mg total) by mouth 2 (two) times daily.  10 capsule  0  . pravastatin (PRAVACHOL) 40 MG tablet TAKE 1 TABLET AT BEDTIME  90 tablet  2   No current facility-administered medications on file prior to visit.    Review of Systems:  As per HPI, otherwise negative.    Physical Examination: Filed Vitals:    08/02/13 1123  BP: 140/80  Pulse: 75  Temp: 98.2 F (36.8 C)  Resp: 16   Filed Vitals:  08/02/13 1123  Height: 5\' 6"  (1.676 m)  Weight: 182 lb (82.555 kg)   Body mass index is 29.39 kg/(m^2). Ideal Body Weight: Weight in (lb) to have BMI = 25: 154.6  GEN: WDWN, NAD, Non-toxic, A & O x 3 HEENT: Atraumatic, Normocephalic. Neck supple. No masses, No LAD. Ears and Nose: No external deformity. CV: RRR, No M/G/R. No JVD. No thrill. No extra heart sounds. PULM: CTA B, no wheezes, crackles, rhonchi. No retractions. No resp. distress. No accessory muscle use. ABD: S, NT, ND, +BS. No rebound. No HSM. EXTR: No c/c/e NEURO Normal gait.  PSYCH: Normally interactive. Conversant. Not depressed or anxious appearing.  Calm demeanor.  BUTTOCKS:  Numerous pustules no abscesses nor cellulitis  Assessment and Plan: Dysuria Possible UTI NIDDM out of control Increase glimiperide Follow up with Dr Linna Darner  Signed,  Ellison Carwin, MD   Results for orders placed in visit on 08/02/13  POCT UA - MICROSCOPIC ONLY      Result Value Range   WBC, Ur, HPF, POC tntc     RBC, urine, microscopic 0-2     Bacteria, U Microscopic trace     Mucus, UA neg     Epithelial cells, urine per micros positive     Crystals, Ur, HPF, POC neg     Casts, Ur, LPF, POC neg     Yeast, UA neg    POCT URINALYSIS DIPSTICK      Result Value Range   Color, UA yellow     Clarity, UA turbid     Glucose, UA >=1000     Bilirubin, UA neg     Ketones, UA trace     Spec Grav, UA 1.025     Blood, UA moderate     pH, UA 5.0     Protein, UA 100     Urobilinogen, UA 0.2     Nitrite, UA neg     Leukocytes, UA moderate (2+)    GLUCOSE, POCT (MANUAL RESULT ENTRY)      Result Value Range   POC Glucose 349 (*) 70 - 99 mg/dl  POCT GLYCOSYLATED HEMOGLOBIN (HGB A1C)      Result Value Range   Hemoglobin A1C 8.4

## 2013-08-02 NOTE — Patient Instructions (Signed)
Type 2 Diabetes Mellitus, Adult Type 2 diabetes mellitus, often simply referred to as type 2 diabetes, is a long-lasting (chronic) disease. In type 2 diabetes, the pancreas does not make enough insulin (a hormone), the cells are less responsive to the insulin that is made (insulin resistance), or both. Normally, insulin moves sugars from food into the tissue cells. The tissue cells use the sugars for energy. The lack of insulin or the lack of normal response to insulin causes excess sugars to build up in the blood instead of going into the tissue cells. As a result, high blood sugar (hyperglycemia) develops. The effect of high sugar (glucose) levels can cause many complications. Type 2 diabetes was also previously called adult-onset diabetes but it can occur at any age.  RISK FACTORS  A person is predisposed to developing type 2 diabetes if someone in the family has the disease and also has one or more of the following primary risk factors:  Overweight.  An inactive lifestyle.  A history of consistently eating high-calorie foods. Maintaining a normal weight and regular physical activity can reduce the chance of developing type 2 diabetes. SYMPTOMS  A person with type 2 diabetes may not show symptoms initially. The symptoms of type 2 diabetes appear slowly. The symptoms include:  Increased thirst (polydipsia).  Increased urination (polyuria).  Increased urination during the night (nocturia).  Weight loss. This weight loss may be rapid.  Frequent, recurring infections.  Tiredness (fatigue).  Weakness.  Vision changes, such as blurred vision.  Fruity smell to your breath.  Abdominal pain.  Nausea or vomiting.  Cuts or bruises which are slow to heal.  Tingling or numbness in the hands or feet. DIAGNOSIS Type 2 diabetes is frequently not diagnosed until complications of diabetes are present. Type 2 diabetes is diagnosed when symptoms or complications are present and when blood  glucose levels are increased. Your blood glucose level may be checked by one or more of the following blood tests:  A fasting blood glucose test. You will not be allowed to eat for at least 8 hours before a blood sample is taken.  A random blood glucose test. Your blood glucose is checked at any time of the day regardless of when you ate.  A hemoglobin A1c blood glucose test. A hemoglobin A1c test provides information about blood glucose control over the previous 3 months.  An oral glucose tolerance test (OGTT). Your blood glucose is measured after you have not eaten (fasted) for 2 hours and then after you drink a glucose-containing beverage. TREATMENT   You may need to take insulin or diabetes medicine daily to keep blood glucose levels in the desired range.  You will need to match insulin dosing with exercise and healthy food choices. The treatment goal is to maintain the before meal blood sugar (preprandial glucose) level at 70 130 mg/dL. HOME CARE INSTRUCTIONS   Have your hemoglobin A1c level checked twice a year.  Perform daily blood glucose monitoring as directed by your caregiver.  Monitor urine ketones when you are ill and as directed by your caregiver.  Take your diabetes medicine or insulin as directed by your caregiver to maintain your blood glucose levels in the desired range.  Never run out of diabetes medicine or insulin. It is needed every day.  Adjust insulin based on your intake of carbohydrates. Carbohydrates can raise blood glucose levels but need to be included in your diet. Carbohydrates provide vitamins, minerals, and fiber which are an essential part of   a healthy diet. Carbohydrates are found in fruits, vegetables, whole grains, dairy products, legumes, and foods containing added sugars.    Eat healthy foods. Alternate 3 meals with 3 snacks.  Lose weight if overweight.  Carry a medical alert card or wear your medical alert jewelry.  Carry a 15 gram  carbohydrate snack with you at all times to treat low blood glucose (hypoglycemia). Some examples of 15 gram carbohydrate snacks include:  Glucose tablets, 3 or 4   Glucose gel, 15 gram tube  Raisins, 2 tablespoons (24 grams)  Jelly beans, 6  Animal crackers, 8  Regular pop, 4 ounces (120 mL)  Gummy treats, 9  Recognize hypoglycemia. Hypoglycemia occurs with blood glucose levels of 70 mg/dL and below. The risk for hypoglycemia increases when fasting or skipping meals, during or after intense exercise, and during sleep. Hypoglycemia symptoms can include:  Tremors or shakes.  Decreased ability to concentrate.  Sweating.  Increased heart rate.  Headache.  Dry mouth.  Hunger.  Irritability.  Anxiety.  Restless sleep.  Altered speech or coordination.  Confusion.  Treat hypoglycemia promptly. If you are alert and able to safely swallow, follow the 15:15 rule:  Take 15 20 grams of rapid-acting glucose or carbohydrate. Rapid-acting options include glucose gel, glucose tablets, or 4 ounces (120 mL) of fruit juice, regular soda, or low fat milk.  Check your blood glucose level 15 minutes after taking the glucose.  Take 15 20 grams more of glucose if the repeat blood glucose level is still 70 mg/dL or below.  Eat a meal or snack within 1 hour once blood glucose levels return to normal.    Be alert to polyuria and polydipsia which are early signs of hyperglycemia. An early awareness of hyperglycemia allows for prompt treatment. Treat hyperglycemia as directed by your caregiver.  Engage in at least 150 minutes of moderate-intensity physical activity a week, spread over at least 3 days of the week or as directed by your caregiver. In addition, you should engage in resistance exercise at least 2 times a week or as directed by your caregiver.  Adjust your medicine and food intake as needed if you start a new exercise or sport.  Follow your sick day plan at any time you  are unable to eat or drink as usual.  Avoid tobacco use.  Limit alcohol intake to no more than 1 drink per day for nonpregnant women and 2 drinks per day for men. You should drink alcohol only when you are also eating food. Talk with your caregiver whether alcohol is safe for you. Tell your caregiver if you drink alcohol several times a week.  Follow up with your caregiver regularly.  Schedule an eye exam soon after the diagnosis of type 2 diabetes and then annually.  Perform daily skin and foot care. Examine your skin and feet daily for cuts, bruises, redness, nail problems, bleeding, blisters, or sores. A foot exam by a caregiver should be done annually.  Brush your teeth and gums at least twice a day and floss at least once a day. Follow up with your dentist regularly.  Share your diabetes management plan with your workplace or school.  Stay up-to-date with immunizations.  Learn to manage stress.  Obtain ongoing diabetes education and support as needed.  Participate in, or seek rehabilitation as needed to maintain or improve independence and quality of life. Request a physical or occupational therapy referral if you are having foot or hand numbness or difficulties with grooming,   dressing, eating, or physical activity. SEEK MEDICAL CARE IF:   You are unable to eat food or drink fluids for more than 6 hours.  You have nausea and vomiting for more than 6 hours.  Your blood glucose level is over 240 mg/dL.  There is a change in mental status.  You develop an additional serious illness.  You have diarrhea for more than 6 hours.  You have been sick or have had a fever for a couple of days and are not getting better.  You have pain during any physical activity.  SEEK IMMEDIATE MEDICAL CARE IF:  You have difficulty breathing.  You have moderate to large ketone levels. MAKE SURE YOU:  Understand these instructions.  Will watch your condition.  Will get help right away if  you are not doing well or get worse. Document Released: 07/09/2005 Document Revised: 04/02/2012 Document Reviewed: 02/05/2012 [employer] Patient Information 2014 [employer].  

## 2013-08-02 NOTE — Addendum Note (Signed)
Addended by: Roselee Culver on: 08/02/2013 12:21 PM   Modules accepted: Orders

## 2013-08-03 ENCOUNTER — Encounter: Payer: Self-pay | Admitting: Internal Medicine

## 2013-08-03 LAB — URINE CULTURE
COLONY COUNT: NO GROWTH
ORGANISM ID, BACTERIA: NO GROWTH

## 2013-08-04 DIAGNOSIS — N390 Urinary tract infection, site not specified: Secondary | ICD-10-CM | POA: Diagnosis not present

## 2013-08-04 DIAGNOSIS — R300 Dysuria: Secondary | ICD-10-CM | POA: Diagnosis not present

## 2013-08-05 ENCOUNTER — Telehealth: Payer: Self-pay | Admitting: *Deleted

## 2013-08-05 NOTE — Telephone Encounter (Signed)
Spoke with the pt and she stated that she was seen by Urology on yest and they started treatment.//AB/CMA

## 2013-08-05 NOTE — Telephone Encounter (Signed)
Message copied by Harl Bowie on Wed Aug 05, 2013  3:06 PM ------      Message from: Hendricks Limes      Created: Tue Aug 04, 2013  9:22 AM       Resistant urinary tract infection present; urology followup indicated ------

## 2013-08-27 DIAGNOSIS — N390 Urinary tract infection, site not specified: Secondary | ICD-10-CM | POA: Diagnosis not present

## 2013-08-27 DIAGNOSIS — N321 Vesicointestinal fistula: Secondary | ICD-10-CM | POA: Diagnosis not present

## 2013-08-27 DIAGNOSIS — N3941 Urge incontinence: Secondary | ICD-10-CM | POA: Diagnosis not present

## 2013-08-27 DIAGNOSIS — N3946 Mixed incontinence: Secondary | ICD-10-CM | POA: Diagnosis not present

## 2013-09-22 DIAGNOSIS — R32 Unspecified urinary incontinence: Secondary | ICD-10-CM | POA: Diagnosis not present

## 2013-09-23 DIAGNOSIS — H43819 Vitreous degeneration, unspecified eye: Secondary | ICD-10-CM | POA: Diagnosis not present

## 2013-09-23 DIAGNOSIS — H35379 Puckering of macula, unspecified eye: Secondary | ICD-10-CM | POA: Diagnosis not present

## 2013-09-23 DIAGNOSIS — E119 Type 2 diabetes mellitus without complications: Secondary | ICD-10-CM | POA: Diagnosis not present

## 2013-09-25 DIAGNOSIS — R32 Unspecified urinary incontinence: Secondary | ICD-10-CM | POA: Diagnosis not present

## 2013-09-25 DIAGNOSIS — N393 Stress incontinence (female) (male): Secondary | ICD-10-CM | POA: Diagnosis not present

## 2013-09-25 DIAGNOSIS — N8111 Cystocele, midline: Secondary | ICD-10-CM | POA: Diagnosis not present

## 2013-09-25 DIAGNOSIS — N816 Rectocele: Secondary | ICD-10-CM | POA: Diagnosis not present

## 2013-10-03 ENCOUNTER — Encounter: Payer: Self-pay | Admitting: Internal Medicine

## 2013-10-15 ENCOUNTER — Encounter: Payer: Self-pay | Admitting: Internal Medicine

## 2013-10-15 ENCOUNTER — Other Ambulatory Visit (INDEPENDENT_AMBULATORY_CARE_PROVIDER_SITE_OTHER): Payer: Medicare Other

## 2013-10-15 ENCOUNTER — Ambulatory Visit (INDEPENDENT_AMBULATORY_CARE_PROVIDER_SITE_OTHER): Payer: Medicare Other | Admitting: Internal Medicine

## 2013-10-15 VITALS — BP 145/70 | HR 59 | Temp 98.3°F | Resp 18 | Ht 66.0 in | Wt 190.4 lb

## 2013-10-15 DIAGNOSIS — IMO0001 Reserved for inherently not codable concepts without codable children: Secondary | ICD-10-CM | POA: Diagnosis not present

## 2013-10-15 DIAGNOSIS — E1165 Type 2 diabetes mellitus with hyperglycemia: Principal | ICD-10-CM

## 2013-10-15 DIAGNOSIS — I10 Essential (primary) hypertension: Secondary | ICD-10-CM | POA: Diagnosis not present

## 2013-10-15 LAB — HEMOGLOBIN A1C: Hgb A1c MFr Bld: 6.9 % — ABNORMAL HIGH (ref 4.6–6.5)

## 2013-10-15 LAB — BASIC METABOLIC PANEL
BUN: 20 mg/dL (ref 6–23)
CHLORIDE: 103 meq/L (ref 96–112)
CO2: 28 meq/L (ref 19–32)
Calcium: 9.8 mg/dL (ref 8.4–10.5)
Creatinine, Ser: 0.7 mg/dL (ref 0.4–1.2)
GFR: 86.66 mL/min (ref >60.00)
GLUCOSE: 124 mg/dL — AB (ref 70–99)
POTASSIUM: 4.2 meq/L (ref 3.5–5.1)
SODIUM: 137 meq/L (ref 135–145)

## 2013-10-15 MED ORDER — METFORMIN HCL 1000 MG PO TABS: ORAL_TABLET | ORAL | Status: DC

## 2013-10-15 MED ORDER — GLIMEPIRIDE 1 MG PO TABS: 1.0000 mg | ORAL_TABLET | Freq: Every day | ORAL | Status: DC

## 2013-10-15 NOTE — Progress Notes (Signed)
Subjective:    Patient ID: [name], female    DOB: Nov 26, 1940, 73 y.o.   MRN: [id number]  HPI Diabetes status assessment: Fasting or morning glucose range average is 128-182, usually 140's-160's Glucose 2 hours after any meal is not monitored. Hypoglycemic episodes in the afternoon when she was on 4 mg of Glyburide, pt changed the dose to 1mg  . The UC had increased it as well as the dose of Metformin when seen for UTI in 07/2013                                                                                                              Exercise described as water aerobics 63minutes 3 times per week .Walks 1 mpd weather permitting Low carb diet  nutrition/diet followed Medication compliance is good. No medication adverse effects noted after decrease in dose of Glimiperide Eye exam current. Foot care not current  A1c 7.6% on 06/08/13; prior values reviewed  Review of Systems   No excess thirst ;  excess hunger ; or excess urination reported                              No lightheadedness with standing reported No chest pain ; palpitations ; claudication described .                                                                                                                             No non healing skin  ulcers or sores of extremities noted. No numbness or tingling or burning in feet described                                                                                                                                             No significant change in weight of pound gain pound loss. No  blurred,double, or loss of vision reported   S/P fistula surgery @ [facility]; additional surgery being considered       Objective:   Physical Exam Appears healthy and well-nourished & in no acute distress.Appears younger than stated age  No carotid bruits are present.No neck pain distention present at 10 - 15 degrees. Thyroid normal to palpation  Heart rhythm and rate are normal with no  gallop or murmur  Chest is clear with no increased work of breathing  There is no evidence of aortic aneurysm or renal artery bruits  Abdomen soft with no organomegaly or masses. No HJR  No clubbing, cyanosis or edema present.  Pedal pulses are intact   No ischemic skin changes are present . Fingernails/ toenails healthy   Alert and oriented. Strength, tone, DTRs reflexes normal  Light touch normal over feet.          Assessment & Plan:  See Current Assessment & Plan in Problem List under specific Diagnosis

## 2013-10-15 NOTE — Patient Instructions (Signed)
Your next office appointment will be determined based upon review of your pending labs . Those instructions will be transmitted to you  by mail. 

## 2013-10-16 NOTE — Assessment & Plan Note (Signed)
   Recheck A1c as well as BMET; renal function must be verified in view of the relatively high dose of metformin at her age.  Glimiperide hopefully can be discontinued. Certainly it should  be avoided at high doses because of the risk of hypoglycemia.

## 2013-10-16 NOTE — Assessment & Plan Note (Signed)
BMET BP goals reviewed

## 2013-10-19 ENCOUNTER — Encounter: Payer: Self-pay | Admitting: *Deleted

## 2013-10-19 ENCOUNTER — Other Ambulatory Visit: Payer: Self-pay | Admitting: Internal Medicine

## 2013-11-02 DIAGNOSIS — N3946 Mixed incontinence: Secondary | ICD-10-CM | POA: Diagnosis not present

## 2013-11-25 ENCOUNTER — Other Ambulatory Visit: Payer: Self-pay | Admitting: Internal Medicine

## 2013-11-25 NOTE — Telephone Encounter (Signed)
Last ov 10/15/2013  Med last filled 06/08/2013 #90 plus 1

## 2013-11-25 NOTE — Telephone Encounter (Signed)
OK X1, R X 2 

## 2013-12-07 ENCOUNTER — Telehealth: Payer: Self-pay | Admitting: Neurology

## 2013-12-07 NOTE — Telephone Encounter (Signed)
I called patient, left a message for her to contact our office if she is amenable to having a carotid Doppler study done as a followup from the last study one year ago. The patient has bilateral carotid vessel disease.

## 2013-12-07 NOTE — Telephone Encounter (Signed)
Message copied by Margette Fast on Mon Dec 07, 2013  8:11 AM ------      Message from: Margette Fast      Created: Mon Dec 08, 2012 12:41 PM       Recheck carotid doppler. ------

## 2013-12-09 ENCOUNTER — Other Ambulatory Visit: Payer: Self-pay | Admitting: Neurology

## 2013-12-09 DIAGNOSIS — I6529 Occlusion and stenosis of unspecified carotid artery: Secondary | ICD-10-CM

## 2013-12-10 ENCOUNTER — Other Ambulatory Visit: Payer: Self-pay

## 2013-12-10 MED ORDER — CLONAZEPAM 0.5 MG PO TABS: 0.5000 mg | ORAL_TABLET | Freq: Every day | ORAL | Status: DC

## 2013-12-10 NOTE — Telephone Encounter (Signed)
#  90, NR

## 2013-12-10 NOTE — Telephone Encounter (Signed)
Last office visit 10/15/2013 Med last filled 06/08/13 #90 plus 1 refill

## 2013-12-11 ENCOUNTER — Other Ambulatory Visit: Payer: Self-pay | Admitting: Internal Medicine

## 2013-12-18 ENCOUNTER — Ambulatory Visit (INDEPENDENT_AMBULATORY_CARE_PROVIDER_SITE_OTHER): Payer: Medicare Other

## 2013-12-18 DIAGNOSIS — I6529 Occlusion and stenosis of unspecified carotid artery: Secondary | ICD-10-CM | POA: Diagnosis not present

## 2013-12-22 ENCOUNTER — Telehealth: Payer: Self-pay | Admitting: Neurology

## 2013-12-22 NOTE — Telephone Encounter (Signed)
I called the patient. The carotid doppler study suggests mild worsening of the left ICA. The stenosis is still in the same range on both sides, 50-69% in the mid carotid vessels with some left distal ICA stenosis as well. We will recheck the study in one year.

## 2014-01-27 ENCOUNTER — Telehealth: Payer: Self-pay | Admitting: *Deleted

## 2014-01-27 DIAGNOSIS — IMO0001 Reserved for inherently not codable concepts without codable children: Secondary | ICD-10-CM

## 2014-01-27 DIAGNOSIS — E1165 Type 2 diabetes mellitus with hyperglycemia: Principal | ICD-10-CM

## 2014-01-27 NOTE — Telephone Encounter (Signed)
Left message on machine for patient to come by the lab for cholesterol check. Lipid ordered Message sent to mychart Diabetic bundle

## 2014-02-02 ENCOUNTER — Other Ambulatory Visit: Payer: Self-pay | Admitting: Internal Medicine

## 2014-02-02 ENCOUNTER — Telehealth: Payer: Self-pay

## 2014-02-02 ENCOUNTER — Other Ambulatory Visit (INDEPENDENT_AMBULATORY_CARE_PROVIDER_SITE_OTHER): Payer: Medicare Other

## 2014-02-02 DIAGNOSIS — R82998 Other abnormal findings in urine: Secondary | ICD-10-CM | POA: Diagnosis not present

## 2014-02-02 DIAGNOSIS — E1165 Type 2 diabetes mellitus with hyperglycemia: Principal | ICD-10-CM

## 2014-02-02 DIAGNOSIS — R8290 Unspecified abnormal findings in urine: Secondary | ICD-10-CM

## 2014-02-02 DIAGNOSIS — IMO0001 Reserved for inherently not codable concepts without codable children: Secondary | ICD-10-CM

## 2014-02-02 LAB — URINALYSIS, ROUTINE W REFLEX MICROSCOPIC
Bilirubin Urine: NEGATIVE
Hgb urine dipstick: NEGATIVE
KETONES UR: NEGATIVE
Nitrite: POSITIVE — AB
RBC / HPF: NONE SEEN (ref 0–?)
Specific Gravity, Urine: 1.01 (ref 1.000–1.030)
TOTAL PROTEIN, URINE-UPE24: NEGATIVE
UROBILINOGEN UA: 0.2 (ref 0.0–1.0)
Urine Glucose: NEGATIVE
pH: 6.5 (ref 5.0–8.0)

## 2014-02-02 LAB — LIPID PANEL
Cholesterol: 115 mg/dL (ref 0–200)
HDL: 39.7 mg/dL (ref >39.00)
LDL Cholesterol: 60 mg/dL (ref 0–99)
NONHDL: 75.3
TRIGLYCERIDES: 79 mg/dL (ref 0.0–149.0)
Total CHOL/HDL Ratio: 3
VLDL: 15.8 mg/dL (ref 0.0–40.0)

## 2014-02-02 NOTE — Progress Notes (Signed)
Patient came in for other lab and needed to leave a urine sample since she is having urine abnormalities.

## 2014-02-02 NOTE — Telephone Encounter (Signed)
Message copied by Shelly Coss on Tue Feb 02, 2014  1:17 PM ------      Message from: Hendricks Limes      Created: Tue Feb 02, 2014  1:11 PM       Urine needs to be cultured ------

## 2014-02-02 NOTE — Telephone Encounter (Signed)
Urine culture order has already been placed

## 2014-02-04 ENCOUNTER — Telehealth: Payer: Self-pay

## 2014-02-04 LAB — URINE CULTURE

## 2014-02-04 NOTE — Telephone Encounter (Signed)
Message copied by Shelly Coss on Thu Feb 04, 2014  8:17 AM ------      Message from: Hendricks Limes      Created: Thu Feb 04, 2014  8:12 AM                   There is Escherichia coli urinary tract infection. The last time this organism was isolated it was resistant to all but IV medications. I am waiting the final sensitivities rather than prescribing an antibiotic which may not be of benefit.             ------

## 2014-02-04 NOTE — Telephone Encounter (Signed)
Patient has been advised

## 2014-02-05 ENCOUNTER — Encounter: Payer: Self-pay | Admitting: Internal Medicine

## 2014-02-05 DIAGNOSIS — N393 Stress incontinence (female) (male): Secondary | ICD-10-CM | POA: Insufficient documentation

## 2014-02-09 ENCOUNTER — Telehealth: Payer: Self-pay | Admitting: Internal Medicine

## 2014-02-09 NOTE — Telephone Encounter (Signed)
I called and spoke to the patient let her know per Dr Linna Darner cholesterol is great urine showed a uti that's resistant to antibiotics. Transferred her to scheduling for an appt tomorrow per Dr Linna Darner.

## 2014-02-09 NOTE — Telephone Encounter (Signed)
Pt request lab result that was done on 02/02/14. Pt stated we were suppose to get the result and send in some med for her. Please call pt

## 2014-02-10 ENCOUNTER — Ambulatory Visit (INDEPENDENT_AMBULATORY_CARE_PROVIDER_SITE_OTHER): Payer: Medicare Other | Admitting: Internal Medicine

## 2014-02-10 ENCOUNTER — Other Ambulatory Visit (INDEPENDENT_AMBULATORY_CARE_PROVIDER_SITE_OTHER): Payer: Medicare Other

## 2014-02-10 ENCOUNTER — Encounter: Payer: Self-pay | Admitting: Internal Medicine

## 2014-02-10 VITALS — BP 150/80 | HR 85 | Temp 98.2°F | Wt 189.0 lb

## 2014-02-10 DIAGNOSIS — N390 Urinary tract infection, site not specified: Secondary | ICD-10-CM

## 2014-02-10 DIAGNOSIS — E1165 Type 2 diabetes mellitus with hyperglycemia: Secondary | ICD-10-CM

## 2014-02-10 DIAGNOSIS — IMO0001 Reserved for inherently not codable concepts without codable children: Secondary | ICD-10-CM

## 2014-02-10 DIAGNOSIS — A498 Other bacterial infections of unspecified site: Secondary | ICD-10-CM

## 2014-02-10 DIAGNOSIS — B9620 Unspecified Escherichia coli [E. coli] as the cause of diseases classified elsewhere: Secondary | ICD-10-CM

## 2014-02-10 DIAGNOSIS — I6529 Occlusion and stenosis of unspecified carotid artery: Secondary | ICD-10-CM

## 2014-02-10 LAB — HEMOGLOBIN A1C: HEMOGLOBIN A1C: 7.4 % — AB (ref 4.6–6.5)

## 2014-02-10 MED ORDER — NITROFURANTOIN MONOHYD MACRO 100 MG PO CAPS: 100.0000 mg | ORAL_CAPSULE | Freq: Two times a day (BID) | ORAL | Status: DC

## 2014-02-10 NOTE — Progress Notes (Signed)
   Subjective:    Patient ID: [name], female    DOB: [birth year], 73 y.o.   MRN: [id number]  HPI   Her urine culture 02/02/14 grew over 100,000 colonies of Escherichia coli. She was notified of the results through the computer (My Chart); but she has not been using this program as we believed.  Unfortunately the organism is resistant to all oral agents except for an intermediate sensitivity to nitrofurantoin.  She's been using outdated medicines prescribed by her Urologist, Uribel 3 times a day for 7 days and then Senna plus twice a day for 5 days to treat to dysuria. There has been some benefit.  She continues to have dysuria, urgency, frequency, nocturia and low back pain.   Review of Systems  She is not monitoring her glucoses at home  She denies hesitancy, hematuria, pyuria, fever, chills, or sweats.      Objective:   Physical Exam   There is some tenderness to palpation of the suprapubic area. She has some discomfort to light percussion over the mid lumbosacral spine. Straight leg raising is negative. She has fusiform changes the knees with some crepitus. Otherwise exam is unremarkable.  General appearance is one of good health and nourishment w/o distress.Appears younger than stated age Eyes: No conjunctival inflammation or scleral icterus is present. Oral exam: Dental hygiene is good; lips and gums are healthy appearing.There is no oropharyngeal erythema or exudate noted.  Heart:  Normal rate and regular rhythm. S1 and S2 normal without gallop, murmur, click, rub or other extra sounds   Lungs:Chest clear to auscultation; no wheezes, rhonchi,rales ,or rubs present.No increased work of breathing.  Abdomen: bowel sounds normal, soft  without masses, organomegaly or hernias noted.  No guarding or rebound . No tenderness over the flanks to percussion Musculoskeletal: Able to lie flat and sit up without help. Negative straight leg raising bilaterally. Gait normal Skin:Warm &  dry.  Intact without suspicious lesions or rashes ; no jaundice or tenting Lymphatic: No lymphadenopathy is noted about the head, neck, axilla                 Assessment & Plan:  #1 resistant UTI #2 DM , ? status See orders

## 2014-02-10 NOTE — Patient Instructions (Signed)
TheUrology referral will be scheduled and you'll be notified of the time.Drink as much nondairy fluids as possible. Avoid spicy foods or alcohol as  these may aggravate the bladder. Do not take decongestants. Avoid narcotics if possible.  Your next office appointment will be determined based upon review of your pending labs . Those instructions will be transmitted to you  by mail

## 2014-02-10 NOTE — Progress Notes (Signed)
Pre visit review using our clinic review tool, if applicable. No additional management support is needed unless otherwise documented below in the visit note. 

## 2014-02-11 ENCOUNTER — Other Ambulatory Visit: Payer: Self-pay | Admitting: Internal Medicine

## 2014-02-11 DIAGNOSIS — IMO0001 Reserved for inherently not codable concepts without codable children: Secondary | ICD-10-CM

## 2014-02-11 DIAGNOSIS — E1165 Type 2 diabetes mellitus with hyperglycemia: Principal | ICD-10-CM

## 2014-02-25 ENCOUNTER — Other Ambulatory Visit: Payer: Self-pay | Admitting: Internal Medicine

## 2014-03-02 ENCOUNTER — Other Ambulatory Visit: Payer: Self-pay | Admitting: Obstetrics and Gynecology

## 2014-03-02 DIAGNOSIS — Z124 Encounter for screening for malignant neoplasm of cervix: Secondary | ICD-10-CM | POA: Diagnosis not present

## 2014-03-04 LAB — CYTOLOGY - PAP

## 2014-03-07 ENCOUNTER — Other Ambulatory Visit: Payer: Self-pay | Admitting: Internal Medicine

## 2014-03-08 ENCOUNTER — Other Ambulatory Visit: Payer: Self-pay | Admitting: Dermatology

## 2014-03-08 DIAGNOSIS — N393 Stress incontinence (female) (male): Secondary | ICD-10-CM | POA: Diagnosis not present

## 2014-03-08 DIAGNOSIS — N8111 Cystocele, midline: Secondary | ICD-10-CM | POA: Diagnosis not present

## 2014-03-08 DIAGNOSIS — R300 Dysuria: Secondary | ICD-10-CM | POA: Diagnosis not present

## 2014-03-08 DIAGNOSIS — N390 Urinary tract infection, site not specified: Secondary | ICD-10-CM | POA: Diagnosis not present

## 2014-03-08 DIAGNOSIS — L821 Other seborrheic keratosis: Secondary | ICD-10-CM | POA: Diagnosis not present

## 2014-03-09 DIAGNOSIS — N390 Urinary tract infection, site not specified: Secondary | ICD-10-CM | POA: Diagnosis not present

## 2014-03-10 DIAGNOSIS — N390 Urinary tract infection, site not specified: Secondary | ICD-10-CM | POA: Diagnosis not present

## 2014-03-11 DIAGNOSIS — N390 Urinary tract infection, site not specified: Secondary | ICD-10-CM | POA: Diagnosis not present

## 2014-03-12 DIAGNOSIS — N390 Urinary tract infection, site not specified: Secondary | ICD-10-CM | POA: Diagnosis not present

## 2014-03-16 DIAGNOSIS — N3941 Urge incontinence: Secondary | ICD-10-CM | POA: Diagnosis not present

## 2014-03-16 DIAGNOSIS — N390 Urinary tract infection, site not specified: Secondary | ICD-10-CM | POA: Diagnosis not present

## 2014-03-19 ENCOUNTER — Other Ambulatory Visit: Payer: Self-pay | Admitting: Internal Medicine

## 2014-03-26 ENCOUNTER — Ambulatory Visit (INDEPENDENT_AMBULATORY_CARE_PROVIDER_SITE_OTHER): Payer: Medicare Other | Admitting: Internal Medicine

## 2014-03-26 ENCOUNTER — Other Ambulatory Visit (INDEPENDENT_AMBULATORY_CARE_PROVIDER_SITE_OTHER): Payer: Medicare Other

## 2014-03-26 ENCOUNTER — Encounter: Payer: Self-pay | Admitting: Internal Medicine

## 2014-03-26 VITALS — BP 132/70 | HR 92 | Temp 97.8°F | Wt 186.4 lb

## 2014-03-26 DIAGNOSIS — I10 Essential (primary) hypertension: Secondary | ICD-10-CM

## 2014-03-26 DIAGNOSIS — IMO0001 Reserved for inherently not codable concepts without codable children: Secondary | ICD-10-CM | POA: Diagnosis not present

## 2014-03-26 DIAGNOSIS — E559 Vitamin D deficiency, unspecified: Secondary | ICD-10-CM

## 2014-03-26 DIAGNOSIS — E1165 Type 2 diabetes mellitus with hyperglycemia: Principal | ICD-10-CM

## 2014-03-26 DIAGNOSIS — E782 Mixed hyperlipidemia: Secondary | ICD-10-CM | POA: Diagnosis not present

## 2014-03-26 DIAGNOSIS — H04129 Dry eye syndrome of unspecified lacrimal gland: Secondary | ICD-10-CM | POA: Diagnosis not present

## 2014-03-26 DIAGNOSIS — I6529 Occlusion and stenosis of unspecified carotid artery: Secondary | ICD-10-CM | POA: Diagnosis not present

## 2014-03-26 DIAGNOSIS — H2510 Age-related nuclear cataract, unspecified eye: Secondary | ICD-10-CM | POA: Diagnosis not present

## 2014-03-26 DIAGNOSIS — G479 Sleep disorder, unspecified: Secondary | ICD-10-CM

## 2014-03-26 LAB — BASIC METABOLIC PANEL
BUN: 13 mg/dL (ref 6–23)
CALCIUM: 9.4 mg/dL (ref 8.4–10.5)
CO2: 24 mEq/L (ref 19–32)
CREATININE: 0.7 mg/dL (ref 0.4–1.2)
Chloride: 103 mEq/L (ref 96–112)
GFR: 92.64 mL/min (ref >60.00)
Glucose, Bld: 289 mg/dL — ABNORMAL HIGH (ref 70–99)
Potassium: 4 mEq/L (ref 3.5–5.1)
Sodium: 135 mEq/L (ref 135–145)

## 2014-03-26 LAB — HEMOGLOBIN A1C: Hgb A1c MFr Bld: 6.8 % — ABNORMAL HIGH (ref 4.6–6.5)

## 2014-03-26 LAB — TSH: TSH: 2.31 u[IU]/mL (ref 0.35–4.50)

## 2014-03-26 NOTE — Patient Instructions (Addendum)
Your next office appointment will be determined based upon review of your pending labs . Those instructions will be transmitted to you  by mail.  To prevent sleep dysfunction follow these instructions for sleep hygiene. Do not read, watch TV, or eat in bed. Do not get into bed until you are ready to turn off the light &  to go to sleep. Do not ingest stimulants ( decongestants, diet pills, nicotine, caffeine) after the evening meal.Do not take daytime naps.Cardiovascular exercise, this can be as simple a program as walking, is recommended 30-45 minutes 3-4 times per week. If you're not exercising you should take 6-8 weeks to build up to this level. Clonazepam every 3rd night as needed.

## 2014-03-26 NOTE — Progress Notes (Signed)
Pre visit review using our clinic review tool, if applicable. No additional management support is needed unless otherwise documented below in the visit note. 

## 2014-03-26 NOTE — Progress Notes (Signed)
   Subjective:    Patient ID: [name], female    DOB: [birth year], 73 y.o.   MRN: [id number]  HPI    She is here for followup of her hypertension, diabetes, and dyslipidemia. She'll need refills of medications as appropriate.  She does not monitor blood pressure at home but has been compliant with her medicines. She denies any adverse effects such as cough. She denies any active cardiovascular symptoms  Her last BMET was 10/15/13.  Her blood sugars have been variable with fasting of 108-201. They were much higher when she was being treated for an active urinary tract infection with parenteral antibiotics.  She does get sweaty with excess exercise and has had a low glucose of 70.  Ophthalmologic exam is today. She has not had a podiatry evaluation  She has tingling in the right upper extremity but this is related to her previous history of stroke. She is followed by a neurologist with annual carotid Dopplers .  Her last A1c was 7.4% on 02/10/14.  She denies any active neurologic symptoms at this time; she was placed on Myrbetriq by the urologist prophylactically.  She has been compliant with her statin. She denies any adverse symptoms such as memory loss or myalgias. She has no active GI symptoms  Her lipids were at goal 02/02/14. TSH is overdue.      Review of Systems   The citalopram is of questionable effectiveness. She has no significant neuropsychiatric symptoms at this time.  She takes clonazepam at bedtime for sleep. She finds that it is needed if she misses it more than 2 days in row.     Objective:   Physical Exam   Positive or pertinent findings include: She appears much younger than her stated age. There is slight decreased range of motion of cervical spine. Grade one half to 1 systolic murmur. Fusiform changes of the knees with crepitus.   General appearance :adequately nourished; in no distress. Eyes: No conjunctival inflammation or scleral icterus is  present. Oral exam: Dental hygiene is good. Lips and gums are healthy appearing.There is no oropharyngeal erythema or exudate noted.  Heart:  Normal rate and regular rhythm. S1 and S2 normal without gallop, click, rub or other extra sounds   Lungs:Chest clear to auscultation; no wheezes, rhonchi,rales ,or rubs present.No increased work of breathing.  Abdomen: bowel sounds normal, soft and non-tender without masses, organomegaly or hernias noted.  No guarding or rebound. No flank tenderness to percussion. Skin:Warm & dry.  Intact without suspicious lesions or rashes ; no jaundice or tenting Lymphatic: No lymphadenopathy is noted about the head, neck, axilla             Assessment & Plan:  See Current Assessment & Plan in Problem List under specific Diagnosis  She will be given the option of taking one half citalopram a day to see if she does need 20 mg dose.  She's been asked to practice sleep hygiene and take the clonazepam every third night as needed only. The potential risks of this medication were discussed.

## 2014-03-27 NOTE — Assessment & Plan Note (Signed)
No change; A1c every 4 mos

## 2014-03-27 NOTE — Assessment & Plan Note (Signed)
Vitamin D level 

## 2014-03-27 NOTE — Assessment & Plan Note (Signed)
At goal no change 

## 2014-03-27 NOTE — Assessment & Plan Note (Signed)
Blood pressure goals reviewed. BMET 

## 2014-03-30 ENCOUNTER — Other Ambulatory Visit: Payer: Self-pay

## 2014-03-30 LAB — VITAMIN D 25 HYDROXY (VIT D DEFICIENCY, FRACTURES): VITD: 43.6 ng/mL (ref 30.00–100.00)

## 2014-03-30 MED ORDER — METFORMIN HCL 1000 MG PO TABS: ORAL_TABLET | ORAL | Status: DC

## 2014-03-30 MED ORDER — GLUCOSE BLOOD VI STRP: ORAL_STRIP | Status: DC

## 2014-03-30 MED ORDER — BENAZEPRIL HCL 20 MG PO TABS: ORAL_TABLET | ORAL | Status: DC

## 2014-03-30 MED ORDER — CLONAZEPAM 0.5 MG PO TABS: 0.5000 mg | ORAL_TABLET | Freq: Every day | ORAL | Status: DC

## 2014-03-30 NOTE — Telephone Encounter (Signed)
Script for Clonazepam has been faxed to [facility]

## 2014-04-08 DIAGNOSIS — N390 Urinary tract infection, site not specified: Secondary | ICD-10-CM | POA: Diagnosis not present

## 2014-04-08 DIAGNOSIS — R82998 Other abnormal findings in urine: Secondary | ICD-10-CM | POA: Diagnosis not present

## 2014-04-08 DIAGNOSIS — R300 Dysuria: Secondary | ICD-10-CM | POA: Diagnosis not present

## 2014-04-09 ENCOUNTER — Telehealth: Payer: Self-pay | Admitting: Internal Medicine

## 2014-04-09 MED ORDER — CLONAZEPAM 0.5 MG PO TABS: 0.5000 mg | ORAL_TABLET | Freq: Every day | ORAL | Status: DC

## 2014-04-09 NOTE — Telephone Encounter (Signed)
[facility] said [facility] that [facility] could not fill the clonazePAM (KLONOPIN) 0.5 MG tablet [[id number].Marland Kitchen  She is out of this med and [facility] [address].      Thank you!!

## 2014-04-09 NOTE — Telephone Encounter (Signed)
[facility] spoke [facility] Kimber/pharmacist gave md authorization. Notified pt rx call into pharmacy...[name]

## 2014-04-09 NOTE — Telephone Encounter (Signed)
#  30 1/2-1 qhs prn

## 2014-04-27 DIAGNOSIS — N393 Stress incontinence (female) (male): Secondary | ICD-10-CM | POA: Diagnosis not present

## 2014-04-27 DIAGNOSIS — N390 Urinary tract infection, site not specified: Secondary | ICD-10-CM | POA: Diagnosis not present

## 2014-05-07 ENCOUNTER — Other Ambulatory Visit: Payer: Self-pay

## 2014-05-07 MED ORDER — CLONAZEPAM 0.5 MG PO TABS: ORAL_TABLET | ORAL | Status: DC

## 2014-05-07 NOTE — Telephone Encounter (Signed)
#  30 PRN ONLY , not routinely

## 2014-05-07 NOTE — Telephone Encounter (Signed)
Clonazepam has been called to pharmacy  

## 2014-06-07 DIAGNOSIS — N393 Stress incontinence (female) (male): Secondary | ICD-10-CM | POA: Diagnosis not present

## 2014-06-07 DIAGNOSIS — N321 Vesicointestinal fistula: Secondary | ICD-10-CM | POA: Diagnosis not present

## 2014-06-11 DIAGNOSIS — N321 Vesicointestinal fistula: Secondary | ICD-10-CM | POA: Diagnosis not present

## 2014-06-14 ENCOUNTER — Other Ambulatory Visit (INDEPENDENT_AMBULATORY_CARE_PROVIDER_SITE_OTHER): Payer: Medicare Other

## 2014-06-14 ENCOUNTER — Other Ambulatory Visit: Payer: Self-pay | Admitting: Internal Medicine

## 2014-06-14 DIAGNOSIS — E1165 Type 2 diabetes mellitus with hyperglycemia: Secondary | ICD-10-CM

## 2014-06-14 DIAGNOSIS — IMO0002 Reserved for concepts with insufficient information to code with codable children: Secondary | ICD-10-CM

## 2014-06-14 LAB — HEMOGLOBIN A1C: HEMOGLOBIN A1C: 7.1 % — AB (ref 4.6–6.5)

## 2014-06-15 ENCOUNTER — Other Ambulatory Visit: Payer: Self-pay

## 2014-06-15 NOTE — Telephone Encounter (Signed)
OK X1 

## 2014-06-16 ENCOUNTER — Other Ambulatory Visit: Payer: Self-pay | Admitting: Internal Medicine

## 2014-06-16 DIAGNOSIS — E1365 Other specified diabetes mellitus with hyperglycemia: Principal | ICD-10-CM

## 2014-06-16 DIAGNOSIS — E1329 Other specified diabetes mellitus with other diabetic kidney complication: Secondary | ICD-10-CM

## 2014-06-16 DIAGNOSIS — IMO0002 Reserved for concepts with insufficient information to code with codable children: Secondary | ICD-10-CM

## 2014-06-16 MED ORDER — CLONAZEPAM 0.5 MG PO TABS: ORAL_TABLET | ORAL | Status: DC

## 2014-06-16 NOTE — Telephone Encounter (Signed)
[facility]  

## 2014-07-19 ENCOUNTER — Other Ambulatory Visit: Payer: Self-pay | Admitting: Internal Medicine

## 2014-07-26 ENCOUNTER — Other Ambulatory Visit: Payer: Self-pay | Admitting: Internal Medicine

## 2014-07-26 NOTE — Telephone Encounter (Signed)
OK X1 

## 2014-07-26 NOTE — Telephone Encounter (Signed)
[facility] [address]

## 2014-07-31 ENCOUNTER — Ambulatory Visit (INDEPENDENT_AMBULATORY_CARE_PROVIDER_SITE_OTHER): Payer: Medicare Other | Admitting: Family Medicine

## 2014-07-31 VITALS — BP 122/70 | HR 71 | Temp 97.8°F | Resp 18 | Ht 66.75 in | Wt 191.2 lb

## 2014-07-31 DIAGNOSIS — H60391 Other infective otitis externa, right ear: Secondary | ICD-10-CM | POA: Diagnosis not present

## 2014-07-31 MED ORDER — AMOXICILLIN-POT CLAVULANATE 875-125 MG PO TABS: 1.0000 | ORAL_TABLET | Freq: Two times a day (BID) | ORAL | Status: DC

## 2014-07-31 MED ORDER — CIPROFLOXACIN-HYDROCORTISONE 0.2-1 % OT SUSP: OTIC | Status: DC

## 2014-07-31 NOTE — Patient Instructions (Signed)
Take the Augmentin 1 pill twice daily at breakfast and supper for antibiotic  Take Tylenol or ibuprofen as necessary for pain  Use the eardrops 3-5 drops in the right ear while laying down twice daily  Return on Monday for me to remove the wick and decide on further treatment if needed. Bring your eardrops with you.

## 2014-07-31 NOTE — Progress Notes (Signed)
Subjective: For at least a week the patient's been having problems with her right ear bothering her. She has had pain. She is aware of swelling of the earlobe and the canal. She does do some water aerobics, but did not do them over Christmas. She started back Wednesday 3 days ago but that was aftebut did not do them over Christmas. She started back Wednesday 3 days ago but that was after she already had the infection coming on..     Objective: Healthy-appearing lady. Left TM and ear are entirely normal. Right TM appears normal but the ear canal is swollen Tessalon is very very narrow, red and inflamed. The inner portion of the earlobe itself is also red and swollen and puffy.  Assessment: Otitis externa, infected  Plan: Ear wick insertion  Procedure Note A wick was inserted and Cipro otic HC drops placed. The patient tolerated the procedure well.  Instructions:  Cipro otic HC twice daily  Augmentin 875 twice daily return if worse

## 2014-08-02 ENCOUNTER — Ambulatory Visit (INDEPENDENT_AMBULATORY_CARE_PROVIDER_SITE_OTHER): Payer: Medicare Other | Admitting: Family Medicine

## 2014-08-02 VITALS — BP 126/70 | HR 64 | Temp 98.2°F | Resp 16 | Ht 66.0 in | Wt 190.0 lb

## 2014-08-02 DIAGNOSIS — H6091 Unspecified otitis externa, right ear: Secondary | ICD-10-CM

## 2014-08-02 NOTE — Progress Notes (Signed)
Subjective: Ear feels better. It is still a little tender when she touches in front of the ear.  Objective: The redness in the pinna of the ear is not nearly as pronounced, and it is not as puffy as it was. With was removed. The ear canal is much less swollen. There is a small ecchymosis on the anterior part of the canal just anterior to the drum. Almost a little blood blister like place in there. It should gradually resolve with time. The drum has a little mucus-like discharge on it. No perforations noted. Contours look satisfactory. No major nodes  Assessment: Otitis externa, significantly improved  Plan: Continue using the drops for a few more days. Return if he gets further problems.

## 2014-08-02 NOTE — Patient Instructions (Signed)
Return if problems.   Continue using the ear drops.

## 2014-08-06 DIAGNOSIS — Z1231 Encounter for screening mammogram for malignant neoplasm of breast: Secondary | ICD-10-CM | POA: Diagnosis not present

## 2014-08-10 ENCOUNTER — Other Ambulatory Visit: Payer: Self-pay | Admitting: Internal Medicine

## 2014-08-18 ENCOUNTER — Encounter: Payer: Self-pay | Admitting: Internal Medicine

## 2014-08-22 ENCOUNTER — Other Ambulatory Visit: Payer: Self-pay | Admitting: Internal Medicine

## 2014-08-25 DIAGNOSIS — N393 Stress incontinence (female) (male): Secondary | ICD-10-CM | POA: Diagnosis not present

## 2014-08-25 DIAGNOSIS — N8110 Cystocele, unspecified: Secondary | ICD-10-CM | POA: Diagnosis not present

## 2014-08-27 ENCOUNTER — Other Ambulatory Visit: Payer: Self-pay | Admitting: Internal Medicine

## 2014-08-27 NOTE — Telephone Encounter (Signed)
[facility] [address]

## 2014-08-27 NOTE — Telephone Encounter (Signed)
OK X1 

## 2014-09-01 DIAGNOSIS — R9431 Abnormal electrocardiogram [ECG] [EKG]: Secondary | ICD-10-CM | POA: Diagnosis not present

## 2014-09-01 DIAGNOSIS — Z8679 Personal history of other diseases of the circulatory system: Secondary | ICD-10-CM | POA: Diagnosis not present

## 2014-09-01 DIAGNOSIS — E119 Type 2 diabetes mellitus without complications: Secondary | ICD-10-CM | POA: Diagnosis not present

## 2014-09-01 DIAGNOSIS — Z01810 Encounter for preprocedural cardiovascular examination: Secondary | ICD-10-CM | POA: Diagnosis not present

## 2014-09-07 DIAGNOSIS — I2510 Atherosclerotic heart disease of native coronary artery without angina pectoris: Secondary | ICD-10-CM | POA: Diagnosis not present

## 2014-09-07 DIAGNOSIS — Z8673 Personal history of transient ischemic attack (TIA), and cerebral infarction without residual deficits: Secondary | ICD-10-CM | POA: Diagnosis not present

## 2014-09-07 DIAGNOSIS — F419 Anxiety disorder, unspecified: Secondary | ICD-10-CM | POA: Diagnosis not present

## 2014-09-07 DIAGNOSIS — I10 Essential (primary) hypertension: Secondary | ICD-10-CM | POA: Diagnosis not present

## 2014-09-07 DIAGNOSIS — R5383 Other fatigue: Secondary | ICD-10-CM | POA: Diagnosis not present

## 2014-09-07 DIAGNOSIS — R300 Dysuria: Secondary | ICD-10-CM | POA: Diagnosis not present

## 2014-09-07 DIAGNOSIS — M1990 Unspecified osteoarthritis, unspecified site: Secondary | ICD-10-CM | POA: Diagnosis not present

## 2014-09-07 DIAGNOSIS — E119 Type 2 diabetes mellitus without complications: Secondary | ICD-10-CM | POA: Diagnosis not present

## 2014-09-07 DIAGNOSIS — N816 Rectocele: Secondary | ICD-10-CM | POA: Diagnosis not present

## 2014-09-07 DIAGNOSIS — M810 Age-related osteoporosis without current pathological fracture: Secondary | ICD-10-CM | POA: Diagnosis not present

## 2014-09-07 DIAGNOSIS — F329 Major depressive disorder, single episode, unspecified: Secondary | ICD-10-CM | POA: Diagnosis not present

## 2014-09-07 DIAGNOSIS — N393 Stress incontinence (female) (male): Secondary | ICD-10-CM | POA: Diagnosis not present

## 2014-09-07 DIAGNOSIS — E041 Nontoxic single thyroid nodule: Secondary | ICD-10-CM | POA: Diagnosis not present

## 2014-09-07 DIAGNOSIS — N8110 Cystocele, unspecified: Secondary | ICD-10-CM | POA: Diagnosis not present

## 2014-09-07 DIAGNOSIS — E782 Mixed hyperlipidemia: Secondary | ICD-10-CM | POA: Diagnosis not present

## 2014-09-07 DIAGNOSIS — Z86718 Personal history of other venous thrombosis and embolism: Secondary | ICD-10-CM | POA: Diagnosis not present

## 2014-09-07 DIAGNOSIS — E559 Vitamin D deficiency, unspecified: Secondary | ICD-10-CM | POA: Diagnosis not present

## 2014-09-08 DIAGNOSIS — F419 Anxiety disorder, unspecified: Secondary | ICD-10-CM | POA: Diagnosis not present

## 2014-09-08 DIAGNOSIS — Z86718 Personal history of other venous thrombosis and embolism: Secondary | ICD-10-CM | POA: Diagnosis not present

## 2014-09-08 DIAGNOSIS — N816 Rectocele: Secondary | ICD-10-CM | POA: Diagnosis not present

## 2014-09-08 DIAGNOSIS — N8110 Cystocele, unspecified: Secondary | ICD-10-CM | POA: Diagnosis not present

## 2014-09-08 DIAGNOSIS — N393 Stress incontinence (female) (male): Secondary | ICD-10-CM | POA: Diagnosis not present

## 2014-09-08 DIAGNOSIS — I2510 Atherosclerotic heart disease of native coronary artery without angina pectoris: Secondary | ICD-10-CM | POA: Diagnosis not present

## 2014-09-11 ENCOUNTER — Other Ambulatory Visit: Payer: Self-pay | Admitting: Internal Medicine

## 2014-09-20 ENCOUNTER — Other Ambulatory Visit: Payer: Self-pay | Admitting: Internal Medicine

## 2014-09-20 NOTE — Telephone Encounter (Signed)
Ok X 3 mos

## 2014-09-20 NOTE — Telephone Encounter (Signed)
03/26/14 patient last seen with you 11/25/13 med last sent to [id number] with 2 refills

## 2014-09-22 DIAGNOSIS — N393 Stress incontinence (female) (male): Secondary | ICD-10-CM | POA: Diagnosis not present

## 2014-09-29 ENCOUNTER — Other Ambulatory Visit: Payer: Self-pay

## 2014-09-29 MED ORDER — CLONAZEPAM 0.5 MG PO TABS: ORAL_TABLET | ORAL | Status: DC

## 2014-09-29 NOTE — Telephone Encounter (Signed)
OK X1 

## 2014-09-29 NOTE — Telephone Encounter (Signed)
Called refill into pharmacy spoke with Ray/pharmacist gave md approval.../lmb

## 2014-11-01 ENCOUNTER — Other Ambulatory Visit: Payer: Self-pay | Admitting: Internal Medicine

## 2014-11-01 NOTE — Telephone Encounter (Signed)
OK x 1 

## 2014-11-01 NOTE — Telephone Encounter (Signed)
[facility]  

## 2014-11-03 DIAGNOSIS — H04123 Dry eye syndrome of bilateral lacrimal glands: Secondary | ICD-10-CM | POA: Diagnosis not present

## 2014-11-03 DIAGNOSIS — E119 Type 2 diabetes mellitus without complications: Secondary | ICD-10-CM | POA: Diagnosis not present

## 2014-11-03 DIAGNOSIS — H2513 Age-related nuclear cataract, bilateral: Secondary | ICD-10-CM | POA: Diagnosis not present

## 2014-11-03 DIAGNOSIS — H25013 Cortical age-related cataract, bilateral: Secondary | ICD-10-CM | POA: Diagnosis not present

## 2014-11-03 DIAGNOSIS — H35371 Puckering of macula, right eye: Secondary | ICD-10-CM | POA: Diagnosis not present

## 2014-11-24 DIAGNOSIS — H35371 Puckering of macula, right eye: Secondary | ICD-10-CM | POA: Diagnosis not present

## 2014-11-24 DIAGNOSIS — H35341 Macular cyst, hole, or pseudohole, right eye: Secondary | ICD-10-CM | POA: Diagnosis not present

## 2014-11-24 DIAGNOSIS — H43811 Vitreous degeneration, right eye: Secondary | ICD-10-CM | POA: Diagnosis not present

## 2014-11-29 ENCOUNTER — Other Ambulatory Visit: Payer: Self-pay | Admitting: Internal Medicine

## 2014-11-29 NOTE — Telephone Encounter (Signed)
OK X1 

## 2014-11-30 NOTE — Telephone Encounter (Signed)
[facility] [address]

## 2014-12-06 ENCOUNTER — Ambulatory Visit (INDEPENDENT_AMBULATORY_CARE_PROVIDER_SITE_OTHER): Payer: Medicare Other | Admitting: Internal Medicine

## 2014-12-06 VITALS — HR 62 | Temp 97.5°F | Resp 14 | Ht 66.0 in | Wt 193.0 lb

## 2014-12-06 DIAGNOSIS — T161XXA Foreign body in right ear, initial encounter: Secondary | ICD-10-CM | POA: Diagnosis not present

## 2014-12-06 NOTE — Progress Notes (Signed)
   Subjective:    Patient ID: [name], female    DOB: Feb 03, 1941, 74 y.o.   MRN: [id number]  HPI Right ear is stopped up, has been using qtips to clean. Going on a long time. No other trauma.   Review of Systems     Objective:   Physical Exam  Constitutional: She is oriented to person, place, and time. She appears well-developed and well-nourished. No distress.  HENT:  Head: Normocephalic.  Right Ear: No drainage, swelling or tenderness. A foreign body is present. No mastoid tenderness. No middle ear effusion. Decreased hearing is noted.  Left Ear: Hearing, tympanic membrane, external ear and ear canal normal.  Nose: Nose normal.  Mouth/Throat: Oropharynx is clear and moist.  Eyes: EOM are normal.  Neck: Normal range of motion.  Pulmonary/Chest: Effort normal.  Neurological: She is alert and oriented to person, place, and time. She exhibits normal muscle tone. Coordination normal.  Skin: No rash noted.  Psychiatric: She has a normal mood and affect. Her behavior is normal.    Right canal has FB on TM, appears to be cotton swab packed onto TM  Will try and irrigate--large amt of cotton removed and much persists. Refer to ENT prn      Assessment & Plan:  Refer to ENT

## 2014-12-06 NOTE — Patient Instructions (Signed)
Cerumen Impaction °A cerumen impaction is when the wax in your ear forms a plug. This plug usually causes reduced hearing. Sometimes it also causes an earache or dizziness. Removing a cerumen impaction can be difficult and painful. The wax sticks to the ear canal. The canal is sensitive and bleeds easily. If you try to remove a heavy wax buildup with a cotton tipped swab, you may push it in further. °Irrigation with water, suction, and small ear curettes may be used to clear out the wax. If the impaction is fixed to the skin in the ear canal, ear drops may be needed for a few days to loosen the wax. People who build up a lot of wax frequently can use ear wax removal products available in your local drugstore. °SEEK MEDICAL CARE IF:  °You develop an earache, increased hearing loss, or marked dizziness. °Document Released: 08/16/2004 Document Revised: 10/01/2011 Document Reviewed: 10/06/2009 °[employer]® Patient Information ©2015 [employer]. This information is not intended to replace advice given to you by your health care provider. Make sure you discuss any questions you have with your health care provider. ° °

## 2014-12-07 ENCOUNTER — Telehealth: Payer: Self-pay | Admitting: *Deleted

## 2014-12-07 DIAGNOSIS — H6061 Unspecified chronic otitis externa, right ear: Secondary | ICD-10-CM | POA: Diagnosis not present

## 2014-12-07 DIAGNOSIS — R51 Headache: Secondary | ICD-10-CM | POA: Diagnosis not present

## 2014-12-07 DIAGNOSIS — R0683 Snoring: Secondary | ICD-10-CM | POA: Diagnosis not present

## 2014-12-07 DIAGNOSIS — R0981 Nasal congestion: Secondary | ICD-10-CM | POA: Diagnosis not present

## 2014-12-07 NOTE — Telephone Encounter (Signed)
Faxed signed order for Bone Density With IVA if necessary, per Dr Everlene Farrier. Confirmation page received at 12:26 pm.

## 2014-12-09 DIAGNOSIS — H04123 Dry eye syndrome of bilateral lacrimal glands: Secondary | ICD-10-CM | POA: Diagnosis not present

## 2014-12-09 DIAGNOSIS — H35373 Puckering of macula, bilateral: Secondary | ICD-10-CM | POA: Diagnosis not present

## 2014-12-17 ENCOUNTER — Other Ambulatory Visit: Payer: Self-pay | Admitting: Internal Medicine

## 2014-12-17 NOTE — Telephone Encounter (Signed)
OK X 1 ; needs OV

## 2014-12-17 NOTE — Telephone Encounter (Signed)
celexa rx sent to pharm 

## 2014-12-17 NOTE — Telephone Encounter (Signed)
Patients last office visit sept, 2015---are you ok with refilling----please advise, thanks

## 2014-12-21 DIAGNOSIS — N390 Urinary tract infection, site not specified: Secondary | ICD-10-CM | POA: Diagnosis not present

## 2014-12-21 DIAGNOSIS — R300 Dysuria: Secondary | ICD-10-CM | POA: Diagnosis not present

## 2014-12-21 DIAGNOSIS — R350 Frequency of micturition: Secondary | ICD-10-CM | POA: Diagnosis not present

## 2014-12-26 ENCOUNTER — Other Ambulatory Visit: Payer: Self-pay | Admitting: Internal Medicine

## 2014-12-26 ENCOUNTER — Telehealth: Payer: Self-pay | Admitting: Neurology

## 2014-12-26 NOTE — Telephone Encounter (Signed)
I called the patient. I left a message. The patient is to call if she is amenable to getting a follow up carotid doppler. Prior study shows bilateral stenosis, 50-69%.

## 2014-12-26 NOTE — Telephone Encounter (Signed)
-----   Message from Kathrynn Ducking, MD sent at 12/22/2013  8:14 AM EDT ----- Recheck carotid Doppler study, bilateral stenosis seen previously in the internal carotid arteries bilaterally, 50-69%.

## 2014-12-28 ENCOUNTER — Telehealth: Payer: Self-pay | Admitting: Neurology

## 2014-12-28 DIAGNOSIS — I6529 Occlusion and stenosis of unspecified carotid artery: Secondary | ICD-10-CM

## 2014-12-28 NOTE — Telephone Encounter (Signed)
patient lvm to talk about [place]. please advise patient on getting the ultrasound discussed with Dr. Jannifer Franklin

## 2014-12-29 NOTE — Telephone Encounter (Signed)
I will reorder 

## 2014-12-29 NOTE — Telephone Encounter (Signed)
I called the patient. She would like to have the carotid doppler done. She was calling to schedule an appointment for it. I told her someone who call her back to schedule it once the order was placed.

## 2015-01-06 ENCOUNTER — Other Ambulatory Visit: Payer: Self-pay | Admitting: Emergency Medicine

## 2015-01-06 ENCOUNTER — Telehealth: Payer: Self-pay

## 2015-01-06 ENCOUNTER — Telehealth: Payer: Self-pay | Admitting: Internal Medicine

## 2015-01-06 IMAGING — CT CT ABD-PELV W/ CM
2 of 5 series · 17 of 46 positions shown, 19 images · IV contrast (omnipaque)
Comparison: 09/25/2012

CLINICAL DATA: Colovesical fistula.

CT ABDOMEN AND PELVIS WITH CONTRAST
TECHNIQUE: Multidetector CT imaging of the abdomen and pelvis was
performed following the standard protocol during bolus
administration of intravenous contrast.
Contrast: 100mL OMNIPAQUE IOHEXOL 300 MG/ML  SOLN

[Series 2: rtn a/p with · axial · 0.83mm/px · z∈[-729,-309]mm · 14 of 94 slices shown, 16 images]
[im 5/94  soft-tissue]
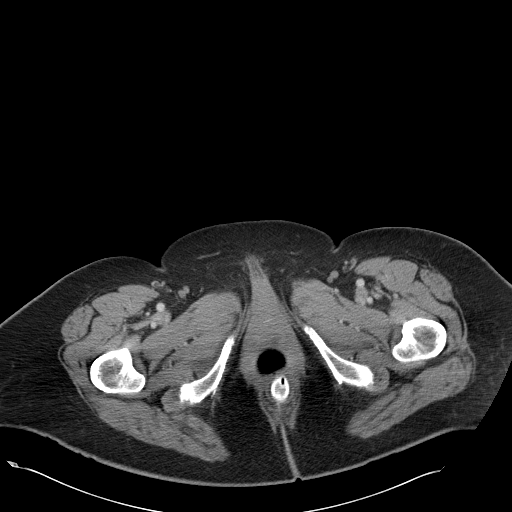
[im 5/94  bone]
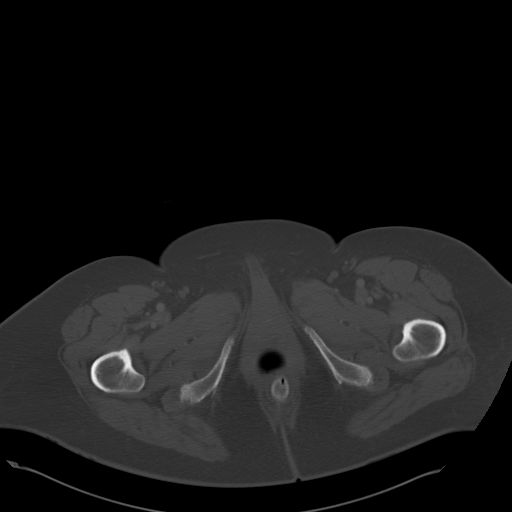
[im 10/94  soft-tissue]
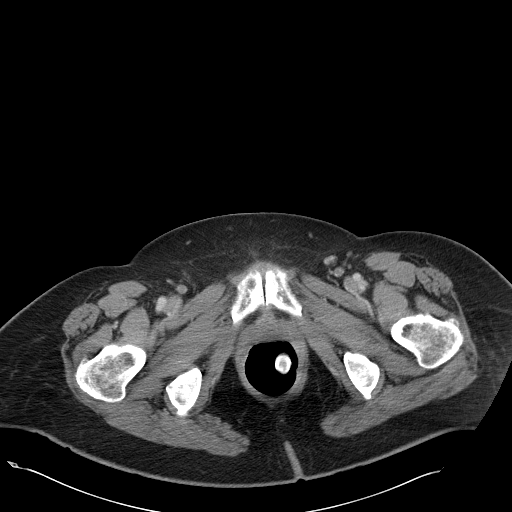
[im 20/94  soft-tissue]
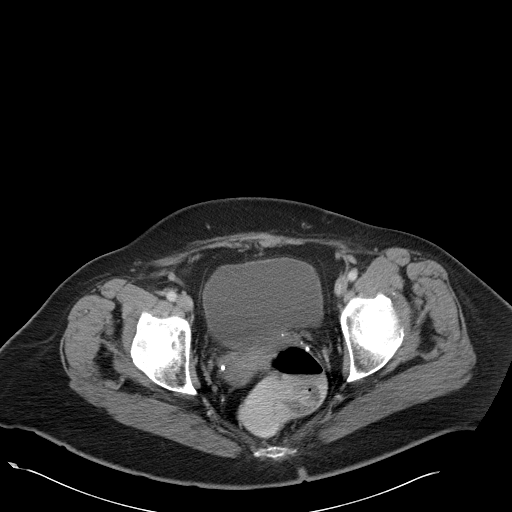
[im 25/94  soft-tissue]
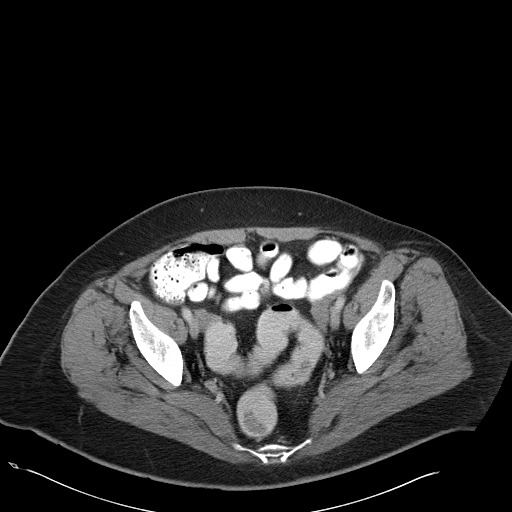
[im 30/94  soft-tissue]
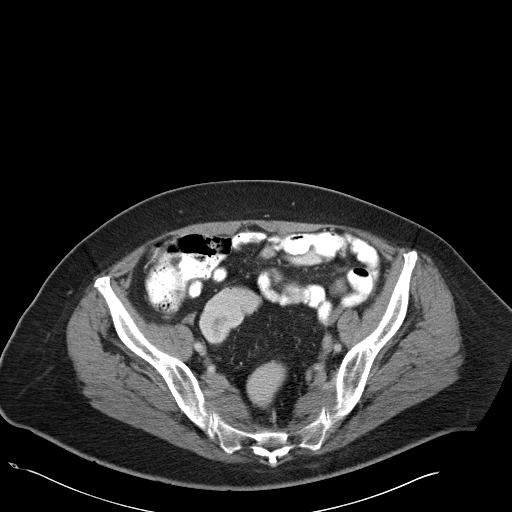
[im 40/94  soft-tissue]
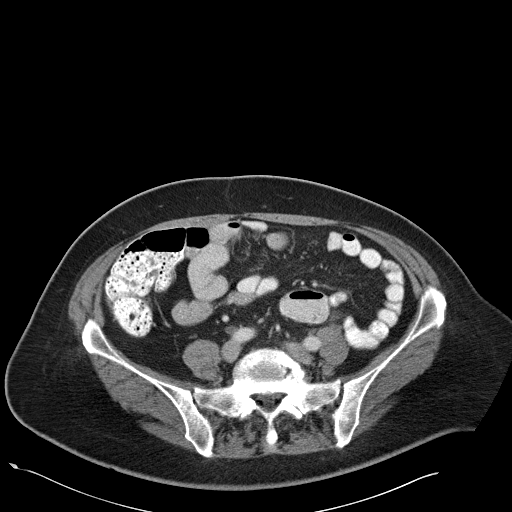
[im 45/94  soft-tissue]
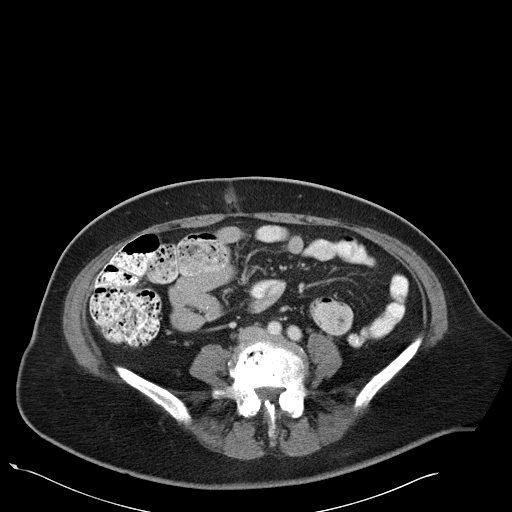
[im 49/94  soft-tissue]
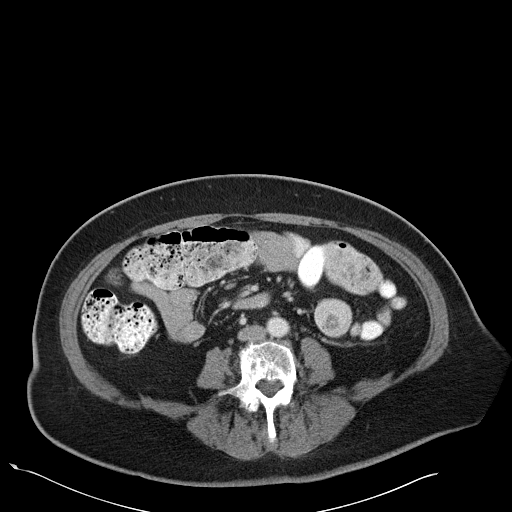
[im 54/94  soft-tissue]
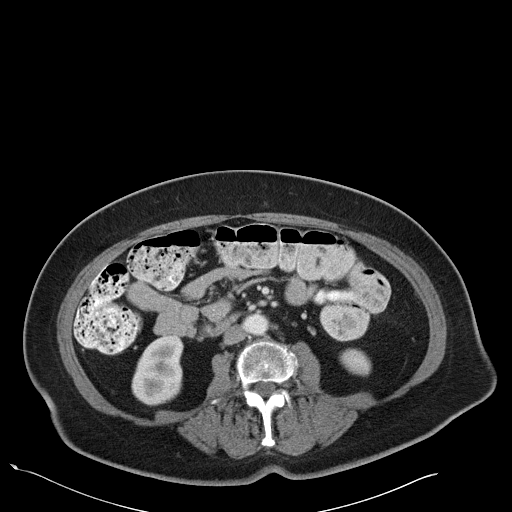
[im 54/94  bone]
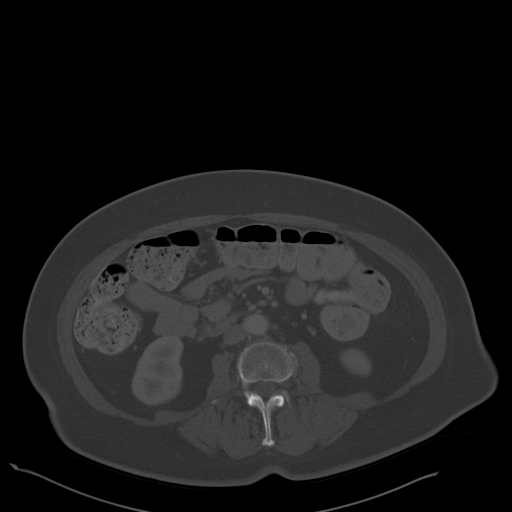
[im 64/94  soft-tissue]
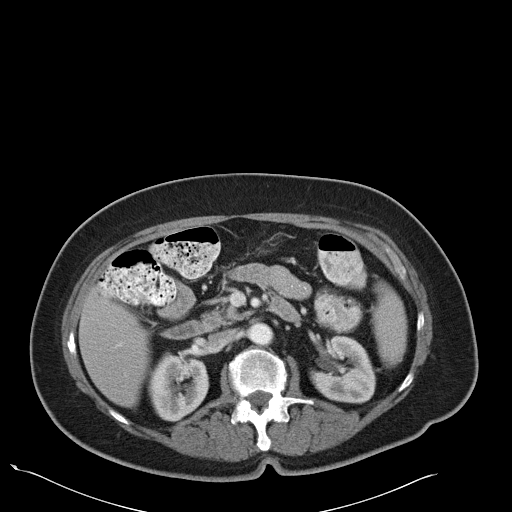
[im 69/94  soft-tissue]
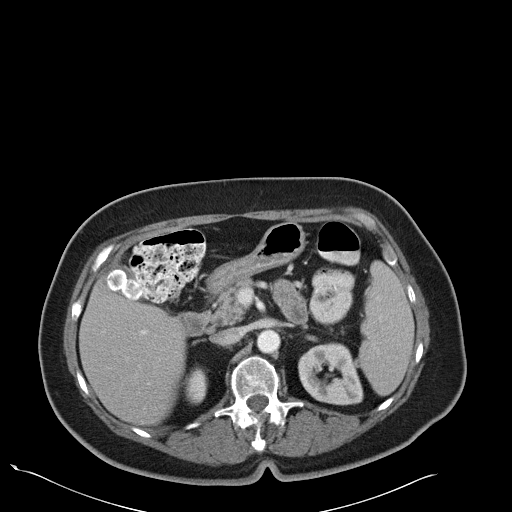
[im 74/94  soft-tissue]
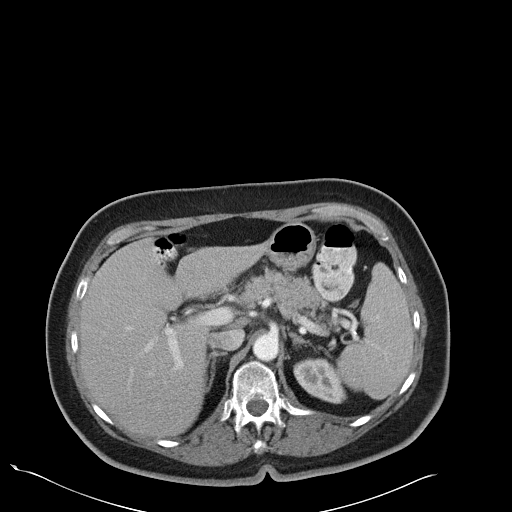
[im 84/94  soft-tissue]
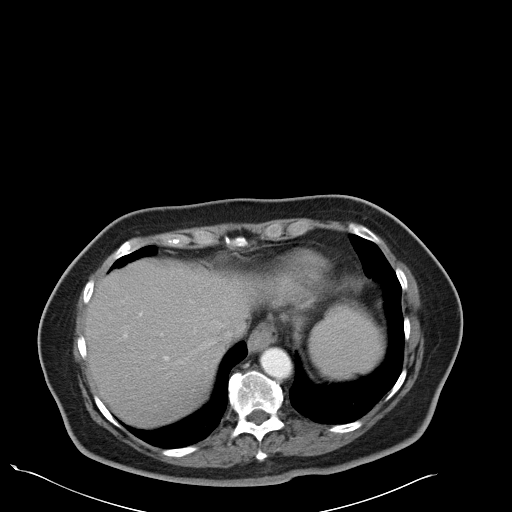
[im 89/94  soft-tissue]
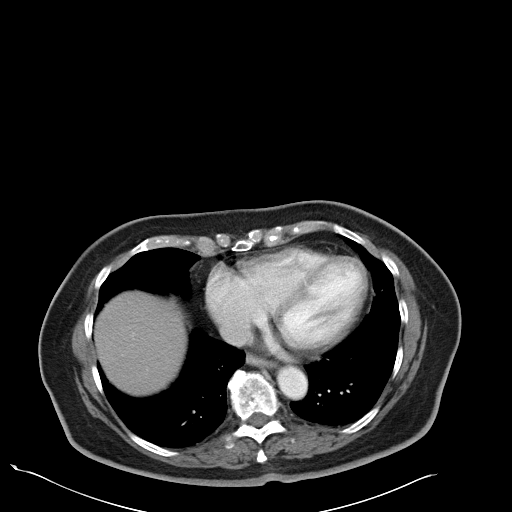

[Series 603: cor · coronal · 0.91mm/px · 3 of 126 slices shown]
[im 42/126  soft-tissue]
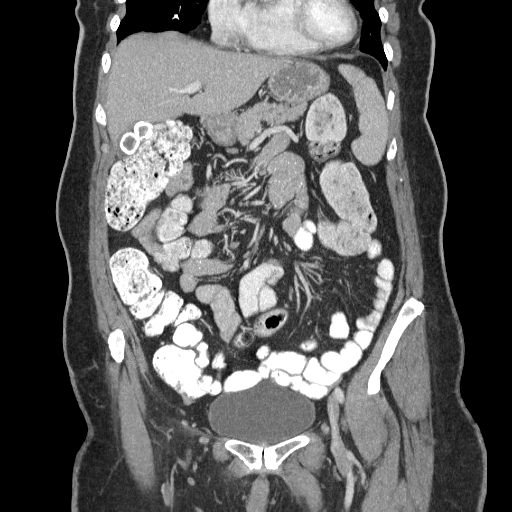
[im 56/126  soft-tissue]
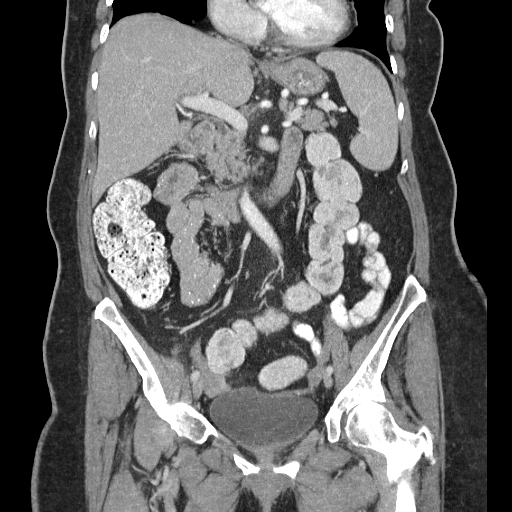
[im 70/126  soft-tissue]
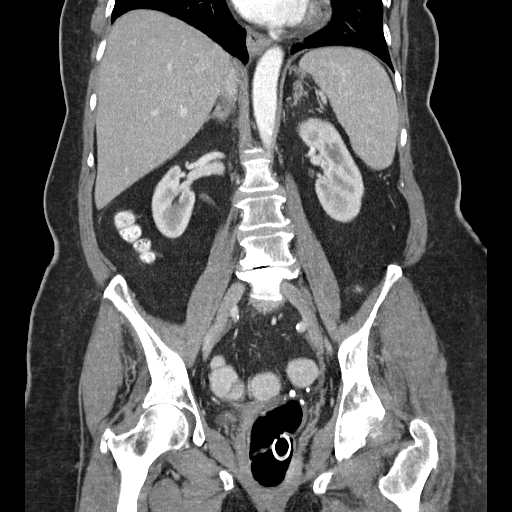

[17 of 46 positions shown; findings below may reference images not displayed]

FINDINGS: Small hiatal hernia. The liver, spleen, pancreas, and
adrenal glands appear unremarkable.

Two gallstones are present in the gallbladder, each about 2 cm in
diameter.  Gallbladder contracted.

No pathologic retroperitoneal or porta hepatis adenopathy is
identified.

Rectal tube noted with contrast in the colon.  No contrast is
observed in the vagina or urinary bladder on the portal venous
phase images.  However, the posterior bladder wall appears
retracted posteriorly, likely due to spurring, with stranding
extending from the posterior bladder wall to the distal sigmoid
colon on images 92-102 of series 602.

The kidneys appear unremarkable, as do the proximal ureters.

Uterus absent.  Adnexa unremarkable. No pathologic pelvic
adenopathy is identified.

Degenerative endplate sclerosis noted at L4-5 with grade 1
anterolisthesis at L4-5, and suspected foraminal stenosis at L4-5
due to disc bulge and facet arthropathy.
IMPRESSION: 1.  There is scarring along the posterior urinary bladder wall
tacking the bladder wall back towards the colon.  This scarring
does not appear to extend to the distal sigmoid colon.  However,
none of the contrast instilled into the colon appears to extend
into the urinary bladder.  No contrast extends into the vagina.
2.  Cholelithiasis.
3.  Small hiatal hernia.
4.  Degenerative disc disease and spondylosis at L4-5 causing
bilateral foraminal stenosis.

## 2015-01-06 MED ORDER — CLONAZEPAM 0.5 MG PO TABS: 0.5000 mg | ORAL_TABLET | ORAL | Status: DC | PRN

## 2015-01-06 NOTE — Telephone Encounter (Signed)
Please advise, thanks.

## 2015-01-06 NOTE — Telephone Encounter (Signed)
[facility] [address]

## 2015-01-06 NOTE — Telephone Encounter (Signed)
Call to Mrs. Colunga to discuss AWV as apt was made for AWV prior to her visit with Dr. Linna Darner. Explained AWV and also that since she has Part A / Part B, could not confirm there may be copay. The patient states she has [employer]are and does not feel like there will be an issue and would like to proceed with apt for AWV prior to CPE. Also stated her BS was up in am; Asked the patient to document her bs and food choices x 3 days and bring this in next week. The patient agreed to fup. / Also was concerned px for klonopin would be sent to mail order/ Call was made to confirm that rx was rec'd and filled at [facility] [address];

## 2015-01-06 NOTE — Telephone Encounter (Signed)
Patient is requesting refill on [facility]lonopin [facility] [address].

## 2015-01-06 NOTE — Telephone Encounter (Signed)
OK x 1 

## 2015-01-07 ENCOUNTER — Other Ambulatory Visit: Payer: Self-pay | Admitting: Emergency Medicine

## 2015-01-07 DIAGNOSIS — H60391 Other infective otitis externa, right ear: Secondary | ICD-10-CM

## 2015-01-10 DIAGNOSIS — R809 Proteinuria, unspecified: Secondary | ICD-10-CM | POA: Diagnosis not present

## 2015-01-10 DIAGNOSIS — N393 Stress incontinence (female) (male): Secondary | ICD-10-CM | POA: Diagnosis not present

## 2015-01-10 DIAGNOSIS — Z6831 Body mass index (BMI) 31.0-31.9, adult: Secondary | ICD-10-CM | POA: Diagnosis not present

## 2015-01-10 DIAGNOSIS — E669 Obesity, unspecified: Secondary | ICD-10-CM | POA: Diagnosis not present

## 2015-01-10 DIAGNOSIS — Z79899 Other long term (current) drug therapy: Secondary | ICD-10-CM | POA: Diagnosis not present

## 2015-01-10 DIAGNOSIS — R300 Dysuria: Secondary | ICD-10-CM | POA: Diagnosis not present

## 2015-01-10 DIAGNOSIS — R319 Hematuria, unspecified: Secondary | ICD-10-CM | POA: Diagnosis not present

## 2015-01-10 DIAGNOSIS — Z960 Presence of urogenital implants: Secondary | ICD-10-CM | POA: Diagnosis not present

## 2015-01-11 ENCOUNTER — Encounter: Payer: Medicare Other | Admitting: Internal Medicine

## 2015-01-12 ENCOUNTER — Other Ambulatory Visit (INDEPENDENT_AMBULATORY_CARE_PROVIDER_SITE_OTHER): Payer: Medicare Other

## 2015-01-12 ENCOUNTER — Encounter: Payer: Self-pay | Admitting: Internal Medicine

## 2015-01-12 ENCOUNTER — Ambulatory Visit (INDEPENDENT_AMBULATORY_CARE_PROVIDER_SITE_OTHER): Payer: Medicare Other | Admitting: Internal Medicine

## 2015-01-12 VITALS — BP 140/80 | HR 63 | Temp 97.6°F | Ht 66.0 in | Wt 190.0 lb

## 2015-01-12 DIAGNOSIS — E1329 Other specified diabetes mellitus with other diabetic kidney complication: Secondary | ICD-10-CM

## 2015-01-12 DIAGNOSIS — N058 Unspecified nephritic syndrome with other morphologic changes: Secondary | ICD-10-CM | POA: Diagnosis not present

## 2015-01-12 DIAGNOSIS — I10 Essential (primary) hypertension: Secondary | ICD-10-CM | POA: Diagnosis not present

## 2015-01-12 DIAGNOSIS — E1365 Other specified diabetes mellitus with hyperglycemia: Secondary | ICD-10-CM

## 2015-01-12 DIAGNOSIS — Z23 Encounter for immunization: Secondary | ICD-10-CM

## 2015-01-12 DIAGNOSIS — E782 Mixed hyperlipidemia: Secondary | ICD-10-CM | POA: Diagnosis not present

## 2015-01-12 DIAGNOSIS — Z0000 Encounter for general adult medical examination without abnormal findings: Secondary | ICD-10-CM | POA: Diagnosis not present

## 2015-01-12 DIAGNOSIS — E559 Vitamin D deficiency, unspecified: Secondary | ICD-10-CM | POA: Diagnosis not present

## 2015-01-12 DIAGNOSIS — E041 Nontoxic single thyroid nodule: Secondary | ICD-10-CM

## 2015-01-12 DIAGNOSIS — IMO0002 Reserved for concepts with insufficient information to code with codable children: Secondary | ICD-10-CM

## 2015-01-12 LAB — BASIC METABOLIC PANEL
BUN: 16 mg/dL (ref 6–23)
CO2: 28 mEq/L (ref 19–32)
Calcium: 9.7 mg/dL (ref 8.4–10.5)
Chloride: 102 mEq/L (ref 96–112)
Creatinine, Ser: 0.78 mg/dL (ref 0.40–1.20)
GFR: 76.23 mL/min (ref >60.00)
GLUCOSE: 165 mg/dL — AB (ref 70–99)
Potassium: 4.6 mEq/L (ref 3.5–5.1)
Sodium: 136 mEq/L (ref 135–145)

## 2015-01-12 LAB — LIPID PANEL
CHOL/HDL RATIO: 3
Cholesterol: 113 mg/dL (ref 0–200)
HDL: 37.4 mg/dL — ABNORMAL LOW (ref >39.00)
LDL Cholesterol: 47 mg/dL (ref 0–99)
NONHDL: 75.6
Triglycerides: 141 mg/dL (ref 0.0–149.0)
VLDL: 28.2 mg/dL (ref 0.0–40.0)

## 2015-01-12 LAB — MICROALBUMIN / CREATININE URINE RATIO
Creatinine,U: 110.8 mg/dL
MICROALB/CREAT RATIO: 0.6 mg/g (ref 0.0–30.0)

## 2015-01-12 LAB — CK: CK TOTAL: 75 U/L (ref 7–177)

## 2015-01-12 LAB — VITAMIN D 25 HYDROXY (VIT D DEFICIENCY, FRACTURES): VITD: 49 ng/mL (ref 30.00–100.00)

## 2015-01-12 LAB — HEPATIC FUNCTION PANEL
ALT: 16 U/L (ref 0–35)
AST: 20 U/L (ref 0–37)
Albumin: 4.3 g/dL (ref 3.5–5.2)
Alkaline Phosphatase: 35 U/L — ABNORMAL LOW (ref 39–117)
BILIRUBIN TOTAL: 0.7 mg/dL (ref 0.2–1.2)
Bilirubin, Direct: 0.1 mg/dL (ref 0.0–0.3)
Total Protein: 6.9 g/dL (ref 6.0–8.3)

## 2015-01-12 LAB — T3, FREE: T3, Free: 3.2 pg/mL (ref 2.3–4.2)

## 2015-01-12 LAB — HEMOGLOBIN A1C: Hgb A1c MFr Bld: 7.2 % — ABNORMAL HIGH (ref 4.6–6.5)

## 2015-01-12 LAB — T4, FREE: Free T4: 0.89 ng/dL (ref 0.60–1.60)

## 2015-01-12 LAB — TSH: TSH: 4.17 u[IU]/mL (ref 0.35–4.50)

## 2015-01-12 NOTE — Assessment & Plan Note (Signed)
Blood pressure goals reviewed. BMET 

## 2015-01-12 NOTE — Progress Notes (Signed)
Subjective:   [name] is a 74 y.o. female who presents for Medicare Annual (Subsequent) preventive examination.  Review of Systems:   HRA assessment completed during visit;  Patient is here for Annual Wellness Assessment The goal of the wellness visit is to assist the patient how to close the gaps in care and create a preventative care plan for the patient.    Problem list review for risk / HTN; CVA (right hand) ; DM; OA; Osteopenia; Vit d; Hyperlipidemia; all under current medical treatment Lipids 01/2014; chol 115; Trig 79; HDL 39 and LDL 60; Ratio 3  Carotid stenosis 50 to 69% stenosis A1c; 7.1 (diabetic x 15) on 05/2014/ test for Microalbumin ordered 06/16/14 not completed States she has problems with bladder infections; recent burning; started memorial day BMI: 30.7 Diet; watch sweets and carbs; meat and vegetables; Breakfast; try to eat oatmeal and cereal; egg sometimes;  Lunch; hungry at 2 to 3pm ; sandwich;  Exercise; water aerobics; 45 tiw; Has a garden; takes the dog for a walk   Family Hx  Social history: lives alone; son is in back apt  Personalized education given regarding risk States she is baby of 16; both sisters / discussed caregiving; Plans to get away; 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 Stop Actonel but continue walking 3-4X/week 30 -45 minutes,Calcium 600 mg two times a day, vitamin D @ least 1000 International Units once daily & repeat BMD in 25 mos.  Mammogram - January 2016; unremarkable Vision: dry  eyes; retasis x 2 a day; systane bid; had eye exam; no diabetic retinopathy;  States she has a "macular hole" so she did go to opthalomologist has seen / ? one on [address]  Hearing: 4000hz  both ears; right ear some was buildup and was sent to ENT;  Dental: regularly    Current Care Team reviewed and updated [name]; Urology Dr. Natasha Bence neurology        Objective:     Vitals: BP 140/80 mmHg  Pulse 63  Temp(Src) 97.6 F (36.4 C) (Oral)  Ht 5\' 6"  (1.676 m)  Wt 190 lb (86.183 kg)  BMI 30.68 kg/m2  SpO2 95%  Tobacco History  Smoking status  . Never Smoker   Smokeless tobacco  . Never Used     Counseling given: Not Answered   Past Medical History  Diagnosis Date  . Hyperlipidemia   . Hypertension   . Depression     PMH of  . Osteoporosis   . Thyroid disease     right thyroid nodule-- BX DONE 08-15-2011  . DVT (deep venous thrombosis) 2006    after arthroscopy  . Obesity     Redux therapy  . MRSA (methicillin resistant staph aureus) culture positive     X3; last post TKR  . History of CVA (cerebrovascular accident) NEUROLOGIST  DR WILLIS----  06-01-2010-  LEFT BASAL GANGLIA HEMORRAGE    RESIDUAL RIGHT HAND NUMBNESS/TINGLING  . Numbness and tingling in right hand RESIDUAL FROM CVA NOV 2011  . Diabetes mellitus ORAL MED  .  Anxiety   . Asymptomatic carotid artery stenosis LEFT --  MOD. PER DR WILLIS NOTE OCT 2012  . Benign heart murmur   . Nocturia   . Frequency of urination   . Arthritis HANDS, NECK , BACK   Past Surgical History  Procedure Laterality Date  . Carpal tunnel release  2000    RIGHT  . G2 p2    . Knee arthroscopy      BILATERAL PRIOR TO TOTAL KNEE  . Total knee arthroplasty  05-26-2002       LEFT   AND RIGHT  03-30-2098  . Lumbar laminectomy  08-26-2009    L 4 - 5  . Cystoscope      to evaluate recurrent UTIs  . Colonoscopy  1999 & 2005    Dr Olevia Perches  . Abdominal hysterectomy  1973    Endometriosis & fibroid  .  Left shoulder surg  1990'S  . Joint replacement  03-28-2006    LEFT THUMB  . Thyroid nodule bx  08-15-2011    RIGHT  . Cystoscopy with biopsy  08/22/2011    Procedure: CYSTOSCOPY WITH BIOPSY;  Surgeon: Molli Hazard, MD;  Location: [facility];  Service: Urology;  Laterality: N/A;  . Cystoscopy w/ retrogrades  08/22/2011    Procedure: CYSTOSCOPY WITH RETROGRADE PYELOGRAM;  Surgeon: Molli Hazard, MD;  Location: [facility];  Service: Urology;  Laterality: Bilateral;  . Wrist surgery  2014    tendon placed in joint; Dr Burney Gauze   Family History  Problem Relation Age of Onset  . Bladder Cancer Mother   . Diabetes Mother   . Brain cancer Sister   . Prostate cancer Brother   . Alzheimer's disease Brother   . Diabetes Maternal Aunt   . Diabetes Maternal Uncle   . Diabetes Maternal Grandmother     Type 2  . Diabetes Maternal Grandfather     Type 2  . Stroke Paternal Grandfather     in 54s  . Diabetes Sister 67    TYPE 1   . Pancreatic cancer Sister   . Kidney failure Sister     in context of DM & influenza  . Heart disease Neg Hx    History  Sexual Activity  . Sexual Activity: Not on file    Outpatient Encounter Prescriptions as of 01/12/2015  Medication Sig  . aspirin 81 MG tablet Take 81 mg by mouth daily.  . benazepril (LOTENSIN) 20 MG tablet TAKE 1 TABLET DAILY  . calcium citrate-vitamin D 500-400 MG-UNIT chewable tablet Chew 1 tablet by mouth daily.  . Cholecalciferol (VITAMIN D3) 1000 UNITS CAPS Take 1 capsule by mouth daily.  . citalopram (CELEXA) 20 MG tablet TAKE 1 TABLET DAILY  . clonazePAM (KLONOPIN) 0.5 MG tablet Take 1 tablet (0.5 mg total) by mouth as needed for anxiety.  Marland Kitchen glimepiride (AMARYL) 1 MG tablet TAKE 1 TABLET DAILY WITH BREAKFAST  . glucose blood (FREESTYLE LITE) test strip Check blood sugar daily as directed, DX: 250.00  . metFORMIN (GLUCOPHAGE) 1000 MG tablet TAKE 1 TABLET TWICE A DAY WITH MEALS  .  methylcellulose (ARTIFICIAL TEARS) 1 % ophthalmic solution Place 1 drop into both eyes as needed.  . Multiple Vitamins-Calcium (ONE-A-DAY WOMENS FORMULA) TABS Take 1 tablet by mouth daily.  . pravastatin (PRAVACHOL) 40 MG tablet TAKE 1 TABLET AT BEDTIME  . amoxicillin-clavulanate (AUGMENTIN) 875-125 MG per tablet Take 1 tablet by mouth 2 (two) times daily. (Patient not taking: Reported on  12/06/2014)  . ciprofloxacin-hydrocortisone (CIPRO HC) otic suspension Use 3-5 drops twice daily in right ear. (Patient not taking: Reported on 12/06/2014)   No facility-administered encounter medications on file as of 01/12/2015.    Activities of Daily Living In your present state of health, do you have any difficulty performing the following activities: 01/12/2015  Hearing? N  Vision? N  Difficulty concentrating or making decisions? N  Walking or climbing stairs? N  Dressing or bathing? N  Doing errands, shopping? N  Preparing Food and eating ? N  Using the Toilet? N  In the past six months, have you accidently leaked urine? N  Do you have problems with loss of bowel control? N  Managing your Medications? N  Managing your Finances? N  Housekeeping or managing your Housekeeping? N    Patient Care Team: Hendricks Limes, MD as PCP - General    Assessment:    Objective:  Personalized Education was given regarding: caregiving stress and relaxation;   Pt determined a personalized goal; see patient goals; patient will continue to exercise   Assessment included: Bone density scan as appropriate Calcium and Vit D as appropriate/ Osteoporosis risk reviewed Taking meds without issues; no barriers identified Labs were and fup visit noted with MD if labs are due to be re-drawn. Stress: Recommendations for managing stress if assessed as a factor;  No Risk for hepatitis or high risk social behavior identified via hepatitis screen Educated on shingles and follow up with insurance company for co-pays or  charges applied to Part D benefit. Educated on Vaccines; needs Restaurant manager, fast food issues reviewed; Cognition assessed by AD8; Score 0 MMSE deferred as the patient stated they had no memory issues; No identified risk were noted; The patient was oriented x 3; appropriate in dress and manner and no objective failures at ADL's or IADL's.      Exercise Activities and Dietary recommendations    Goals    . make appointments with self     Will continue to take personal time in lieu of many caregiving activities      Fall Risk Fall Risk  01/12/2015 08/22/2012  Falls in the past year? Yes No  Number falls in past yr: 2 or more -  Injury with Fall? No -  Risk for fall due to : Other (Comment) -  Risk for fall due to (comments): works on balance in pool -   Depression Screen PHQ 2/9 Scores 01/12/2015 08/22/2012  PHQ - 2 Score 0 0     Cognitive Testing MMSE - Mini Mental State Exam 01/12/2015  Not completed: Unable to complete    Immunization History  Administered Date(s) Administered  . Influenza Whole 07/24/1999, 06/13/2005, 06/17/2007, 04/14/2008, 04/11/2010  . Influenza, Seasonal, Injecte, Preservative Fre 06/15/2014  . Influenza-Unspecified 04/22/2013  . Pneumococcal Polysaccharide-23 08/03/2004  . Td 08/05/1996, 09/29/2010  . Zoster 12/21/2010   Screening Tests Health Maintenance  Topic Date Due  . FOOT EXAM  08/13/1950  . PNA vac Low Risk Adult (2 of 2 - PCV13) 08/03/2005  . URINE MICROALBUMIN  06/08/2014  . HEMOGLOBIN A1C  12/13/2014  . INFLUENZA VACCINE  02/21/2015  . OPHTHALMOLOGY EXAM  11/03/2015  . TETANUS/TDAP  09/28/2020  . COLONOSCOPY  01/21/2023  . DEXA SCAN  Completed  . ZOSTAVAX  Completed      Plan:   Plan   The patient agrees to: Take prenar today; Will discuss urinary issues with Dr. Linna Darner and carotid study results;   Advanced directive: comleted  During the course of the visit the patient was educated and counseled about the following  appropriate screening and preventive services:   Vaccines to include Pneumoccal, Influenza, Hepatitis B, Td, Zostavax, HCV/ will take prevnar  Electrocardiogram  Cardiovascular Disease; understands risk; watches diet; exercises; to take time for self  Colorectal cancer screening- completed  Bone density screening ; taking cal and vit d  Diabetes screening /   Glaucoma screening neg this year/ ? Macular hole being followed  Mammography/PAP completed  Nutrition counseling   Patient Instructions (the written plan) was given to the patient.   Wynetta Fines, RN  01/12/2015

## 2015-01-12 NOTE — Assessment & Plan Note (Signed)
A1c , urine microalbumin, BMET 

## 2015-01-12 NOTE — Progress Notes (Signed)
   Subjective:    Patient ID: [name], female    DOB: 1941/05/23, 74 y.o.   MRN: [id number]  HPI The patient is also here to assess status of active health conditions.  PMH, FH, & Social History reviewed & updated.  She is on a modified heart healthy diet with sodium restriction. She engages in water aerobics 3 times a week for 45 minutes without cardiopulmonary symptoms.  She had dysuria over the [facility] Day weekend. She saw the urologist; no antibiotic was Rxed. Because of symptoms she took a leftover antibiotic with resolution of symptoms. When she was actively having the genitourinary symptoms her fasting blood sugars were over 200. They have now dropped.  She did have a bladder sling in Feb of this year.  Ophthalmologic exam is up-to-date.  She describes chronic numbness in the right upper extremity related to previous stroke.  Colonoscopy was attempted prior to her urologic procedures at [facility]. .She has no GI or other active symptoms at this time.  Review of Systems  Chest pain, palpitations, tachycardia, exertional dyspnea, paroxysmal nocturnal dyspnea, claudication or edema are absent. No unexplained weight loss, abdominal pain, significant dyspepsia, dysphagia, melena, rectal bleeding, or persistently small caliber stools. Dysuria, pyuria, hematuria, frequency, nocturia or polyuria are denied @ present. Change in hair, skin, nails denied. No bowel changes of constipation or diarrhea. No intolerance to heat or cold.     Objective:   Physical Exam Pertinent or positive findings include: She has ptosis greater on the right than the left. She has minor DIP changes in the hands. Deep tendon reflexes are 0-1/2+ at the knees. Sensation is intact and equal in the feet. General appearance :adequately nourished; in no distress.  Eyes: No conjunctival inflammation or scleral icterus is present.  Oral exam:  Lips and gums are healthy appearing.There is no oropharyngeal  erythema or exudate noted. Dental hygiene is good.  Heart:  Normal rate and regular rhythm. S1 and S2 normal without gallop, murmur, click, rub or other extra sounds    Lungs:Chest clear to auscultation; no wheezes, rhonchi,rales ,or rubs present.No increased work of breathing.   Abdomen: bowel sounds normal, soft and non-tender without masses, organomegaly or hernias noted.  No guarding or rebound. No flank tenderness to percussion.  Vascular : all pulses equal ; no bruits present.  Skin:Warm & dry.  Intact without suspicious lesions or rashes ; no tenting or jaundice   Lymphatic: No lymphadenopathy is noted about the head, neck, axilla, or inguinal areas.   Neuro: Strength, tone  normal.          Assessment & Plan:  See Current Assessment & Plan in Problem List under specific Diagnosis

## 2015-01-12 NOTE — Assessment & Plan Note (Signed)
Vit D level.

## 2015-01-12 NOTE — Assessment & Plan Note (Signed)
TFTs

## 2015-01-12 NOTE — Assessment & Plan Note (Signed)
Lipids, LFTs, TSH ,CK 

## 2015-01-12 NOTE — Patient Instructions (Signed)
  Your next office appointment will be determined based upon review of your pending labs  and  xrays  Those written interpretation of the lab results and instructions will be transmitted to you by mail for your records.  Critical results will be called.   Followup as needed for any active or acute issue. Please report any significant change in your symptoms. 

## 2015-01-13 ENCOUNTER — Ambulatory Visit (INDEPENDENT_AMBULATORY_CARE_PROVIDER_SITE_OTHER): Payer: Medicare Other

## 2015-01-13 DIAGNOSIS — I6529 Occlusion and stenosis of unspecified carotid artery: Secondary | ICD-10-CM | POA: Diagnosis not present

## 2015-01-17 ENCOUNTER — Other Ambulatory Visit: Payer: Self-pay | Admitting: Internal Medicine

## 2015-01-26 ENCOUNTER — Telehealth: Payer: Self-pay | Admitting: Neurology

## 2015-01-26 DIAGNOSIS — I6522 Occlusion and stenosis of left carotid artery: Secondary | ICD-10-CM

## 2015-01-26 NOTE — Telephone Encounter (Signed)
I called the patient. The carotid doppler suggests > 70% stenosis of the left ICA, I will check a CTA to confirm, and if present, refer to vascular surgery.

## 2015-01-30 ENCOUNTER — Other Ambulatory Visit: Payer: Self-pay | Admitting: Internal Medicine

## 2015-01-31 ENCOUNTER — Telehealth: Payer: Self-pay | Admitting: Neurology

## 2015-01-31 ENCOUNTER — Ambulatory Visit
Admission: RE | Admit: 2015-01-31 | Discharge: 2015-01-31 | Disposition: A | Discharge status: Home / Self Care | Payer: Medicare Other | Source: Ambulatory Visit | Attending: Neurology | Admitting: Neurology

## 2015-01-31 DIAGNOSIS — I6522 Occlusion and stenosis of left carotid artery: Secondary | ICD-10-CM

## 2015-01-31 DIAGNOSIS — I7090 Unspecified atherosclerosis: Secondary | ICD-10-CM | POA: Diagnosis not present

## 2015-01-31 MED ORDER — IOPAMIDOL (ISOVUE-370) INJECTION 76%
75.0000 mL | Freq: Once | INTRAVENOUS | Status: AC | PRN
  Administered 2015-01-31: 75 mL via INTRAVENOUS

## 2015-01-31 NOTE — Telephone Encounter (Signed)
I called the patient. The CTA of the neck shows no carotid stenosis. No indication for a vascular surgery referral.   CTA neck 01/31/15:  IMPRESSION: 1. Minimal atherosclerosis. No arterial stenosis in the neck. 2. Mild left greater than right cervical ICA fibromuscular dysplasia. Left greater than right cervical ICA tortuosity. 3. Left greater than right vertebral artery tortuosity.

## 2015-02-02 ENCOUNTER — Telehealth: Payer: Self-pay | Admitting: Internal Medicine

## 2015-02-02 DIAGNOSIS — H2512 Age-related nuclear cataract, left eye: Secondary | ICD-10-CM | POA: Diagnosis not present

## 2015-02-02 DIAGNOSIS — H25011 Cortical age-related cataract, right eye: Secondary | ICD-10-CM | POA: Diagnosis not present

## 2015-02-02 DIAGNOSIS — H25041 Posterior subcapsular polar age-related cataract, right eye: Secondary | ICD-10-CM | POA: Diagnosis not present

## 2015-02-02 DIAGNOSIS — H2511 Age-related nuclear cataract, right eye: Secondary | ICD-10-CM | POA: Diagnosis not present

## 2015-02-02 NOTE — Telephone Encounter (Signed)
Will send back to [place] to ensure that this message is taken care of by IM triage.

## 2015-02-02 NOTE — Telephone Encounter (Signed)
Please advise 

## 2015-02-02 NOTE — Telephone Encounter (Signed)
72 hrs post CT scan

## 2015-02-02 NOTE — Telephone Encounter (Signed)
Pt had a cat scan done on Mon of this week and was told to stop metformin for 48 hours.  Pt is wanting to know from her PCP if she can resume her metformin.

## 2015-02-02 NOTE — Telephone Encounter (Signed)
I believe this was supposed to go to IM triage

## 2015-02-04 NOTE — Telephone Encounter (Signed)
Patient notified per MD that she may restart 72 hrs post CT.

## 2015-02-09 ENCOUNTER — Other Ambulatory Visit: Payer: Self-pay | Admitting: Emergency Medicine

## 2015-02-09 ENCOUNTER — Telehealth: Payer: Self-pay | Admitting: Internal Medicine

## 2015-02-09 DIAGNOSIS — N390 Urinary tract infection, site not specified: Secondary | ICD-10-CM | POA: Diagnosis not present

## 2015-02-09 MED ORDER — CLONAZEPAM 0.5 MG PO TABS: 0.5000 mg | ORAL_TABLET | ORAL | Status: DC | PRN

## 2015-02-09 NOTE — Telephone Encounter (Signed)
OK X1  My retirement date is 07/23/2015; but I will be in office on a limited schedule Oct-Dec. To guarantee continuity of care you should transition your care to another PCP by Oct 1,2016.     

## 2015-02-09 NOTE — Telephone Encounter (Signed)
Please advise, last ov was 9/15

## 2015-02-09 NOTE — Telephone Encounter (Signed)
Pt called in and needs refill on her clonazePAM (KLONOPIN) 0.5 MG tablet [[id number]]  [facility] on [address]

## 2015-02-14 ENCOUNTER — Other Ambulatory Visit: Payer: Self-pay | Admitting: Internal Medicine

## 2015-02-28 ENCOUNTER — Ambulatory Visit (HOSPITAL_BASED_OUTPATIENT_CLINIC_OR_DEPARTMENT_OTHER): Payer: Medicare Other | Attending: Physician Assistant | Admitting: Radiology

## 2015-02-28 VITALS — Ht 66.0 in | Wt 185.0 lb

## 2015-02-28 DIAGNOSIS — R0683 Snoring: Secondary | ICD-10-CM | POA: Diagnosis not present

## 2015-02-28 DIAGNOSIS — R5383 Other fatigue: Secondary | ICD-10-CM | POA: Diagnosis present

## 2015-02-28 DIAGNOSIS — G4733 Obstructive sleep apnea (adult) (pediatric): Secondary | ICD-10-CM | POA: Diagnosis not present

## 2015-02-28 DIAGNOSIS — G4761 Periodic limb movement disorder: Secondary | ICD-10-CM | POA: Diagnosis not present

## 2015-03-05 DIAGNOSIS — G4733 Obstructive sleep apnea (adult) (pediatric): Secondary | ICD-10-CM | POA: Diagnosis not present

## 2015-03-05 DIAGNOSIS — R0683 Snoring: Secondary | ICD-10-CM | POA: Diagnosis not present

## 2015-03-05 NOTE — Progress Notes (Signed)
  Patient Name: [name], [name] Date: 02/28/2015 Gender: Female D.O.B: 1940-10-25 Age (years): 74 Referring Provider: Jolene Provost Height (inches): 66 Interpreting Physician: Baird Lyons MD, ABSM Weight (lbs): 185 RPSGT: Carolin Coy BMI: 30 MRN: [id number] Neck Size: 14.50 CLINICAL INFORMATION Sleep Study Type: NPSG     Indication for sleep study: Diabetes, Fatigue, Snoring     Epworth Sleepiness Score: 10  SLEEP STUDY TECHNIQUE As per the AASM Manual for the Scoring of Sleep and Associated Events v2.3 (April 2016) with a hypopnea requiring 4% desaturations.  The channels recorded and monitored were frontal, central and occipital EEG, electrooculogram (EOG), submentalis EMG (chin), nasal and oral airflow, thoracic and abdominal wall motion, anterior tibialis EMG, snore microphone, electrocardiogram, and pulse oximetry. Continuous positive airway pressure (CPAP) was initiated when the patient met split night criteria and was titrated according to treat sleep-disordered breathing.  MEDICATIONS Medications taken by the patient :Charted for review Medications administered by patient during sleep study : clonazepam, aspirin, pravastatin  RESPIRATORY PARAMETERS Diagnostic  Total AHI (/hr): 8.5 RDI (/hr): 12.1 OA Index (/hr): 4.3 CA Index (/hr): 0.0 REM AHI (/hr): 31.1 NREM AHI (/hr): 1.2 Supine AHI (/hr): 22.1 Non-supine AHI (/hr): 0.00 Min O2 Sat (%): 80.00 Mean O2 (%): 94.17 Time below 88% (min): 7.6     SLEEP ARCHITECTURE The recording time for the entire night was 431.1 minutes.  During a baseline period of 431.1 minutes, the patient slept for 338.0 minutes in REM and nonREM, yielding a sleep efficiency of 78.4%. Sleep onset after lights out was 18.6 minutes with a REM latency of 172.0 minutes. The patient spent 18.20% of the night in stage N1 sleep, 57.25% in stage N2 sleep, 0.00% in stage N3 and 24.56% in REM.  Marland KitchenCARDIAC DATA The 2 lead EKG demonstrated  sinus rhythm. The mean heart rate was 61.31 beats per minute. Other EKG findings include: None.  LEG MOVEMENT DATA The total Periodic Limb Movements of Sleep (PLMS) were 436. The PLMS index was 77.40 .  IMPRESSIONS Mild obstructive sleep apnea occurred during the diagnostic portion of the study (AHI = 8.5 /hour). She did not have enough early events to qualify for split protocol CPAP titration. No significant central sleep apnea occurred during the diagnostic portion of the study (CAI = 0.0/hour). The patient snored with Moderate snoring volume during the diagnostic portion of the study. No cardiac abnormalities were noted during this study. Severe periodic limb movements of sleep occurred during the study. PLMA 19.7/ hr  DIAGNOSIS Obstructive Sleep Apnea (327.23 [G47.33 ICD-10]) Periodic Limb Movement Syndrome (327.51 [G47.61 ICD-10])  RECOMMENDATIONS The patient can return for a dedicated CPAP titration study if appropriate. If limb movement sleep disturbance remains clinically significant in the home environment after treatment for sleep apnea, then a trial of specific therapy such as Requip or Mirapex might be considered. Avoid alcohol, sedatives and other CNS depressants that may worsen sleep apnea and disrupt normal sleep architecture. Sleep hygiene should be reviewed to assess factors that may improve sleep quality. Weight management and regular exercise should be initiated or continued.    Deneise Lever Diplomate, American Board of Sleep Medicine  ELECTRONICALLY SIGNED ON:  03/05/2015, 11:36 AM [place] PH: [phone]   FX: [phone] [place]

## 2015-03-08 DIAGNOSIS — H2511 Age-related nuclear cataract, right eye: Secondary | ICD-10-CM | POA: Diagnosis not present

## 2015-03-09 DIAGNOSIS — H2512 Age-related nuclear cataract, left eye: Secondary | ICD-10-CM | POA: Diagnosis not present

## 2015-03-09 DIAGNOSIS — H25012 Cortical age-related cataract, left eye: Secondary | ICD-10-CM | POA: Diagnosis not present

## 2015-03-10 ENCOUNTER — Other Ambulatory Visit: Payer: Self-pay | Admitting: Internal Medicine

## 2015-03-10 NOTE — Telephone Encounter (Signed)
Last OV 9/15, last refill 02/09/15. Please advise

## 2015-03-15 DIAGNOSIS — H2512 Age-related nuclear cataract, left eye: Secondary | ICD-10-CM | POA: Diagnosis not present

## 2015-03-16 ENCOUNTER — Other Ambulatory Visit: Payer: Self-pay | Admitting: Internal Medicine

## 2015-03-16 NOTE — Telephone Encounter (Signed)
pts last OV 1/16, please advise

## 2015-03-16 NOTE — Telephone Encounter (Signed)
OK X 3 mos  My retirement date is 07/23/2015; but I will be in office on a limited schedule Oct-Dec. To guarantee continuity of care you should transition your care to another PCP by Oct 1,2016.     

## 2015-03-25 ENCOUNTER — Other Ambulatory Visit: Payer: Self-pay | Admitting: Emergency Medicine

## 2015-03-25 ENCOUNTER — Other Ambulatory Visit: Payer: Self-pay | Admitting: Internal Medicine

## 2015-03-25 MED ORDER — PRAVASTATIN SODIUM 40 MG PO TABS: 40.0000 mg | ORAL_TABLET | Freq: Every day | ORAL | Status: DC

## 2015-04-12 ENCOUNTER — Ambulatory Visit (INDEPENDENT_AMBULATORY_CARE_PROVIDER_SITE_OTHER): Payer: Medicare Other | Admitting: Physician Assistant

## 2015-04-12 ENCOUNTER — Other Ambulatory Visit: Payer: Self-pay | Admitting: Family

## 2015-04-12 VITALS — BP 126/62 | HR 81 | Temp 97.8°F | Resp 18 | Ht 65.5 in | Wt 186.0 lb

## 2015-04-12 DIAGNOSIS — I6522 Occlusion and stenosis of left carotid artery: Secondary | ICD-10-CM | POA: Diagnosis not present

## 2015-04-12 DIAGNOSIS — R300 Dysuria: Secondary | ICD-10-CM | POA: Diagnosis not present

## 2015-04-12 LAB — POC MICROSCOPIC URINALYSIS (UMFC): MUCUS RE: ABSENT

## 2015-04-12 LAB — POCT URINALYSIS DIP (MANUAL ENTRY)
BILIRUBIN UA: NEGATIVE
BILIRUBIN UA: NEGATIVE
Glucose, UA: NEGATIVE
Nitrite, UA: NEGATIVE
Protein Ur, POC: NEGATIVE
Spec Grav, UA: 1.015
Urobilinogen, UA: 0.2
pH, UA: 5

## 2015-04-12 MED ORDER — SULFAMETHOXAZOLE-TRIMETHOPRIM 800-160 MG PO TABS: 1.0000 | ORAL_TABLET | Freq: Two times a day (BID) | ORAL | Status: AC

## 2015-04-12 NOTE — Telephone Encounter (Signed)
Last refill 8/18

## 2015-04-12 NOTE — Progress Notes (Signed)
Patient ID: [name], female    DOB: Mar 07, 1941, 74 y.o.   MRN: [id number]  PCP: Unice Cobble, MD  Subjective:   Chief Complaint  Patient presents with  . Dysuria    x 2 weeks  . Back Pain    HPI Presents for evaluation of dysuria x 2 weeks.   She has been taking OTC Urostat but has not taken it yesterday or today as she did not want it to affect any lab results. She has had urinary frequency as well as urgency with some leakage. She had surgery for urinary leakage one year ago. No fever or chills. No hematuria. No vaginal discharge or vaginal pain. Sexually active with one partner. She is not concerned about potential STIs. She is having right lower back pain. No myalgias. Some lower abdominal pain, which she describes as sore.    Review of Systems Constitutional: Negative for fever and chills.  Gastrointestinal: Negative for nausea, vomiting, abdominal pain, constipation and rectal pain.  Genitourinary: Positive for dysuria, urgency and frequency. Negative for vaginal discharge and vaginal pain.  Musculoskeletal: Positive for back pain. Negative for myalgias.      Patient Active Problem List   Diagnosis Date Noted  . SUI (stress urinary incontinence, female) 02/05/2014  . 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 C V A/STROKE 05/31/2010  . DM (diabetes mellitus), secondary, uncontrolled, with renal complications 38/25/0539  . DYSURIA 01/24/2010  . THYROID NODULE, RIGHT 01/21/2009  . FATIGUE 07/12/2008  . Vitamin D deficiency 12/10/2007  . HYPERLIPIDEMIA 06/17/2007  . Essential hypertension 06/17/2007  . Osteopenia 06/17/2007  . Osteoarthrosis, unspecified whether generalized or localized, unspecified site 11/11/2006     Prior to Admission medications   Medication Sig Start Date End Date Taking? Authorizing Provider  aspirin 81 MG tablet Take 81 mg by mouth daily.   Yes Historical Provider, MD  benazepril (LOTENSIN) 20 MG tablet TAKE 1 TABLET DAILY 03/10/15  Yes  Hendricks Limes, MD  calcium citrate-vitamin D 500-400 MG-UNIT chewable tablet Chew 1 tablet by mouth daily.   Yes Historical Provider, MD  Cholecalciferol (VITAMIN D3) 1000 UNITS CAPS Take 1 capsule by mouth daily.   Yes Historical Provider, MD  citalopram (CELEXA) 20 MG tablet TAKE 1 TABLET DAILY (NEED OFFICE VISIT BEFORE ANY FURTHER REFILLS) 03/16/15  Yes Hendricks Limes, MD  clonazePAM (KLONOPIN) 0.5 MG tablet TAKE 1 TABLET BY MOUTH AS NEEDED FOR ANXIETY 04/12/15  Yes Golden Circle, FNP  FREESTYLE LITE test strip USE TO CHECK BLOOD SUGAR DAILY AS DIRECTED 02/14/15  Yes Hendricks Limes, MD  glimepiride (AMARYL) 1 MG tablet TAKE 1 TABLET DAILY WITH BREAKFAST 01/17/15  Yes Hendricks Limes, MD  metFORMIN (GLUCOPHAGE) 1000 MG tablet TAKE 1 TABLET TWICE A DAY WITH MEALS 02/01/15  Yes Hendricks Limes, MD  methylcellulose (ARTIFICIAL TEARS) 1 % ophthalmic solution Place 1 drop into both eyes as needed.   Yes Historical Provider, MD  Multiple Vitamins-Calcium (ONE-A-DAY WOMENS FORMULA) TABS Take 1 tablet by mouth daily.   Yes Historical Provider, MD  pravastatin (PRAVACHOL) 40 MG tablet Take 1 tablet (40 mg total) by mouth at bedtime. --- No further refills until office visit is scheduled. 03/25/15  Yes Hendricks Limes, MD     Allergies  Allergen Reactions  . Conjugated Estrogens     REACTION: weight gain  & fibrocystic breast disease changes  . Percocet [Oxycodone-Acetaminophen] Itching  . Detrol [Tolterodine]     Rxed by [facility] Excessive  drying       Objective:  Physical Exam  Constitutional: She is oriented to person, place, and time. She appears well-developed and well-nourished. No distress.  BP 126/62 mmHg  Pulse 81  Temp(Src) 97.8 F (36.6 C)  Resp 18  Ht 5' 5.5" (1.664 m)  Wt 186 lb (84.369 kg)  BMI 30.47 kg/m2  SpO2 97%   Eyes: Conjunctivae are normal. No scleral icterus.  Neck: No thyromegaly present.  Cardiovascular: Normal rate, regular rhythm, normal heart  sounds and intact distal pulses.   Pulmonary/Chest: Effort normal and breath sounds normal.  Abdominal: Normal appearance and bowel sounds are normal. She exhibits no distension and no mass. There is no hepatosplenomegaly. There is tenderness in the suprapubic area. There is no CVA tenderness.  Musculoskeletal:       Cervical back: Normal.       Thoracic back: Normal.       Lumbar back: She exhibits tenderness (RIGHT lower paraspinous muscles) and pain. She exhibits normal range of motion and no bony tenderness.  Lymphadenopathy:    She has no cervical adenopathy.  Neurological: She is alert and oriented to person, place, and time.  Skin: Skin is warm and dry.  Psychiatric: She has a normal mood and affect. Her behavior is normal.       Results for orders placed or performed in visit on 04/12/15  POCT urinalysis dipstick  Result Value Ref Range   Color, UA yellow yellow   Clarity, UA clear clear   Glucose, UA negative negative   Bilirubin, UA negative negative   Ketones, POC UA negative negative   Spec Grav, UA 1.015    Blood, UA trace-lysed (A) negative   pH, UA 5.0    Protein Ur, POC negative negative   Urobilinogen, UA 0.2    Nitrite, UA Negative Negative   Leukocytes, UA small (1+) (A) Negative  POCT Microscopic Urinalysis ([facility])  Result Value Ref Range   WBC,UR,HPF,POC Few (A) None WBC/hpf   RBC,UR,HPF,POC Few (A) None RBC/hpf   Bacteria Few (A) None   Mucus Absent Absent   Epithelial Cells, UR Per Microscopy Few (A) None cells/hpf       Assessment & Plan:   1. Dysuria Treat for possible UTI pending culture results. Anticipatory guidance. Supportive care.  - POCT urinalysis dipstick - POCT Microscopic Urinalysis ([facility]) - Urine culture - sulfamethoxazole-trimethoprim (BACTRIM DS,SEPTRA DS) 800-160 MG per tablet; Take 1 tablet by mouth 2 (two) times daily.  Dispense: 10 tablet; Refill: 0   Fara Chute, PA-C Physician Assistant-Certified [facility]

## 2015-04-12 NOTE — Patient Instructions (Signed)
I will contact you with your lab results as soon as they are available.   If you have not heard from me in 2 weeks, please contact me.  The fastest way to get your results is to register for My Chart (see the instructions on the last page of this printout).   

## 2015-04-12 NOTE — Progress Notes (Signed)
Subjective:     Patient ID: [name], female   DOB: [birth year], 74 y.o.   MRN: [id number] PCP: Unice Cobble, MD   Chief Complaint  Patient presents with  . Dysuria    x 2 weeks  . Back Pain    HPI Patient is a 74 yo F who presents today for evaluation of dysuria x 2 weeks. She has been taking OTC Urostat but has not taken it yesterday or today as she did not want it to affect any lab results. She has had urinary frequency as well as urgency with some leakage. She had surgery for urinary leakage one year ago. No fever or chills. No hematuria. No vaginal discharge or vaginal pain. Sexually active with one partner. She is not concerned about potential STIs. She is having right lower back pain. No myalgias. Some lower abdominal pain, which she describes as sore.   Review of Systems  Constitutional: Negative for fever and chills.  Gastrointestinal: Negative for nausea, vomiting, abdominal pain, constipation and rectal pain.  Genitourinary: Positive for dysuria, urgency and frequency. Negative for vaginal discharge and vaginal pain.  Musculoskeletal: Positive for back pain. Negative for myalgias.  See HPI  Patient Active Problem List   Diagnosis Date Noted  . SUI (stress urinary incontinence, female) 02/05/2014  . 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 C V A/STROKE 05/31/2010  . DM (diabetes mellitus), secondary, uncontrolled, with renal complications 84/16/6063  . DYSURIA 01/24/2010  . THYROID NODULE, RIGHT 01/21/2009  . FATIGUE 07/12/2008  . Vitamin D deficiency 12/10/2007  . HYPERLIPIDEMIA 06/17/2007  . Essential hypertension 06/17/2007  . Osteopenia 06/17/2007  . Osteoarthrosis, unspecified whether generalized or localized, unspecified site 11/11/2006    Prior to Admission medications   Medication Sig Start Date End Date Taking? Authorizing Provider  aspirin 81 MG tablet Take 81 mg by mouth daily.   Yes Historical Provider, MD  benazepril (LOTENSIN) 20 MG tablet TAKE 1  TABLET DAILY 03/10/15  Yes Hendricks Limes, MD  calcium citrate-vitamin D 500-400 MG-UNIT chewable tablet Chew 1 tablet by mouth daily.   Yes Historical Provider, MD  Cholecalciferol (VITAMIN D3) 1000 UNITS CAPS Take 1 capsule by mouth daily.   Yes Historical Provider, MD  citalopram (CELEXA) 20 MG tablet TAKE 1 TABLET DAILY (NEED OFFICE VISIT BEFORE ANY FURTHER REFILLS) 03/16/15  Yes Hendricks Limes, MD  clonazePAM (KLONOPIN) 0.5 MG tablet TAKE 1 TABLET BY MOUTH AS NEEDED FOR ANXIETY 04/12/15  Yes Golden Circle, FNP  FREESTYLE LITE test strip USE TO CHECK BLOOD SUGAR DAILY AS DIRECTED 02/14/15  Yes Hendricks Limes, MD  glimepiride (AMARYL) 1 MG tablet TAKE 1 TABLET DAILY WITH BREAKFAST 01/17/15  Yes Hendricks Limes, MD  metFORMIN (GLUCOPHAGE) 1000 MG tablet TAKE 1 TABLET TWICE A DAY WITH MEALS 02/01/15  Yes Hendricks Limes, MD  methylcellulose (ARTIFICIAL TEARS) 1 % ophthalmic solution Place 1 drop into both eyes as needed.   Yes Historical Provider, MD  Multiple Vitamins-Calcium (ONE-A-DAY WOMENS FORMULA) TABS Take 1 tablet by mouth daily.   Yes Historical Provider, MD  pravastatin (PRAVACHOL) 40 MG tablet Take 1 tablet (40 mg total) by mouth at bedtime. --- No further refills until office visit is scheduled. 03/25/15  Yes Hendricks Limes, MD  amoxicillin-clavulanate (AUGMENTIN) 875-125 MG per tablet Take 1 tablet by mouth 2 (two) times daily. Patient not taking: Reported on 12/06/2014 07/31/14   Posey Boyer, MD  ciprofloxacin-hydrocortisone (CIPRO [facility]) otic suspension Use 3-5 drops twice  daily in right ear. Patient not taking: Reported on 12/06/2014 07/31/14   Posey Boyer, MD    Allergies  Allergen Reactions  . Conjugated Estrogens     REACTION: weight gain  & fibrocystic breast disease changes  . Percocet [Oxycodone-Acetaminophen] Itching  . Detrol [Tolterodine]     Rxed by [facility] Excessive drying     Objective:  Physical Exam  Constitutional: She is oriented to person,  place, and time. She appears well-developed and well-nourished.  HENT:  Head: Normocephalic and atraumatic.  Neck: Normal range of motion. Neck supple.  Cardiovascular: Normal rate and regular rhythm.   Pulmonary/Chest: Effort normal and breath sounds normal.  Abdominal: Soft. Bowel sounds are normal. She exhibits no distension and no mass. There is tenderness (Mild suprapubic and epigastric tenderness. Patient describes it as "sore."). There is no rebound and no guarding.  Musculoskeletal:       Arms: Lymphadenopathy:    She has no cervical adenopathy.  Neurological: She is alert and oriented to person, place, and time.  Skin: Skin is warm and dry.  Psychiatric: She has a normal mood and affect. Her behavior is normal. Thought content normal.     BP 126/62 mmHg  Pulse 81  Temp(Src) 97.8 F (36.6 C)  Resp 18  Ht 5' 5.5" (1.664 m)  Wt 186 lb (84.369 kg)  BMI 30.47 kg/m2  SpO2 97%   Results for orders placed or performed in visit on 04/12/15  POCT urinalysis dipstick  Result Value Ref Range   Color, UA yellow yellow   Clarity, UA clear clear   Glucose, UA negative negative   Bilirubin, UA negative negative   Ketones, POC UA negative negative   Spec Grav, UA 1.015    Blood, UA trace-lysed (A) negative   pH, UA 5.0    Protein Ur, POC negative negative   Urobilinogen, UA 0.2    Nitrite, UA Negative Negative   Leukocytes, UA small (1+) (A) Negative  POCT Microscopic Urinalysis ([facility])  Result Value Ref Range   WBC,UR,HPF,POC Few (A) None WBC/hpf   RBC,UR,HPF,POC Few (A) None RBC/hpf   Bacteria Few (A) None   Mucus Absent Absent   Epithelial Cells, UR Per Microscopy Few (A) None cells/hpf     Assessment & Plan:  1. Dysuria Dipstick/UA showed few WBCs, RBCs, and bacteria. Will treat for UTI while awaiting culture results. Patient counseled to RTC if she is not better or symptoms worsen within the next 48 hours. - POCT urinalysis dipstick - POCT Microscopic Urinalysis  ([facility]) - Urine culture - sulfamethoxazole-trimethoprim (BACTRIM DS,SEPTRA DS) 800-160 MG per tablet; Take 1 tablet by mouth 2 (two) times daily.  Dispense: 10 tablet; Refill: 0   Amber D. Race, PA-S Physician Assistant Student [facility]

## 2015-04-14 LAB — URINE CULTURE

## 2015-04-25 DIAGNOSIS — H43811 Vitreous degeneration, right eye: Secondary | ICD-10-CM | POA: Diagnosis not present

## 2015-04-25 DIAGNOSIS — H35341 Macular cyst, hole, or pseudohole, right eye: Secondary | ICD-10-CM | POA: Diagnosis not present

## 2015-04-25 DIAGNOSIS — H35371 Puckering of macula, right eye: Secondary | ICD-10-CM | POA: Diagnosis not present

## 2015-05-01 ENCOUNTER — Other Ambulatory Visit: Payer: Self-pay | Admitting: Internal Medicine

## 2015-05-13 ENCOUNTER — Other Ambulatory Visit: Payer: Self-pay | Admitting: Family

## 2015-05-20 ENCOUNTER — Telehealth: Payer: Self-pay | Admitting: Family Medicine

## 2015-05-20 NOTE — Telephone Encounter (Signed)
SPOKE WITH PATIENT AND SHE SEES DR Gwyndolyn Saxon [facility] AS HER PCP.  SHE ONLY COMES TO [place] FOR ACUTE CARE VISITS.

## 2015-06-08 ENCOUNTER — Other Ambulatory Visit: Payer: Self-pay | Admitting: Internal Medicine

## 2015-06-12 ENCOUNTER — Other Ambulatory Visit: Payer: Self-pay | Admitting: Internal Medicine

## 2015-06-14 ENCOUNTER — Other Ambulatory Visit: Payer: Self-pay | Admitting: Family

## 2015-06-20 ENCOUNTER — Telehealth: Payer: Self-pay | Admitting: Family

## 2015-06-20 ENCOUNTER — Encounter: Payer: Self-pay | Admitting: Family

## 2015-06-20 ENCOUNTER — Other Ambulatory Visit (INDEPENDENT_AMBULATORY_CARE_PROVIDER_SITE_OTHER): Payer: Medicare Other

## 2015-06-20 ENCOUNTER — Ambulatory Visit (INDEPENDENT_AMBULATORY_CARE_PROVIDER_SITE_OTHER): Payer: Medicare Other | Admitting: Family

## 2015-06-20 VITALS — BP 138/72 | HR 65 | Temp 97.8°F | Resp 18 | Ht 65.5 in | Wt 187.4 lb

## 2015-06-20 DIAGNOSIS — I6522 Occlusion and stenosis of left carotid artery: Secondary | ICD-10-CM

## 2015-06-20 DIAGNOSIS — F411 Generalized anxiety disorder: Secondary | ICD-10-CM | POA: Diagnosis not present

## 2015-06-20 DIAGNOSIS — E1329 Other specified diabetes mellitus with other diabetic kidney complication: Secondary | ICD-10-CM

## 2015-06-20 DIAGNOSIS — IMO0002 Reserved for concepts with insufficient information to code with codable children: Secondary | ICD-10-CM

## 2015-06-20 DIAGNOSIS — R300 Dysuria: Secondary | ICD-10-CM | POA: Diagnosis not present

## 2015-06-20 DIAGNOSIS — F419 Anxiety disorder, unspecified: Secondary | ICD-10-CM | POA: Insufficient documentation

## 2015-06-20 DIAGNOSIS — E1365 Other specified diabetes mellitus with hyperglycemia: Secondary | ICD-10-CM

## 2015-06-20 LAB — POCT URINALYSIS DIPSTICK
Bilirubin, UA: NEGATIVE
GLUCOSE UA: NEGATIVE
Ketones, UA: NEGATIVE
Leukocytes, UA: NEGATIVE
NITRITE UA: NEGATIVE
Protein, UA: 6
RBC UA: NEGATIVE
Spec Grav, UA: >=1.03
UROBILINOGEN UA: NEGATIVE
pH, UA: 6

## 2015-06-20 LAB — HEMOGLOBIN A1C: HEMOGLOBIN A1C: 7.7 % — AB (ref 4.6–6.5)

## 2015-06-20 MED ORDER — FREESTYLE LITE DEVI: Status: DC

## 2015-06-20 MED ORDER — CLONAZEPAM 0.5 MG PO TABS: ORAL_TABLET | ORAL | Status: DC

## 2015-06-20 NOTE — Telephone Encounter (Signed)
Please inform patient that her A1c is 7.7 indicating a worsening control of her diabetes. Therefore please continue to take the medications as prescribed and follow up with Dr. Quay Burow as scheduled.

## 2015-06-20 NOTE — Assessment & Plan Note (Signed)
Requests new meter. Freestyle Lite sent to pharmacy per patient request. Obtain A1c. Continue current dosage of medications pending A1c results and establishing care with new PCP.

## 2015-06-20 NOTE — Progress Notes (Signed)
Subjective:    Patient ID: [name], female    DOB: 12-22-1940, 74 y.o.   MRN: [id number]  Chief Complaint  Patient presents with  . Medication follow up    needs refill of klonopin, having sxs of UTI    HPI:  [name] is a 74 y.o. female who  has a past medical history of Hyperlipidemia; Hypertension; Depression; Osteoporosis; Thyroid disease; DVT (deep venous thrombosis) ([place]) (2006); Obesity; MRSA (methicillin resistant staph aureus) culture positive; History of CVA (cerebrovascular accident) (NEUROLOGIST  DR WILLIS----  06-01-2010-  LEFT BASAL GANGLIA HEMORRAGE); Numbness and tingling in right hand (RESIDUAL FROM CVA NOV 2011); Diabetes mellitus ([facility]); Anxiety; Asymptomatic carotid artery stenosis (LEFT --  MOD. PER DR WILLIS NOTE OCT 2012); Benign heart murmur; Nocturia; Frequency of urination; and Arthritis (HANDS, NECK , BACK). and presents today for a follow-up office visit.   1.) Dysuria - Associated symptoms of dysuria has been going on for a couple of weeks. Also expresses frequency and urgency. Denies any fevers, chills or hematuria. Notes that her symptoms have improved over the last couple day.  2.) Anxiety - Currently maintained on klonopin. Takes the medication as prescribed and primarily uses to help calm her mind to allow her to sleep. Notes that she averages approximately 6 hours sleep per night with the medication. Does not take medication often during the day.  Allergies  Allergen Reactions  . Conjugated Estrogens     REACTION: weight gain  & fibrocystic breast disease changes  . Percocet [Oxycodone-Acetaminophen] Itching  . Detrol [Tolterodine]     Rxed by [facility] Excessive drying     Current Outpatient Prescriptions on File Prior to Visit  Medication Sig Dispense Refill  . aspirin 81 MG tablet Take 81 mg by mouth daily.    . benazepril (LOTENSIN) 20 MG tablet TAKE 1 TABLET DAILY 90 tablet 0  . calcium citrate-vitamin D 500-400 MG-UNIT  chewable tablet Chew 1 tablet by mouth daily.    . Cholecalciferol (VITAMIN D3) 1000 UNITS CAPS Take 1 capsule by mouth daily.    . citalopram (CELEXA) 20 MG tablet TAKE 1 TABLET DAILY (NEED OFFICE VISIT BEFORE ANY FURTHER REFILLS) 90 tablet 0  . FREESTYLE LITE test strip USE TO CHECK BLOOD SUGAR DAILY AS DIRECTED 100 each 2  . glimepiride (AMARYL) 1 MG tablet TAKE 1 TABLET DAILY WITH BREAKFAST 90 tablet 1  . metFORMIN (GLUCOPHAGE) 1000 MG tablet TAKE 1 TABLET TWICE A DAY WITH MEALS 180 tablet 0  . methylcellulose (ARTIFICIAL TEARS) 1 % ophthalmic solution Place 1 drop into both eyes as needed.    . Multiple Vitamins-Calcium (ONE-A-DAY WOMENS FORMULA) TABS Take 1 tablet by mouth daily.    . pravastatin (PRAVACHOL) 40 MG tablet Take 1 tablet (40 mg total) by mouth at bedtime. --- No further refills until office visit is scheduled. 90 tablet 0   No current facility-administered medications on file prior to visit.     Review of Systems  Constitutional: Negative for fever and chills.  Eyes:       Negative for changes in vision  Respiratory: Negative for chest tightness and shortness of breath.   Cardiovascular: Negative for chest pain, palpitations and leg swelling.  Endocrine: Negative for polydipsia, polyphagia and polyuria.  Genitourinary: Positive for dysuria, urgency and frequency. Negative for hematuria and flank pain.  Neurological: Negative for weakness and numbness.  Psychiatric/Behavioral: Negative for sleep disturbance and dysphoric mood.      Objective:  BP 138/72 mmHg  Pulse 65  Temp(Src) 97.8 F (36.6 C) (Oral)  Resp 18  Ht 5' 5.5" (1.664 m)  Wt 187 lb 6.4 oz (85.004 kg)  BMI 30.70 kg/m2  SpO2 93% Nursing note and vital signs reviewed.  Physical Exam  Constitutional: She is oriented to person, place, and time. She appears well-developed and well-nourished. No distress.  Cardiovascular: Normal rate, regular rhythm, normal heart sounds and intact distal pulses.     Pulmonary/Chest: Effort normal and breath sounds normal.  Abdominal: There is no CVA tenderness.  Neurological: She is alert and oriented to person, place, and time.  Skin: Skin is warm and dry.  Psychiatric: She has a normal mood and affect. Her behavior is normal. Judgment and thought content normal.       Assessment & Plan:   Problem List Items Addressed This Visit      Endocrine   DM (diabetes mellitus), secondary, uncontrolled, with renal complications ([place])    Requests new meter. Freestyle Lite sent to pharmacy per patient request. Obtain A1c. Continue current dosage of medications pending A1c results and establishing care with new PCP.       Relevant Medications   Blood Glucose Monitoring Suppl (FREESTYLE LITE) DEVI   Other Relevant Orders   Hemoglobin A1c     Other   DYSURIA - Primary    Self-resolving symptoms of dysuria within the past 2-3 days. In office UA negative for leukocytes, nitrites and hematuria. Urine sent for culture. Recommend watchful waiting pending urine culture results. Patient will report back if symptoms redevelop prior to urine culture results.       Relevant Orders   POCT urinalysis dipstick (Completed)   Urine culture   Generalized anxiety disorder    Stable with current dose of clonazepam. Denies adverse side effects. Obtaining average amount of sleep with medication. Continue current dosage of clonazepam.       Relevant Medications   clonazePAM (KLONOPIN) 0.5 MG tablet

## 2015-06-20 NOTE — Progress Notes (Signed)
Pre visit review using our clinic review tool, if applicable. No additional management support is needed unless otherwise documented below in the visit note. 

## 2015-06-20 NOTE — Assessment & Plan Note (Signed)
Self-resolving symptoms of dysuria within the past 2-3 days. In office UA negative for leukocytes, nitrites and hematuria. Urine sent for culture. Recommend watchful waiting pending urine culture results. Patient will report back if symptoms redevelop prior to urine culture results.

## 2015-06-20 NOTE — Assessment & Plan Note (Signed)
Stable with current dose of clonazepam. Denies adverse side effects. Obtaining average amount of sleep with medication. Continue current dosage of clonazepam.

## 2015-06-20 NOTE — Patient Instructions (Signed)
Thank you for choosing [facility].  Summary/Instructions:  Please continue to take your medications as prescribed.   Your prescription(s) have been submitted to your pharmacy or been printed and provided for you. Please take as directed and contact our office if you believe you are having problem(s) with the medication(s) or have any questions.  Please stop by the lab on the basement level of the building for your blood work. Your results will be released to MyChart (or called to you) after review, usually within 72 hours after test completion. If any changes need to be made, you will be notified at that same time.   

## 2015-06-21 ENCOUNTER — Telehealth: Payer: Self-pay | Admitting: Family

## 2015-06-21 LAB — URINE CULTURE
Colony Count: NO GROWTH
Organism ID, Bacteria: NO GROWTH

## 2015-06-21 NOTE — Telephone Encounter (Signed)
Please inform patient that the urine culture showed no growth indicating that a urinary tract infection was not present. If she continues to experience symptoms or if symptoms return, please let us know.

## 2015-06-21 NOTE — Telephone Encounter (Signed)
Pt informed

## 2015-06-21 NOTE — Telephone Encounter (Signed)
LVM for pt to call back.

## 2015-06-22 ENCOUNTER — Other Ambulatory Visit: Payer: Self-pay | Admitting: Internal Medicine

## 2015-06-24 NOTE — Telephone Encounter (Signed)
Pt aware.

## 2015-06-29 ENCOUNTER — Ambulatory Visit (INDEPENDENT_AMBULATORY_CARE_PROVIDER_SITE_OTHER): Payer: Medicare Other | Admitting: Internal Medicine

## 2015-06-29 ENCOUNTER — Encounter: Payer: Self-pay | Admitting: Internal Medicine

## 2015-06-29 VITALS — BP 120/64 | HR 66 | Temp 98.0°F | Resp 16 | Wt 188.0 lb

## 2015-06-29 DIAGNOSIS — E782 Mixed hyperlipidemia: Secondary | ICD-10-CM | POA: Diagnosis not present

## 2015-06-29 DIAGNOSIS — I10 Essential (primary) hypertension: Secondary | ICD-10-CM

## 2015-06-29 DIAGNOSIS — E1329 Other specified diabetes mellitus with other diabetic kidney complication: Secondary | ICD-10-CM

## 2015-06-29 DIAGNOSIS — H60391 Other infective otitis externa, right ear: Secondary | ICD-10-CM | POA: Diagnosis not present

## 2015-06-29 DIAGNOSIS — I6522 Occlusion and stenosis of left carotid artery: Secondary | ICD-10-CM | POA: Diagnosis not present

## 2015-06-29 DIAGNOSIS — E1365 Other specified diabetes mellitus with hyperglycemia: Secondary | ICD-10-CM

## 2015-06-29 DIAGNOSIS — F411 Generalized anxiety disorder: Secondary | ICD-10-CM

## 2015-06-29 DIAGNOSIS — IMO0002 Reserved for concepts with insufficient information to code with codable children: Secondary | ICD-10-CM

## 2015-06-29 MED ORDER — PRAVASTATIN SODIUM 40 MG PO TABS: 40.0000 mg | ORAL_TABLET | Freq: Every day | ORAL | Status: DC

## 2015-06-29 MED ORDER — CITALOPRAM HYDROBROMIDE 20 MG PO TABS: 20.0000 mg | ORAL_TABLET | Freq: Every day | ORAL | Status: DC

## 2015-06-29 MED ORDER — CIPROFLOXACIN-DEXAMETHASONE 0.3-0.1 % OT SUSP: 4.0000 [drp] | Freq: Two times a day (BID) | OTIC | Status: DC

## 2015-06-29 MED ORDER — BENAZEPRIL HCL 20 MG PO TABS: 20.0000 mg | ORAL_TABLET | Freq: Every day | ORAL | Status: DC

## 2015-06-29 MED ORDER — GLIMEPIRIDE 1 MG PO TABS: 1.0000 mg | ORAL_TABLET | Freq: Every day | ORAL | Status: DC

## 2015-06-29 MED ORDER — METFORMIN HCL 1000 MG PO TABS: ORAL_TABLET | ORAL | Status: DC

## 2015-06-29 NOTE — Assessment & Plan Note (Signed)
Recent A1c 7.7%. Goal less than 7% She is exercising and trying to increase her exercise, but is not sure how feasible that is Improve diet-Better compliance with a diabetic diet if possible Will increase metformin to 2500 mg per day Continue glimepiride 1 mg daily Eye exam up to date Follow-up in 3 months

## 2015-06-29 NOTE — Assessment & Plan Note (Signed)
Overall controlled Continue citalopram 20 mg daily Has been on clonazepam at night for years-takes as prescribed and denies any side effects. Will not be able to sleep without it so we will continue

## 2015-06-29 NOTE — Progress Notes (Signed)
Subjective:    Patient ID: [name], female    DOB: Jun 14, 1941, 74 y.o.   MRN: [id number]  HPI She is here to establish with a new pcp. She has no concerns.  Diabetes: She is taking her medication daily as prescribed. She is somewhat compliant with a diabetic diet. She is exercising regularly - water aerobics three times a week, walks. She monitors her sugars and they have been running over 200 in morning, during day 130-140. She checks her feet daily and denies foot lesions. She is up-to-date with an ophthalmology examination.   Hypertension: She is taking her medication daily. She is compliant with a low sodium diet.  She denies chest pain, palpitations, edema, shortness of breath and regular headaches. She is exercising regularly.  She does not monitor her blood pressure at home.    Anxiety associated with insomnia:  She takes clonazepam nightly.  She has ben on it for years and works well. She denies any side effects. She takes the medication as prescribed.    Hyperlipidemia: She is taking her medication daily. She is compliant with a low fat/cholesterol diet. She is exercising regularly. She denies myalgias.   Right ear feels like there is moderate times. She describes slightly decreased hearing. She denies any pain. She did have an ear infection a few months ago and ended up seeing ENT because antibiotic ear drops did not help. She is still feeling similar symptoms.  Anxiety: She is taking her medication daily as prescribed. She denies any side effects from the medication. She feels her anxiety is well controlled and she is happy with her current dose of medication.    Medications and allergies reviewed with patient and updated if appropriate.  Patient Active Problem List   Diagnosis Date Noted  . Generalized anxiety disorder 06/20/2015  . SUI (stress urinary incontinence, female) 02/05/2014  . 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 C V A/STROKE 05/31/2010  . DM (diabetes  mellitus), secondary, uncontrolled, with renal complications ([place]) [id number]  . DYSURIA 01/24/2010  . THYROID NODULE, RIGHT 01/21/2009  . FATIGUE 07/12/2008  . Vitamin D deficiency 12/10/2007  . HYPERLIPIDEMIA 06/17/2007  . Essential hypertension 06/17/2007  . Osteopenia 06/17/2007  . Osteoarthrosis, unspecified whether generalized or localized, unspecified site 11/11/2006    Current Outpatient Prescriptions on File Prior to Visit  Medication Sig Dispense Refill  . aspirin 81 MG tablet Take 81 mg by mouth daily.    . benazepril (LOTENSIN) 20 MG tablet TAKE 1 TABLET DAILY 90 tablet 0  . Blood Glucose Monitoring Suppl (FREESTYLE LITE) DEVI Use meter daily to check blood sugar as instructed. 1 each 0  . calcium citrate-vitamin D 500-400 MG-UNIT chewable tablet Chew 1 tablet by mouth daily.    . Cholecalciferol (VITAMIN D3) 1000 UNITS CAPS Take 1 capsule by mouth daily.    . citalopram (CELEXA) 20 MG tablet TAKE 1 TABLET DAILY (NEED OFFICE VISIT BEFORE ANY FURTHER REFILLS) 90 tablet 0  . clonazePAM (KLONOPIN) 0.5 MG tablet TAKE 1 TABLET BY MOUTH EVERY DAY AS NEEDED ANIXETY 30 tablet 1  . FREESTYLE LITE test strip USE TO CHECK BLOOD SUGAR DAILY AS DIRECTED 100 each 2  . glimepiride (AMARYL) 1 MG tablet TAKE 1 TABLET DAILY WITH BREAKFAST 90 tablet 1  . metFORMIN (GLUCOPHAGE) 1000 MG tablet TAKE 1 TABLET TWICE A DAY WITH MEALS 180 tablet 0  . methylcellulose (ARTIFICIAL TEARS) 1 % ophthalmic solution Place 1 drop into both eyes as needed.    Marland Kitchen  Multiple Vitamins-Calcium (ONE-A-DAY WOMENS FORMULA) TABS Take 1 tablet by mouth daily.    . pravastatin (PRAVACHOL) 40 MG tablet Take 1 tablet (40 mg total) by mouth at bedtime. --- No further refills until office visit is scheduled. 90 tablet 0   No current facility-administered medications on file prior to visit.    Past Medical History  Diagnosis Date  . Hyperlipidemia   . Hypertension   . Depression     PMH of  . Osteoporosis   . Thyroid  disease     right thyroid nodule-- BX DONE 08-15-2011  . DVT (deep venous thrombosis) ([place]) 2006    after arthroscopy  . Obesity     Redux therapy  . MRSA (methicillin resistant staph aureus) culture positive     X3; last post TKR  . History of CVA (cerebrovascular accident) NEUROLOGIST  DR WILLIS----  06-01-2010-  LEFT BASAL GANGLIA HEMORRAGE    RESIDUAL RIGHT HAND NUMBNESS/TINGLING  . Numbness and tingling in right hand RESIDUAL FROM CVA NOV 2011  . Diabetes mellitus ORAL MED  . Anxiety   . Asymptomatic carotid artery stenosis LEFT --  MOD. PER DR WILLIS NOTE OCT 2012  . Benign heart murmur   . Nocturia   . Frequency of urination   . Arthritis HANDS, NECK , BACK    Past Surgical History  Procedure Laterality Date  . Carpal tunnel release  2000    RIGHT  . G2 p2    . Knee arthroscopy      BILATERAL PRIOR TO TOTAL KNEE  . Total knee arthroplasty  05-26-2002       LEFT   AND RIGHT  03-30-2098  . Lumbar laminectomy  08-26-2009    L 4 - 5  . Cystoscope      to evaluate recurrent UTIs  . Colonoscopy  1999 & 2005    Dr Olevia Perches  . Abdominal hysterectomy  1973    Endometriosis & fibroid  . Left shoulder surg  1990'S  . Joint replacement  03-28-2006    LEFT THUMB  . Thyroid nodule bx  08-15-2011    RIGHT  . Cystoscopy with biopsy  08/22/2011    Procedure: CYSTOSCOPY WITH BIOPSY;  Surgeon: Molli Hazard, MD;  Location: [facility];  Service: Urology;  Laterality: N/A;  . Cystoscopy w/ retrogrades  08/22/2011    Procedure: CYSTOSCOPY WITH RETROGRADE PYELOGRAM;  Surgeon: Molli Hazard, MD;  Location: [facility];  Service: Urology;  Laterality: Bilateral;  . Wrist surgery  2014    tendon placed in joint; Dr Burney Gauze    Social History   Social History  . Marital Status: Widowed    Spouse Name: N/A  . Number of Children: N/A  . Years of Education: N/A   Social History Main Topics  . Smoking status: Never Smoker   . Smokeless  tobacco: Never Used  . Alcohol Use: Yes     Comment:  very rarely , < 1 glass wine / month  . Drug Use: No  . Sexual Activity: Not on file   Other Topics Concern  . Not on file   Social History Narrative    Review of Systems  Constitutional: Negative for fever and chills.  Respiratory: Negative for cough, shortness of breath and wheezing.   Cardiovascular: Negative for chest pain, palpitations and leg swelling.  Gastrointestinal: Negative for nausea and abdominal pain.       Occ GERD  Neurological: Positive for numbness (right finger  tips). Negative for dizziness, weakness, light-headedness and headaches.       Occasional off balance since stroke  Psychiatric/Behavioral: Negative for dysphoric mood. The patient is not nervous/anxious.        Objective:   Filed Vitals:   06/29/15 1535  BP: 120/64  Pulse: 66  Temp: 98 F (36.7 C)  Resp: 16   Filed Weights   06/29/15 1535  Weight: 188 lb (85.276 kg)   Body mass index is 30.8 kg/(m^2).   Physical Exam  Constitutional: She appears well-developed and well-nourished. No distress.  HENT:  Head: Normocephalic and atraumatic.  Right Ear: External ear normal.  Left Ear: External ear normal.  Left ear canal and tympanic membrane normal, right ear canal with white exudate and slight swelling/erythema, tympanic membrane normal  Eyes: Conjunctivae are normal.  Neck: Neck supple. No tracheal deviation present. No thyromegaly present.  No carotid bruit  Cardiovascular: Normal rate, regular rhythm and normal heart sounds.   No murmur heard. Pulmonary/Chest: Effort normal and breath sounds normal. No respiratory distress. She has no wheezes. She has no rales.  Musculoskeletal: She exhibits no edema.  Lymphadenopathy:    She has no cervical adenopathy.  Skin: Skin is warm and dry.  Psychiatric: She has a normal mood and affect. Her behavior is normal.          Assessment & Plan:   Right otitis externa Ciprodex  prescription sent to pharmacy Call or return if no improvement  See problem list for further assessment and plan  Follow-up in 3 months-repeat blood work at that time to recheck A1c

## 2015-06-29 NOTE — Patient Instructions (Addendum)
  We have reviewed your prior records including labs and tests today.  All other Health Maintenance issues reviewed.   All recommended immunizations and age-appropriate screenings are up-to-date.  Medications reviewed and updated.  Increase the metformin - add a half pill at lunch.  I have prescribed a antibiotic ear drop for your infection.  Your prescription(s) have been submitted to your pharmacy. Please take as directed and contact our office if you believe you are having problem(s) with the medication(s).  Please schedule followup in 3 months

## 2015-06-29 NOTE — Progress Notes (Signed)
Pre visit review using our clinic review tool, if applicable. No additional management support is needed unless otherwise documented below in the visit note. 

## 2015-06-29 NOTE — Assessment & Plan Note (Signed)
Blood pressure well controlled Continue current medication 

## 2015-06-29 NOTE — Assessment & Plan Note (Signed)
Last lipid panel well controlled Increase exercise if possible Continue pravastatin 40 mg daily

## 2015-07-26 NOTE — Telephone Encounter (Signed)
ERROR

## 2015-08-16 ENCOUNTER — Other Ambulatory Visit: Payer: Self-pay | Admitting: Family

## 2015-08-16 NOTE — Telephone Encounter (Signed)
Please advise on refill. Last refill was 11/28

## 2015-08-26 DIAGNOSIS — N390 Urinary tract infection, site not specified: Secondary | ICD-10-CM | POA: Diagnosis not present

## 2015-08-26 DIAGNOSIS — N393 Stress incontinence (female) (male): Secondary | ICD-10-CM | POA: Diagnosis not present

## 2015-09-08 DIAGNOSIS — Z6830 Body mass index (BMI) 30.0-30.9, adult: Secondary | ICD-10-CM | POA: Diagnosis not present

## 2015-09-08 DIAGNOSIS — Z01419 Encounter for gynecological examination (general) (routine) without abnormal findings: Secondary | ICD-10-CM | POA: Diagnosis not present

## 2015-09-09 DIAGNOSIS — Z1231 Encounter for screening mammogram for malignant neoplasm of breast: Secondary | ICD-10-CM | POA: Diagnosis not present

## 2015-09-09 LAB — HM MAMMOGRAPHY: HM Mammogram: NEGATIVE

## 2015-09-12 DIAGNOSIS — T83721A Exposure of implanted vaginal mesh and other prosthetic materials into vagina, initial encounter: Secondary | ICD-10-CM | POA: Diagnosis not present

## 2015-09-12 DIAGNOSIS — N390 Urinary tract infection, site not specified: Secondary | ICD-10-CM | POA: Diagnosis not present

## 2015-09-12 DIAGNOSIS — N393 Stress incontinence (female) (male): Secondary | ICD-10-CM | POA: Diagnosis not present

## 2015-09-19 ENCOUNTER — Other Ambulatory Visit: Payer: Self-pay | Admitting: Internal Medicine

## 2015-09-19 NOTE — Telephone Encounter (Signed)
RX faxed to pharm  

## 2015-09-19 NOTE — Telephone Encounter (Signed)
Last OV 12/07, last refill 08/16/15

## 2015-09-27 ENCOUNTER — Encounter: Payer: Self-pay | Admitting: Internal Medicine

## 2015-09-27 ENCOUNTER — Ambulatory Visit (INDEPENDENT_AMBULATORY_CARE_PROVIDER_SITE_OTHER): Payer: Medicare Other | Admitting: Internal Medicine

## 2015-09-27 VITALS — BP 122/80 | HR 68 | Temp 98.1°F | Resp 16 | Wt 186.0 lb

## 2015-09-27 DIAGNOSIS — IMO0002 Reserved for concepts with insufficient information to code with codable children: Secondary | ICD-10-CM

## 2015-09-27 DIAGNOSIS — E1365 Other specified diabetes mellitus with hyperglycemia: Secondary | ICD-10-CM | POA: Diagnosis not present

## 2015-09-27 DIAGNOSIS — E782 Mixed hyperlipidemia: Secondary | ICD-10-CM

## 2015-09-27 DIAGNOSIS — E1329 Other specified diabetes mellitus with other diabetic kidney complication: Secondary | ICD-10-CM | POA: Diagnosis not present

## 2015-09-27 DIAGNOSIS — I10 Essential (primary) hypertension: Secondary | ICD-10-CM | POA: Diagnosis not present

## 2015-09-27 DIAGNOSIS — Z0000 Encounter for general adult medical examination without abnormal findings: Secondary | ICD-10-CM

## 2015-09-27 MED ORDER — GLIMEPIRIDE 2 MG PO TABS: 2.0000 mg | ORAL_TABLET | Freq: Every day | ORAL | Status: DC

## 2015-09-27 NOTE — Patient Instructions (Addendum)
Have your blood work done one week prior to your physical in 6 months.    Medications reviewed and updated.  Changes include increasing your glimepiride to 2 mg daily.  Your prescription(s) have been submitted to your pharmacy. Please take as directed and contact our office if you believe you are having problem(s) with the medication(s).   Please followup in 6 months

## 2015-09-27 NOTE — Progress Notes (Signed)
Pre visit review using our clinic review tool, if applicable. No additional management support is needed unless otherwise documented below in the visit note. 

## 2015-09-27 NOTE — Assessment & Plan Note (Signed)
Lipid panel controlled Continue pravastatin

## 2015-09-27 NOTE — Assessment & Plan Note (Signed)
Sugars still high at home Continue healthy diet and regular exercise Increase glimepiride to 2 mg daily Continue metformin at current dose

## 2015-09-27 NOTE — Assessment & Plan Note (Signed)
BP controlled Continue current medication  

## 2015-09-27 NOTE — Progress Notes (Signed)
Subjective:    Patient ID: [name], female    DOB: 10/10/1940, 75 y.o.   MRN: [id number]  HPI She is here for a 3 month follow up.   Diabetes: She is taking her medication daily as prescribed. We increased the metformin to 2500 mg daily. She is compliant with a diabetic diet. She is exercising regularly -water aerobics three times a day.. She monitors her sugars and they have been running 180-190 in the evening and lower in the day.   Hypertension: She is taking her medication daily. She is compliant with a low sodium diet.  She denies chest pain, palpitations, edema, shortness of breath and lightheadedness. She is exercising regularly.     Hyperlipidemia: She is taking her medication daily. She is compliant with a low fat/cholesterol diet. She is exercising regularly.     Medications and allergies reviewed with patient and updated if appropriate.  Patient Active Problem List   Diagnosis Date Noted  . Generalized anxiety disorder 06/20/2015  . SUI (stress urinary incontinence, female) 02/05/2014  . 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 C V A/STROKE 05/31/2010  . DM (diabetes mellitus), secondary, uncontrolled, with renal complications ([place]) [id number]  . DYSURIA 01/24/2010  . THYROID NODULE, RIGHT 01/21/2009  . FATIGUE 07/12/2008  . Vitamin D deficiency 12/10/2007  . HYPERLIPIDEMIA 06/17/2007  . Essential hypertension 06/17/2007  . Osteopenia 06/17/2007  . Osteoarthrosis, unspecified whether generalized or localized, unspecified site 11/11/2006    Current Outpatient Prescriptions on File Prior to Visit  Medication Sig Dispense Refill  . aspirin 81 MG tablet Take 81 mg by mouth daily.    . benazepril (LOTENSIN) 20 MG tablet Take 1 tablet (20 mg total) by mouth daily. 90 tablet 1  . Blood Glucose Monitoring Suppl (FREESTYLE LITE) DEVI Use meter daily to check blood sugar as instructed. 1 each 0  . calcium citrate-vitamin D 500-400 MG-UNIT chewable tablet Chew 1 tablet  by mouth daily.    . Cholecalciferol (VITAMIN D3) 1000 UNITS CAPS Take 1 capsule by mouth daily.    . citalopram (CELEXA) 20 MG tablet Take 1 tablet (20 mg total) by mouth daily. 90 tablet 1  . clonazePAM (KLONOPIN) 0.5 MG tablet TAKE ONE TABLET BY MOUTH ONCE DAILY AS NEEDED FOR ANXIETY 30 tablet 0  . CRANBERRY PO Take 300 mg by mouth daily.    . cycloSPORINE (RESTASIS) 0.05 % ophthalmic emulsion Place 1 drop into both eyes 2 (two) times daily.    Marland Kitchen FREESTYLE LITE test strip USE TO CHECK BLOOD SUGAR DAILY AS DIRECTED 100 each 2  . metFORMIN (GLUCOPHAGE) 1000 MG tablet Take 1 tab with breakfast, 1/2 tab with lunch and 1 tab with dinner 225 tablet 1  . methylcellulose (ARTIFICIAL TEARS) 1 % ophthalmic solution Place 1 drop into both eyes as needed.    . Multiple Vitamins-Calcium (ONE-A-DAY WOMENS FORMULA) TABS Take 1 tablet by mouth daily.    . pravastatin (PRAVACHOL) 40 MG tablet Take 1 tablet (40 mg total) by mouth at bedtime. 90 tablet 1   No current facility-administered medications on file prior to visit.    Past Medical History  Diagnosis Date  . Hyperlipidemia   . Hypertension   . Depression     PMH of  . Osteoporosis   . Thyroid disease     right thyroid nodule-- BX DONE 08-15-2011  . DVT (deep venous thrombosis) ([place]) 2006    after arthroscopy  . Obesity     Redux therapy  .  MRSA (methicillin resistant staph aureus) culture positive     X3; last post TKR  . History of CVA (cerebrovascular accident) NEUROLOGIST  DR WILLIS----  06-01-2010-  LEFT BASAL GANGLIA HEMORRAGE    RESIDUAL RIGHT HAND NUMBNESS/TINGLING  . Numbness and tingling in right hand RESIDUAL FROM CVA NOV 2011  . Diabetes mellitus ORAL MED  . Anxiety   . Asymptomatic carotid artery stenosis LEFT --  MOD. PER DR WILLIS NOTE OCT 2012  . Benign heart murmur   . Nocturia   . Frequency of urination   . Arthritis HANDS, NECK , BACK    Past Surgical History  Procedure Laterality Date  . Carpal tunnel release   2000    RIGHT  . G2 p2    . Knee arthroscopy      BILATERAL PRIOR TO TOTAL KNEE  . Total knee arthroplasty  05-26-2002       LEFT   AND RIGHT  03-30-2098  . Lumbar laminectomy  08-26-2009    L 4 - 5  . Cystoscope      to evaluate recurrent UTIs  . Colonoscopy  1999 & 2005    Dr Olevia Perches  . Abdominal hysterectomy  1973    Endometriosis & fibroid  . Left shoulder surg  1990'S  . Joint replacement  03-28-2006    LEFT THUMB  . Thyroid nodule bx  08-15-2011    RIGHT  . Cystoscopy with biopsy  08/22/2011    Procedure: CYSTOSCOPY WITH BIOPSY;  Surgeon: Molli Hazard, MD;  Location: [facility];  Service: Urology;  Laterality: N/A;  . Cystoscopy w/ retrogrades  08/22/2011    Procedure: CYSTOSCOPY WITH RETROGRADE PYELOGRAM;  Surgeon: Molli Hazard, MD;  Location: [facility];  Service: Urology;  Laterality: Bilateral;  . Wrist surgery  2014    tendon placed in joint; Dr Burney Gauze    Social History   Social History  . Marital Status: Widowed    Spouse Name: N/A  . Number of Children: N/A  . Years of Education: N/A   Social History Main Topics  . Smoking status: Never Smoker   . Smokeless tobacco: Never Used  . Alcohol Use: Yes     Comment:  very rarely , < 1 glass wine / month  . Drug Use: No  . Sexual Activity: Not on file   Other Topics Concern  . Not on file   Social History Narrative    Family History  Problem Relation Age of Onset  . Bladder Cancer Mother   . Diabetes Mother   . Brain cancer Sister   . Prostate cancer Brother   . Alzheimer's disease Brother   . Diabetes Maternal Aunt   . Diabetes Maternal Uncle   . Diabetes Maternal Grandmother     Type 2  . Diabetes Maternal Grandfather     Type 2  . Stroke Paternal Grandfather     in 1s  . Diabetes Sister 55    TYPE 1   . Pancreatic cancer Sister   . Kidney failure Sister     in context of DM & influenza  . Heart disease Neg Hx     Review of Systems    Constitutional: Negative for fever.  HENT: Positive for rhinorrhea. Negative for congestion.   Respiratory: Negative for apnea, cough, shortness of breath and wheezing.   Cardiovascular: Negative for chest pain, palpitations and leg swelling.  Neurological: Positive for headaches. Negative for dizziness and light-headedness.  Objective:   Filed Vitals:   09/27/15 1343  BP: 122/80  Pulse: 68  Temp: 98.1 F (36.7 C)  Resp: 16   Filed Weights   09/27/15 1343  Weight: 186 lb (84.369 kg)   Body mass index is 30.47 kg/(m^2).   Physical Exam Constitutional: Appears well-developed and well-nourished. No distress.  Neck: Neck supple. No tracheal deviation present. No thyromegaly present.  No carotid bruit. No cervical adenopathy.   Cardiovascular: Normal rate, regular rhythm and normal heart sounds.   No murmur heard.  No edema Pulmonary/Chest: Effort normal and breath sounds normal. No respiratory distress. No wheezes.       Assessment & Plan:   See Problem List for Assessment and Plan of chronic medical problems.  Follow up in 6 months for CPE - blood work one week prior

## 2015-09-30 ENCOUNTER — Encounter: Payer: Self-pay | Admitting: Internal Medicine

## 2015-10-17 ENCOUNTER — Ambulatory Visit (INDEPENDENT_AMBULATORY_CARE_PROVIDER_SITE_OTHER): Payer: Medicare Other | Admitting: Family Medicine

## 2015-10-17 VITALS — BP 134/72 | HR 81 | Temp 98.5°F | Ht 65.5 in | Wt 186.2 lb

## 2015-10-17 DIAGNOSIS — R05 Cough: Secondary | ICD-10-CM | POA: Diagnosis not present

## 2015-10-17 DIAGNOSIS — J208 Acute bronchitis due to other specified organisms: Secondary | ICD-10-CM

## 2015-10-17 DIAGNOSIS — R059 Cough, unspecified: Secondary | ICD-10-CM

## 2015-10-17 MED ORDER — DOXYCYCLINE HYCLATE 100 MG PO CAPS: 100.0000 mg | ORAL_CAPSULE | Freq: Two times a day (BID) | ORAL | Status: DC

## 2015-10-17 NOTE — Progress Notes (Signed)
[place] at [facility] [address] [place], [place] [address] [id number] [phone]  Date:  10/17/2015   Name:  [name]   DOB:  Sep 21, 1940   MRN:  [id number]  PCP:  Binnie Rail, MD    Chief Complaint: Cough   History of Present Illness:  [name] is a 75 y.o. very pleasant female patient who presents with the following:  History of DM, CVA, HTN, osteopenia, GAD.  Here today with complaint of illness About 5 days ago she noted sneezing, then congestion which has since traveled into her chest. She is still blowing her nose quite a bit as well.   She has not noted a fever.  She has noted a bit of aches and chills.   The cough can be productive.   She has used some OTC cough syrup  No sick contacts.   She did notice a ST at first but this has resolved She is hurting from coughing so much She feels as though she is getting worse, not better  Her CVA left her with slight weakness of the right hand but she otherwise recovered fully  No GI symptoms. She tried some OTC delsym which did help  Patient Active Problem List   Diagnosis Date Noted  . Generalized anxiety disorder 06/20/2015  . SUI (stress urinary incontinence, female) 02/05/2014  . 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 C V A/STROKE 05/31/2010  . DM (diabetes mellitus), secondary, uncontrolled, with renal complications ([place]) [id number]  . DYSURIA 01/24/2010  . THYROID NODULE, RIGHT 01/21/2009  . FATIGUE 07/12/2008  . Vitamin D deficiency 12/10/2007  . HYPERLIPIDEMIA 06/17/2007  . Essential hypertension 06/17/2007  . Osteopenia 06/17/2007  . Osteoarthrosis, unspecified whether generalized or localized, unspecified site 11/11/2006    Past Medical History  Diagnosis Date  . Hyperlipidemia   . Hypertension   . Depression     PMH of  . Osteoporosis   . Thyroid disease     right thyroid nodule-- BX DONE 08-15-2011  . DVT (deep venous thrombosis) ([place]) 2006     after arthroscopy  . Obesity     Redux therapy  . MRSA (methicillin resistant staph aureus) culture positive     X3; last post TKR  . History of CVA (cerebrovascular accident) NEUROLOGIST  DR WILLIS----  06-01-2010-  LEFT BASAL GANGLIA HEMORRAGE    RESIDUAL RIGHT HAND NUMBNESS/TINGLING  . Numbness and tingling in right hand RESIDUAL FROM CVA NOV 2011  . Diabetes mellitus ORAL MED  . Anxiety   . Asymptomatic carotid artery stenosis LEFT --  MOD. PER DR WILLIS NOTE OCT 2012  . Benign heart murmur   . Nocturia   . Frequency of urination   . Arthritis HANDS, NECK , BACK    Past Surgical History  Procedure Laterality Date  . Carpal tunnel release  2000    RIGHT  . G2 p2    . Knee arthroscopy      BILATERAL PRIOR TO TOTAL KNEE  . Total knee arthroplasty  05-26-2002       LEFT   AND RIGHT  03-30-2098  . Lumbar laminectomy  08-26-2009    L 4 - 5  . Cystoscope      to evaluate recurrent UTIs  . Colonoscopy  1999 & 2005    Dr Olevia Perches  . Abdominal hysterectomy  1973    Endometriosis & fibroid  . Left shoulder surg  1990'S  .  Joint replacement  03-28-2006    LEFT THUMB  . Thyroid nodule bx  08-15-2011    RIGHT  . Cystoscopy with biopsy  08/22/2011    Procedure: CYSTOSCOPY WITH BIOPSY;  Surgeon: Molli Hazard, MD;  Location: [facility];  Service: Urology;  Laterality: N/A;  . Cystoscopy w/ retrogrades  08/22/2011    Procedure: CYSTOSCOPY WITH RETROGRADE PYELOGRAM;  Surgeon: Molli Hazard, MD;  Location: [facility];  Service: Urology;  Laterality: Bilateral;  . Wrist surgery  2014    tendon placed in joint; Dr Burney Gauze    Social History  Substance Use Topics  . Smoking status: Never Smoker   . Smokeless tobacco: Never Used  . Alcohol Use: Yes     Comment:  very rarely , < 1 glass wine / month    Family History  Problem Relation Age of Onset  . Bladder Cancer Mother   . Diabetes Mother   . Brain cancer Sister   . Prostate  cancer Brother   . Alzheimer's disease Brother   . Diabetes Maternal Aunt   . Diabetes Maternal Uncle   . Diabetes Maternal Grandmother     Type 2  . Diabetes Maternal Grandfather     Type 2  . Stroke Paternal Grandfather     in 56s  . Diabetes Sister 25    TYPE 1   . Pancreatic cancer Sister   . Kidney failure Sister     in context of DM & influenza  . Heart disease Neg Hx     Allergies  Allergen Reactions  . Conjugated Estrogens     REACTION: weight gain  & fibrocystic breast disease changes  . Percocet [Oxycodone-Acetaminophen] Itching  . Detrol [Tolterodine]     Rxed by [facility] Excessive drying    Medication list has been reviewed and updated.  Current Outpatient Prescriptions on File Prior to Visit  Medication Sig Dispense Refill  . aspirin 81 MG tablet Take 81 mg by mouth daily.    . benazepril (LOTENSIN) 20 MG tablet Take 1 tablet (20 mg total) by mouth daily. 90 tablet 1  . Blood Glucose Monitoring Suppl (FREESTYLE LITE) DEVI Use meter daily to check blood sugar as instructed. 1 each 0  . calcium citrate-vitamin D 500-400 MG-UNIT chewable tablet Chew 1 tablet by mouth daily.    . Cholecalciferol (VITAMIN D3) 1000 UNITS CAPS Take 1 capsule by mouth daily.    . citalopram (CELEXA) 20 MG tablet Take 1 tablet (20 mg total) by mouth daily. 90 tablet 1  . clonazePAM (KLONOPIN) 0.5 MG tablet TAKE ONE TABLET BY MOUTH ONCE DAILY AS NEEDED FOR ANXIETY 30 tablet 0  . CRANBERRY PO Take 300 mg by mouth daily.    . cycloSPORINE (RESTASIS) 0.05 % ophthalmic emulsion Place 1 drop into both eyes 2 (two) times daily.    Marland Kitchen FREESTYLE LITE test strip USE TO CHECK BLOOD SUGAR DAILY AS DIRECTED 100 each 2  . glimepiride (AMARYL) 2 MG tablet Take 1 tablet (2 mg total) by mouth daily before breakfast. (Patient taking differently: Take 2 mg by mouth 2 (two) times daily. ) 90 tablet 1  . metFORMIN (GLUCOPHAGE) 1000 MG tablet Take 1 tab with breakfast, 1/2 tab with lunch and 1 tab with  dinner 225 tablet 1  . methylcellulose (ARTIFICIAL TEARS) 1 % ophthalmic solution Place 1 drop into both eyes as needed.    . Multiple Vitamins-Calcium (ONE-A-DAY WOMENS FORMULA) TABS Take 1 tablet by  mouth daily.    . pravastatin (PRAVACHOL) 40 MG tablet Take 1 tablet (40 mg total) by mouth at bedtime. 90 tablet 1   No current facility-administered medications on file prior to visit.    Review of Systems:  As per HPI- otherwise negative.   Physical Examination: Filed Vitals:   10/17/15 1557  BP: 134/72  Pulse: 81  Temp: 98.5 F (36.9 C)   Filed Vitals:   10/17/15 1557  Height: 5' 5.5" (1.664 m)  Weight: 186 lb 3.2 oz (84.46 kg)   Body mass index is 30.5 kg/(m^2). Ideal Body Weight: Weight in (lb) to have BMI = 25: 152.2  GEN: WDWN, NAD, Non-toxic, A & O x 3, looks well HEENT: Atraumatic, Normocephalic. Neck supple. No masses, No LAD.  Bilateral TM wnl, oropharynx normal.  PEERL,EOMI.   Ears and Nose: No external deformity. CV: RRR, No M/G/R. No JVD. No thrill. No extra heart sounds. PULM: CTA B, no wheezes, crackles, rhonchi. No retractions. No resp. distress. No accessory muscle use. EXTR: No c/c/e NEURO Normal gait.  PSYCH: Normally interactive. Conversant. Not depressed or anxious appearing.  Calm demeanor.    Assessment and Plan: Acute bronchitis due to other specified organisms - Plan: doxycycline (VIBRAMYCIN) 100 MG capsule  Cough  Treat for bronchitis with doxycycline. She is doing ok with delsym so will not rx anything narcotic.  Asked her to let me know if not feeling better in the next few days and she will do so  Signed Lamar Blinks, MD

## 2015-10-17 NOTE — Patient Instructions (Signed)
We are going to treat you for bronchitis with doxycycline-take this twice a day for 10 days Let me know if you are not feeling better in the next few days- Sooner if worse.   Ok to continue to use OTC medications as you like

## 2015-10-19 ENCOUNTER — Other Ambulatory Visit: Payer: Self-pay | Admitting: Internal Medicine

## 2015-10-20 NOTE — Telephone Encounter (Signed)
RX faxed to pharm  

## 2015-10-24 DIAGNOSIS — H35371 Puckering of macula, right eye: Secondary | ICD-10-CM | POA: Diagnosis not present

## 2015-10-24 DIAGNOSIS — H43811 Vitreous degeneration, right eye: Secondary | ICD-10-CM | POA: Diagnosis not present

## 2015-10-24 DIAGNOSIS — H35341 Macular cyst, hole, or pseudohole, right eye: Secondary | ICD-10-CM | POA: Diagnosis not present

## 2015-11-21 ENCOUNTER — Other Ambulatory Visit: Payer: Self-pay | Admitting: Internal Medicine

## 2015-11-21 NOTE — Telephone Encounter (Signed)
RX faxed to [facility] 

## 2015-12-21 ENCOUNTER — Other Ambulatory Visit: Payer: Self-pay | Admitting: Internal Medicine

## 2015-12-21 NOTE — Telephone Encounter (Signed)
[facility] faxed to [facility] 

## 2015-12-24 ENCOUNTER — Other Ambulatory Visit: Payer: Self-pay | Admitting: Internal Medicine

## 2016-01-03 ENCOUNTER — Other Ambulatory Visit (INDEPENDENT_AMBULATORY_CARE_PROVIDER_SITE_OTHER): Payer: Medicare Other

## 2016-01-03 ENCOUNTER — Telehealth: Payer: Self-pay | Admitting: Internal Medicine

## 2016-01-03 DIAGNOSIS — R300 Dysuria: Secondary | ICD-10-CM

## 2016-01-03 LAB — URINALYSIS, ROUTINE W REFLEX MICROSCOPIC
BILIRUBIN URINE: NEGATIVE
KETONES UR: NEGATIVE
NITRITE: POSITIVE — AB
Specific Gravity, Urine: 1.02 (ref 1.000–1.030)
TOTAL PROTEIN, URINE-UPE24: NEGATIVE
URINE GLUCOSE: 250 — AB
Urobilinogen, UA: 0.2 (ref 0.0–1.0)
pH: 5.5 (ref 5.0–8.0)

## 2016-01-03 NOTE — Telephone Encounter (Signed)
Patient believes she has a bladder infection.  Would like to know if Dr. Quay Burow could enter labs for her.

## 2016-01-03 NOTE — Telephone Encounter (Signed)
ordered

## 2016-01-03 NOTE — Telephone Encounter (Signed)
Please advise 

## 2016-01-03 NOTE — Telephone Encounter (Signed)
Spoke with pt to inform.  

## 2016-01-05 ENCOUNTER — Other Ambulatory Visit: Payer: Self-pay | Admitting: Internal Medicine

## 2016-01-05 LAB — URINE CULTURE: Colony Count: >=100000

## 2016-01-05 MED ORDER — FOSFOMYCIN TROMETHAMINE 3 G PO PACK: 3.0000 g | PACK | Freq: Once | ORAL | Status: DC

## 2016-01-05 MED ORDER — SULFAMETHOXAZOLE-TRIMETHOPRIM 800-160 MG PO TABS: 1.0000 | ORAL_TABLET | Freq: Two times a day (BID) | ORAL | Status: DC

## 2016-01-09 ENCOUNTER — Telehealth: Payer: Self-pay

## 2016-01-09 NOTE — Telephone Encounter (Signed)
Call to Ms. Bones and lvm to schedule AWV; as was completed last year 6/22 / Left direct number [phone]

## 2016-01-10 NOTE — Telephone Encounter (Signed)
Call back from Ms. Kolbeck. Scheduled AWV next Thusday at [place]; (6/29)

## 2016-01-18 ENCOUNTER — Emergency Department (HOSPITAL_COMMUNITY): Payer: Medicare Other

## 2016-01-18 ENCOUNTER — Emergency Department (HOSPITAL_COMMUNITY)
Admission: EM | Admit: 2016-01-18 | Discharge: 2016-01-19 | Disposition: A | Discharge status: Other Acute Inpatient Hospital | Payer: Medicare Other | Attending: Emergency Medicine | Admitting: Emergency Medicine

## 2016-01-18 ENCOUNTER — Encounter (HOSPITAL_COMMUNITY): Payer: Self-pay | Admitting: Emergency Medicine

## 2016-01-18 DIAGNOSIS — I10 Essential (primary) hypertension: Secondary | ICD-10-CM | POA: Diagnosis not present

## 2016-01-18 DIAGNOSIS — E119 Type 2 diabetes mellitus without complications: Secondary | ICD-10-CM | POA: Diagnosis not present

## 2016-01-18 DIAGNOSIS — Y92007 Garden or yard of unspecified non-institutional (private) residence as the place of occurrence of the external cause: Secondary | ICD-10-CM | POA: Diagnosis not present

## 2016-01-18 DIAGNOSIS — W01198A Fall on same level from slipping, tripping and stumbling with subsequent striking against other object, initial encounter: Secondary | ICD-10-CM | POA: Insufficient documentation

## 2016-01-18 DIAGNOSIS — S199XXA Unspecified injury of neck, initial encounter: Secondary | ICD-10-CM | POA: Diagnosis not present

## 2016-01-18 DIAGNOSIS — S0990XA Unspecified injury of head, initial encounter: Secondary | ICD-10-CM | POA: Diagnosis not present

## 2016-01-18 DIAGNOSIS — E785 Hyperlipidemia, unspecified: Secondary | ICD-10-CM | POA: Insufficient documentation

## 2016-01-18 DIAGNOSIS — Y93H2 Activity, gardening and landscaping: Secondary | ICD-10-CM | POA: Diagnosis not present

## 2016-01-18 DIAGNOSIS — R531 Weakness: Secondary | ICD-10-CM | POA: Diagnosis not present

## 2016-01-18 DIAGNOSIS — M5410 Radiculopathy, site unspecified: Secondary | ICD-10-CM

## 2016-01-18 DIAGNOSIS — W19XXXA Unspecified fall, initial encounter: Secondary | ICD-10-CM

## 2016-01-18 DIAGNOSIS — R200 Anesthesia of skin: Secondary | ICD-10-CM | POA: Insufficient documentation

## 2016-01-18 DIAGNOSIS — S069X9A Unspecified intracranial injury with loss of consciousness of unspecified duration, initial encounter: Secondary | ICD-10-CM | POA: Insufficient documentation

## 2016-01-18 DIAGNOSIS — R51 Headache: Secondary | ICD-10-CM | POA: Diagnosis not present

## 2016-01-18 DIAGNOSIS — Z86718 Personal history of other venous thrombosis and embolism: Secondary | ICD-10-CM | POA: Diagnosis not present

## 2016-01-18 DIAGNOSIS — I69398 Other sequelae of cerebral infarction: Secondary | ICD-10-CM | POA: Insufficient documentation

## 2016-01-18 DIAGNOSIS — Y998 Other external cause status: Secondary | ICD-10-CM | POA: Diagnosis not present

## 2016-01-18 DIAGNOSIS — M542 Cervicalgia: Secondary | ICD-10-CM

## 2016-01-18 DIAGNOSIS — Z7984 Long term (current) use of oral hypoglycemic drugs: Secondary | ICD-10-CM | POA: Insufficient documentation

## 2016-01-18 MED ORDER — MORPHINE SULFATE (PF) 4 MG/ML IV SOLN
4.0000 mg | Freq: Once | INTRAVENOUS | Status: AC
  Administered 2016-01-18: 4 mg via INTRAVENOUS
  Filled 2016-01-18: qty 1

## 2016-01-18 MED ORDER — ONDANSETRON HCL 4 MG/2ML IJ SOLN
4.0000 mg | Freq: Once | INTRAMUSCULAR | Status: AC
  Administered 2016-01-18: 4 mg via INTRAVENOUS
  Filled 2016-01-18: qty 2

## 2016-01-18 MED ORDER — OXYCODONE-ACETAMINOPHEN 5-325 MG PO TABS
1.0000 | ORAL_TABLET | ORAL | Status: DC | PRN
  Administered 2016-01-18: 1 via ORAL
  Filled 2016-01-18: qty 1

## 2016-01-18 MED ORDER — DIPHENHYDRAMINE HCL 25 MG PO CAPS
25.0000 mg | ORAL_CAPSULE | Freq: Once | ORAL | Status: AC
  Administered 2016-01-18: 25 mg via ORAL
  Filled 2016-01-18: qty 1

## 2016-01-18 NOTE — ED Notes (Addendum)
Patient states she was working in the yard and she fell hit a brick due a "hose slipping and throwing her backwards" Patient complaining of pain in her neck and down both of her arms. Patient is alert and oriented x4. No neuro deficits noted during triage. Abrasion noted to patient's right forehead and side of face. Denies taking blood thinners, besides aspirin.

## 2016-01-18 NOTE — ED Provider Notes (Signed)
CSN: [id number]     Arrival date & time 01/18/16  1813 History   First MD Initiated Contact with Patient 01/18/16 2101     Chief Complaint  Patient presents with  . Fall  . Neck Pain  . Head Injury     (Consider location/radiation/quality/duration/timing/severity/associated sxs/prior Treatment) HPI 75 year old female who presents after fall with neck pain. She has a history of hypertension, hyperlipidemia, prior DVT, no longer on anticoagulation. States that she has been in her usual state of health, and was trying to loosen a rock in her garden with a shuffle. Shovel slipped, and she was thrown back. She hit the left side of her head against a brick. Had loss of consciousness, and upon awakening developed neck pain with shooting burning pain down bilateral upper extremities. Does feel weak in her arms, but feels that this also may be limited by pain. Was able to stand up and ambulate initially. Denies vision or speech changes, nausea or vomiting, chest pain or difficulty breathing, abdominal pain, or back pain. Past Medical History  Diagnosis Date  . Hyperlipidemia   . Hypertension   . Depression     PMH of  . Osteoporosis   . Thyroid disease     right thyroid nodule-- BX DONE 08-15-2011  . DVT (deep venous thrombosis) ([place]) 2006    after arthroscopy  . Obesity     Redux therapy  . MRSA (methicillin resistant staph aureus) culture positive     X3; last post TKR  . History of CVA (cerebrovascular accident) NEUROLOGIST  DR WILLIS----  06-01-2010-  LEFT BASAL GANGLIA HEMORRAGE    RESIDUAL RIGHT HAND NUMBNESS/TINGLING  . Numbness and tingling in right hand RESIDUAL FROM CVA NOV 2011  . Diabetes mellitus ORAL MED  . Anxiety   . Asymptomatic carotid artery stenosis LEFT --  MOD. PER DR WILLIS NOTE OCT 2012  . Benign heart murmur   . Nocturia   . Frequency of urination   . Arthritis HANDS, NECK , BACK   Past Surgical History  Procedure Laterality Date  . Carpal tunnel release   2000    RIGHT  . G2 p2    . Knee arthroscopy      BILATERAL PRIOR TO TOTAL KNEE  . Total knee arthroplasty  05-26-2002       LEFT   AND RIGHT  03-30-2098  . Lumbar laminectomy  08-26-2009    L 4 - 5  . Cystoscope      to evaluate recurrent UTIs  . Colonoscopy  1999 & 2005    Dr Olevia Perches  . Abdominal hysterectomy  1973    Endometriosis & fibroid  . Left shoulder surg  1990'S  . Joint replacement  03-28-2006    LEFT THUMB  . Thyroid nodule bx  08-15-2011    RIGHT  . Cystoscopy with biopsy  08/22/2011    Procedure: CYSTOSCOPY WITH BIOPSY;  Surgeon: Molli Hazard, MD;  Location: [facility];  Service: Urology;  Laterality: N/A;  . Cystoscopy w/ retrogrades  08/22/2011    Procedure: CYSTOSCOPY WITH RETROGRADE PYELOGRAM;  Surgeon: Molli Hazard, MD;  Location: [facility];  Service: Urology;  Laterality: Bilateral;  . Wrist surgery  2014    tendon placed in joint; Dr Burney Gauze   Family History  Problem Relation Age of Onset  . Bladder Cancer Mother   . Diabetes Mother   . Brain cancer Sister   . Prostate cancer Brother   . Alzheimer's  disease Brother   . Diabetes Maternal Aunt   . Diabetes Maternal Uncle   . Diabetes Maternal Grandmother     Type 2  . Diabetes Maternal Grandfather     Type 2  . Stroke Paternal Grandfather     in 75s  . Diabetes Sister 97    TYPE 1   . Pancreatic cancer Sister   . Kidney failure Sister     in context of DM & influenza  . Heart disease Neg Hx    Social History  Substance Use Topics  . Smoking status: Never Smoker   . Smokeless tobacco: Never Used  . Alcohol Use: Yes     Comment:  very rarely , < 1 glass wine / month   OB History    No data available     Review of Systems 10/14 systems reviewed and are negative other than those stated in the HPI    Allergies  Conjugated estrogens; Percocet; and Detrol  Home Medications   Prior to Admission medications   Medication Sig Start Date  End Date Taking? Authorizing Provider  aspirin 81 MG tablet Take 81 mg by mouth at bedtime.    Yes Historical Provider, MD  benazepril (LOTENSIN) 20 MG tablet TAKE 1 TABLET DAILY 12/24/15  Yes Binnie Rail, MD  calcium citrate-vitamin D 500-400 MG-UNIT chewable tablet Chew 1 tablet by mouth at bedtime.    Yes Historical Provider, MD  Cholecalciferol (VITAMIN D3) 1000 UNITS CAPS Take 1 capsule by mouth daily.   Yes Historical Provider, MD  citalopram (CELEXA) 20 MG tablet TAKE 1 TABLET DAILY 12/24/15  Yes Binnie Rail, MD  clonazePAM (KLONOPIN) 0.5 MG tablet TAKE ONE TABLET BY MOUTH ONCE DAILY AS NEEDED FOR ANXIETY 12/21/15  Yes Binnie Rail, MD  CRANBERRY PO Take 300 mg by mouth daily.   Yes Historical Provider, MD  cycloSPORINE (RESTASIS) 0.05 % ophthalmic emulsion Place 1 drop into both eyes 2 (two) times daily.   Yes Historical Provider, MD  glimepiride (AMARYL) 1 MG tablet Take 1 mg by mouth 2 (two) times daily.   Yes Historical Provider, MD  metFORMIN (GLUCOPHAGE) 1000 MG tablet TAKE 1 TABLET WITH BREAKFAST, ONE-HALF (1/2) TABLET WITH LUNCH AND 1 TABLET WITH DINNER 12/24/15  Yes Binnie Rail, MD  methylcellulose (ARTIFICIAL TEARS) 1 % ophthalmic solution Place 1 drop into both eyes daily as needed (itching eyes).    Yes Historical Provider, MD  Multiple Vitamins-Calcium (ONE-A-DAY WOMENS FORMULA) TABS Take 1 tablet by mouth daily.   Yes Historical Provider, MD  pravastatin (PRAVACHOL) 40 MG tablet TAKE 1 TABLET AT BEDTIME 12/24/15  Yes Binnie Rail, MD  Blood Glucose Monitoring Suppl (FREESTYLE LITE) DEVI Use meter daily to check blood sugar as instructed. 06/20/15   Golden Circle, FNP  doxycycline (VIBRAMYCIN) 100 MG capsule Take 1 capsule (100 mg total) by mouth 2 (two) times daily. Patient not taking: Reported on 01/18/2016 10/17/15   Darreld Mclean, MD  FREESTYLE LITE test strip USE TO CHECK BLOOD SUGAR DAILY AS DIRECTED 02/14/15   Hendricks Limes, MD  glimepiride (AMARYL) 2 MG tablet Take  1 tablet (2 mg total) by mouth daily before breakfast. Patient not taking: Reported on 01/18/2016 09/27/15   Binnie Rail, MD  sulfamethoxazole-trimethoprim (BACTRIM DS,SEPTRA DS) 800-160 MG tablet Take 1 tablet by mouth 2 (two) times daily. Patient not taking: Reported on 01/18/2016 01/05/16   Binnie Rail, MD   BP 144/70 mmHg  Pulse  66  Temp(Src) 97.9 F (36.6 C) (Oral)  Resp 17  Ht 5\' 6"  (1.676 m)  Wt 180 lb (81.647 kg)  BMI 29.07 kg/m2  SpO2 96% Physical Exam  Physical Exam  Nursing note and vitals reviewed. Constitutional: Well developed, well nourished, non-toxic, and in no acute distress Head: Normocephalic. Superficial abrasion over left side of face and forehead. No deformities or crepitus.  Mouth/Throat: Oropharynx is clear and moist.  Neck: minimal midline cervical spine tenderness in mid to lower cervical spine with right paraspinal tenderness Cardiovascular: Normal rate and regular rhythm.   Pulmonary/Chest: Effort normal and breath sounds normal.  Abdominal: Soft. There is no tenderness. There is no rebound and no guarding.  Musculoskeletal: Normal range of motion.  Neurological: Alert, no facial droop, fluent speech, sensation to light touch intact in bilateral upper and lower extremities, pupils equal and reactive to light, extraocular movements intact, full strength in bilateral lower extremities, no pronator drift, slightly weak in bilateral upper extremities to hand grip  Skin: Skin is warm and dry.  Psychiatric: Cooperative   ED Course  Procedures (including critical care time) Labs Review Labs Reviewed - No data to display  Imaging Review Ct Head Wo Contrast  01/18/2016  CLINICAL DATA:  Pain after trauma. EXAM: CT HEAD WITHOUT CONTRAST TECHNIQUE: Contiguous axial images were obtained from the base of the skull through the vertex without intravenous contrast. COMPARISON:  August 07, 2010 FINDINGS: Paranasal sinuses, mastoid air cells, bones, and extracranial  soft tissues are within normal limits. No subdural, epidural, or subarachnoid hemorrhage. Ventricles and sulci are normal for age. Cerebellum, brainstem, and basal cisterns are within normal limits. No acute cortical ischemia or infarct. No mass, mass effect, or midline shift. IMPRESSION: No acute abnormalities. Electronically Signed   By: Dorise Bullion III M.D   On: 01/18/2016 20:00   Ct Cervical Spine Wo Contrast  01/18/2016  CLINICAL DATA:  Neck pain after a fall today. Trauma to the left side of head with loss of consciousness. Shooting and burning pain into both upper extremities. EXAM: CT CERVICAL SPINE WITHOUT CONTRAST TECHNIQUE: Multidetector CT imaging of the cervical spine was performed without intravenous contrast. Multiplanar CT image reconstructions were also generated. COMPARISON:  CT head without contrast from the same day. FINDINGS: Cervical spine is imaged from the skullbase through T2-3. Prevertebral soft tissues are within normal limits. Chronic endplate degenerative changes are noted with loss of disc height at C4-5, C5-6, and C6-7. Vertebral body heights are maintained. No acute fracture traumatic subluxation is evident. There is mild degenerative anterolisthesis at C3-4. The posterior elements are fused on the left at C2-3. The visualized upper ribs are intact. Lung apices are clear. The craniocervical junction is within normal limits. IMPRESSION: 1. Multilevel degenerative changes in the cervical spine. 2. No acute fractures. Electronically Signed   By: San Morelle M.D.   On: 01/18/2016 22:03   I have personally reviewed and evaluated these images and lab results as part of my medical decision-making.   EKG Interpretation None      MDM   Final diagnoses:  Neck pain  Fall, initial encounter    75 year old female who presents after mechanical fall. Concern for cervical spine injury as she has neck tenderness, neuropathic pain in bilateral hands and ? Of weakness in  hands (although she states she is unsure if she is just limited by pain). Cervical collar was applied on presentation. CT head and cervical spine are negative for acute traumatic injuries. Will require  MRI cervical spine to rule out central cord syndrome or spinal cord injury. Discussed with Dr. Tyrone Nine from [facility] who accepted patient for transfer to [facility] for MRI.     Forde Dandy, MD 01/19/16 [phone]

## 2016-01-18 NOTE — ED Notes (Signed)
Eric RN aware of pts arrival to the ED for MRI, accepted by Dr Tyrone Nine

## 2016-01-19 ENCOUNTER — Emergency Department (HOSPITAL_COMMUNITY): Payer: Medicare Other

## 2016-01-19 ENCOUNTER — Telehealth: Payer: Self-pay

## 2016-01-19 ENCOUNTER — Ambulatory Visit: Payer: Medicare Other

## 2016-01-19 DIAGNOSIS — S199XXA Unspecified injury of neck, initial encounter: Secondary | ICD-10-CM | POA: Diagnosis not present

## 2016-01-19 MED ORDER — MORPHINE SULFATE (PF) 4 MG/ML IV SOLN
4.0000 mg | Freq: Once | INTRAVENOUS | Status: AC
  Administered 2016-01-19: 4 mg via INTRAVENOUS
  Filled 2016-01-19: qty 1

## 2016-01-19 MED ORDER — TRAMADOL HCL 50 MG PO TABS: 50.0000 mg | ORAL_TABLET | Freq: Two times a day (BID) | ORAL | Status: DC | PRN

## 2016-01-19 NOTE — Discharge Instructions (Signed)
Cervical Sprain [name], your MRI results are below.  See. Dr. Joya Salm within 3 days for close follow up.  Wear your soft collar until cleared by him.  Take ibuprofen for pain and take tramadol for severe pain.  If symptoms worsen, come back to the ED immediately. Thank you.  IMPRESSION: 1. Trace prevertebral edema. Finding is nonspecific, but may reflect low grade ligamentous strain if the patient has acute neck pain/tenderness. 2. No other MRI evidence for acute traumatic injury within the cervical spine. No evidence for spinal cord injury. 3. Reversal of the normal cervical lordosis with moderate degenerative spondylolysis at C4-5 through C6-7. There is resultant moderate diffuse canal narrowing at these levels (most severe at C5-6) with fairly severe bilateral foraminal narrowing.  A cervical sprain is when the tissues (ligaments) that hold the neck bones in place stretch or tear. HOME CARE   Put ice on the injured area.  Put ice in a plastic bag.  Place a towel between your skin and the bag.  Leave the ice on for 15-20 minutes, 3-4 times a day.  You may have been given a collar to wear. This collar keeps your neck from moving while you heal.  Do not take the collar off unless told by your doctor.  If you have long hair, keep it outside of the collar.  Ask your doctor before changing the position of your collar. You may need to change its position over time to make it more comfortable.  If you are allowed to take off the collar for cleaning or bathing, follow your doctor's instructions on how to do it safely.  Keep your collar clean by wiping it with mild soap and water. Dry it completely. If the collar has removable pads, remove them every 1-2 days to hand wash them with soap and water. Allow them to air dry. They should be dry before you wear them in the collar.  Do not drive while wearing the collar.  Only take medicine as told by your doctor.  Keep all doctor visits as  told.  Keep all physical therapy visits as told.  Adjust your work station so that you have good posture while you work.  Avoid positions and activities that make your problems worse.  Warm up and stretch before being active. GET HELP IF:  Your pain is not controlled with medicine.  You cannot take less pain medicine over time as planned.  Your activity level does not improve as expected. GET HELP RIGHT AWAY IF:   You are bleeding.  Your stomach is upset.  You have an allergic reaction to your medicine.  You develop new problems that you cannot explain.  You lose feeling (become numb) or you cannot move any part of your body (paralysis).  You have tingling or weakness in any part of your body.  Your symptoms get worse. Symptoms include:  Pain, soreness, stiffness, puffiness (swelling), or a burning feeling in your neck.  Pain when your neck is touched.  Shoulder or upper back pain.  Limited ability to move your neck.  Headache.  Dizziness.  Your hands or arms feel week, lose feeling, or tingle.  Muscle spasms.  Difficulty swallowing or chewing. MAKE SURE YOU:   Understand these instructions.  Will watch your condition.  Will get help right away if you are not doing well or get worse.   This information is not intended to replace advice given to you by your health care provider. Make sure you  discuss any questions you have with your health care provider.   Document Released: 12/26/2007 Document Revised: 03/11/2013 Document Reviewed: 01/14/2013 Elsevier Interactive Patient Education [employer].

## 2016-01-19 NOTE — Telephone Encounter (Signed)
Ms. [name] scheduled for AWV today; Call to home as she was in ER 6/28 from fall and loss of consciousness;  MRI completed  Left VM to call back; AWV was scheduled today at 10am and was canceled by the patient Tried to outreach to fup but no answer.   Dr. Quay Burow, just [facility].

## 2016-01-19 NOTE — ED Notes (Signed)
[facility] called to transport pt to [facility] ED, [facility] cancelled due to volume

## 2016-01-19 NOTE — ED Notes (Signed)
Patient transported to MRI 

## 2016-01-25 ENCOUNTER — Other Ambulatory Visit: Payer: Self-pay | Admitting: Internal Medicine

## 2016-01-30 ENCOUNTER — Telehealth: Payer: Self-pay

## 2016-01-30 NOTE — Telephone Encounter (Signed)
fup for AWV; Scheduled for August 1st at 11 am

## 2016-02-09 DIAGNOSIS — M5021 Other cervical disc displacement,  high cervical region: Secondary | ICD-10-CM | POA: Diagnosis not present

## 2016-02-20 NOTE — Progress Notes (Addendum)
Subjective:   [name] is a 75 y.o. female who presents for Medicare Annual (Subsequent) preventive examination.  Review of Systems:  HRA assessment completed during this visit with   The Patient was informed that the wellness visit is to identify future health risk and educate and initiate measures that can reduce risk for increased disease through the lifespan.    NO ROS; Medicare Wellness Visit Last OV:  10/17/2015 by Dr. Lorelei Pont but may have been stopped in ER  Had ER visit in June 28th; hit her head on brick; strained her neck;  See MRI for more details  Lost consciousness / had cell phone with her; called a friend to take her ER Seeing Dr. Nicholes Calamity; Prednisone po,  tomorrow is her last day 08/01/ regulating meds but Amaryl 2 mg bid was ordered on 3/27 but was not on med sheet today; Was in MD note;  Dr. Nicholes Calamity; will fup with her in 3 weeks   HTN; DM; OA; Osteopenia; Vit d; Hyperlipidemia; all under current medical treatment  A1c  7.7; 05/2015 Knee replacment  02/2015 recent sleep studies   Psychosocial:Social history: lives alone; son is in back apt [place]; [employer]; has a plan if she can not longer stay in her home  Medications reviewed for issues; compliant; monitors bs Is currently taking Glimepiride 2mg  tablet; on in the am and one in the pm post prednisone   BMI: 30.7 2016/ This year down to 28  Diet; watch sweets and carbs; meat and vegetables; Breakfast; try to eat oatmeal and cereal; egg sometimes;  Lunch; hungry at 2 to 3pm ; sandwich;  Dinner; trying to watch what she is eating   Exercise; water aerobics; 45 tiw; Has a garden; takes the dog for a walk  Water aerobics on HOLD until Dr. Nicholes Calamity ok's  HOME SAFETY reviewed for short term and long term; has visited [employer] in 5 years or so  Lives in one level;  This would be independent living;  Personal safety issues reviewed for risk such as safe community; smoke detector;  firearms safety if applicable; protection when in the sun; driving safety for seniors or any recent accidents. Fall hx; x 1 recent; Accident in gardent  UA or BOWEL incontinence; long term treatment for bladder  Doing well; bladder infections; had sling put in Functional losses from last year to this year? no Given education on "Fall Prevention in the Home" for more safety tips the patient can apply as appropriate.   Risk for Depression reviewed: Any emotional problems? Anxious, depressed, irritable, sad or blue? No  2 sisters with Alz currently  One has spouse and one has dtr;  Denies feeling depressed or hopeless; voices pleasure in daily life How many social activities have you been engaged in within the last 2 weeks? none Sadness over ill health of sisters and friend 8 siblings and all gone except for 2 sisters;   Sleep: fairly well   Cognitive;  Still taking care of everyone  Manages checkbook, medications; no failures of task Ad8 score reviewed for issues;  Issues making decisions; no  Less interest in hobbies / activities" no  Repeats questions, stories; family complaining: NO  Trouble using ordinary gadgets; microwave; computer: no  Forgets the month or year: no  Mismanaging finances: no  Missing apt: no but does write them down  Daily problems with thinking of memory NO Ad8 score is 0   Advanced Directive addressed; Completed or educated  Counseling Health Maintenance Gaps: A1c: ( to be repeated x 1 month)  Foot exam; MD apt next month)  Colonoscopy; 01/2013/ had fistula p colon out;  (mother bladder; one sister tumor and one had pancreatic)  Bowel twisted; can do virtual colonoscopy ; as well  EKG:  08/2011 Mammogram: 08/2015 Dexa/ 09/05/2012 -2.4   Mild thi/nning present. Take calcium 600 mg two times a day & walk 30-45 min 3-4X /week. Recheck vitamin D level in 07/2010 (Code : 268.9); it was slightly low in 01/2010 @ 33 (goal =40-60). Recheck BMD every  25 months. Hopp PAP: educated regarding the need for GYN exam;    Hearing: adequate   Ophthalmology exam - have regularly - Dr. Cindie Laroche;  [place] [facility]  Seen retinal specialist; seen last with complete eye exam  Immunizations Due: (Vaccines reviewed and educated regarding any overdue)    Established and updated Risk reviewed and appropriate referral made or health recommendations:  Current Care Team reviewed and updated         Objective:     Vitals: BP 110/60   Pulse 85   Ht 5\' 6"  (1.676 m)   Wt 176 lb (79.8 kg)   SpO2 95%   BMI 28.41 kg/m   Body mass index is 28.41 kg/m.   Tobacco History  Smoking Status  . Never Smoker  Smokeless Tobacco  . Never Used     Counseling given: Yes   Past Medical History:  Diagnosis Date  . Anxiety   . Arthritis HANDS, NECK , BACK  . Asymptomatic carotid artery stenosis LEFT --  MOD. PER DR WILLIS NOTE OCT 2012  . Benign heart murmur   . Depression    PMH of  . Diabetes mellitus ORAL MED  . DVT (deep venous thrombosis) ([place]) 2006   after arthroscopy  . Frequency of urination   . History of CVA (cerebrovascular accident) NEUROLOGIST  DR WILLIS----  06-01-2010-  LEFT BASAL GANGLIA HEMORRAGE   RESIDUAL RIGHT HAND NUMBNESS/TINGLING  . Hyperlipidemia   . Hypertension   . MRSA (methicillin resistant staph aureus) culture positive    X3; last post TKR  . Nocturia   . Numbness and tingling in right hand RESIDUAL FROM CVA NOV 2011  . Obesity    Redux therapy  . Osteoporosis   . Thyroid disease    right thyroid nodule-- BX DONE 08-15-2011   Past Surgical History:  Procedure Laterality Date  . ABDOMINAL HYSTERECTOMY  1973   Endometriosis & fibroid  . CARPAL TUNNEL RELEASE  2000   RIGHT  . COLONOSCOPY  1999 & 2005   Dr Olevia Perches  . cystoscope     to evaluate recurrent UTIs  . CYSTOSCOPY W/ RETROGRADES  08/22/2011   Procedure: CYSTOSCOPY WITH RETROGRADE PYELOGRAM;  Surgeon: Molli Hazard, MD;  Location: [facility];  Service: Urology;  Laterality: Bilateral;  . CYSTOSCOPY WITH BIOPSY  08/22/2011   Procedure: CYSTOSCOPY WITH BIOPSY;  Surgeon: Molli Hazard, MD;  [facility];  Service: Urology;  Laterality: N/A;  . g2 p2    . JOINT REPLACEMENT  03-28-2006   LEFT THUMB  . KNEE ARTHROSCOPY     BILATERAL PRIOR TO TOTAL KNEE  . LEFT SHOULDER SURG  1990'S  . LUMBAR LAMINECTOMY  08-26-2009   L 4 - 5  . THYROID NODULE BX  08-15-2011   RIGHT  . TOTAL KNEE ARTHROPLASTY  05-26-2002      LEFT   AND RIGHT  03-30-2098  . WRIST SURGERY  2014   tendon placed in joint; Dr Burney Gauze   Family History  Problem Relation Age of Onset  . Bladder Cancer Mother   . Diabetes Mother   . Brain cancer Sister   . Prostate cancer Brother   . Alzheimer's disease Brother   . Diabetes Maternal Aunt   . Diabetes Maternal Uncle   . Diabetes Maternal Grandmother     Type 2  . Diabetes Maternal Grandfather     Type 2  . Stroke Paternal Grandfather     in 53s  . Diabetes Sister 2    TYPE 1   . Pancreatic cancer Sister   . Kidney failure Sister     in context of DM & influenza  . Heart disease Neg Hx    History  Sexual Activity  . Sexual activity: Not on file    Outpatient Encounter Prescriptions as of 02/21/2016  Medication Sig  . aspirin 81 MG tablet Take 81 mg by mouth at bedtime.   . benazepril (LOTENSIN) 20 MG tablet TAKE 1 TABLET DAILY  . Blood Glucose Monitoring Suppl (FREESTYLE LITE) DEVI Use meter daily to check blood sugar as instructed.  . calcium citrate-vitamin D 500-400 MG-UNIT chewable tablet Chew 1 tablet by mouth at bedtime.   . Cholecalciferol (VITAMIN D3) 1000 UNITS CAPS Take 1 capsule by mouth daily.  . citalopram (CELEXA) 20 MG tablet TAKE 1 TABLET DAILY  . clonazePAM (KLONOPIN) 0.5 MG tablet TAKE ONE TABLET BY MOUTH ONCE DAILY AS NEEDED FOR ANXIETY  . CRANBERRY PO Take 300 mg by mouth daily.  . cycloSPORINE (RESTASIS) 0.05 % ophthalmic  emulsion Place 1 drop into both eyes 2 (two) times daily.  Marland Kitchen FREESTYLE LITE test strip USE TO CHECK BLOOD SUGAR DAILY AS DIRECTED  . glimepiride (AMARYL) 2 MG tablet Take 2 mg by mouth 2 (two) times daily.  . metFORMIN (GLUCOPHAGE) 1000 MG tablet TAKE 1 TABLET WITH BREAKFAST, ONE-HALF (1/2) TABLET WITH LUNCH AND 1 TABLET WITH DINNER  . methylcellulose (ARTIFICIAL TEARS) 1 % ophthalmic solution Place 1 drop into both eyes daily as needed (itching eyes).   . Multiple Vitamins-Calcium (ONE-A-DAY WOMENS FORMULA) TABS Take 1 tablet by mouth daily.  . pravastatin (PRAVACHOL) 40 MG tablet TAKE 1 TABLET AT BEDTIME  . Lancets (FREESTYLE) lancets Use as instructed  . traMADol (ULTRAM) 50 MG tablet Take 1 tablet (50 mg total) by mouth every 12 (twelve) hours as needed for severe pain. (Patient not taking: Reported on 02/21/2016)   No facility-administered encounter medications on file as of 02/21/2016.     Activities of Daily Living No flowsheet data found.  Patient Care Team: Binnie Rail, MD as PCP - General (Internal Medicine)    Assessment:     Osteopenia; calcium and vit as well as weight bearing   Exercise Activities and Dietary recommendations    Goals    . Exercise 150 minutes per week (moderate activity)          Exercise when cervical issues are better and according to Dr. Nicholes Calamity;      . make appointments with self          Will continue to take personal time in lieu of many caregiving activities      Fall Risk Fall Risk  01/12/2015 08/22/2012  Falls in the past year? Yes No  Number falls in past yr: 2 or more -  Injury with Fall? No -  Risk for fall due  to : Other (Comment) -  Risk for fall due to (comments): works on balance in pool -  Follow up Education provided -   Depression Screen PHQ 2/9 Scores 04/12/2015 01/12/2015 08/22/2012  PHQ - 2 Score 0 0 0     Cognitive Testing MMSE - Mini Mental State Exam 01/12/2015  Not completed: Unable to complete   Ad8 score 0    Immunization History  Administered Date(s) Administered  . Influenza Whole 07/24/1999, 06/13/2005, 06/17/2007, 04/14/2008, 04/11/2010  . Influenza, Seasonal, Injecte, Preservative Fre 06/15/2014  . Influenza-Unspecified 04/22/2013, 05/02/2015  . Pneumococcal Conjugate-13 01/12/2015  . Pneumococcal Polysaccharide-23 08/03/2004  . Td 08/05/1996, 09/29/2010  . Zoster 12/21/2010   Screening Tests Health Maintenance  Topic Date Due  . FOOT EXAM  08/13/1950  . HEMOGLOBIN A1C  12/18/2015  . INFLUENZA VACCINE  02/21/2016  . OPHTHALMOLOGY EXAM  11/19/2016 (Originally 11/03/2015)  . TETANUS/TDAP  09/28/2020  . DEXA SCAN  Completed  . ZOSTAVAX  Completed  . PNA vac Low Risk Adult  Completed      Plan:   Glimepiride off med list but added as the patient stated she is taking and on Acute visit note 3/27  Taking Glimepiride 2mg  tab in the am and at supper BS still running 150 to 170 in the am on prednisone but normalizes during the day Prednisone for cervical injury comes off tomorrow    Will monitor as BS returns to normal post prednisone ending tomorrow 08/02   During the course of the visit the patient was educated and counseled about the following appropriate screening and preventive services:   Vaccines to include Pneumoccal, Influenza, Hepatitis B, Td, Zostavax, HCV  Electrocardiogram  Cardiovascular Disease  Colorectal cancer screening  Bone density screening  Diabetes screening  Glaucoma screening  Mammography/PAP  Nutrition counseling   Patient Instructions (the written plan) was given to the patient.   [id number], RN  02/21/2016    Medical screening examination/treatment/procedure(s) were performed by non-physician practitioner and as supervising physician I was immediately available for consultation/collaboration. I agree with above. Binnie Rail, MD

## 2016-02-21 ENCOUNTER — Ambulatory Visit (INDEPENDENT_AMBULATORY_CARE_PROVIDER_SITE_OTHER): Payer: Medicare Other

## 2016-02-21 VITALS — BP 110/60 | HR 85 | Ht 66.0 in | Wt 176.0 lb

## 2016-02-21 DIAGNOSIS — Z0000 Encounter for general adult medical examination without abnormal findings: Secondary | ICD-10-CM | POA: Diagnosis not present

## 2016-02-21 MED ORDER — FREESTYLE LANCETS MISC: 12 refills | Status: DC

## 2016-02-21 NOTE — Patient Instructions (Addendum)
[name] , Thank you for taking time to come for your Medicare Wellness Visit. I appreciate your ongoing commitment to your health goals. Please review the following plan we discussed and let me know if I can assist you in the future.   A1c and foot exam at next fup apt  Eye exam completed; deferred next eye exam until next year  Is monitoring BS; taking 2 mg glimepiride ac breakfast and supper Metformin as ordered; off prednisone tomorrow Checking BS post prednisone but states BS was running high Is cutting back on sugar and portions; BMI down to 28 from 30 last year   Needs refill for lancets and reordered via [facility]      These are the goals we discussed: Goals    . Exercise 150 minutes per week (moderate activity)          Exercise when cervical issues are better and according to Dr. Nicholes Calamity;      . make appointments with self          Will continue to take personal time in lieu of many caregiving activities       This is a list of the screening recommended for you and due dates:  Health Maintenance  Topic Date Due  . Complete foot exam   08/13/1950  . Eye exam for diabetics  11/03/2015  . Hemoglobin A1C  12/18/2015  . Flu Shot  02/21/2016  . Tetanus Vaccine  09/28/2020  . DEXA scan (bone density measurement)  Completed  . Shingles Vaccine  Completed  . Pneumonia vaccines  Completed      Diabetes and Foot Care Diabetes may cause you to have problems because of poor blood supply (circulation) to your feet and legs. This may cause the skin on your feet to become thinner, break easier, and heal more slowly. Your skin may become dry, and the skin may peel and crack. You may also have nerve damage in your legs and feet causing decreased feeling in them. You may not notice minor injuries to your feet that could lead to infections or more serious problems. Taking care of your feet is one of the most important things you can do for yourself.  HOME CARE  INSTRUCTIONS  Wear shoes at all times, even in the house. Do not go barefoot. Bare feet are easily injured.  Check your feet daily for blisters, cuts, and redness. If you cannot see the bottom of your feet, use a mirror or ask someone for help.  Wash your feet with warm water (do not use hot water) and mild soap. Then pat your feet and the areas between your toes until they are completely dry. Do not soak your feet as this can dry your skin.  Apply a moisturizing lotion or petroleum jelly (that does not contain alcohol and is unscented) to the skin on your feet and to dry, brittle toenails. Do not apply lotion between your toes.  Trim your toenails straight across. Do not dig under them or around the cuticle. File the edges of your nails with an emery board or nail file.  Do not cut corns or calluses or try to remove them with medicine.  Wear clean socks or stockings every day. Make sure they are not too tight. Do not wear knee-high stockings since they may decrease blood flow to your legs.  Wear shoes that fit properly and have enough cushioning. To break in new shoes, wear them for just a few hours a  day. This prevents you from injuring your feet. Always look in your shoes before you put them on to be sure there are no objects inside.  Do not cross your legs. This may decrease the blood flow to your feet.  If you find a minor scrape, cut, or break in the skin on your feet, keep it and the skin around it clean and dry. These areas may be cleansed with mild soap and water. Do not cleanse the area with peroxide, alcohol, or iodine.  When you remove an adhesive bandage, be sure not to damage the skin around it.  If you have a wound, look at it several times a day to make sure it is healing.  Do not use heating pads or hot water bottles. They may burn your skin. If you have lost feeling in your feet or legs, you may not know it is happening until it is too late.  Make sure your health care  provider performs a complete foot exam at least annually or more often if you have foot problems. Report any cuts, sores, or bruises to your health care provider immediately. SEEK MEDICAL CARE IF:   You have an injury that is not healing.  You have cuts or breaks in the skin.  You have an ingrown nail.  You notice redness on your legs or feet.  You feel burning or tingling in your legs or feet.  You have pain or cramps in your legs and feet.  Your legs or feet are numb.  Your feet always feel cold. SEEK IMMEDIATE MEDICAL CARE IF:   There is increasing redness, swelling, or pain in or around a wound.  There is a red line that goes up your leg.  Pus is coming from a wound.  You develop a fever or as directed by your health care provider.  You notice a bad smell coming from an ulcer or wound.   This information is not intended to replace advice given to you by your health care provider. Make sure you discuss any questions you have with your health care provider.   Document Released: 07/06/2000 Document Revised: 03/11/2013 Document Reviewed: 12/16/2012 Elsevier Interactive Patient Education 2016 [place] in the Home  Falls can cause injuries. They can happen to people of all ages. There are many things you can do to make your home safe and to help prevent falls.  WHAT CAN I DO ON THE OUTSIDE OF MY HOME?  Regularly fix the edges of walkways and driveways and fix any cracks.  Remove anything that might make you trip as you walk through a door, such as a raised step or threshold.  Trim any bushes or trees on the path to your home.  Use bright outdoor lighting.  Clear any walking paths of anything that might make someone trip, such as rocks or tools.  Regularly check to see if handrails are loose or broken. Make sure that both sides of any steps have handrails.  Any raised decks and porches should have guardrails on the edges.  Have any leaves,  snow, or ice cleared regularly.  Use sand or salt on walking paths during winter.  Clean up any spills in your garage right away. This includes oil or grease spills. WHAT CAN I DO IN THE BATHROOM?   Use night lights.  Install grab bars by the toilet and in the tub and shower. Do not use towel bars as grab bars.  Use non-skid  mats or decals in the tub or shower.  If you need to sit down in the shower, use a plastic, non-slip stool.  Keep the floor dry. Clean up any water that spills on the floor as soon as it happens.  Remove soap buildup in the tub or shower regularly.  Attach bath mats securely with double-sided non-slip rug tape.  Do not have throw rugs and other things on the floor that can make you trip. WHAT CAN I DO IN THE BEDROOM?  Use night lights.  Make sure that you have a light by your bed that is easy to reach.  Do not use any sheets or blankets that are too big for your bed. They should not hang down onto the floor.  Have a firm chair that has side arms. You can use this for support while you get dressed.  Do not have throw rugs and other things on the floor that can make you trip. WHAT CAN I DO IN THE KITCHEN?  Clean up any spills right away.  Avoid walking on wet floors.  Keep items that you use a lot in easy-to-reach places.  If you need to reach something above you, use a strong step stool that has a grab bar.  Keep electrical cords out of the way.  Do not use floor polish or wax that makes floors slippery. If you must use wax, use non-skid floor wax.  Do not have throw rugs and other things on the floor that can make you trip. WHAT CAN I DO WITH MY STAIRS?  Do not leave any items on the stairs.  Make sure that there are handrails on both sides of the stairs and use them. Fix handrails that are broken or loose. Make sure that handrails are as long as the stairways.  Check any carpeting to make sure that it is firmly attached to the stairs. Fix  any carpet that is loose or worn.  Avoid having throw rugs at the top or bottom of the stairs. If you do have throw rugs, attach them to the floor with carpet tape.  Make sure that you have a light switch at the top of the stairs and the bottom of the stairs. If you do not have them, ask someone to add them for you. WHAT ELSE CAN I DO TO HELP PREVENT FALLS?  Wear shoes that:  Do not have high heels.  Have rubber bottoms.  Are comfortable and fit you well.  Are closed at the toe. Do not wear sandals.  If you use a stepladder:  Make sure that it is fully opened. Do not climb a closed stepladder.  Make sure that both sides of the stepladder are locked into place.  Ask someone to hold it for you, if possible.  Clearly mark and make sure that you can see:  Any grab bars or handrails.  First and last steps.  Where the edge of each step is.  Use tools that help you move around (mobility aids) if they are needed. These include:  Canes.  Walkers.  Scooters.  Crutches.  Turn on the lights when you go into a dark area. Replace any light bulbs as soon as they burn out.  Set up your furniture so you have a clear path. Avoid moving your furniture around.  If any of your floors are uneven, fix them.  If there are any pets around you, be aware of where they are.  Review your medicines with your doctor.  Some medicines can make you feel dizzy. This can increase your chance of falling. Ask your doctor what other things that you can do to help prevent falls.   This information is not intended to replace advice given to you by your health care provider. Make sure you discuss any questions you have with your health care provider.   Document Released: 05/05/2009 Document Revised: 11/23/2014 Document Reviewed: 08/13/2014 Elsevier Interactive Patient Education 2016 [place] Maintenance, Female Adopting a healthy lifestyle and getting preventive care can go a long way  to promote health and wellness. Talk with your health care provider about what schedule of regular examinations is right for you. This is a good chance for you to check in with your provider about disease prevention and staying healthy. In between checkups, there are plenty of things you can do on your own. Experts have done a lot of research about which lifestyle changes and preventive measures are most likely to keep you healthy. Ask your health care provider for more information. WEIGHT AND DIET  Eat a healthy diet  Be sure to include plenty of vegetables, fruits, low-fat dairy products, and lean protein.  Do not eat a lot of foods high in solid fats, added sugars, or salt.  Get regular exercise. This is one of the most important things you can do for your health.  Most adults should exercise for at least 150 minutes each week. The exercise should increase your heart rate and make you sweat (moderate-intensity exercise).  Most adults should also do strengthening exercises at least twice a week. This is in addition to the moderate-intensity exercise.  Maintain a healthy weight  Body mass index (BMI) is a measurement that can be used to identify possible weight problems. It estimates body fat based on height and weight. Your health care provider can help determine your BMI and help you achieve or maintain a healthy weight.  For females 28 years of age and older:   A BMI below 18.5 is considered underweight.  A BMI of 18.5 to 24.9 is normal.  A BMI of 25 to 29.9 is considered overweight.  A BMI of 30 and above is considered obese.  Watch levels of cholesterol and blood lipids  You should start having your blood tested for lipids and cholesterol at 77 years of age, then have this test every 5 years.  You may need to have your cholesterol levels checked more often if:  Your lipid or cholesterol levels are high.  You are older than 77 years of age.  You are at high risk for heart  disease.  CANCER SCREENING   Lung Cancer  Lung cancer screening is recommended for adults 24-19 years old who are at high risk for lung cancer because of a history of smoking.  A yearly low-dose CT scan of the lungs is recommended for people who:  Currently smoke.  Have quit within the past 15 years.  Have at least a 30-pack-year history of smoking. A pack year is smoking an average of one pack of cigarettes a day for 1 year.  Yearly screening should continue until it has been 15 years since you quit.  Yearly screening should stop if you develop a health problem that would prevent you from having lung cancer treatment.  Breast Cancer  Practice breast self-awareness. This means understanding how your breasts normally appear and feel.  It also means doing regular breast self-exams. Let your health care provider know about any changes,  no matter how small.  If you are in your 20s or 30s, you should have a clinical breast exam (CBE) by a health care provider every 1-3 years as part of a regular health exam.  If you are 13 or older, have a CBE every year. Also consider having a breast X-ray (mammogram) every year.  If you have a family history of breast cancer, talk to your health care provider about genetic screening.  If you are at high risk for breast cancer, talk to your health care provider about having an MRI and a mammogram every year.  Breast cancer gene (BRCA) assessment is recommended for women who have family members with BRCA-related cancers. BRCA-related cancers include:  Breast.  Ovarian.  Tubal.  Peritoneal cancers.  Results of the assessment will determine the need for genetic counseling and BRCA1 and BRCA2 testing. Cervical Cancer Your health care provider may recommend that you be screened regularly for cancer of the pelvic organs (ovaries, uterus, and vagina). This screening involves a pelvic examination, including checking for microscopic changes to the  surface of your cervix (Pap test). You may be encouraged to have this screening done every 3 years, beginning at age 71.  For women ages 62-65, health care providers may recommend pelvic exams and Pap testing every 3 years, or they may recommend the Pap and pelvic exam, combined with testing for human papilloma virus (HPV), every 5 years. Some types of HPV increase your risk of cervical cancer. Testing for HPV may also be done on women of any age with unclear Pap test results.  Other health care providers may not recommend any screening for nonpregnant women who are considered low risk for pelvic cancer and who do not have symptoms. Ask your health care provider if a screening pelvic exam is right for you.  If you have had past treatment for cervical cancer or a condition that could lead to cancer, you need Pap tests and screening for cancer for at least 20 years after your treatment. If Pap tests have been discontinued, your risk factors (such as having a new sexual partner) need to be reassessed to determine if screening should resume. Some women have medical problems that increase the chance of getting cervical cancer. In these cases, your health care provider may recommend more frequent screening and Pap tests. Colorectal Cancer  This type of cancer can be detected and often prevented.  Routine colorectal cancer screening usually begins at 77 years of age and continues through 77 years of age.  Your health care provider may recommend screening at an earlier age if you have risk factors for colon cancer.  Your health care provider may also recommend using home test kits to check for hidden blood in the stool.  A small camera at the end of a tube can be used to examine your colon directly (sigmoidoscopy or colonoscopy). This is done to check for the earliest forms of colorectal cancer.  Routine screening usually begins at age 44.  Direct examination of the colon should be repeated every 5-10  years through 77 years of age. However, you may need to be screened more often if early forms of precancerous polyps or small growths are found. Skin Cancer  Check your skin from head to toe regularly.  Tell your health care provider about any new moles or changes in moles, especially if there is a change in a mole's shape or color.  Also tell your health care provider if you have a mole  that is larger than the size of a pencil eraser.  Always use sunscreen. Apply sunscreen liberally and repeatedly throughout the day.  Protect yourself by wearing long sleeves, pants, a wide-brimmed hat, and sunglasses whenever you are outside. HEART DISEASE, DIABETES, AND HIGH BLOOD PRESSURE   High blood pressure causes heart disease and increases the risk of stroke. High blood pressure is more likely to develop in:  People who have blood pressure in the high end of the normal range (130-139/85-89 mm Hg).  People who are overweight or obese.  People who are African American.  If you are 41-75 years of age, have your blood pressure checked every 3-5 years. If you are 66 years of age or older, have your blood pressure checked every year. You should have your blood pressure measured twice--once when you are at a hospital or clinic, and once when you are not at a hospital or clinic. Record the average of the two measurements. To check your blood pressure when you are not at a hospital or clinic, you can use:  An automated blood pressure machine at a pharmacy.  A home blood pressure monitor.  If you are between 65 years and 64 years old, ask your health care provider if you should take aspirin to prevent strokes.  Have regular diabetes screenings. This involves taking a blood sample to check your fasting blood sugar level.  If you are at a normal weight and have a low risk for diabetes, have this test once every three years after 77 years of age.  If you are overweight and have a high risk for diabetes,  consider being tested at a younger age or more often. PREVENTING INFECTION  Hepatitis B  If you have a higher risk for hepatitis B, you should be screened for this virus. You are considered at high risk for hepatitis B if:  You were born in a country where hepatitis B is common. Ask your health care provider which countries are considered high risk.  Your parents were born in a high-risk country, and you have not been immunized against hepatitis B (hepatitis B vaccine).  You have HIV or AIDS.  You use needles to inject street drugs.  You live with someone who has hepatitis B.  You have had sex with someone who has hepatitis B.  You get hemodialysis treatment.  You take certain medicines for conditions, including cancer, organ transplantation, and autoimmune conditions. Hepatitis C  Blood testing is recommended for:  Everyone born from [birth year] through 1967.  Anyone with known risk factors for hepatitis C. Sexually transmitted infections (STIs)  You should be screened for sexually transmitted infections (STIs) including gonorrhea and chlamydia if:  You are sexually active and are younger than 77 years of age.  You are older than 77 years of age and your health care provider tells you that you are at risk for this type of infection.  Your sexual activity has changed since you were last screened and you are at an increased risk for chlamydia or gonorrhea. Ask your health care provider if you are at risk.  If you do not have HIV, but are at risk, it may be recommended that you take a prescription medicine daily to prevent HIV infection. This is called pre-exposure prophylaxis (PrEP). You are considered at risk if:  You are sexually active and do not regularly use condoms or know the HIV status of your partner(s).  You take drugs by injection.  You are sexually active  with a partner who has HIV. Talk with your health care provider about whether you are at high risk of being  infected with HIV. If you choose to begin PrEP, you should first be tested for HIV. You should then be tested every 3 months for as long as you are taking PrEP.  PREGNANCY   If you are premenopausal and you may become pregnant, ask your health care provider about preconception counseling.  If you may become pregnant, take 400 to 800 micrograms (mcg) of folic acid every day.  If you want to prevent pregnancy, talk to your health care provider about birth control (contraception). OSTEOPOROSIS AND MENOPAUSE   Osteoporosis is a disease in which the bones lose minerals and strength with aging. This can result in serious bone fractures. Your risk for osteoporosis can be identified using a bone density scan.  If you are 67 years of age or older, or if you are at risk for osteoporosis and fractures, ask your health care provider if you should be screened.  Ask your health care provider whether you should take a calcium or vitamin D supplement to lower your risk for osteoporosis.  Menopause may have certain physical symptoms and risks.  Hormone replacement therapy may reduce some of these symptoms and risks. Talk to your health care provider about whether hormone replacement therapy is right for you.  HOME CARE INSTRUCTIONS   Schedule regular health, dental, and eye exams.  Stay current with your immunizations.   Do not use any tobacco products including cigarettes, chewing tobacco, or electronic cigarettes.  If you are pregnant, do not drink alcohol.  If you are breastfeeding, limit how much and how often you drink alcohol.  Limit alcohol intake to no more than 1 drink per day for nonpregnant women. One drink equals 12 ounces of beer, 5 ounces of wine, or 1 ounces of hard liquor.  Do not use street drugs.  Do not share needles.  Ask your health care provider for help if you need support or information about quitting drugs.  Tell your health care provider if you often feel  depressed.  Tell your health care provider if you have ever been abused or do not feel safe at home.   This information is not intended to replace advice given to you by your health care provider. Make sure you discuss any questions you have with your health care provider.   Document Released: 01/22/2011 Document Revised: 07/30/2014 Document Reviewed: 06/10/2013 Elsevier Interactive Patient Education [employer].

## 2016-02-24 ENCOUNTER — Other Ambulatory Visit: Payer: Self-pay | Admitting: Internal Medicine

## 2016-02-27 NOTE — Telephone Encounter (Signed)
RX faxed

## 2016-02-27 NOTE — Telephone Encounter (Signed)
Pt contacted and rx phoned into [facility] on [address] per pt request.

## 2016-03-01 DIAGNOSIS — Z6828 Body mass index (BMI) 28.0-28.9, adult: Secondary | ICD-10-CM | POA: Diagnosis not present

## 2016-03-01 DIAGNOSIS — M5412 Radiculopathy, cervical region: Secondary | ICD-10-CM | POA: Diagnosis not present

## 2016-03-07 ENCOUNTER — Other Ambulatory Visit: Payer: Self-pay | Admitting: Internal Medicine

## 2016-03-20 ENCOUNTER — Other Ambulatory Visit: Payer: Self-pay

## 2016-03-22 ENCOUNTER — Other Ambulatory Visit (INDEPENDENT_AMBULATORY_CARE_PROVIDER_SITE_OTHER): Payer: Medicare Other

## 2016-03-22 DIAGNOSIS — I10 Essential (primary) hypertension: Secondary | ICD-10-CM | POA: Diagnosis not present

## 2016-03-22 DIAGNOSIS — E782 Mixed hyperlipidemia: Secondary | ICD-10-CM

## 2016-03-22 DIAGNOSIS — E1329 Other specified diabetes mellitus with other diabetic kidney complication: Secondary | ICD-10-CM | POA: Diagnosis not present

## 2016-03-22 DIAGNOSIS — IMO0002 Reserved for concepts with insufficient information to code with codable children: Secondary | ICD-10-CM

## 2016-03-22 DIAGNOSIS — E1365 Other specified diabetes mellitus with hyperglycemia: Secondary | ICD-10-CM

## 2016-03-22 DIAGNOSIS — Z0000 Encounter for general adult medical examination without abnormal findings: Secondary | ICD-10-CM | POA: Diagnosis not present

## 2016-03-22 LAB — COMPREHENSIVE METABOLIC PANEL
ALBUMIN: 4.3 g/dL (ref 3.5–5.2)
ALK PHOS: 31 U/L — AB (ref 39–117)
ALT: 14 U/L (ref 0–35)
AST: 14 U/L (ref 0–37)
BUN: 16 mg/dL (ref 6–23)
CHLORIDE: 102 meq/L (ref 96–112)
CO2: 27 mEq/L (ref 19–32)
Calcium: 9.6 mg/dL (ref 8.4–10.5)
Creatinine, Ser: 0.69 mg/dL (ref 0.40–1.20)
GFR: 87.55 mL/min (ref >60.00)
GLUCOSE: 193 mg/dL — AB (ref 70–99)
POTASSIUM: 4.2 meq/L (ref 3.5–5.1)
SODIUM: 136 meq/L (ref 135–145)
Total Bilirubin: 0.8 mg/dL (ref 0.2–1.2)
Total Protein: 6.8 g/dL (ref 6.0–8.3)

## 2016-03-22 LAB — CBC WITH DIFFERENTIAL/PLATELET
BASOS PCT: 0.3 % (ref 0.0–3.0)
Basophils Absolute: 0 10*3/uL (ref 0.0–0.1)
EOS PCT: 2.4 % (ref 0.0–5.0)
Eosinophils Absolute: 0.2 10*3/uL (ref 0.0–0.7)
HCT: 37.1 % (ref 36.0–46.0)
Hemoglobin: 13 g/dL (ref 12.0–15.0)
LYMPHS ABS: 1.9 10*3/uL (ref 0.7–4.0)
Lymphocytes Relative: 23.8 % (ref 12.0–46.0)
MCHC: 35.1 g/dL (ref 30.0–36.0)
MCV: 88.8 fl (ref 78.0–100.0)
MONO ABS: 0.6 10*3/uL (ref 0.1–1.0)
MONOS PCT: 7.4 % (ref 3.0–12.0)
NEUTROS PCT: 66.1 % (ref 43.0–77.0)
Neutro Abs: 5.4 10*3/uL (ref 1.4–7.7)
Platelets: 271 10*3/uL (ref 150.0–400.0)
RBC: 4.18 Mil/uL (ref 3.87–5.11)
RDW: 13.6 % (ref 11.5–15.5)
WBC: 8.1 10*3/uL (ref 4.0–10.5)

## 2016-03-22 LAB — LIPID PANEL
Cholesterol: 100 mg/dL (ref 0–200)
HDL: 33 mg/dL — AB (ref >39.00)
LDL Cholesterol: 44 mg/dL (ref 0–99)
NONHDL: 66.53
TRIGLYCERIDES: 113 mg/dL (ref 0.0–149.0)
Total CHOL/HDL Ratio: 3
VLDL: 22.6 mg/dL (ref 0.0–40.0)

## 2016-03-22 LAB — TSH: TSH: 2.87 u[IU]/mL (ref 0.35–4.50)

## 2016-03-22 LAB — T4, FREE: Free T4: 0.91 ng/dL (ref 0.60–1.60)

## 2016-03-22 LAB — HEMOGLOBIN A1C: HEMOGLOBIN A1C: 7.6 % — AB (ref 4.6–6.5)

## 2016-03-23 ENCOUNTER — Other Ambulatory Visit: Payer: Self-pay | Admitting: Internal Medicine

## 2016-03-28 ENCOUNTER — Ambulatory Visit (INDEPENDENT_AMBULATORY_CARE_PROVIDER_SITE_OTHER): Payer: Medicare Other | Admitting: Internal Medicine

## 2016-03-28 ENCOUNTER — Other Ambulatory Visit: Payer: Self-pay | Admitting: Internal Medicine

## 2016-03-28 ENCOUNTER — Telehealth: Payer: Self-pay | Admitting: Emergency Medicine

## 2016-03-28 ENCOUNTER — Encounter: Payer: Self-pay | Admitting: Internal Medicine

## 2016-03-28 VITALS — BP 140/72 | HR 80 | Temp 97.9°F | Resp 16 | Wt 171.0 lb

## 2016-03-28 DIAGNOSIS — E1365 Other specified diabetes mellitus with hyperglycemia: Secondary | ICD-10-CM

## 2016-03-28 DIAGNOSIS — I10 Essential (primary) hypertension: Secondary | ICD-10-CM | POA: Diagnosis not present

## 2016-03-28 DIAGNOSIS — Z23 Encounter for immunization: Secondary | ICD-10-CM

## 2016-03-28 DIAGNOSIS — Z780 Asymptomatic menopausal state: Secondary | ICD-10-CM

## 2016-03-28 DIAGNOSIS — IMO0002 Reserved for concepts with insufficient information to code with codable children: Secondary | ICD-10-CM

## 2016-03-28 DIAGNOSIS — Z13820 Encounter for screening for osteoporosis: Secondary | ICD-10-CM

## 2016-03-28 DIAGNOSIS — F411 Generalized anxiety disorder: Secondary | ICD-10-CM

## 2016-03-28 DIAGNOSIS — E1329 Other specified diabetes mellitus with other diabetic kidney complication: Secondary | ICD-10-CM | POA: Diagnosis not present

## 2016-03-28 DIAGNOSIS — E782 Mixed hyperlipidemia: Secondary | ICD-10-CM | POA: Diagnosis not present

## 2016-03-28 DIAGNOSIS — M8580 Other specified disorders of bone density and structure, unspecified site: Secondary | ICD-10-CM

## 2016-03-28 MED ORDER — CLONAZEPAM 0.5 MG PO TABS: ORAL_TABLET | ORAL | 2 refills | Status: DC

## 2016-03-28 MED ORDER — SITAGLIPTIN PHOSPHATE 50 MG PO TABS: 50.0000 mg | ORAL_TABLET | Freq: Every day | ORAL | 5 refills | Status: DC

## 2016-03-28 NOTE — Progress Notes (Signed)
Subjective:    Patient ID: [name], female    DOB: Mar 05, 1941, 75 y.o.   MRN: [id number]  HPI The patient is here for follow up.  Diabetes: She is taking her medication daily as prescribed. She is compliant with a diabetic diet. She is exercising regularly - water aerobics three times a week. She monitors her sugars and they have been running low 200s in morning. She checks her feet daily and denies foot lesions. She is up-to-date with an ophthalmology examination.   Hypertension: She is taking her medication daily. She is compliant with a low sodium diet.  She denies chest pain, palpitations, edema, shortness of breath and regular headaches. She is exercising regularly.  She does not monitor her blood pressure at home.    Hyperlipidemia: She is taking her medication daily. She is compliant with a low fat/cholesterol diet. She is exercising regularly. She denies myalgias.   Osteopenia:she does some walking.  She is taking her calcium and vitamin d.   Anxiety: She is taking her medication daily as prescribed. She denies any side effects from the medication. She feels her anxiety is well controlled and she is happy with her current dose of medication.    Medications and allergies reviewed with patient and updated if appropriate.  Patient Active Problem List   Diagnosis Date Noted  . Generalized anxiety disorder 06/20/2015  . SUI (stress urinary incontinence, female) 02/05/2014  . 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 C V A/STROKE 05/31/2010  . DM (diabetes mellitus), secondary, uncontrolled, with renal complications ([place]) [id number]  . DYSURIA 01/24/2010  . THYROID NODULE, RIGHT 01/21/2009  . FATIGUE 07/12/2008  . Vitamin D deficiency 12/10/2007  . HYPERLIPIDEMIA 06/17/2007  . Essential hypertension 06/17/2007  . Osteopenia 06/17/2007  . Osteoarthrosis, unspecified whether generalized or localized, unspecified site 11/11/2006    Current Outpatient Prescriptions on File  Prior to Visit  Medication Sig Dispense Refill  . aspirin 81 MG tablet Take 81 mg by mouth at bedtime.     . benazepril (LOTENSIN) 20 MG tablet TAKE 1 TABLET DAILY 90 tablet 2  . Blood Glucose Monitoring Suppl (FREESTYLE LITE) DEVI Use meter daily to check blood sugar as instructed. 1 each 0  . calcium citrate-vitamin D 500-400 MG-UNIT chewable tablet Chew 1 tablet by mouth at bedtime.     . Cholecalciferol (VITAMIN D3) 1000 UNITS CAPS Take 1 capsule by mouth daily.    . citalopram (CELEXA) 20 MG tablet TAKE 1 TABLET DAILY 90 tablet 0  . clonazePAM (KLONOPIN) 0.5 MG tablet TAKE ONE TABLET BY MOUTH ONCE DAILY AS NEEDED FOR ANXIETY 30 tablet 0  . CRANBERRY PO Take 300 mg by mouth daily.    . cycloSPORINE (RESTASIS) 0.05 % ophthalmic emulsion Place 1 drop into both eyes 2 (two) times daily.    Marland Kitchen FREESTYLE LITE test strip USE TO CHECK BLOOD SUGAR DAILY AS DIRECTED 100 each 2  . glimepiride (AMARYL) 2 MG tablet Take 2 mg by mouth 2 (two) times daily.    Marland Kitchen glimepiride (AMARYL) 2 MG tablet Take 1 tablet (2 mg total) by mouth daily before breakfast. 90 tablet 1  . Lancets (FREESTYLE) lancets Use as instructed 100 each 12  . metFORMIN (GLUCOPHAGE) 1000 MG tablet TAKE 1 TABLET WITH BREAKFAST, ONE-HALF (1/2) TABLET WITH LUNCH AND 1 TABLET WITH DINNER 225 tablet 1  . methylcellulose (ARTIFICIAL TEARS) 1 % ophthalmic solution Place 1 drop into both eyes daily as needed (itching eyes).     Marland Kitchen  Multiple Vitamins-Calcium (ONE-A-DAY WOMENS FORMULA) TABS Take 1 tablet by mouth daily.    . pravastatin (PRAVACHOL) 40 MG tablet TAKE 1 TABLET AT BEDTIME 90 tablet 2  . traMADol (ULTRAM) 50 MG tablet Take 1 tablet (50 mg total) by mouth every 12 (twelve) hours as needed for severe pain. 10 tablet 0   No current facility-administered medications on file prior to visit.     Past Medical History:  Diagnosis Date  . Anxiety   . Arthritis HANDS, NECK , BACK  . Asymptomatic carotid artery stenosis LEFT --  MOD. PER DR  WILLIS NOTE OCT 2012  . Benign heart murmur   . Depression    PMH of  . Diabetes mellitus ORAL MED  . DVT (deep venous thrombosis) ([place]) 2006   after arthroscopy  . Frequency of urination   . History of CVA (cerebrovascular accident) NEUROLOGIST  DR WILLIS----  06-01-2010-  LEFT BASAL GANGLIA HEMORRAGE   RESIDUAL RIGHT HAND NUMBNESS/TINGLING  . Hyperlipidemia   . Hypertension   . MRSA (methicillin resistant staph aureus) culture positive    X3; last post TKR  . Nocturia   . Numbness and tingling in right hand RESIDUAL FROM CVA NOV 2011  . Obesity    Redux therapy  . Osteoporosis   . Thyroid disease    right thyroid nodule-- BX DONE 08-15-2011    Past Surgical History:  Procedure Laterality Date  . ABDOMINAL HYSTERECTOMY  1973   Endometriosis & fibroid  . CARPAL TUNNEL RELEASE  2000   RIGHT  . COLONOSCOPY  1999 & 2005   Dr Olevia Perches  . cystoscope     to evaluate recurrent UTIs  . CYSTOSCOPY W/ RETROGRADES  08/22/2011   Procedure: CYSTOSCOPY WITH RETROGRADE PYELOGRAM;  Surgeon: Molli Hazard, MD;  Location: [facility];  Service: Urology;  Laterality: Bilateral;  . CYSTOSCOPY WITH BIOPSY  08/22/2011   Procedure: CYSTOSCOPY WITH BIOPSY;  Surgeon: Molli Hazard, MD;  Location: [facility];  Service: Urology;  Laterality: N/A;  . g2 p2    . JOINT REPLACEMENT  03-28-2006   LEFT THUMB  . KNEE ARTHROSCOPY     BILATERAL PRIOR TO TOTAL KNEE  . LEFT SHOULDER SURG  1990'S  . LUMBAR LAMINECTOMY  08-26-2009   L 4 - 5  . THYROID NODULE BX  08-15-2011   RIGHT  . TOTAL KNEE ARTHROPLASTY  05-26-2002      LEFT   AND RIGHT  03-30-2098  . WRIST SURGERY  2014   tendon placed in joint; Dr Burney Gauze    Social History   Social History  . Marital status: Widowed    Spouse name: N/A  . Number of children: N/A  . Years of education: N/A   Social History Main Topics  . Smoking status: Never Smoker  . Smokeless tobacco: Never Used  . Alcohol  use Yes     Comment:  very rarely , < 1 glass wine / month  . Drug use: No  . Sexual activity: Not on file   Other Topics Concern  . Not on file   Social History Narrative  . No narrative on file    Family History  Problem Relation Age of Onset  . Bladder Cancer Mother   . Diabetes Mother   . Brain cancer Sister   . Prostate cancer Brother   . Alzheimer's disease Brother   . Diabetes Maternal Aunt   . Diabetes Maternal Uncle   . Diabetes Maternal  Grandmother     Type 2  . Diabetes Maternal Grandfather     Type 2  . Stroke Paternal Grandfather     in 68s  . Diabetes Sister 63    TYPE 1   . Pancreatic cancer Sister   . Kidney failure Sister     in context of DM & influenza  . Heart disease Neg Hx     Review of Systems  Constitutional: Negative for chills and fever.  Respiratory: Positive for cough (at night - dry). Negative for shortness of breath and wheezing.   Cardiovascular: Negative for chest pain, palpitations and leg swelling.  Gastrointestinal: Negative for abdominal pain and nausea.  Neurological: Positive for headaches (from neck). Negative for light-headedness.       Objective:   Vitals:   03/28/16 1333  BP: 140/72  Pulse: 80  Resp: 16  Temp: 97.9 F (36.6 C)   Filed Weights   03/28/16 1333  Weight: 171 lb (77.6 kg)   Body mass index is 27.6 kg/m.   Physical Exam    Constitutional: Appears well-developed and well-nourished. No distress.  HENT:  Head: Normocephalic and atraumatic.  Neck: Neck supple. No tracheal deviation present. No thyromegaly present.  Cardiovascular: Normal rate, regular rhythm and normal heart sounds.   No murmur heard. No carotid bruit  Pulmonary/Chest: Effort normal and breath sounds normal. No respiratory distress. No has no wheezes. No rales.  Musculoskeletal: No edema.  Lymphadenopathy: No cervical adenopathy.  Skin: Skin is warm and dry. Not diaphoretic.  Psychiatric: Normal mood and affect. Behavior is  normal.     Assessment & Plan:    Flu vaccine today  See Problem List for Assessment and Plan of chronic medical problems.

## 2016-03-28 NOTE — Assessment & Plan Note (Signed)
Wt Readings from Last 3 Encounters:  03/28/16 171 lb (77.6 kg)  02/21/16 176 lb (79.8 kg)  01/18/16 180 lb (81.6 kg)    BP well controlled overall Current regimen effective and well tolerated Continue current medications at current doses

## 2016-03-28 NOTE — Assessment & Plan Note (Signed)
a1c 7.6 Sugars higher than ideal Will try adding [place] Continue glimepiride and metformin at current dose Continue diabetic diet and regular exercise

## 2016-03-28 NOTE — Telephone Encounter (Signed)
Pt called and states she has been prescribed metformin, glimepiride (AMARYL) 2 MG tablet and sitaGLIPtin (JANUVIA) 50 MG tablet. She wants to know if she needs to take all 3 or just some of them. Please advise thanks.

## 2016-03-28 NOTE — Patient Instructions (Addendum)
  Test(s) ordered today. Your results will be released to [place] (or called to you) after review, usually within 72hours after test completion. If any changes need to be made, you will be notified at that same time.  All other Health Maintenance issues reviewed.   All recommended immunizations and age-appropriate screenings are up-to-date or discussed.  No immunizations administered today.   Medications reviewed and updated.  Changes include adding januvia to your regimen.  If you do ok with this we can send it to your mail away pharmacy.   Your prescription(s) have been submitted to your pharmacy. Please take as directed and contact our office if you believe you are having problem(s) with the medication(s).   Please followup in 6 months

## 2016-03-28 NOTE — Telephone Encounter (Signed)
All three and depending on how her sugars are we will be able to decrease of the them.    Have her call back in 3 weeks with her sugar numbers to see if we need to adjust the meds.    She should call sooner if any questions or concern.s

## 2016-03-28 NOTE — Telephone Encounter (Signed)
Please advise 

## 2016-03-28 NOTE — Assessment & Plan Note (Signed)
Controlled continue statin

## 2016-03-28 NOTE — Assessment & Plan Note (Signed)
dexa ordered Exercising - walking some Continue calcium and vitamin d

## 2016-03-28 NOTE — Assessment & Plan Note (Signed)
Controlled, stable Continue current dose of medication  

## 2016-03-29 NOTE — Telephone Encounter (Signed)
LVM informing pt

## 2016-04-20 ENCOUNTER — Telehealth: Payer: Self-pay | Admitting: Internal Medicine

## 2016-04-20 NOTE — Telephone Encounter (Signed)
Please advise 

## 2016-04-20 NOTE — Telephone Encounter (Signed)
Patient states she was to call back to let MD know how Januvia was working.  Patient states sugar levels are better.  Patient is [facility]   Patient is also needing refill on klonopin [facility]

## 2016-04-21 NOTE — Telephone Encounter (Signed)
Ok for both

## 2016-04-23 MED ORDER — SITAGLIPTIN PHOSPHATE 50 MG PO TABS: 50.0000 mg | ORAL_TABLET | Freq: Every day | ORAL | 1 refills | Status: DC

## 2016-04-23 MED ORDER — CLONAZEPAM 0.5 MG PO TABS: ORAL_TABLET | ORAL | 0 refills | Status: DC

## 2016-04-23 NOTE — Telephone Encounter (Signed)
RX faxed to mail order pof

## 2016-04-24 ENCOUNTER — Other Ambulatory Visit: Payer: Medicare Other

## 2016-04-24 DIAGNOSIS — H35341 Macular cyst, hole, or pseudohole, right eye: Secondary | ICD-10-CM | POA: Diagnosis not present

## 2016-04-24 DIAGNOSIS — H35371 Puckering of macula, right eye: Secondary | ICD-10-CM | POA: Diagnosis not present

## 2016-04-24 DIAGNOSIS — H43811 Vitreous degeneration, right eye: Secondary | ICD-10-CM | POA: Diagnosis not present

## 2016-04-25 ENCOUNTER — Other Ambulatory Visit: Payer: Self-pay | Admitting: Emergency Medicine

## 2016-04-25 DIAGNOSIS — E1365 Other specified diabetes mellitus with hyperglycemia: Principal | ICD-10-CM

## 2016-04-25 DIAGNOSIS — E1329 Other specified diabetes mellitus with other diabetic kidney complication: Secondary | ICD-10-CM

## 2016-04-25 DIAGNOSIS — IMO0002 Reserved for concepts with insufficient information to code with codable children: Secondary | ICD-10-CM

## 2016-04-25 MED ORDER — FREESTYLE LITE DEVI: 3 refills | Status: DC

## 2016-04-26 ENCOUNTER — Ambulatory Visit (INDEPENDENT_AMBULATORY_CARE_PROVIDER_SITE_OTHER)
Admission: RE | Admit: 2016-04-26 | Discharge: 2016-04-26 | Disposition: A | Discharge status: Home / Self Care | Payer: Medicare Other | Source: Ambulatory Visit | Attending: Internal Medicine | Admitting: Internal Medicine

## 2016-04-26 DIAGNOSIS — M8580 Other specified disorders of bone density and structure, unspecified site: Secondary | ICD-10-CM

## 2016-04-26 DIAGNOSIS — Z780 Asymptomatic menopausal state: Secondary | ICD-10-CM

## 2016-04-26 DIAGNOSIS — Z13820 Encounter for screening for osteoporosis: Secondary | ICD-10-CM

## 2016-04-27 ENCOUNTER — Inpatient Hospital Stay: Admission: RE | Admit: 2016-04-27 | Payer: Medicare Other | Source: Ambulatory Visit

## 2016-05-10 ENCOUNTER — Other Ambulatory Visit: Payer: Self-pay | Admitting: Emergency Medicine

## 2016-05-10 MED ORDER — FREESTYLE LANCETS MISC: 12 refills | Status: DC

## 2016-05-24 DIAGNOSIS — H5201 Hypermetropia, right eye: Secondary | ICD-10-CM | POA: Diagnosis not present

## 2016-05-24 DIAGNOSIS — H35341 Macular cyst, hole, or pseudohole, right eye: Secondary | ICD-10-CM | POA: Diagnosis not present

## 2016-05-24 DIAGNOSIS — H52223 Regular astigmatism, bilateral: Secondary | ICD-10-CM | POA: Diagnosis not present

## 2016-05-24 DIAGNOSIS — Z7984 Long term (current) use of oral hypoglycemic drugs: Secondary | ICD-10-CM | POA: Diagnosis not present

## 2016-05-24 DIAGNOSIS — E119 Type 2 diabetes mellitus without complications: Secondary | ICD-10-CM | POA: Diagnosis not present

## 2016-05-24 DIAGNOSIS — H524 Presbyopia: Secondary | ICD-10-CM | POA: Diagnosis not present

## 2016-05-24 DIAGNOSIS — H43813 Vitreous degeneration, bilateral: Secondary | ICD-10-CM | POA: Diagnosis not present

## 2016-05-24 DIAGNOSIS — H35342 Macular cyst, hole, or pseudohole, left eye: Secondary | ICD-10-CM | POA: Diagnosis not present

## 2016-05-24 LAB — HM DIABETES EYE EXAM

## 2016-06-07 ENCOUNTER — Encounter: Payer: Self-pay | Admitting: Internal Medicine

## 2016-06-07 ENCOUNTER — Telehealth: Payer: Self-pay | Admitting: *Deleted

## 2016-06-07 MED ORDER — GLIMEPIRIDE 2 MG PO TABS: 2.0000 mg | ORAL_TABLET | Freq: Two times a day (BID) | ORAL | 1 refills | Status: DC

## 2016-06-07 NOTE — Telephone Encounter (Signed)
Rec'd call pt [facility]. Sent electronically...[name]

## 2016-06-18 DIAGNOSIS — N390 Urinary tract infection, site not specified: Secondary | ICD-10-CM | POA: Diagnosis not present

## 2016-06-21 ENCOUNTER — Other Ambulatory Visit: Payer: Self-pay | Admitting: Internal Medicine

## 2016-06-25 ENCOUNTER — Encounter: Payer: Self-pay | Admitting: Nurse Practitioner

## 2016-06-25 ENCOUNTER — Ambulatory Visit (INDEPENDENT_AMBULATORY_CARE_PROVIDER_SITE_OTHER): Payer: Medicare Other | Admitting: Nurse Practitioner

## 2016-06-25 VITALS — BP 132/72 | HR 62 | Temp 97.4°F | Ht 66.0 in | Wt 183.0 lb

## 2016-06-25 DIAGNOSIS — N3001 Acute cystitis with hematuria: Secondary | ICD-10-CM

## 2016-06-25 LAB — POCT URINALYSIS DIPSTICK
BILIRUBIN UA: NEGATIVE
GLUCOSE UA: NEGATIVE
KETONES UA: NEGATIVE
Nitrite, UA: NEGATIVE
PH UA: 6
Protein, UA: NEGATIVE
RBC UA: 1
SPEC GRAV UA: 1.025
Urobilinogen, UA: 0.2

## 2016-06-25 MED ORDER — PHENAZOPYRIDINE HCL 100 MG PO TABS: 100.0000 mg | ORAL_TABLET | Freq: Three times a day (TID) | ORAL | 0 refills | Status: DC | PRN

## 2016-06-25 MED ORDER — NITROFURANTOIN MONOHYD MACRO 100 MG PO CAPS: 100.0000 mg | ORAL_CAPSULE | Freq: Two times a day (BID) | ORAL | 0 refills | Status: AC

## 2016-06-25 NOTE — Patient Instructions (Addendum)
Encourage adequate oral hydration Return to office if no improvement in 1week.  Urine culture done by [facility] urology: Dr. Carlota Raspberry. Urine culture positive for Staphylococcus.

## 2016-06-25 NOTE — Progress Notes (Signed)
Pre visit review using our clinic review tool, if applicable. No additional management support is needed unless otherwise documented below in the visit note. 

## 2016-06-25 NOTE — Progress Notes (Signed)
Subjective:  Patient ID: Guillermina [place], female    DOB: 30-Aug-1940  Age: 75 y.o. MRN: [id number]  CC: Urinary Tract Infection (burning,back pain,chill for 1 wk. took Cipro)   Urinary Tract Infection   This is a new problem. The current episode started in the past 7 days. The problem occurs every urination. The problem has been gradually worsening. The quality of the pain is described as burning and aching. There has been no fever. She is not sexually active. There is no history of pyelonephritis. Associated symptoms include frequency. Pertinent negatives include no chills, hematuria, nausea or urgency. She has tried increased fluids for the symptoms. The treatment provided no relief.  She was evaluated by her urologist with [facility] 06/18/2016. Urine culture was done but patient was not contacted with results.  Outpatient Medications Prior to Visit  Medication Sig Dispense Refill  . aspirin 81 MG tablet Take 81 mg by mouth at bedtime.     . benazepril (LOTENSIN) 20 MG tablet TAKE 1 TABLET DAILY 90 tablet 2  . Blood Glucose Monitoring Suppl (FREESTYLE LITE) DEVI Use meter daily to check blood sugar as instructed. 1 each 3  . calcium citrate-vitamin D 500-400 MG-UNIT chewable tablet Chew 1 tablet by mouth at bedtime.     . Cholecalciferol (VITAMIN D3) 1000 UNITS CAPS Take 1 capsule by mouth daily.    . citalopram (CELEXA) 20 MG tablet TAKE 1 TABLET DAILY 90 tablet 1  . clonazePAM (KLONOPIN) 0.5 MG tablet TAKE ONE TABLET BY MOUTH ONCE DAILY AS NEEDED FOR ANXIETY 90 tablet 0  . CRANBERRY PO Take 300 mg by mouth daily.    . cycloSPORINE (RESTASIS) 0.05 % ophthalmic emulsion Place 1 drop into both eyes 2 (two) times daily.    Marland Kitchen FREESTYLE LITE test strip USE TO CHECK BLOOD SUGAR DAILY AS DIRECTED 100 each 2  . glimepiride (AMARYL) 2 MG tablet Take 1 tablet (2 mg total) by mouth 2 (two) times daily. 180 tablet 1  . Lancets (FREESTYLE) lancets Use as instructed 100 each 12  . metFORMIN  (GLUCOPHAGE) 1000 MG tablet TAKE 1 TABLET WITH BREAKFAST, ONE-HALF (1/2) TABLET WITH LUNCH AND 1 TABLET WITH DINNER 225 tablet 1  . methylcellulose (ARTIFICIAL TEARS) 1 % ophthalmic solution Place 1 drop into both eyes daily as needed (itching eyes).     . Multiple Vitamins-Calcium (ONE-A-DAY WOMENS FORMULA) TABS Take 1 tablet by mouth daily.    . pravastatin (PRAVACHOL) 40 MG tablet TAKE 1 TABLET AT BEDTIME 90 tablet 2  . sitaGLIPtin (JANUVIA) 50 MG tablet Take 1 tablet (50 mg total) by mouth daily. 90 tablet 1   No facility-administered medications prior to visit.     ROS See HPI  Objective:  BP 132/72   Pulse 62   Temp 97.4 F (36.3 C)   Ht 5\' 6"  (1.676 m)   Wt 183 lb (83 kg)   SpO2 97%   BMI 29.54 kg/m   BP Readings from Last 3 Encounters:  06/25/16 132/72  03/28/16 140/72  02/21/16 110/60    Wt Readings from Last 3 Encounters:  06/25/16 183 lb (83 kg)  03/28/16 171 lb (77.6 kg)  02/21/16 176 lb (79.8 kg)    Physical Exam  Constitutional: She is oriented to person, place, and time. No distress.  Cardiovascular: Normal rate.   Pulmonary/Chest: Effort normal.  Abdominal: Soft. She exhibits no distension. There is tenderness. There is no rebound.  Neurological: She is alert and oriented to person, place,  and time.  Vitals reviewed.   Lab Results  Component Value Date   WBC 8.1 03/22/2016   HGB 13.0 03/22/2016   HCT 37.1 03/22/2016   PLT 271.0 03/22/2016   GLUCOSE 193 (H) 03/22/2016   CHOL 100 03/22/2016   TRIG 113.0 03/22/2016   HDL 33.00 (L) 03/22/2016   LDLCALC 44 03/22/2016   ALT 14 03/22/2016   AST 14 03/22/2016   NA 136 03/22/2016   K 4.2 03/22/2016   CL 102 03/22/2016   CREATININE 0.69 03/22/2016   BUN 16 03/22/2016   CO2 27 03/22/2016   TSH 2.87 03/22/2016   INR 1.8 (H) 04/02/2008   HGBA1C 7.6 (H) 03/22/2016   MICROALBUR <0.7 01/12/2015    Dg Bone Density  Result Date: 04/29/2016 Date of study: 04/26/16 Exam: DUAL X-RAY ABSORPTIOMETRY  (DXA) FOR BONE MINERAL DENSITY (BMD) Instrument: [employer] Provider: PCP Indication: screening for osteoporosis Comparison: none (please note that it is not possible to compare data from different instruments) Clinical data: Pt is a postmenopausal 75 y.o. female with previous fractures. Results:  Lumbar spine (L1-L4) Femoral neck (FN) 33% distal radius T-score -0.1 RFN:-0.7 LFN:-0.9 n/a Change in BMD from previous DXA test (%) Up 2.7% Up 2.9% n/a (*) statistically significant Assessment: the BMD is normal according to the [facility] classification for osteoporosis (see below). Fracture risk: low FRAX score: not calculated due to normal BMD Comments: the technical quality of the study is good  Degenerative change in the spine may falsely elevate BMD. Recommend optimizing calcium (1200 mg/day) and vitamin D (800 IU/day) intake. No pharmacological treatment is indicated. Followup: Repeat BMD is appropriate after 2 years. WHO criteria for diagnosis of osteoporosis in postmenopausal women and in men 46 y/o or older: - normal: T-score -1.0 to + 1.0 - osteopenia/low bone density: T-score between -2.5 and -1.0 - osteoporosis: T-score below -2.5 - severe osteoporosis: T-score below -2.5 with history of fragility fracture Note: although not part of the WHO classification, the presence of a fragility fracture, regardless of the T-score, should be considered diagnostic of osteoporosis, provided other causes for the fracture have been excluded. Loura Pardon MD    Assessment & Plan:   Zane was seen today for urinary tract infection.  Diagnoses and all orders for this visit:  Acute cystitis with hematuria -     POCT urinalysis dipstick -     nitrofurantoin, macrocrystal-monohydrate, (MACROBID) 100 MG capsule; Take 1 capsule (100 mg total) by mouth 2 (two) times daily. -     phenazopyridine (PYRIDIUM) 100 MG tablet; Take 1 tablet (100 mg total) by mouth 3 (three) times daily as needed for pain (with  food).   I am having Ms. Paganelli start on nitrofurantoin (macrocrystal-monohydrate) and phenazopyridine. I am also having her maintain her aspirin, Vitamin D3, calcium citrate-vitamin D, ONE-A-DAY WOMENS FORMULA, methylcellulose, FREESTYLE LITE, cycloSPORINE, CRANBERRY PO, pravastatin, benazepril, sitaGLIPtin, clonazePAM, FREESTYLE LITE, freestyle, glimepiride, citalopram, and metFORMIN.  Meds ordered this encounter  Medications  . nitrofurantoin, macrocrystal-monohydrate, (MACROBID) 100 MG capsule    Sig: Take 1 capsule (100 mg total) by mouth 2 (two) times daily.    Dispense:  20 capsule    Refill:  0    Order Specific Question:   Supervising Provider    Answer:   Cassandria Anger [[id number]]  . phenazopyridine (PYRIDIUM) 100 MG tablet    Sig: Take 1 tablet (100 mg total) by mouth 3 (three) times daily as needed for pain (with food).  Dispense:  10 tablet    Refill:  0    Order Specific Question:   Supervising Provider    Answer:   Cassandria Anger [[id number]]    Follow-up: No Follow-up on file.  Wilfred Lacy, NP

## 2016-06-25 NOTE — Progress Notes (Signed)
Reviewed with patient in office. See office note

## 2016-08-01 ENCOUNTER — Other Ambulatory Visit: Payer: Self-pay | Admitting: Emergency Medicine

## 2016-08-01 MED ORDER — CLONAZEPAM 0.5 MG PO TABS: ORAL_TABLET | ORAL | 0 refills | Status: DC

## 2016-08-01 NOTE — Telephone Encounter (Signed)
RX sent [facility]

## 2016-08-21 DIAGNOSIS — M50322 Other cervical disc degeneration at C5-C6 level: Secondary | ICD-10-CM | POA: Diagnosis not present

## 2016-08-21 DIAGNOSIS — M9902 Segmental and somatic dysfunction of thoracic region: Secondary | ICD-10-CM | POA: Diagnosis not present

## 2016-08-21 DIAGNOSIS — M62830 Muscle spasm of back: Secondary | ICD-10-CM | POA: Diagnosis not present

## 2016-08-21 DIAGNOSIS — M50323 Other cervical disc degeneration at C6-C7 level: Secondary | ICD-10-CM | POA: Diagnosis not present

## 2016-08-21 DIAGNOSIS — M9901 Segmental and somatic dysfunction of cervical region: Secondary | ICD-10-CM | POA: Diagnosis not present

## 2016-08-21 DIAGNOSIS — M546 Pain in thoracic spine: Secondary | ICD-10-CM | POA: Diagnosis not present

## 2016-08-22 DIAGNOSIS — M546 Pain in thoracic spine: Secondary | ICD-10-CM | POA: Diagnosis not present

## 2016-08-22 DIAGNOSIS — M50323 Other cervical disc degeneration at C6-C7 level: Secondary | ICD-10-CM | POA: Diagnosis not present

## 2016-08-22 DIAGNOSIS — M62830 Muscle spasm of back: Secondary | ICD-10-CM | POA: Diagnosis not present

## 2016-08-22 DIAGNOSIS — M9902 Segmental and somatic dysfunction of thoracic region: Secondary | ICD-10-CM | POA: Diagnosis not present

## 2016-08-22 DIAGNOSIS — M50322 Other cervical disc degeneration at C5-C6 level: Secondary | ICD-10-CM | POA: Diagnosis not present

## 2016-08-22 DIAGNOSIS — M9901 Segmental and somatic dysfunction of cervical region: Secondary | ICD-10-CM | POA: Diagnosis not present

## 2016-08-24 DIAGNOSIS — M62830 Muscle spasm of back: Secondary | ICD-10-CM | POA: Diagnosis not present

## 2016-08-24 DIAGNOSIS — M546 Pain in thoracic spine: Secondary | ICD-10-CM | POA: Diagnosis not present

## 2016-08-24 DIAGNOSIS — M9902 Segmental and somatic dysfunction of thoracic region: Secondary | ICD-10-CM | POA: Diagnosis not present

## 2016-08-24 DIAGNOSIS — M50323 Other cervical disc degeneration at C6-C7 level: Secondary | ICD-10-CM | POA: Diagnosis not present

## 2016-08-24 DIAGNOSIS — M50322 Other cervical disc degeneration at C5-C6 level: Secondary | ICD-10-CM | POA: Diagnosis not present

## 2016-08-24 DIAGNOSIS — M9901 Segmental and somatic dysfunction of cervical region: Secondary | ICD-10-CM | POA: Diagnosis not present

## 2016-08-27 DIAGNOSIS — M50323 Other cervical disc degeneration at C6-C7 level: Secondary | ICD-10-CM | POA: Diagnosis not present

## 2016-08-27 DIAGNOSIS — M9901 Segmental and somatic dysfunction of cervical region: Secondary | ICD-10-CM | POA: Diagnosis not present

## 2016-08-27 DIAGNOSIS — M546 Pain in thoracic spine: Secondary | ICD-10-CM | POA: Diagnosis not present

## 2016-08-27 DIAGNOSIS — M50322 Other cervical disc degeneration at C5-C6 level: Secondary | ICD-10-CM | POA: Diagnosis not present

## 2016-08-27 DIAGNOSIS — M9902 Segmental and somatic dysfunction of thoracic region: Secondary | ICD-10-CM | POA: Diagnosis not present

## 2016-08-27 DIAGNOSIS — M62830 Muscle spasm of back: Secondary | ICD-10-CM | POA: Diagnosis not present

## 2016-08-28 DIAGNOSIS — M9901 Segmental and somatic dysfunction of cervical region: Secondary | ICD-10-CM | POA: Diagnosis not present

## 2016-08-28 DIAGNOSIS — M546 Pain in thoracic spine: Secondary | ICD-10-CM | POA: Diagnosis not present

## 2016-08-28 DIAGNOSIS — M9902 Segmental and somatic dysfunction of thoracic region: Secondary | ICD-10-CM | POA: Diagnosis not present

## 2016-08-28 DIAGNOSIS — M50322 Other cervical disc degeneration at C5-C6 level: Secondary | ICD-10-CM | POA: Diagnosis not present

## 2016-08-28 DIAGNOSIS — M50323 Other cervical disc degeneration at C6-C7 level: Secondary | ICD-10-CM | POA: Diagnosis not present

## 2016-08-28 DIAGNOSIS — M62830 Muscle spasm of back: Secondary | ICD-10-CM | POA: Diagnosis not present

## 2016-08-31 DIAGNOSIS — M62830 Muscle spasm of back: Secondary | ICD-10-CM | POA: Diagnosis not present

## 2016-08-31 DIAGNOSIS — M9901 Segmental and somatic dysfunction of cervical region: Secondary | ICD-10-CM | POA: Diagnosis not present

## 2016-08-31 DIAGNOSIS — M50322 Other cervical disc degeneration at C5-C6 level: Secondary | ICD-10-CM | POA: Diagnosis not present

## 2016-08-31 DIAGNOSIS — M9902 Segmental and somatic dysfunction of thoracic region: Secondary | ICD-10-CM | POA: Diagnosis not present

## 2016-08-31 DIAGNOSIS — M546 Pain in thoracic spine: Secondary | ICD-10-CM | POA: Diagnosis not present

## 2016-08-31 DIAGNOSIS — M50323 Other cervical disc degeneration at C6-C7 level: Secondary | ICD-10-CM | POA: Diagnosis not present

## 2016-09-03 DIAGNOSIS — M9901 Segmental and somatic dysfunction of cervical region: Secondary | ICD-10-CM | POA: Diagnosis not present

## 2016-09-03 DIAGNOSIS — M62830 Muscle spasm of back: Secondary | ICD-10-CM | POA: Diagnosis not present

## 2016-09-03 DIAGNOSIS — M50322 Other cervical disc degeneration at C5-C6 level: Secondary | ICD-10-CM | POA: Diagnosis not present

## 2016-09-03 DIAGNOSIS — M9902 Segmental and somatic dysfunction of thoracic region: Secondary | ICD-10-CM | POA: Diagnosis not present

## 2016-09-03 DIAGNOSIS — M546 Pain in thoracic spine: Secondary | ICD-10-CM | POA: Diagnosis not present

## 2016-09-03 DIAGNOSIS — M50323 Other cervical disc degeneration at C6-C7 level: Secondary | ICD-10-CM | POA: Diagnosis not present

## 2016-09-05 DIAGNOSIS — M9902 Segmental and somatic dysfunction of thoracic region: Secondary | ICD-10-CM | POA: Diagnosis not present

## 2016-09-05 DIAGNOSIS — M546 Pain in thoracic spine: Secondary | ICD-10-CM | POA: Diagnosis not present

## 2016-09-05 DIAGNOSIS — M62830 Muscle spasm of back: Secondary | ICD-10-CM | POA: Diagnosis not present

## 2016-09-05 DIAGNOSIS — M9901 Segmental and somatic dysfunction of cervical region: Secondary | ICD-10-CM | POA: Diagnosis not present

## 2016-09-05 DIAGNOSIS — M50323 Other cervical disc degeneration at C6-C7 level: Secondary | ICD-10-CM | POA: Diagnosis not present

## 2016-09-05 DIAGNOSIS — M50322 Other cervical disc degeneration at C5-C6 level: Secondary | ICD-10-CM | POA: Diagnosis not present

## 2016-09-07 DIAGNOSIS — M9901 Segmental and somatic dysfunction of cervical region: Secondary | ICD-10-CM | POA: Diagnosis not present

## 2016-09-07 DIAGNOSIS — M546 Pain in thoracic spine: Secondary | ICD-10-CM | POA: Diagnosis not present

## 2016-09-07 DIAGNOSIS — M62830 Muscle spasm of back: Secondary | ICD-10-CM | POA: Diagnosis not present

## 2016-09-07 DIAGNOSIS — M50322 Other cervical disc degeneration at C5-C6 level: Secondary | ICD-10-CM | POA: Diagnosis not present

## 2016-09-07 DIAGNOSIS — M9902 Segmental and somatic dysfunction of thoracic region: Secondary | ICD-10-CM | POA: Diagnosis not present

## 2016-09-07 DIAGNOSIS — M50323 Other cervical disc degeneration at C6-C7 level: Secondary | ICD-10-CM | POA: Diagnosis not present

## 2016-09-10 DIAGNOSIS — M9901 Segmental and somatic dysfunction of cervical region: Secondary | ICD-10-CM | POA: Diagnosis not present

## 2016-09-10 DIAGNOSIS — M9902 Segmental and somatic dysfunction of thoracic region: Secondary | ICD-10-CM | POA: Diagnosis not present

## 2016-09-10 DIAGNOSIS — M50322 Other cervical disc degeneration at C5-C6 level: Secondary | ICD-10-CM | POA: Diagnosis not present

## 2016-09-10 DIAGNOSIS — M50323 Other cervical disc degeneration at C6-C7 level: Secondary | ICD-10-CM | POA: Diagnosis not present

## 2016-09-10 DIAGNOSIS — M62830 Muscle spasm of back: Secondary | ICD-10-CM | POA: Diagnosis not present

## 2016-09-10 DIAGNOSIS — M546 Pain in thoracic spine: Secondary | ICD-10-CM | POA: Diagnosis not present

## 2016-09-11 DIAGNOSIS — Z1231 Encounter for screening mammogram for malignant neoplasm of breast: Secondary | ICD-10-CM | POA: Diagnosis not present

## 2016-09-11 LAB — HM MAMMOGRAPHY

## 2016-09-12 DIAGNOSIS — M50323 Other cervical disc degeneration at C6-C7 level: Secondary | ICD-10-CM | POA: Diagnosis not present

## 2016-09-12 DIAGNOSIS — M50322 Other cervical disc degeneration at C5-C6 level: Secondary | ICD-10-CM | POA: Diagnosis not present

## 2016-09-12 DIAGNOSIS — M62830 Muscle spasm of back: Secondary | ICD-10-CM | POA: Diagnosis not present

## 2016-09-12 DIAGNOSIS — M9901 Segmental and somatic dysfunction of cervical region: Secondary | ICD-10-CM | POA: Diagnosis not present

## 2016-09-12 DIAGNOSIS — M9902 Segmental and somatic dysfunction of thoracic region: Secondary | ICD-10-CM | POA: Diagnosis not present

## 2016-09-12 DIAGNOSIS — M546 Pain in thoracic spine: Secondary | ICD-10-CM | POA: Diagnosis not present

## 2016-09-14 DIAGNOSIS — M50322 Other cervical disc degeneration at C5-C6 level: Secondary | ICD-10-CM | POA: Diagnosis not present

## 2016-09-14 DIAGNOSIS — M9902 Segmental and somatic dysfunction of thoracic region: Secondary | ICD-10-CM | POA: Diagnosis not present

## 2016-09-14 DIAGNOSIS — M546 Pain in thoracic spine: Secondary | ICD-10-CM | POA: Diagnosis not present

## 2016-09-14 DIAGNOSIS — M9901 Segmental and somatic dysfunction of cervical region: Secondary | ICD-10-CM | POA: Diagnosis not present

## 2016-09-14 DIAGNOSIS — M50323 Other cervical disc degeneration at C6-C7 level: Secondary | ICD-10-CM | POA: Diagnosis not present

## 2016-09-14 DIAGNOSIS — M62830 Muscle spasm of back: Secondary | ICD-10-CM | POA: Diagnosis not present

## 2016-09-17 DIAGNOSIS — M9902 Segmental and somatic dysfunction of thoracic region: Secondary | ICD-10-CM | POA: Diagnosis not present

## 2016-09-17 DIAGNOSIS — M546 Pain in thoracic spine: Secondary | ICD-10-CM | POA: Diagnosis not present

## 2016-09-17 DIAGNOSIS — M50323 Other cervical disc degeneration at C6-C7 level: Secondary | ICD-10-CM | POA: Diagnosis not present

## 2016-09-17 DIAGNOSIS — M50322 Other cervical disc degeneration at C5-C6 level: Secondary | ICD-10-CM | POA: Diagnosis not present

## 2016-09-17 DIAGNOSIS — M62830 Muscle spasm of back: Secondary | ICD-10-CM | POA: Diagnosis not present

## 2016-09-17 DIAGNOSIS — M9901 Segmental and somatic dysfunction of cervical region: Secondary | ICD-10-CM | POA: Diagnosis not present

## 2016-09-19 ENCOUNTER — Other Ambulatory Visit: Payer: Self-pay | Admitting: Internal Medicine

## 2016-09-20 ENCOUNTER — Encounter: Payer: Self-pay | Admitting: Internal Medicine

## 2016-09-21 DIAGNOSIS — M546 Pain in thoracic spine: Secondary | ICD-10-CM | POA: Diagnosis not present

## 2016-09-21 DIAGNOSIS — M9902 Segmental and somatic dysfunction of thoracic region: Secondary | ICD-10-CM | POA: Diagnosis not present

## 2016-09-21 DIAGNOSIS — M50323 Other cervical disc degeneration at C6-C7 level: Secondary | ICD-10-CM | POA: Diagnosis not present

## 2016-09-21 DIAGNOSIS — M9901 Segmental and somatic dysfunction of cervical region: Secondary | ICD-10-CM | POA: Diagnosis not present

## 2016-09-21 DIAGNOSIS — M62830 Muscle spasm of back: Secondary | ICD-10-CM | POA: Diagnosis not present

## 2016-09-21 DIAGNOSIS — M50322 Other cervical disc degeneration at C5-C6 level: Secondary | ICD-10-CM | POA: Diagnosis not present

## 2016-09-24 DIAGNOSIS — M9901 Segmental and somatic dysfunction of cervical region: Secondary | ICD-10-CM | POA: Diagnosis not present

## 2016-09-24 DIAGNOSIS — M50323 Other cervical disc degeneration at C6-C7 level: Secondary | ICD-10-CM | POA: Diagnosis not present

## 2016-09-24 DIAGNOSIS — M50322 Other cervical disc degeneration at C5-C6 level: Secondary | ICD-10-CM | POA: Diagnosis not present

## 2016-09-24 DIAGNOSIS — M62830 Muscle spasm of back: Secondary | ICD-10-CM | POA: Diagnosis not present

## 2016-09-24 DIAGNOSIS — M9902 Segmental and somatic dysfunction of thoracic region: Secondary | ICD-10-CM | POA: Diagnosis not present

## 2016-09-24 DIAGNOSIS — M546 Pain in thoracic spine: Secondary | ICD-10-CM | POA: Diagnosis not present

## 2016-09-25 ENCOUNTER — Other Ambulatory Visit (INDEPENDENT_AMBULATORY_CARE_PROVIDER_SITE_OTHER): Payer: Medicare Other

## 2016-09-25 ENCOUNTER — Encounter: Payer: Self-pay | Admitting: Internal Medicine

## 2016-09-25 ENCOUNTER — Ambulatory Visit (INDEPENDENT_AMBULATORY_CARE_PROVIDER_SITE_OTHER): Payer: Medicare Other | Admitting: Internal Medicine

## 2016-09-25 VITALS — BP 144/74 | HR 74 | Temp 97.9°F | Resp 16 | Wt 184.0 lb

## 2016-09-25 DIAGNOSIS — R159 Full incontinence of feces: Secondary | ICD-10-CM

## 2016-09-25 DIAGNOSIS — IMO0002 Reserved for concepts with insufficient information to code with codable children: Secondary | ICD-10-CM

## 2016-09-25 DIAGNOSIS — I6350 Cerebral infarction due to unspecified occlusion or stenosis of unspecified cerebral artery: Secondary | ICD-10-CM

## 2016-09-25 DIAGNOSIS — E1365 Other specified diabetes mellitus with hyperglycemia: Secondary | ICD-10-CM

## 2016-09-25 DIAGNOSIS — I10 Essential (primary) hypertension: Secondary | ICD-10-CM

## 2016-09-25 DIAGNOSIS — I6523 Occlusion and stenosis of bilateral carotid arteries: Secondary | ICD-10-CM

## 2016-09-25 DIAGNOSIS — E1329 Other specified diabetes mellitus with other diabetic kidney complication: Secondary | ICD-10-CM | POA: Diagnosis not present

## 2016-09-25 DIAGNOSIS — R197 Diarrhea, unspecified: Secondary | ICD-10-CM | POA: Diagnosis not present

## 2016-09-25 DIAGNOSIS — F411 Generalized anxiety disorder: Secondary | ICD-10-CM | POA: Diagnosis not present

## 2016-09-25 DIAGNOSIS — E782 Mixed hyperlipidemia: Secondary | ICD-10-CM | POA: Diagnosis not present

## 2016-09-25 LAB — COMPREHENSIVE METABOLIC PANEL
ALBUMIN: 4.7 g/dL (ref 3.5–5.2)
ALK PHOS: 30 U/L — AB (ref 39–117)
ALT: 13 U/L (ref 0–35)
AST: 16 U/L (ref 0–37)
BUN: 20 mg/dL (ref 6–23)
CO2: 31 mEq/L (ref 19–32)
CREATININE: 0.7 mg/dL (ref 0.40–1.20)
Calcium: 9.8 mg/dL (ref 8.4–10.5)
Chloride: 105 mEq/L (ref 96–112)
GFR: 85.99 mL/min (ref >60.00)
GLUCOSE: 131 mg/dL — AB (ref 70–99)
Potassium: 3.9 mEq/L (ref 3.5–5.1)
SODIUM: 141 meq/L (ref 135–145)
TOTAL PROTEIN: 7.2 g/dL (ref 6.0–8.3)
Total Bilirubin: 0.6 mg/dL (ref 0.2–1.2)

## 2016-09-25 LAB — CBC WITH DIFFERENTIAL/PLATELET
BASOS ABS: 0.1 10*3/uL (ref 0.0–0.1)
Basophils Relative: 0.6 % (ref 0.0–3.0)
Eosinophils Absolute: 0.2 10*3/uL (ref 0.0–0.7)
Eosinophils Relative: 2.2 % (ref 0.0–5.0)
HCT: 40.5 % (ref 36.0–46.0)
Hemoglobin: 14 g/dL (ref 12.0–15.0)
LYMPHS ABS: 2 10*3/uL (ref 0.7–4.0)
Lymphocytes Relative: 21.6 % (ref 12.0–46.0)
MCHC: 34.7 g/dL (ref 30.0–36.0)
MCV: 88.4 fl (ref 78.0–100.0)
MONO ABS: 0.6 10*3/uL (ref 0.1–1.0)
Monocytes Relative: 7 % (ref 3.0–12.0)
NEUTROS ABS: 6.2 10*3/uL (ref 1.4–7.7)
NEUTROS PCT: 68.6 % (ref 43.0–77.0)
PLATELETS: 244 10*3/uL (ref 150.0–400.0)
RBC: 4.58 Mil/uL (ref 3.87–5.11)
RDW: 13.1 % (ref 11.5–15.5)
WBC: 9.1 10*3/uL (ref 4.0–10.5)

## 2016-09-25 LAB — HEMOGLOBIN A1C: HEMOGLOBIN A1C: 6.4 % (ref 4.6–6.5)

## 2016-09-25 MED ORDER — ESCITALOPRAM OXALATE 10 MG PO TABS: 10.0000 mg | ORAL_TABLET | Freq: Every day | ORAL | 1 refills | Status: DC

## 2016-09-25 MED ORDER — FREESTYLE LANCETS MISC: 2 refills | Status: DC

## 2016-09-25 MED ORDER — CLONAZEPAM 0.5 MG PO TABS: ORAL_TABLET | ORAL | 0 refills | Status: DC

## 2016-09-25 MED ORDER — GLUCOSE BLOOD VI STRP: ORAL_STRIP | 2 refills | Status: DC

## 2016-09-25 NOTE — Assessment & Plan Note (Signed)
BP Readings from Last 3 Encounters:  09/25/16 (!) 144/74  06/25/16 132/72  03/28/16 140/72   BP well controlled Current regimen effective and well tolerated Continue current medications at current doses

## 2016-09-25 NOTE — Assessment & Plan Note (Signed)
Sugars controlled at home Check a1c Continue regular exercise, healthy diet

## 2016-09-25 NOTE — Assessment & Plan Note (Signed)
Referred to GI

## 2016-09-25 NOTE — Assessment & Plan Note (Signed)
Recheck carotid artery [place] [facility] had referred to vascular but she never knew she was referred - did not see them - may need referral - will see what US shows Continue statin, asa

## 2016-09-25 NOTE — Assessment & Plan Note (Signed)
Continue statin. 

## 2016-09-25 NOTE — Progress Notes (Signed)
Pre visit review using our clinic review tool, if applicable. No additional management support is needed unless otherwise documented below in the visit note. 

## 2016-09-25 NOTE — Assessment & Plan Note (Signed)
Not ideally controlled - boyfriend has metastatic cancer and dementia and sisters have dementia D/c celexa, start lexapro Continue clonazepam at night

## 2016-09-25 NOTE — Patient Instructions (Signed)
  Test(s) ordered today. Your results will be released to [place] (or called to you) after review, usually within 72hours after test completion. If any changes need to be made, you will be notified at that same time.   Medications reviewed and updated.  Changes include stopping cleexa and starting lexapro 10 mg daily.  Your prescription(s) have been submitted to your pharmacy. Please take as directed and contact our office if you believe you are having problem(s) with the medication(s).    Please followup in 6 months

## 2016-09-25 NOTE — Progress Notes (Signed)
Subjective:    Patient ID: [name], female    DOB: 1941/07/13, 76 y.o.   MRN: [id number]  HPI She is here for follow up.  Hypertension: She is taking her medication daily. She is compliant with a low sodium diet.  She denies chest pain, palpitations, edema, shortness of breath and lightheadedness. She is exercising regularly.     Anxiety: She is taking her medication daily as prescribed. She denies any side effects from the medication. She feels her anxiety is well controlled and she is happy with her current dose of medication.   Diabetes: She is taking her medication daily as prescribed. She is compliant with a diabetic diet. She is exercising regularly. She monitors her sugars and they have been running 115-160's.    Fecal incontinence:  She has intermittent fecal incontinence. She does not even know it occurs. She has diarrhea a lot.   She thinks there was some stool that came out of her vagina.  There was some blood from her vagina.  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   History of CVA, carotid artery stenosis:  She was following with Dr Jannifer Franklin - she has not seen him in a while and is not sure why.  Her last carotid US was over one year ago.    Medications and allergies reviewed with patient and updated if appropriate.  Patient Active Problem List   Diagnosis Date Noted  . Generalized anxiety disorder 06/20/2015  . SUI (stress urinary incontinence, female) 02/05/2014  . 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 C V A/STROKE 05/31/2010  . DM (diabetes mellitus), secondary, uncontrolled, with renal complications ([place]) [id number]  . DYSURIA 01/24/2010  . THYROID NODULE, RIGHT 01/21/2009  . FATIGUE 07/12/2008  . Vitamin D deficiency 12/10/2007  . HYPERLIPIDEMIA 06/17/2007  . Essential hypertension 06/17/2007  . Osteopenia 06/17/2007  . Osteoarthrosis, unspecified whether generalized or localized, unspecified site 11/11/2006    Current Outpatient  Prescriptions on File Prior to Visit  Medication Sig Dispense Refill  . aspirin 81 MG tablet Take 81 mg by mouth at bedtime.     . benazepril (LOTENSIN) 20 MG tablet TAKE 1 TABLET DAILY 90 tablet 0  . Blood Glucose Monitoring Suppl (FREESTYLE LITE) DEVI Use meter daily to check blood sugar as instructed. 1 each 3  . calcium citrate-vitamin D 500-400 MG-UNIT chewable tablet Chew 1 tablet by mouth at bedtime.     . Cholecalciferol (VITAMIN D3) 1000 UNITS CAPS Take 1 capsule by mouth daily.    . citalopram (CELEXA) 20 MG tablet TAKE 1 TABLET DAILY 90 tablet 1  . clonazePAM (KLONOPIN) 0.5 MG tablet TAKE ONE TABLET BY MOUTH ONCE DAILY AS NEEDED FOR ANXIETY 90 tablet 0  . CRANBERRY PO Take 300 mg by mouth daily.    . cycloSPORINE (RESTASIS) 0.05 % ophthalmic emulsion Place 1 drop into both eyes 2 (two) times daily.    Marland Kitchen FREESTYLE LITE test strip USE TO CHECK BLOOD SUGAR DAILY AS DIRECTED 100 each 2  . glimepiride (AMARYL) 2 MG tablet Take 1 tablet (2 mg total) by mouth 2 (two) times daily. 180 tablet 1  . Lancets (FREESTYLE) lancets Use as instructed 100 each 12  . metFORMIN (GLUCOPHAGE) 1000 MG tablet TAKE 1 TABLET WITH BREAKFAST, ONE-HALF (1/2) TABLET WITH LUNCH AND 1 TABLET WITH DINNER 225 tablet 1  . methylcellulose (ARTIFICIAL TEARS) 1 % ophthalmic solution Place 1 drop into both eyes daily as needed (itching eyes).     Marland Kitchen  Multiple Vitamins-Calcium (ONE-A-DAY WOMENS FORMULA) TABS Take 1 tablet by mouth daily.    . phenazopyridine (PYRIDIUM) 100 MG tablet Take 1 tablet (100 mg total) by mouth 3 (three) times daily as needed for pain (with food). 10 tablet 0  . pravastatin (PRAVACHOL) 40 MG tablet TAKE 1 TABLET AT BEDTIME 90 tablet 0  . sitaGLIPtin (JANUVIA) 50 MG tablet Take 1 tablet (50 mg total) by mouth daily. 90 tablet 1   No current facility-administered medications on file prior to visit.     Past Medical History:  Diagnosis Date  . Anxiety   . Arthritis HANDS, NECK , BACK  .  Asymptomatic carotid artery stenosis LEFT --  MOD. PER DR WILLIS NOTE OCT 2012  . Benign heart murmur   . Depression    PMH of  . Diabetes mellitus ORAL MED  . DVT (deep venous thrombosis) ([place]) 2006   after arthroscopy  . Frequency of urination   . History of CVA (cerebrovascular accident) NEUROLOGIST  DR WILLIS----  06-01-2010-  LEFT BASAL GANGLIA HEMORRAGE   RESIDUAL RIGHT HAND NUMBNESS/TINGLING  . Hyperlipidemia   . Hypertension   . MRSA (methicillin resistant staph aureus) culture positive    X3; last post TKR  . Nocturia   . Numbness and tingling in right hand RESIDUAL FROM CVA NOV 2011  . Obesity    Redux therapy  . Osteoporosis   . Thyroid disease    right thyroid nodule-- BX DONE 08-15-2011    Past Surgical History:  Procedure Laterality Date  . ABDOMINAL HYSTERECTOMY  1973   Endometriosis & fibroid  . CARPAL TUNNEL RELEASE  2000   RIGHT  . COLONOSCOPY  1999 & 2005   Dr Olevia Perches  . cystoscope     to evaluate recurrent UTIs  . CYSTOSCOPY W/ RETROGRADES  08/22/2011   Procedure: CYSTOSCOPY WITH RETROGRADE PYELOGRAM;  Surgeon: Molli Hazard, MD;  Location: [facility];  Service: Urology;  Laterality: Bilateral;  . CYSTOSCOPY WITH BIOPSY  08/22/2011   Procedure: CYSTOSCOPY WITH BIOPSY;  Surgeon: Molli Hazard, MD;  Location: [facility];  Service: Urology;  Laterality: N/A;  . g2 p2    . JOINT REPLACEMENT  03-28-2006   LEFT THUMB  . KNEE ARTHROSCOPY     BILATERAL PRIOR TO TOTAL KNEE  . LEFT SHOULDER SURG  1990'S  . LUMBAR LAMINECTOMY  08-26-2009   L 4 - 5  . THYROID NODULE BX  08-15-2011   RIGHT  . TOTAL KNEE ARTHROPLASTY  05-26-2002      LEFT   AND RIGHT  03-30-2098  . WRIST SURGERY  2014   tendon placed in joint; Dr Burney Gauze    Social History   Social History  . Marital status: Widowed    Spouse name: N/A  . Number of children: N/A  . Years of education: N/A   Social History Main Topics  . Smoking status:  Never Smoker  . Smokeless tobacco: Never Used  . Alcohol use Yes     Comment:  very rarely , < 1 glass wine / month  . Drug use: No  . Sexual activity: Not Asked   Other Topics Concern  . None   Social History Narrative  . None    Family History  Problem Relation Age of Onset  . Bladder Cancer Mother   . Diabetes Mother   . Brain cancer Sister   . Prostate cancer Brother   . Alzheimer's disease Brother   .  Diabetes Maternal Aunt   . Diabetes Maternal Uncle   . Diabetes Maternal Grandmother     Type 2  . Diabetes Maternal Grandfather     Type 2  . Stroke Paternal Grandfather     in 5s  . Diabetes Sister 96    TYPE 1   . Pancreatic cancer Sister   . Kidney failure Sister     in context of DM & influenza  . Heart disease Neg Hx     Review of Systems  Constitutional: Negative for chills and fever.  Respiratory: Negative for cough, shortness of breath and wheezing.   Cardiovascular: Negative for chest pain, palpitations and leg swelling.  Neurological: Positive for headaches. Negative for light-headedness.  Psychiatric/Behavioral: The patient is nervous/anxious.        Objective:   Vitals:   09/25/16 1117  BP: (!) 144/74  Pulse: 74  Resp: 16  Temp: 97.9 F (36.6 C)   Filed Weights   09/25/16 1117  Weight: 184 lb (83.5 kg)   Body mass index is 29.7 kg/m.  Wt Readings from Last 3 Encounters:  09/25/16 184 lb (83.5 kg)  06/25/16 183 lb (83 kg)  03/28/16 171 lb (77.6 kg)     Physical Exam Constitutional: Appears well-developed and well-nourished. No distress.  HENT:  Head: Normocephalic and atraumatic.  Neck: Neck supple. No tracheal deviation present. No thyromegaly present.  No cervical lymphadenopathy Cardiovascular: Normal rate, regular rhythm and normal heart sounds.   No murmur heard. No carotid bruit .  No edema Pulmonary/Chest: Effort normal and breath sounds normal. No respiratory distress. No has no wheezes. No rales.  Skin: Skin is warm  and dry. Not diaphoretic.  Psychiatric: Normal mood and affect. Behavior is normal.         Assessment & Plan:   See Problem List for Assessment and Plan of chronic medical problems.

## 2016-09-25 NOTE — Assessment & Plan Note (Signed)
Has not seen Dr Jannifer Franklin in a while Continue asa 81 mg daily Recheck carotid arteries

## 2016-09-25 NOTE — Assessment & Plan Note (Signed)
?   Cause - concern for possible recto-vaginal fistual Chronic diarrhea Will refer to GI Has gyn appt

## 2016-09-27 ENCOUNTER — Encounter: Payer: Self-pay | Admitting: Gastroenterology

## 2016-09-28 DIAGNOSIS — M9902 Segmental and somatic dysfunction of thoracic region: Secondary | ICD-10-CM | POA: Diagnosis not present

## 2016-09-28 DIAGNOSIS — M9901 Segmental and somatic dysfunction of cervical region: Secondary | ICD-10-CM | POA: Diagnosis not present

## 2016-09-28 DIAGNOSIS — M546 Pain in thoracic spine: Secondary | ICD-10-CM | POA: Diagnosis not present

## 2016-09-28 DIAGNOSIS — M50322 Other cervical disc degeneration at C5-C6 level: Secondary | ICD-10-CM | POA: Diagnosis not present

## 2016-09-28 DIAGNOSIS — M50323 Other cervical disc degeneration at C6-C7 level: Secondary | ICD-10-CM | POA: Diagnosis not present

## 2016-09-28 DIAGNOSIS — M62830 Muscle spasm of back: Secondary | ICD-10-CM | POA: Diagnosis not present

## 2016-10-01 DIAGNOSIS — M62830 Muscle spasm of back: Secondary | ICD-10-CM | POA: Diagnosis not present

## 2016-10-01 DIAGNOSIS — M50323 Other cervical disc degeneration at C6-C7 level: Secondary | ICD-10-CM | POA: Diagnosis not present

## 2016-10-01 DIAGNOSIS — M546 Pain in thoracic spine: Secondary | ICD-10-CM | POA: Diagnosis not present

## 2016-10-01 DIAGNOSIS — M9902 Segmental and somatic dysfunction of thoracic region: Secondary | ICD-10-CM | POA: Diagnosis not present

## 2016-10-01 DIAGNOSIS — M50322 Other cervical disc degeneration at C5-C6 level: Secondary | ICD-10-CM | POA: Diagnosis not present

## 2016-10-01 DIAGNOSIS — M9901 Segmental and somatic dysfunction of cervical region: Secondary | ICD-10-CM | POA: Diagnosis not present

## 2016-10-03 ENCOUNTER — Other Ambulatory Visit: Payer: Self-pay | Admitting: Internal Medicine

## 2016-10-05 DIAGNOSIS — M9901 Segmental and somatic dysfunction of cervical region: Secondary | ICD-10-CM | POA: Diagnosis not present

## 2016-10-05 DIAGNOSIS — M50322 Other cervical disc degeneration at C5-C6 level: Secondary | ICD-10-CM | POA: Diagnosis not present

## 2016-10-05 DIAGNOSIS — M546 Pain in thoracic spine: Secondary | ICD-10-CM | POA: Diagnosis not present

## 2016-10-05 DIAGNOSIS — M9902 Segmental and somatic dysfunction of thoracic region: Secondary | ICD-10-CM | POA: Diagnosis not present

## 2016-10-05 DIAGNOSIS — M50323 Other cervical disc degeneration at C6-C7 level: Secondary | ICD-10-CM | POA: Diagnosis not present

## 2016-10-05 DIAGNOSIS — M62830 Muscle spasm of back: Secondary | ICD-10-CM | POA: Diagnosis not present

## 2016-10-08 DIAGNOSIS — M50322 Other cervical disc degeneration at C5-C6 level: Secondary | ICD-10-CM | POA: Diagnosis not present

## 2016-10-08 DIAGNOSIS — M9902 Segmental and somatic dysfunction of thoracic region: Secondary | ICD-10-CM | POA: Diagnosis not present

## 2016-10-08 DIAGNOSIS — M9901 Segmental and somatic dysfunction of cervical region: Secondary | ICD-10-CM | POA: Diagnosis not present

## 2016-10-08 DIAGNOSIS — M62830 Muscle spasm of back: Secondary | ICD-10-CM | POA: Diagnosis not present

## 2016-10-08 DIAGNOSIS — M546 Pain in thoracic spine: Secondary | ICD-10-CM | POA: Diagnosis not present

## 2016-10-08 DIAGNOSIS — M50323 Other cervical disc degeneration at C6-C7 level: Secondary | ICD-10-CM | POA: Diagnosis not present

## 2016-10-10 ENCOUNTER — Ambulatory Visit (HOSPITAL_COMMUNITY)
Admission: RE | Admit: 2016-10-10 | Discharge: 2016-10-10 | Disposition: A | Discharge status: Home / Self Care | Payer: Medicare Other | Source: Ambulatory Visit | Attending: Cardiovascular Disease | Admitting: Cardiovascular Disease

## 2016-10-10 DIAGNOSIS — I6523 Occlusion and stenosis of bilateral carotid arteries: Secondary | ICD-10-CM | POA: Diagnosis not present

## 2016-10-12 DIAGNOSIS — M62830 Muscle spasm of back: Secondary | ICD-10-CM | POA: Diagnosis not present

## 2016-10-12 DIAGNOSIS — M9902 Segmental and somatic dysfunction of thoracic region: Secondary | ICD-10-CM | POA: Diagnosis not present

## 2016-10-12 DIAGNOSIS — M50323 Other cervical disc degeneration at C6-C7 level: Secondary | ICD-10-CM | POA: Diagnosis not present

## 2016-10-12 DIAGNOSIS — M9901 Segmental and somatic dysfunction of cervical region: Secondary | ICD-10-CM | POA: Diagnosis not present

## 2016-10-12 DIAGNOSIS — M50322 Other cervical disc degeneration at C5-C6 level: Secondary | ICD-10-CM | POA: Diagnosis not present

## 2016-10-12 DIAGNOSIS — M546 Pain in thoracic spine: Secondary | ICD-10-CM | POA: Diagnosis not present

## 2016-10-15 DIAGNOSIS — M9902 Segmental and somatic dysfunction of thoracic region: Secondary | ICD-10-CM | POA: Diagnosis not present

## 2016-10-15 DIAGNOSIS — M50323 Other cervical disc degeneration at C6-C7 level: Secondary | ICD-10-CM | POA: Diagnosis not present

## 2016-10-15 DIAGNOSIS — M546 Pain in thoracic spine: Secondary | ICD-10-CM | POA: Diagnosis not present

## 2016-10-15 DIAGNOSIS — M62830 Muscle spasm of back: Secondary | ICD-10-CM | POA: Diagnosis not present

## 2016-10-15 DIAGNOSIS — M50322 Other cervical disc degeneration at C5-C6 level: Secondary | ICD-10-CM | POA: Diagnosis not present

## 2016-10-15 DIAGNOSIS — M9901 Segmental and somatic dysfunction of cervical region: Secondary | ICD-10-CM | POA: Diagnosis not present

## 2016-10-18 DIAGNOSIS — M9902 Segmental and somatic dysfunction of thoracic region: Secondary | ICD-10-CM | POA: Diagnosis not present

## 2016-10-18 DIAGNOSIS — M9901 Segmental and somatic dysfunction of cervical region: Secondary | ICD-10-CM | POA: Diagnosis not present

## 2016-10-18 DIAGNOSIS — M50323 Other cervical disc degeneration at C6-C7 level: Secondary | ICD-10-CM | POA: Diagnosis not present

## 2016-10-18 DIAGNOSIS — M546 Pain in thoracic spine: Secondary | ICD-10-CM | POA: Diagnosis not present

## 2016-10-18 DIAGNOSIS — M62830 Muscle spasm of back: Secondary | ICD-10-CM | POA: Diagnosis not present

## 2016-10-18 DIAGNOSIS — M50322 Other cervical disc degeneration at C5-C6 level: Secondary | ICD-10-CM | POA: Diagnosis not present

## 2016-10-31 ENCOUNTER — Telehealth: Payer: Self-pay | Admitting: Internal Medicine

## 2016-10-31 ENCOUNTER — Encounter (INDEPENDENT_AMBULATORY_CARE_PROVIDER_SITE_OTHER): Payer: Self-pay

## 2016-10-31 ENCOUNTER — Other Ambulatory Visit (INDEPENDENT_AMBULATORY_CARE_PROVIDER_SITE_OTHER): Payer: Medicare Other

## 2016-10-31 ENCOUNTER — Ambulatory Visit (INDEPENDENT_AMBULATORY_CARE_PROVIDER_SITE_OTHER): Payer: Medicare Other | Admitting: Gastroenterology

## 2016-10-31 ENCOUNTER — Encounter: Payer: Self-pay | Admitting: Gastroenterology

## 2016-10-31 VITALS — BP 124/68 | HR 80 | Ht 66.0 in | Wt 185.0 lb

## 2016-10-31 DIAGNOSIS — R300 Dysuria: Secondary | ICD-10-CM

## 2016-10-31 DIAGNOSIS — I6350 Cerebral infarction due to unspecified occlusion or stenosis of unspecified cerebral artery: Secondary | ICD-10-CM | POA: Diagnosis not present

## 2016-10-31 DIAGNOSIS — R197 Diarrhea, unspecified: Secondary | ICD-10-CM

## 2016-10-31 DIAGNOSIS — R8290 Unspecified abnormal findings in urine: Secondary | ICD-10-CM

## 2016-10-31 DIAGNOSIS — R1031 Right lower quadrant pain: Secondary | ICD-10-CM | POA: Diagnosis not present

## 2016-10-31 DIAGNOSIS — R159 Full incontinence of feces: Secondary | ICD-10-CM | POA: Diagnosis not present

## 2016-10-31 LAB — URINALYSIS, ROUTINE W REFLEX MICROSCOPIC
BILIRUBIN URINE: NEGATIVE
KETONES UR: NEGATIVE
NITRITE: POSITIVE — AB
PH: 6 (ref 5.0–8.0)
SPECIFIC GRAVITY, URINE: 1.025 (ref 1.000–1.030)
Urine Glucose: NEGATIVE
Urobilinogen, UA: 0.2 (ref 0.0–1.0)

## 2016-10-31 MED ORDER — NA SULFATE-K SULFATE-MG SULF 17.5-3.13-1.6 GM/177ML PO SOLN: 1.0000 | Freq: Once | ORAL | 0 refills | Status: AC

## 2016-10-31 MED ORDER — NA SULFATE-K SULFATE-MG SULF 17.5-3.13-1.6 GM/177ML PO SOLN: 1.0000 | Freq: Once | ORAL | 0 refills | Status: DC

## 2016-10-31 NOTE — Telephone Encounter (Signed)
Orders entered per MDs approval.

## 2016-10-31 NOTE — Telephone Encounter (Signed)
Pt is here and thinks that she has a bladder infection. She said that she has these frequently. Can we put an order in for her to check to see if she does or not? Please advise. Pt is waiting in the lobby.

## 2016-10-31 NOTE — Progress Notes (Signed)
HPI :  76 y/o female with history of prior DVT and CVA, DM, here for new patient visit for symptoms of diarrhea, loose stools.   Patient states symptoms for 2-3 months. She reports loose stools every time when she urinates, as well as after eating has a strong urge to have a bowel movement. She has had some nocturnal bowel movements with incontinence during this course. For the past week she has felt somewhat better, but prior to this loose and watery stools. She has been having around 5 BMs per day. She had blood in the stool once but normally does not have this.  She has had a prior surgery from a "fistula" between her bladder and small bowel a few years ago, she is not sure why this happened, and has had a history of UTIs. She's also had a prior bowel resection as part of this surgery in 2014. She has some periodic pains in the RLQ. She denies any history of being told she had Crohn's disease.   No FH of Crohns or colitis, no colon cancer.   She reports last colonoscopy was in 2014 she states it was incomplete due to tortousity. Exam aborted in sigmoid colon.  Patient has been on Januvia within the past few months, she has been on metformin long standing. She takes rare NSAIDs. She has tried some immodium which has helped at times. No recent antibiotic use. No exposure to C diff known.   Colonoscopy 12/30/2012 - normal, no biopsies obtained, aborted in sigmoid colon.  Past Medical History:  Diagnosis Date  . Anxiety   . Arthritis HANDS, NECK , BACK  . Asymptomatic carotid artery stenosis LEFT --  MOD. PER DR WILLIS NOTE OCT 2012  . Benign heart murmur   . Depression    PMH of  . Diabetes mellitus ORAL MED  . DVT (deep venous thrombosis) ([place]) 2006   after arthroscopy  . Frequency of urination   . History of CVA (cerebrovascular accident) NEUROLOGIST  DR WILLIS----  06-01-2010-  LEFT BASAL GANGLIA HEMORRAGE   RESIDUAL RIGHT HAND NUMBNESS/TINGLING  . Hyperlipidemia   . Hypertension     . MRSA (methicillin resistant staph aureus) culture positive    X3; last post TKR  . Nocturia   . Numbness and tingling in right hand RESIDUAL FROM CVA NOV 2011  . Obesity    Redux therapy  . Osteoporosis   . Thyroid disease    right thyroid nodule-- BX DONE 08-15-2011     Past Surgical History:  Procedure Laterality Date  . ABDOMINAL HYSTERECTOMY  1973   Endometriosis & fibroid  . CARPAL TUNNEL RELEASE  2000   RIGHT  . COLONOSCOPY  1999 & 2005   Dr Olevia Perches  . cystoscope     to evaluate recurrent UTIs  . CYSTOSCOPY W/ RETROGRADES  08/22/2011   Procedure: CYSTOSCOPY WITH RETROGRADE PYELOGRAM;  Surgeon: Molli Hazard, MD;  Location: [facility];  Service: Urology;  Laterality: Bilateral;  . CYSTOSCOPY WITH BIOPSY  08/22/2011   Procedure: CYSTOSCOPY WITH BIOPSY;  Surgeon: Molli Hazard, MD;  Location: [facility];  Service: Urology;  Laterality: N/A;  . g2 p2    . JOINT REPLACEMENT  03-28-2006   LEFT THUMB  . KNEE ARTHROSCOPY     BILATERAL PRIOR TO TOTAL KNEE  . LEFT SHOULDER SURG  1990'S  . LUMBAR LAMINECTOMY  08-26-2009   L 4 - 5  . THYROID NODULE BX  08-15-2011  RIGHT  . TOTAL KNEE ARTHROPLASTY  05-26-2002      LEFT   AND RIGHT  03-30-2098  . WRIST SURGERY  2014   tendon placed in joint; Dr Burney Gauze   Family History  Problem Relation Age of Onset  . Bladder Cancer Mother   . Diabetes Mother   . Brain cancer Sister   . Stroke Paternal Grandfather     in 43s  . Diabetes Sister 65    TYPE 1   . Prostate cancer Brother   . Alzheimer's disease Brother   . Diabetes Maternal Aunt   . Diabetes Maternal Uncle   . Diabetes Maternal Grandmother     Type 2  . Diabetes Maternal Grandfather     Type 2  . Pancreatic cancer Sister   . Kidney failure Sister     in context of DM & influenza  . Heart disease Neg Hx    Social History  Substance Use Topics  . Smoking status: Never Smoker  . Smokeless tobacco: Never Used  .  Alcohol use Yes     Comment:  very rarely , < 1 glass wine / month   Current Outpatient Prescriptions  Medication Sig Dispense Refill  . aspirin 81 MG tablet Take 81 mg by mouth at bedtime.     . benazepril (LOTENSIN) 20 MG tablet TAKE 1 TABLET DAILY 90 tablet 0  . Blood Glucose Monitoring Suppl (FREESTYLE LITE) DEVI Use meter daily to check blood sugar as instructed. 1 each 3  . calcium citrate-vitamin D 500-400 MG-UNIT chewable tablet Chew 1 tablet by mouth at bedtime.     . Cholecalciferol (VITAMIN D3) 1000 UNITS CAPS Take 1 capsule by mouth daily.    . clonazePAM (KLONOPIN) 0.5 MG tablet TAKE ONE TABLET BY MOUTH ONCE DAILY AS NEEDED FOR ANXIETY 90 tablet 0  . CRANBERRY PO Take 300 mg by mouth daily.    . cycloSPORINE (RESTASIS) 0.05 % ophthalmic emulsion Place 1 drop into both eyes 2 (two) times daily.    Marland Kitchen escitalopram (LEXAPRO) 10 MG tablet Take 1 tablet (10 mg total) by mouth daily. 90 tablet 1  . glimepiride (AMARYL) 2 MG tablet Take 1 tablet (2 mg total) by mouth 2 (two) times daily. 180 tablet 1  . glucose blood (FREESTYLE LITE) test strip USE TO CHECK BLOOD SUGAR DAILY AS DIRECTED 100 each 2  . JANUVIA 50 MG tablet TAKE 1 TABLET DAILY 90 tablet 1  . Lancets (FREESTYLE) lancets Use as instructed 100 each 2  . metFORMIN (GLUCOPHAGE) 1000 MG tablet TAKE 1 TABLET WITH BREAKFAST, ONE-HALF (1/2) TABLET WITH LUNCH AND 1 TABLET WITH DINNER 225 tablet 1  . methylcellulose (ARTIFICIAL TEARS) 1 % ophthalmic solution Place 1 drop into both eyes daily as needed (itching eyes).     . Multiple Vitamins-Calcium (ONE-A-DAY WOMENS FORMULA) TABS Take 1 tablet by mouth daily.    . phenazopyridine (PYRIDIUM) 100 MG tablet Take 1 tablet (100 mg total) by mouth 3 (three) times daily as needed for pain (with food). 10 tablet 0  . pravastatin (PRAVACHOL) 40 MG tablet TAKE 1 TABLET AT BEDTIME 90 tablet 0   No current facility-administered medications for this visit.    Allergies  Allergen Reactions  .  Conjugated Estrogens     REACTION: weight gain  & fibrocystic breast disease changes  . Percocet [Oxycodone-Acetaminophen] Itching  . Detrol [Tolterodine]     Rxed by [facility] Excessive drying     Review of Systems: All systems  reviewed and negative except where noted in HPI.   Lab Results  Component Value Date   WBC 9.1 09/25/2016   HGB 14.0 09/25/2016   HCT 40.5 09/25/2016   MCV 88.4 09/25/2016   PLT 244.0 09/25/2016    Lab Results  Component Value Date   CREATININE 0.70 09/25/2016   BUN 20 09/25/2016   NA 141 09/25/2016   K 3.9 09/25/2016   CL 105 09/25/2016   CO2 31 09/25/2016    Lab Results  Component Value Date   ALT 13 09/25/2016   AST 16 09/25/2016   ALKPHOS 30 (L) 09/25/2016   BILITOT 0.6 09/25/2016     Physical Exam: BP 124/68   Pulse 80   Ht 5\' 6"  (1.676 m)   Wt 185 lb (83.9 kg)   BMI 29.86 kg/m  Constitutional: Pleasant,well-developed, female in no acute distress. HEENT: Normocephalic and atraumatic. Conjunctivae are normal. No scleral icterus. Neck supple.  Cardiovascular: Normal rate, regular rhythm.  Pulmonary/chest: Effort normal and breath sounds normal. No wheezing, rales or rhonchi. Abdominal: Soft, nondistended, nontender. There are no masses palpable. No hepatomegaly. Extremities: no edema Lymphadenopathy: No cervical adenopathy noted. Neurological: Alert and oriented to person place and time. Skin: Skin is warm and dry. No rashes noted. Psychiatric: Normal mood and affect. Behavior is normal.   ASSESSMENT AND PLAN: 76 year old female with a history of enterovesicle fistula s/p surgical repair (no known Crohn's), presenting with upwards of 3 months of severe diarrhea associated with fecal incontinence and nocturnal symptoms. Last colonoscopy attempt in 2014 aborted due to inability to traverse sigmoid colon. I discussed ddx with her. Recommend C Diff and fecal lactoferrin initially. Otherwise, would recommend another attempt at  colonoscopy, and if not successful in reaching the cecum / ileum, at least obtain biopsies to rule out microscopic colitis which is quite possible. She reports no pathologic evidence of Crohn's from her prior fistula surgery, but may consider cross sectional imaging of the abdomen to ensure no evidence of IBD if symptoms of loose stools and her pain persists. If C Diff is negative she can use immodium in the interim to treat her diarrhea. She agreed with the plan. And following discussion of risks / benefits of endoscopy wished to proceed.   Robbins Cellar, MD [facility] Pager [phone]  CC: Binnie Rail, MD

## 2016-10-31 NOTE — Patient Instructions (Addendum)
If you are age 78 or older, your body mass index should be between 23-30. Your Body mass index is 29.86 kg/m. If this is out of the aforementioned range listed, please consider follow up with your Primary Care Provider.  If you are age 6 or younger, your body mass index should be between 19-25. Your Body mass index is 29.86 kg/m. If this is out of the aformentioned range listed, please consider follow up with your Primary Care Provider.   We have sent the following medications to your pharmacy for you to pick up at your convenience:  [facility] has requested that you go to the basement for the following lab work before leaving today:  Stool C. Diff, Stool Lactoferrin  You have been scheduled for a colonoscopy. Please follow written instructions given to you at your visit today.  Please pick up your prep supplies at the pharmacy within the next 1-3 days. If you use inhalers (even only as needed), please bring them with you on the day of your procedure. Your physician has requested that you go to www.startemmi.com and enter the access code given to you at your visit today. This web site gives a general overview about your procedure. However, you should still follow specific instructions given to you by our office regarding your preparation for the procedure.  Thank you.

## 2016-11-01 ENCOUNTER — Other Ambulatory Visit: Payer: Medicare Other

## 2016-11-01 DIAGNOSIS — R197 Diarrhea, unspecified: Secondary | ICD-10-CM

## 2016-11-01 DIAGNOSIS — R159 Full incontinence of feces: Secondary | ICD-10-CM

## 2016-11-01 DIAGNOSIS — E041 Nontoxic single thyroid nodule: Secondary | ICD-10-CM

## 2016-11-02 ENCOUNTER — Other Ambulatory Visit: Payer: Self-pay | Admitting: Internal Medicine

## 2016-11-02 LAB — URINE CULTURE

## 2016-11-02 LAB — FECAL LACTOFERRIN, QUANT: Lactoferrin: NEGATIVE

## 2016-11-02 LAB — C. DIFFICILE GDH AND TOXIN A/B
C. DIFF TOXIN A/B: NOT DETECTED
C. difficile GDH: NOT DETECTED

## 2016-11-02 MED ORDER — SULFAMETHOXAZOLE-TRIMETHOPRIM 800-160 MG PO TABS: 1.0000 | ORAL_TABLET | Freq: Two times a day (BID) | ORAL | 0 refills | Status: DC

## 2016-11-04 ENCOUNTER — Other Ambulatory Visit: Payer: Self-pay | Admitting: Internal Medicine

## 2016-11-04 MED ORDER — NITROFURANTOIN MONOHYD MACRO 100 MG PO CAPS: 100.0000 mg | ORAL_CAPSULE | Freq: Two times a day (BID) | ORAL | 0 refills | Status: DC

## 2016-11-05 ENCOUNTER — Telehealth: Payer: Self-pay

## 2016-11-05 DIAGNOSIS — M50322 Other cervical disc degeneration at C5-C6 level: Secondary | ICD-10-CM | POA: Diagnosis not present

## 2016-11-05 DIAGNOSIS — M62830 Muscle spasm of back: Secondary | ICD-10-CM | POA: Diagnosis not present

## 2016-11-05 DIAGNOSIS — M9902 Segmental and somatic dysfunction of thoracic region: Secondary | ICD-10-CM | POA: Diagnosis not present

## 2016-11-05 DIAGNOSIS — M50323 Other cervical disc degeneration at C6-C7 level: Secondary | ICD-10-CM | POA: Diagnosis not present

## 2016-11-05 DIAGNOSIS — M546 Pain in thoracic spine: Secondary | ICD-10-CM | POA: Diagnosis not present

## 2016-11-05 DIAGNOSIS — M9901 Segmental and somatic dysfunction of cervical region: Secondary | ICD-10-CM | POA: Diagnosis not present

## 2016-11-05 NOTE — Telephone Encounter (Signed)
-----   Message from Manus Gunning, MD sent at 11/02/2016  7:32 AM EDT ----- Caryl Pina can you please let this patient know her C diff testing is negative. She can use immodium daily and titrate up as needed to control her diarrhea until we perform her colonoscopy. Thanks

## 2016-11-05 NOTE — Telephone Encounter (Signed)
Pt informed of negative results. She states that her diarrhea has went away and that she would hold off on the Immodium for now. Will await the colon.

## 2016-11-07 DIAGNOSIS — Z90712 Acquired absence of cervix with remaining uterus: Secondary | ICD-10-CM | POA: Diagnosis not present

## 2016-11-07 DIAGNOSIS — Z124 Encounter for screening for malignant neoplasm of cervix: Secondary | ICD-10-CM | POA: Diagnosis not present

## 2016-11-07 DIAGNOSIS — Z6830 Body mass index (BMI) 30.0-30.9, adult: Secondary | ICD-10-CM | POA: Diagnosis not present

## 2016-11-07 DIAGNOSIS — Z1272 Encounter for screening for malignant neoplasm of vagina: Secondary | ICD-10-CM | POA: Diagnosis not present

## 2016-11-13 ENCOUNTER — Ambulatory Visit (AMBULATORY_SURGERY_CENTER): Payer: Medicare Other | Admitting: Gastroenterology

## 2016-11-13 ENCOUNTER — Encounter: Payer: Self-pay | Admitting: Gastroenterology

## 2016-11-13 VITALS — BP 135/66 | HR 68 | Temp 97.8°F | Resp 18 | Ht 66.0 in | Wt 185.0 lb

## 2016-11-13 DIAGNOSIS — K635 Polyp of colon: Secondary | ICD-10-CM | POA: Diagnosis not present

## 2016-11-13 DIAGNOSIS — R159 Full incontinence of feces: Secondary | ICD-10-CM | POA: Diagnosis not present

## 2016-11-13 DIAGNOSIS — R197 Diarrhea, unspecified: Secondary | ICD-10-CM | POA: Diagnosis present

## 2016-11-13 DIAGNOSIS — Z8673 Personal history of transient ischemic attack (TIA), and cerebral infarction without residual deficits: Secondary | ICD-10-CM | POA: Diagnosis not present

## 2016-11-13 DIAGNOSIS — I10 Essential (primary) hypertension: Secondary | ICD-10-CM | POA: Diagnosis not present

## 2016-11-13 DIAGNOSIS — E109 Type 1 diabetes mellitus without complications: Secondary | ICD-10-CM | POA: Diagnosis not present

## 2016-11-13 DIAGNOSIS — K5140 Inflammatory polyps of colon without complications: Secondary | ICD-10-CM | POA: Diagnosis not present

## 2016-11-13 DIAGNOSIS — D125 Benign neoplasm of sigmoid colon: Secondary | ICD-10-CM

## 2016-11-13 MED ORDER — SODIUM CHLORIDE 0.9 % IV SOLN: 500.0000 mL | INTRAVENOUS | Status: DC

## 2016-11-13 NOTE — Op Note (Signed)
[name]: [name] Procedure Date: 11/13/2016 9:38 AM MRN: [id number] Endoscopist: Remo Lipps P. Pacer Dorn MD, MD Age: 76 Referring MD:  Date of Birth: [birth year] Gender: Female Account #: [id number] Procedure:                Colonoscopy Indications:              Clinically significant diarrhea of unexplained                            origin. Last colonoscopy in 2014 was incomplete. Medicines:                Monitored Anesthesia Care Procedure:                Pre-Anesthesia Assessment:                           - Prior to the procedure, a History and Physical                            was performed, and patient medications and                            allergies were reviewed. The patient's tolerance of                            previous anesthesia was also reviewed. The risks                            and benefits of the procedure and the sedation                            options and risks were discussed with the patient.                            All questions were answered, and informed consent                            was obtained. Prior Anticoagulants: The patient has                            taken aspirin, last dose was 1 day prior to                            procedure. ASA Grade Assessment: III - A patient                            with severe systemic disease. After reviewing the                            risks and benefits, the patient was deemed in                            satisfactory condition to undergo the procedure.  After obtaining informed consent, the colonoscope                            was passed under direct vision. Throughout the                            procedure, the patient's blood pressure, pulse, and                            oxygen saturations were monitored continuously. The                            Colonoscope was introduced through the anus and                            advanced  to the the terminal ileum, with                            identification of the appendiceal orifice and IC                            valve. The colonoscopy was performed without                            difficulty. The patient tolerated the procedure                            well. The quality of the bowel preparation was                            good. The terminal ileum, ileocecal valve,                            appendiceal orifice, and rectum were photographed. Scope In: 9:43:59 AM Scope Out: 9:59:51 AM Scope Withdrawal Time: 0 hours 13 minutes 43 seconds  Total Procedure Duration: 0 hours 15 minutes 52 seconds  Findings:                 The perianal and digital rectal examinations were                            normal.                           The terminal ileum appeared normal.                           A 5 mm polyp was found in the sigmoid colon. The                            polyp was sessile. The polyp was removed with a                            cold snare. Resection and retrieval were complete.  Anal papilla(e) were hypertrophied.                           There was a prominent vessel / AVM in the sigmoid                            colon with no stigmata of bleeding. The exam was                            otherwise without abnormality. No overt                            inflammatory changes.                           Biopsies for histology were taken with a cold                            forceps from the right colon, left colon and                            transverse colon for evaluation of microscopic                            colitis. Complications:            No immediate complications. Estimated blood loss:                            Minimal. Estimated Blood Loss:     Estimated blood loss was minimal. Impression:               - The examined portion of the ileum was normal.                           - One 5 mm polyp in the sigmoid  colon, removed with                            a cold snare. Resected and retrieved.                           - Anal papilla(e) were hypertrophied.                           - Prominent vessel / AVM in the sigmoid colon                           - The examination was otherwise normal. No                            inflammatory changes.                           - Biopsies were taken with a cold forceps from the  right colon, left colon and transverse colon for                            evaluation of microscopic colitis. Recommendation:           - Patient has a contact number available for                            emergencies. The signs and symptoms of potential                            delayed complications were discussed with the                            patient. Return to normal activities tomorrow.                            Written discharge instructions were provided to the                            patient.                           - Resume previous diet.                           - Continue present medications.                           - Await pathology results.                           - Repeat colonoscopy is recommended for                            surveillance. The colonoscopy date will be                            determined after pathology results from today's                            exam become available for review. Remo Lipps P. Iosefa Weintraub MD, MD 11/13/2016 10:04:30 AM This report has been signed electronically.

## 2016-11-13 NOTE — Patient Instructions (Signed)
Handout given on polyps  YOU HAD AN ENDOSCOPIC PROCEDURE TODAY: Refer to the procedure report and other information in the discharge instructions given to you for any specific questions about what was found during the examination. If this information does not answer your questions, please call [facility] office at [phone] to clarify.   YOU SHOULD EXPECT: Some feelings of bloating in the abdomen. Passage of more gas than usual. Walking can help get rid of the air that was put into your GI tract during the procedure and reduce the bloating. If you had a lower endoscopy (such as a colonoscopy or flexible sigmoidoscopy) you may notice spotting of blood in your stool or on the toilet paper. Some abdominal soreness may be present for a day or two, also.  DIET: Your first meal following the procedure should be a light meal and then it is ok to progress to your normal diet. A half-sandwich or bowl of soup is an example of a good first meal. Heavy or fried foods are harder to digest and may make you feel nauseous or bloated. Drink plenty of fluids but you should avoid alcoholic beverages for 24 hours. If you had a esophageal dilation, please see attached instructions for diet.    ACTIVITY: Your care partner should take you home directly after the procedure. You should plan to take it easy, moving slowly for the rest of the day. You can resume normal activity the day after the procedure however YOU SHOULD NOT DRIVE, use power tools, machinery or perform tasks that involve climbing or major physical exertion for 24 hours (because of the sedation medicines used during the test).   SYMPTOMS TO REPORT IMMEDIATELY: A gastroenterologist can be reached at any hour. Please call [phone]  for any of the following symptoms:  Following lower endoscopy (colonoscopy, flexible sigmoidoscopy) Excessive amounts of blood in the stool  Significant tenderness, worsening of abdominal pains  Swelling of the abdomen that is  new, acute  Fever of 100 or higher    FOLLOW UP:  If any biopsies were taken you will be contacted by phone or by letter within the next 1-3 weeks. Call [phone]  if you have not heard about the biopsies in 3 weeks.  Please also call with any specific questions about appointments or follow up tests.  

## 2016-11-13 NOTE — Progress Notes (Signed)
TO PACU  Pt awake and alert. Report to RN 

## 2016-11-13 NOTE — Progress Notes (Signed)
Called to room to assist during endoscopic procedure.  Patient ID and intended procedure confirmed with present staff. Received instructions for my participation in the procedure from the performing physician.  

## 2016-11-14 ENCOUNTER — Telehealth: Payer: Self-pay

## 2016-11-14 DIAGNOSIS — H35341 Macular cyst, hole, or pseudohole, right eye: Secondary | ICD-10-CM | POA: Diagnosis not present

## 2016-11-14 DIAGNOSIS — H35371 Puckering of macula, right eye: Secondary | ICD-10-CM | POA: Diagnosis not present

## 2016-11-14 DIAGNOSIS — H43811 Vitreous degeneration, right eye: Secondary | ICD-10-CM | POA: Diagnosis not present

## 2016-11-14 NOTE — Telephone Encounter (Signed)
  Follow up Call-  Call back number 11/13/2016  Post procedure Call Back phone  # [phone]  Permission to leave phone message Yes  Some recent data might be hidden     Patient questions:  Do you have a fever, pain , or abdominal swelling? No. Pain Score  0 *  Have you tolerated food without any problems? Yes.    Have you been able to return to your normal activities? Yes.    Do you have any questions about your discharge instructions: Diet   No. Medications  No. Follow up visit  No.  Do you have questions or concerns about your Care? No.  Actions: * If pain score is 4 or above: No action needed, pain <4.

## 2016-11-15 DIAGNOSIS — M50323 Other cervical disc degeneration at C6-C7 level: Secondary | ICD-10-CM | POA: Diagnosis not present

## 2016-11-15 DIAGNOSIS — M9901 Segmental and somatic dysfunction of cervical region: Secondary | ICD-10-CM | POA: Diagnosis not present

## 2016-11-15 DIAGNOSIS — M62830 Muscle spasm of back: Secondary | ICD-10-CM | POA: Diagnosis not present

## 2016-11-15 DIAGNOSIS — M50322 Other cervical disc degeneration at C5-C6 level: Secondary | ICD-10-CM | POA: Diagnosis not present

## 2016-11-15 DIAGNOSIS — M546 Pain in thoracic spine: Secondary | ICD-10-CM | POA: Diagnosis not present

## 2016-11-15 DIAGNOSIS — M9902 Segmental and somatic dysfunction of thoracic region: Secondary | ICD-10-CM | POA: Diagnosis not present

## 2016-11-26 DIAGNOSIS — M9901 Segmental and somatic dysfunction of cervical region: Secondary | ICD-10-CM | POA: Diagnosis not present

## 2016-11-26 DIAGNOSIS — M50323 Other cervical disc degeneration at C6-C7 level: Secondary | ICD-10-CM | POA: Diagnosis not present

## 2016-11-26 DIAGNOSIS — M546 Pain in thoracic spine: Secondary | ICD-10-CM | POA: Diagnosis not present

## 2016-11-26 DIAGNOSIS — M62830 Muscle spasm of back: Secondary | ICD-10-CM | POA: Diagnosis not present

## 2016-11-26 DIAGNOSIS — M9902 Segmental and somatic dysfunction of thoracic region: Secondary | ICD-10-CM | POA: Diagnosis not present

## 2016-11-26 DIAGNOSIS — M50322 Other cervical disc degeneration at C5-C6 level: Secondary | ICD-10-CM | POA: Diagnosis not present

## 2016-11-30 ENCOUNTER — Other Ambulatory Visit: Payer: Self-pay | Admitting: Internal Medicine

## 2016-12-04 ENCOUNTER — Other Ambulatory Visit: Payer: Self-pay | Admitting: Internal Medicine

## 2016-12-19 ENCOUNTER — Other Ambulatory Visit: Payer: Self-pay | Admitting: Internal Medicine

## 2016-12-24 DIAGNOSIS — T83721D Exposure of implanted vaginal mesh and other prosthetic materials into vagina, subsequent encounter: Secondary | ICD-10-CM | POA: Diagnosis not present

## 2016-12-24 DIAGNOSIS — N390 Urinary tract infection, site not specified: Secondary | ICD-10-CM | POA: Diagnosis not present

## 2016-12-26 DIAGNOSIS — M50322 Other cervical disc degeneration at C5-C6 level: Secondary | ICD-10-CM | POA: Diagnosis not present

## 2016-12-26 DIAGNOSIS — M50323 Other cervical disc degeneration at C6-C7 level: Secondary | ICD-10-CM | POA: Diagnosis not present

## 2016-12-26 DIAGNOSIS — M9901 Segmental and somatic dysfunction of cervical region: Secondary | ICD-10-CM | POA: Diagnosis not present

## 2016-12-26 DIAGNOSIS — M9902 Segmental and somatic dysfunction of thoracic region: Secondary | ICD-10-CM | POA: Diagnosis not present

## 2016-12-26 DIAGNOSIS — M62830 Muscle spasm of back: Secondary | ICD-10-CM | POA: Diagnosis not present

## 2016-12-26 DIAGNOSIS — M546 Pain in thoracic spine: Secondary | ICD-10-CM | POA: Diagnosis not present

## 2017-01-24 DIAGNOSIS — M9902 Segmental and somatic dysfunction of thoracic region: Secondary | ICD-10-CM | POA: Diagnosis not present

## 2017-01-24 DIAGNOSIS — M62830 Muscle spasm of back: Secondary | ICD-10-CM | POA: Diagnosis not present

## 2017-01-24 DIAGNOSIS — M50323 Other cervical disc degeneration at C6-C7 level: Secondary | ICD-10-CM | POA: Diagnosis not present

## 2017-01-24 DIAGNOSIS — M50322 Other cervical disc degeneration at C5-C6 level: Secondary | ICD-10-CM | POA: Diagnosis not present

## 2017-01-24 DIAGNOSIS — M546 Pain in thoracic spine: Secondary | ICD-10-CM | POA: Diagnosis not present

## 2017-01-24 DIAGNOSIS — M9901 Segmental and somatic dysfunction of cervical region: Secondary | ICD-10-CM | POA: Diagnosis not present

## 2017-01-31 ENCOUNTER — Other Ambulatory Visit: Payer: Self-pay | Admitting: Emergency Medicine

## 2017-01-31 MED ORDER — CLONAZEPAM 0.5 MG PO TABS: ORAL_TABLET | ORAL | 0 refills | Status: DC

## 2017-01-31 NOTE — Telephone Encounter (Signed)
RX faxed to Mail order 

## 2017-03-05 ENCOUNTER — Ambulatory Visit (INDEPENDENT_AMBULATORY_CARE_PROVIDER_SITE_OTHER): Payer: Medicare Other | Admitting: Nurse Practitioner

## 2017-03-05 ENCOUNTER — Encounter: Payer: Self-pay | Admitting: Nurse Practitioner

## 2017-03-05 ENCOUNTER — Other Ambulatory Visit: Payer: Medicare Other

## 2017-03-05 VITALS — BP 140/68 | HR 61 | Temp 97.8°F | Ht 66.0 in | Wt 186.0 lb

## 2017-03-05 DIAGNOSIS — R159 Full incontinence of feces: Secondary | ICD-10-CM

## 2017-03-05 DIAGNOSIS — N321 Vesicointestinal fistula: Secondary | ICD-10-CM

## 2017-03-05 DIAGNOSIS — R350 Frequency of micturition: Secondary | ICD-10-CM | POA: Diagnosis not present

## 2017-03-05 DIAGNOSIS — N390 Urinary tract infection, site not specified: Secondary | ICD-10-CM

## 2017-03-05 DIAGNOSIS — B029 Zoster without complications: Secondary | ICD-10-CM | POA: Diagnosis not present

## 2017-03-05 DIAGNOSIS — R300 Dysuria: Secondary | ICD-10-CM

## 2017-03-05 LAB — POCT URINALYSIS DIPSTICK
Bilirubin, UA: NEGATIVE
Glucose, UA: NEGATIVE
Ketones, UA: NEGATIVE
Leukocytes, UA: NEGATIVE
Nitrite, UA: NEGATIVE
PROTEIN UA: NEGATIVE
RBC UA: NEGATIVE
SPEC GRAV UA: 1.015 (ref 1.010–1.025)
UROBILINOGEN UA: 0.2 U/dL
pH, UA: 6 (ref 5.0–8.0)

## 2017-03-05 MED ORDER — VALACYCLOVIR HCL 1 G PO TABS: 1000.0000 mg | ORAL_TABLET | Freq: Three times a day (TID) | ORAL | 0 refills | Status: DC

## 2017-03-05 MED ORDER — URIBEL 118 MG PO CAPS: 1.0000 | ORAL_CAPSULE | Freq: Three times a day (TID) | ORAL | 0 refills | Status: DC | PRN

## 2017-03-05 NOTE — Progress Notes (Signed)
Subjective:  Patient ID: [name], female    DOB: 02-10-1941  Age: 76 y.o. MRN: [id number]  CC: Urinary Tract Infection (burning when urinate-frequent urinate--back pain/itching/red spot at times-had surgery--)   Dysuria   This is a recurrent problem. The current episode started more than 1 month ago. The problem occurs every urination. The problem has been waxing and waning. The quality of the pain is described as burning. There has been no fever. She is not sexually active. There is no history of pyelonephritis. Associated symptoms include frequency and urgency. Pertinent negatives include no chills, discharge, flank pain, hematuria, hesitancy or nausea. She has tried increased fluids and antibiotics for the symptoms. The treatment provided mild relief. Her past medical history is significant for recurrent UTIs and urinary stasis.  Rash  This is a new problem. Episode onset: 2days ago. The problem is unchanged. The affected locations include the back. The rash is characterized by redness, burning and itchiness. She was exposed to nothing. Pertinent negatives include no cough, fatigue, fever or shortness of breath. Past treatments include nothing. Her past medical history is significant for varicella. There is no history of allergies or eczema.  treated for uti twice in last 6months. Dysuria since 12/2016. Urology at [facility]  Hx of chronic intermittent diarrhea, recent flare 2days ago, triggered by consumption of sausage.  Outpatient Medications Prior to Visit  Medication Sig Dispense Refill  . aspirin 81 MG tablet Take 81 mg by mouth at bedtime.     . benazepril (LOTENSIN) 20 MG tablet TAKE 1 TABLET DAILY 90 tablet 1  . Blood Glucose Monitoring Suppl (FREESTYLE LITE) DEVI Use meter daily to check blood sugar as instructed. 1 each 3  . calcium citrate-vitamin D 500-400 MG-UNIT chewable tablet Chew 1 tablet by mouth at bedtime.     . Cholecalciferol (VITAMIN D3) 1000 UNITS  CAPS Take 1 capsule by mouth daily.    . clonazePAM (KLONOPIN) 0.5 MG tablet TAKE ONE TABLET BY MOUTH ONCE DAILY AS NEEDED FOR ANXIETY -- Office visit needed for further refills 90 tablet 0  . CRANBERRY PO Take 300 mg by mouth daily.    . cycloSPORINE (RESTASIS) 0.05 % ophthalmic emulsion Place 1 drop into both eyes 2 (two) times daily.    Marland Kitchen escitalopram (LEXAPRO) 10 MG tablet Take 1 tablet (10 mg total) by mouth daily. 90 tablet 1  . glimepiride (AMARYL) 2 MG tablet TAKE 1 TABLET TWICE A DAY 180 tablet 1  . glucose blood (FREESTYLE LITE) test strip USE TO CHECK BLOOD SUGAR DAILY AS DIRECTED 100 each 2  . JANUVIA 50 MG tablet TAKE 1 TABLET DAILY 90 tablet 1  . Lancets (FREESTYLE) lancets Use as instructed 100 each 2  . metFORMIN (GLUCOPHAGE) 1000 MG tablet TAKE 1 TABLET WITH BREAKFAST, ONE-HALF (1/2) TABLET WITH LUNCH AND 1 TABLET WITH DINNER 225 tablet 1  . methylcellulose (ARTIFICIAL TEARS) 1 % ophthalmic solution Place 1 drop into both eyes daily as needed (itching eyes).     . Multiple Vitamins-Calcium (ONE-A-DAY WOMENS FORMULA) TABS Take 1 tablet by mouth daily.    . pravastatin (PRAVACHOL) 40 MG tablet TAKE 1 TABLET AT BEDTIME 90 tablet 1  . nitrofurantoin, macrocrystal-monohydrate, (MACROBID) 100 MG capsule Take 1 capsule (100 mg total) by mouth 2 (two) times daily. 14 capsule 0  . phenazopyridine (PYRIDIUM) 100 MG tablet Take 1 tablet (100 mg total) by mouth 3 (three) times daily as needed for pain (with food). 10 tablet 0  Facility-Administered Medications Prior to Visit  Medication Dose Route Frequency Provider Last Rate Last Dose  . 0.9 %  sodium chloride infusion  500 mL Intravenous Continuous Armbruster, Renelda Loma, MD        ROS See HPI  Objective:  BP 140/68   Pulse 61   Temp 97.8 F (36.6 C)   Ht 5\' 6"  (1.676 m)   Wt 186 lb (84.4 kg)   SpO2 97%   BMI 30.02 kg/m   BP Readings from Last 3 Encounters:  03/05/17 140/68  11/13/16 135/66  10/31/16 124/68    Wt  Readings from Last 3 Encounters:  03/05/17 186 lb (84.4 kg)  11/13/16 185 lb (83.9 kg)  10/31/16 185 lb (83.9 kg)    Physical Exam  Constitutional: She is oriented to person, place, and time. No distress.  Cardiovascular: Normal rate.   Pulmonary/Chest: Effort normal.  Abdominal: Soft. She exhibits no distension. There is no tenderness.  Neurological: She is alert and oriented to person, place, and time.  Skin: Skin is warm and dry. Rash noted. Rash is macular. There is erythema.     Vitals reviewed.   Lab Results  Component Value Date   WBC 9.1 09/25/2016   HGB 14.0 09/25/2016   HCT 40.5 09/25/2016   PLT 244.0 09/25/2016   GLUCOSE 131 (H) 09/25/2016   CHOL 100 03/22/2016   TRIG 113.0 03/22/2016   HDL 33.00 (L) 03/22/2016   LDLCALC 44 03/22/2016   ALT 13 09/25/2016   AST 16 09/25/2016   NA 141 09/25/2016   K 3.9 09/25/2016   CL 105 09/25/2016   CREATININE 0.70 09/25/2016   BUN 20 09/25/2016   CO2 31 09/25/2016   TSH 2.87 03/22/2016   INR 1.8 (H) 04/02/2008   HGBA1C 6.4 09/25/2016   MICROALBUR <0.7 01/12/2015    No results found.  Assessment & Plan:   [name] was seen today for urinary tract infection.  Diagnoses and all orders for this visit:  Dysuria -     POCT urinalysis dipstick -     Urine Culture; Future -     Meth-Hyo-M Bl-Na Phos-Ph Sal (URIBEL) 118 MG CAPS; Take 1 capsule (118 mg total) by mouth 3 (three) times daily between meals as needed.  Frequent urination -     POCT urinalysis dipstick -     Urine Culture; Future -     Meth-Hyo-M Bl-Na Phos-Ph Sal (URIBEL) 118 MG CAPS; Take 1 capsule (118 mg total) by mouth 3 (three) times daily between meals as needed.  Herpes zoster without complication -     valACYclovir (VALTREX) 1000 MG tablet; Take 1 tablet (1,000 mg total) by mouth 3 (three) times daily.   I have discontinued [name]'s phenazopyridine and nitrofurantoin (macrocrystal-monohydrate). I am also having her start on URIBEL and  valACYclovir. Additionally, I am having her maintain her aspirin, Vitamin D3, calcium citrate-vitamin D, ONE-A-DAY WOMENS FORMULA, methylcellulose, cycloSPORINE, CRANBERRY PO, FREESTYLE LITE, freestyle, glucose blood, escitalopram, JANUVIA, metFORMIN, glimepiride, pravastatin, benazepril, and clonazePAM. We will continue to administer sodium chloride.  Meds ordered this encounter  Medications  . Meth-Hyo-M Bl-Na Phos-Ph Sal (URIBEL) 118 MG CAPS    Sig: Take 1 capsule (118 mg total) by mouth 3 (three) times daily between meals as needed.    Dispense:  20 capsule    Refill:  0    Order Specific Question:   Supervising Provider    Answer:   Cassandria Anger [[id number]]  . valACYclovir (VALTREX) 1000 MG  tablet    Sig: Take 1 tablet (1,000 mg total) by mouth 3 (three) times daily.    Dispense:  21 tablet    Refill:  0    Order Specific Question:   Supervising Provider    Answer:   Cassandria Anger [[id number]]    Follow-up: Return if symptoms worsen or fail to improve.  Wilfred Lacy, NP

## 2017-03-05 NOTE — Patient Instructions (Addendum)
Encourage adequate oral hydration. You will be called with urine culture results.  Please contact urology about recurrent dysuria and UTI.

## 2017-03-07 ENCOUNTER — Other Ambulatory Visit: Payer: Self-pay | Admitting: Internal Medicine

## 2017-03-07 LAB — URINE CULTURE

## 2017-03-07 MED ORDER — NITROFURANTOIN MONOHYD MACRO 100 MG PO CAPS: 100.0000 mg | ORAL_CAPSULE | Freq: Two times a day (BID) | ORAL | 0 refills | Status: DC

## 2017-03-11 ENCOUNTER — Telehealth: Payer: Self-pay | Admitting: Internal Medicine

## 2017-03-11 NOTE — Telephone Encounter (Signed)
Pt called back regarding her lab results. She can be reached at [phone] when you have a chance.

## 2017-03-18 NOTE — Progress Notes (Signed)
Pre visit review using our clinic review tool, if applicable. No additional management support is needed unless otherwise documented below in the visit note. 

## 2017-03-18 NOTE — Progress Notes (Addendum)
Subjective:   [name] is a 76 y.o. female who presents for Medicare Annual (Subsequent) preventive examination.  Review of Systems:  No ROS.  Medicare Wellness Visit. Additional risk factors are reflected in the social history.  Cardiac Risk Factors include: advanced age (>36men, >97 women);diabetes mellitus;hypertension Sleep patterns: feels rested on waking, gets up 1-2 times nightly to void and sleeps 6-8 hours nightly.    Home Safety/Smoke Alarms: Feels safe in home. Smoke alarms in place.  Living environment; residence and Firearm Safety: 1-story house/ trailer, no firearms. Lives alone, no needs for DME, good support system Seat Belt Safety/Bike Helmet: Wears seat belt.     Objective:     Vitals: BP (!) 148/78   Pulse 70   Resp 20   Ht 5\' 6"  (1.676 m)   Wt 186 lb (84.4 kg)   SpO2 97%   BMI 30.02 kg/m   Body mass index is 30.02 kg/m.   Tobacco History  Smoking Status  . Never Smoker  Smokeless Tobacco  . Never Used     Counseling given: Not Answered   Past Medical History:  Diagnosis Date  . Anxiety   . Arthritis HANDS, NECK , BACK  . Asymptomatic carotid artery stenosis LEFT --  MOD. PER DR WILLIS NOTE OCT 2012  . Benign heart murmur   . Depression    PMH of  . Diabetes mellitus ORAL MED  . DVT (deep venous thrombosis) ([place]) 2006   after arthroscopy  . Frequency of urination   . History of CVA (cerebrovascular accident) NEUROLOGIST  DR WILLIS----  06-01-2010-  LEFT BASAL GANGLIA HEMORRAGE   RESIDUAL RIGHT HAND NUMBNESS/TINGLING  . Hyperlipidemia   . Hypertension   . MRSA (methicillin resistant staph aureus) culture positive    X3; last post TKR  . Nocturia   . Numbness and tingling in right hand RESIDUAL FROM CVA NOV 2011  . Obesity    Redux therapy  . Osteoporosis   . Thyroid disease    right thyroid nodule-- BX DONE 08-15-2011   Past Surgical History:  Procedure Laterality Date  . ABDOMINAL HYSTERECTOMY  1973   Endometriosis &  fibroid  . CARPAL TUNNEL RELEASE  2000   RIGHT  . COLONOSCOPY  1999 & 2005   Dr Olevia Perches  . cystoscope     to evaluate recurrent UTIs  . CYSTOSCOPY W/ RETROGRADES  08/22/2011   Procedure: CYSTOSCOPY WITH RETROGRADE PYELOGRAM;  Surgeon: Molli Hazard, MD;  Location: [facility];  Service: Urology;  Laterality: Bilateral;  . CYSTOSCOPY WITH BIOPSY  08/22/2011   Procedure: CYSTOSCOPY WITH BIOPSY;  Surgeon: Molli Hazard, MD;  Location: [facility];  Service: Urology;  Laterality: N/A;  . g2 p2    . JOINT REPLACEMENT  03-28-2006   LEFT THUMB  . KNEE ARTHROSCOPY     BILATERAL PRIOR TO TOTAL KNEE  . LEFT SHOULDER SURG  1990'S  . LUMBAR LAMINECTOMY  08-26-2009   L 4 - 5  . THYROID NODULE BX  08-15-2011   RIGHT  . TOTAL KNEE ARTHROPLASTY  05-26-2002      LEFT   AND RIGHT  03-30-2098  . WRIST SURGERY  2014   tendon placed in joint; Dr Burney Gauze   Family History  Problem Relation Age of Onset  . Bladder Cancer Mother   . Diabetes Mother   . Brain cancer Sister   . Stroke Paternal Grandfather        in 64s  .  Diabetes Sister 9       TYPE 1   . Prostate cancer Brother   . Alzheimer's disease Brother   . Diabetes Maternal Aunt   . Diabetes Maternal Uncle   . Diabetes Maternal Grandmother        Type 2  . Diabetes Maternal Grandfather        Type 2  . Pancreatic cancer Sister   . Kidney failure Sister        in context of DM & influenza  . Heart disease Neg Hx    History  Sexual Activity  . Sexual activity: Not on file    Outpatient Encounter Prescriptions as of 03/19/2017  Medication Sig  . aspirin 81 MG tablet Take 81 mg by mouth at bedtime.   . benazepril (LOTENSIN) 20 MG tablet TAKE 1 TABLET DAILY  . Blood Glucose Monitoring Suppl (FREESTYLE LITE) DEVI Use meter daily to check blood sugar as instructed.  . calcium citrate-vitamin D 500-400 MG-UNIT chewable tablet Chew 1 tablet by mouth at bedtime.   . Cholecalciferol (VITAMIN  D3) 1000 UNITS CAPS Take 1 capsule by mouth daily.  . clonazePAM (KLONOPIN) 0.5 MG tablet TAKE ONE TABLET BY MOUTH ONCE DAILY AS NEEDED FOR ANXIETY -- Office visit needed for further refills  . CRANBERRY PO Take 300 mg by mouth daily.  . cycloSPORINE (RESTASIS) 0.05 % ophthalmic emulsion Place 1 drop into both eyes 2 (two) times daily.  Marland Kitchen escitalopram (LEXAPRO) 10 MG tablet TAKE 1 TABLET DAILY (DISCONTINUE CELEXA)  . glimepiride (AMARYL) 2 MG tablet TAKE 1 TABLET TWICE A DAY  . glucose blood (FREESTYLE LITE) test strip USE TO CHECK BLOOD SUGAR DAILY AS DIRECTED  . JANUVIA 50 MG tablet TAKE 1 TABLET DAILY  . Lancets (FREESTYLE) lancets Use as instructed  . metFORMIN (GLUCOPHAGE) 1000 MG tablet TAKE 1 TABLET WITH BREAKFAST, ONE-HALF (1/2) TABLET WITH LUNCH AND 1 TABLET WITH DINNER  . Meth-Hyo-M Bl-Na Phos-Ph Sal (URIBEL) 118 MG CAPS Take 1 capsule (118 mg total) by mouth 3 (three) times daily between meals as needed.  . methylcellulose (ARTIFICIAL TEARS) 1 % ophthalmic solution Place 1 drop into both eyes daily as needed (itching eyes).   . Multiple Vitamins-Calcium (ONE-A-DAY WOMENS FORMULA) TABS Take 1 tablet by mouth daily.  . pravastatin (PRAVACHOL) 40 MG tablet TAKE 1 TABLET AT BEDTIME  . [DISCONTINUED] nitrofurantoin, macrocrystal-monohydrate, (MACROBID) 100 MG capsule Take 1 capsule (100 mg total) by mouth 2 (two) times daily. (Patient not taking: Reported on 03/19/2017)  . [DISCONTINUED] valACYclovir (VALTREX) 1000 MG tablet Take 1 tablet (1,000 mg total) by mouth 3 (three) times daily. (Patient not taking: Reported on 03/19/2017)   Facility-Administered Encounter Medications as of 03/19/2017  Medication  . 0.9 %  sodium chloride infusion    Activities of Daily Living In your present state of health, do you have any difficulty performing the following activities: 03/19/2017  Hearing? N  Vision? N  Difficulty concentrating or making decisions? N  Walking or climbing stairs? N  Dressing  or bathing? N  Doing errands, shopping? N  Preparing Food and eating ? N  Using the Toilet? N  In the past six months, have you accidently leaked urine? Y  Comment followed by urology  Do you have problems with loss of bowel control? N  Managing your Medications? N  Managing your Finances? N  Housekeeping or managing your Housekeeping? N  Some recent data might be hidden    Patient Care Team: Binnie Rail,  MD as PCP - General (Internal Medicine)    Assessment:    Physical assessment deferred to PCP.  Exercise Activities and Dietary recommendations Current Exercise Habits: Structured exercise class, Type of exercise: walking (water aerobics), Time (Minutes): 45, Frequency (Times/Week): 3, Weekly Exercise (Minutes/Week): 135, Intensity: Moderate, Exercise limited by: None identified Diet (meal preparation, eat out, water intake, caffeinated beverages, dairy products, fruits and vegetables): in general, a "healthy" diet  , well balanced   Reviewed heart healthy and diabetic diet, encouraged patient to increase daily water intake. Diet education was attached to patient's AVS., Relevant patient education assigned to patient using Emmi.   Goals    . Exercise 150 minutes per week (moderate activity)          Exercise when cervical issues are better and according to Dr. Nicholes Calamity;      . make appointments with self          Will continue to take personal time in lieu of many caregiving activities    . Take time for myself and travel, enjoy life and family          Be involved in church and do my bible study, reconnect with friends.      Fall Risk Fall Risk  03/19/2017 03/28/2016 03/20/2016 01/12/2015 08/22/2012  Falls in the past year? No Yes Yes Yes No  Comment - - Emmi Telephone Survey: data to providers prior to load out in building; slipped on ramp; falling outside; tripping over hose or other -  Number falls in past yr: - 1 2 or more 2 or more -  Comment - - Emmi Telephone  Survey Actual Response = 2 - -  Injury with Fall? - Yes Yes No -  Risk for fall due to : - Impaired balance/gait - Other (Comment) -  Risk for fall due to: Comment - - - works on balance in pool -  Follow up - - - Education provided -   Depression Screen PHQ 2/9 Scores 03/19/2017 03/28/2016 04/12/2015 01/12/2015  PHQ - 2 Score 1 0 0 0  PHQ- 9 Score 3 - - -     Cognitive Function MMSE - Mini Mental State Exam 03/19/2017 01/12/2015  Not completed: - Unable to complete  Orientation to time 5 -  Orientation to Place 5 -  Registration 3 -  Attention/ Calculation 5 -  Recall 2 -  Language- name 2 objects 2 -  Language- repeat 1 -  Language- follow 3 step command 3 -  Language- read & follow direction 1 -  Write a sentence 1 -  Copy design 1 -  Total score 29 -        Immunization History  Administered Date(s) Administered  . Influenza Whole 07/24/1999, 06/13/2005, 06/17/2007, 04/14/2008, 04/11/2010  . Influenza, High Dose Seasonal PF 03/28/2016  . Influenza, Seasonal, Injecte, Preservative Fre 06/15/2014  . Influenza-Unspecified 04/22/2013, 05/02/2015  . Pneumococcal Conjugate-13 01/12/2015  . Pneumococcal Polysaccharide-23 08/03/2004  . Td 08/05/1996, 09/29/2010  . Zoster 12/21/2010   Screening Tests Health Maintenance  Topic Date Due  . INFLUENZA VACCINE  02/20/2017  . FOOT EXAM  03/28/2017  . HEMOGLOBIN A1C  03/28/2017  . OPHTHALMOLOGY EXAM  05/24/2017  . DEXA SCAN  04/26/2018  . TETANUS/TDAP  09/28/2020  . PNA vac Low Risk Adult  Completed      Plan:    Continue doing brain stimulating activities (puzzles, reading, adult coloring books, staying active) to keep memory sharp.   Continue to  eat heart healthy diet (full of fruits, vegetables, whole grains, lean protein, water--limit salt, fat, and sugar intake) and increase physical activity as tolerated.  I have personally reviewed and noted the following in the patient's chart:   . Medical and social history . Use  of alcohol, tobacco or illicit drugs  . Current medications and supplements . Functional ability and status . Nutritional status . Physical activity . Advanced directives . List of other physicians . Vitals . Screenings to include cognitive, depression, and falls . Referrals and appointments  In addition, I have reviewed and discussed with patient certain preventive protocols, quality metrics, and best practice recommendations. A written personalized care plan for preventive services as well as general preventive health recommendations were provided to patient.     Michiel Cowboy, RN  03/19/2017   Medical screening examination/treatment/procedure(s) were performed by non-physician practitioner and as supervising physician I was immediately available for consultation/collaboration. I agree with above. Binnie Rail, MD

## 2017-03-19 ENCOUNTER — Ambulatory Visit (INDEPENDENT_AMBULATORY_CARE_PROVIDER_SITE_OTHER): Payer: Medicare Other | Admitting: *Deleted

## 2017-03-19 VITALS — BP 148/78 | HR 70 | Resp 20 | Ht 66.0 in | Wt 186.0 lb

## 2017-03-19 DIAGNOSIS — Z23 Encounter for immunization: Secondary | ICD-10-CM | POA: Diagnosis not present

## 2017-03-19 DIAGNOSIS — Z0000 Encounter for general adult medical examination without abnormal findings: Secondary | ICD-10-CM | POA: Diagnosis not present

## 2017-03-19 NOTE — Patient Instructions (Addendum)
Continue doing brain stimulating activities (puzzles, reading, adult coloring books, staying active) to keep memory sharp.   Continue to eat heart healthy diet (full of fruits, vegetables, whole grains, lean protein, water--limit salt, fat, and sugar intake) and increase physical activity as tolerated.   [name] , Thank you for taking time to come for your Medicare Wellness Visit. I appreciate your ongoing commitment to your health goals. Please review the following plan we discussed and let me know if I can assist you in the future.   These are the goals we discussed: Goals    . Exercise 150 minutes per week (moderate activity)          Exercise when cervical issues are better and according to Dr. Nicholes Calamity;      . make appointments with self          Will continue to take personal time in lieu of many caregiving activities    . Take time for myself and travel, enjoy life and family          Be involve in church and do my bible study, reconnect with friends.       This is a list of the screening recommended for you and due dates:  Health Maintenance  Topic Date Due  . Flu Shot  02/20/2017  . Complete foot exam   03/28/2017  . Hemoglobin A1C  03/28/2017  . Eye exam for diabetics  05/24/2017  . DEXA scan (bone density measurement)  04/26/2018  . Tetanus Vaccine  09/28/2020  . Pneumonia vaccines  Completed   Influenza Virus Vaccine (Flucelvax) What is this medicine? INFLUENZA VIRUS VACCINE (in floo EN zuh VAHY ruhs vak SEEN) helps to reduce the risk of getting influenza also known as the flu. The vaccine only helps protect you against some strains of the flu. This medicine may be used for other purposes; ask your health care provider or pharmacist if you have questions. COMMON BRAND NAME(S): FLUCELVAX What should I tell my health care provider before I take this medicine? They need to know if you have any of these conditions: -bleeding disorder like hemophilia -fever or  infection -Guillain-Barre syndrome or other neurological problems -immune system problems -infection with the human immunodeficiency virus (HIV) or AIDS -low blood platelet counts -multiple sclerosis -an unusual or allergic reaction to influenza virus vaccine, other medicines, foods, dyes or preservatives -pregnant or trying to get pregnant -breast-feeding How should I use this medicine? This vaccine is for injection into a muscle. It is given by a health care professional. A copy of Vaccine Information Statements will be given before each vaccination. Read this sheet carefully each time. The sheet may change frequently. Talk to your pediatrician regarding the use of this medicine in children. Special care may be needed. Overdosage: If you think you've taken too much of this medicine contact a poison control center or emergency room at once. Overdosage: If you think you have taken too much of this medicine contact a poison control center or emergency room at once. NOTE: This medicine is only for you. Do not share this medicine with others. What if I miss a dose? This does not apply. What may interact with this medicine? -chemotherapy or radiation therapy -medicines that lower your immune system like etanercept, anakinra, infliximab, and adalimumab -medicines that treat or prevent blood clots like warfarin -phenytoin -steroid medicines like prednisone or cortisone -theophylline -vaccines This list may not describe all possible interactions. Give your health care provider a  list of all the medicines, herbs, non-prescription drugs, or dietary supplements you use. Also tell them if you smoke, drink alcohol, or use illegal drugs. Some items may interact with your medicine. What should I watch for while using this medicine? Report any side effects that do not go away within 3 days to your doctor or health care professional. Call your health care provider if any unusual symptoms occur within 6  weeks of receiving this vaccine. You may still catch the flu, but the illness is not usually as bad. You cannot get the flu from the vaccine. The vaccine will not protect against colds or other illnesses that may cause fever. The vaccine is needed every year. What side effects may I notice from receiving this medicine? Side effects that you should report to your doctor or health care professional as soon as possible: -allergic reactions like skin rash, itching or hives, swelling of the face, lips, or tongue Side effects that usually do not require medical attention (Report these to your doctor or health care professional if they continue or are bothersome.): -fever -headache -muscle aches and pains -pain, tenderness, redness, or swelling at the injection site -tiredness This list may not describe all possible side effects. Call your doctor for medical advice about side effects. You may report side effects to FDA at 1-800-FDA-1088. Where should I keep my medicine? The vaccine will be given by a health care professional in a clinic, pharmacy, doctor's office, or other health care setting. You will not be given vaccine doses to store at home. NOTE: This sheet is a summary. It may not cover all possible information. If you have questions about this medicine, talk to your doctor, pharmacist, or health care provider.  2018 Elsevier/Gold Standard (2011-06-20 14:06:47)    Carbohydrate Counting for Diabetes Mellitus, Adult Carbohydrate counting is a method for keeping track of how many carbohydrates you eat. Eating carbohydrates naturally increases the amount of sugar (glucose) in the blood. Counting how many carbohydrates you eat helps keep your blood glucose within normal limits, which helps you manage your diabetes (diabetes mellitus). It is important to know how many carbohydrates you can safely have in each meal. This is different for every person. A diet and nutrition specialist (registered  dietitian) can help you make a meal plan and calculate how many carbohydrates you should have at each meal and snack. Carbohydrates are found in the following foods:  Grains, such as breads and cereals.  Dried beans and soy products.  Starchy vegetables, such as potatoes, peas, and corn.  Fruit and fruit juices.  Milk and yogurt.  Sweets and snack foods, such as cake, cookies, candy, chips, and soft drinks.  How do I count carbohydrates? There are two ways to count carbohydrates in food. You can use either of the methods or a combination of both. Reading "Nutrition Facts" on packaged food The "Nutrition Facts" list is included on the labels of almost all packaged foods and beverages in the [place] It includes:  The serving size.  Information about nutrients in each serving, including the grams (g) of carbohydrate per serving.  To use the "Nutrition Facts":  Decide how many servings you will have.  Multiply the number of servings by the number of carbohydrates per serving.  The resulting number is the total amount of carbohydrates that you will be having.  Learning standard serving sizes of other foods When you eat foods containing carbohydrates that are not packaged or do not include "Nutrition Facts" on  the label, you need to measure the servings in order to count the amount of carbohydrates:  Measure the foods that you will eat with a food scale or measuring cup, if needed.  Decide how many standard-size servings you will eat.  Multiply the number of servings by 15. Most carbohydrate-rich foods have about 15 g of carbohydrates per serving. ? For example, if you eat 8 oz (170 g) of strawberries, you will have eaten 2 servings and 30 g of carbohydrates (2 servings x 15 g = 30 g).  For foods that have more than one food mixed, such as soups and casseroles, you must count the carbohydrates in each food that is included.  The following list contains standard serving sizes of  common carbohydrate-rich foods. Each of these servings has about 15 g of carbohydrates:   hamburger bun or  English muffin.   oz (15 mL) syrup.   oz (14 g) jelly.  1 slice of bread.  1 six-inch tortilla.  3 oz (85 g) cooked rice or pasta.  4 oz (113 g) cooked dried beans.  4 oz (113 g) starchy vegetable, such as peas, corn, or potatoes.  4 oz (113 g) hot cereal.  4 oz (113 g) mashed potatoes or  of a large baked potato.  4 oz (113 g) canned or frozen fruit.  4 oz (120 mL) fruit juice.  4-6 crackers.  6 chicken nuggets.  6 oz (170 g) unsweetened dry cereal.  6 oz (170 g) plain fat-free yogurt or yogurt sweetened with artificial sweeteners.  8 oz (240 mL) milk.  8 oz (170 g) fresh fruit or one small piece of fruit.  24 oz (680 g) popped popcorn.  Example of carbohydrate counting Sample meal  3 oz (85 g) chicken breast.  6 oz (170 g) brown rice.  4 oz (113 g) corn.  8 oz (240 mL) milk.  8 oz (170 g) strawberries with sugar-free whipped topping. Carbohydrate calculation 1. Identify the foods that contain carbohydrates: ? Rice. ? Corn. ? Milk. ? Strawberries. 2. Calculate how many servings you have of each food: ? 2 servings rice. ? 1 serving corn. ? 1 serving milk. ? 1 serving strawberries. 3. Multiply each number of servings by 15 g: ? 2 servings rice x 15 g = 30 g. ? 1 serving corn x 15 g = 15 g. ? 1 serving milk x 15 g = 15 g. ? 1 serving strawberries x 15 g = 15 g. 4. Add together all of the amounts to find the total grams of carbohydrates eaten: ? 30 g + 15 g + 15 g + 15 g = 75 g of carbohydrates total. This information is not intended to replace advice given to you by your health care provider. Make sure you discuss any questions you have with your health care provider. Document Released: 07/09/2005 Document Revised: 01/27/2016 Document Reviewed: 12/21/2015 Elsevier Interactive Patient Education  [employer].

## 2017-03-26 NOTE — Patient Instructions (Addendum)
  Test(s) ordered today. Your results will be released to [place] (or called to you) after review, usually within 72hours after test completion. If any changes need to be made, you will be notified at that same time.  All other Health Maintenance issues reviewed.   All recommended immunizations and age-appropriate screenings are up-to-date or discussed.  No immunizations administered today.   Medications reviewed and updated. No changes recommended at this time.   A referral was ordered for urology at [facility]  Please followup in 6 months

## 2017-03-26 NOTE — Progress Notes (Signed)
Subjective:    Patient ID: [name], female    DOB: March 17, 1941, 76 y.o.   MRN: [id number]  HPI The patient is here for follow up.  Hypertension: She is taking her medication daily. She is compliant with a low sodium diet.  She denies chest pain, palpitations, edema, shortness of breath and regular headaches. She is exercising regularly - water aerobics, walking some.      Diabetes: She is taking her medication daily as prescribed. She is compliant with a diabetic diet. She is exercising regularly. She monitors her sugars and they have been running 150-200. She is up-to-date with an ophthalmology examination.   Hyperlipidemia: She is taking her medication daily. She is compliant with a low fat/cholesterol diet. She is exercising regularly.     Anxiety: She is taking her medication daily as prescribed. She takes lexapro daily and clonazepam at night.  The clonazepam does not work as well and she has been been taking melatonin as needed.  She denies any side effects from the medication. She feels her anxiety is controlled and she is happy with her current dose of medication.   Frequent UTI's:  She has had frequent UTI's.  She has been following with  Urology.  She had a bladder sling operation about 4 years ago.  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    Medications and allergies reviewed with patient and updated if appropriate.  Patient Active Problem List   Diagnosis Date Noted  . Incontinence of feces 09/25/2016  . Diarrhea 09/25/2016  . Bilateral carotid artery stenosis 09/25/2016  . Generalized anxiety disorder 06/20/2015  . SUI (stress urinary incontinence, female) 02/05/2014  . 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 Cerebral artery occlusion with cerebral infarction ([place]) 05/31/2010  . DM (diabetes mellitus), secondary, uncontrolled, with renal complications ([place]) 59/56/3875  . THYROID NODULE, RIGHT 01/21/2009  . FATIGUE 07/12/2008  . Vitamin D deficiency  12/10/2007  . HYPERLIPIDEMIA 06/17/2007  . Essential hypertension 06/17/2007  . Osteopenia 06/17/2007  . Osteoarthrosis, unspecified whether generalized or localized, unspecified site 11/11/2006    Current Outpatient Prescriptions on File Prior to Visit  Medication Sig Dispense Refill  . aspirin 81 MG tablet Take 81 mg by mouth at bedtime.     . benazepril (LOTENSIN) 20 MG tablet TAKE 1 TABLET DAILY 90 tablet 1  . Blood Glucose Monitoring Suppl (FREESTYLE LITE) DEVI Use meter daily to check blood sugar as instructed. 1 each 3  . calcium citrate-vitamin D 500-400 MG-UNIT chewable tablet Chew 1 tablet by mouth at bedtime.     . Cholecalciferol (VITAMIN D3) 1000 UNITS CAPS Take 1 capsule by mouth daily.    . clonazePAM (KLONOPIN) 0.5 MG tablet TAKE ONE TABLET BY MOUTH ONCE DAILY AS NEEDED FOR ANXIETY -- Office visit needed for further refills 90 tablet 0  . CRANBERRY PO Take 300 mg by mouth daily.    . cycloSPORINE (RESTASIS) 0.05 % ophthalmic emulsion Place 1 drop into both eyes 2 (two) times daily.    Marland Kitchen escitalopram (LEXAPRO) 10 MG tablet TAKE 1 TABLET DAILY (DISCONTINUE CELEXA) 90 tablet 0  . glimepiride (AMARYL) 2 MG tablet TAKE 1 TABLET TWICE A DAY 180 tablet 1  . glucose blood (FREESTYLE LITE) test strip USE TO CHECK BLOOD SUGAR DAILY AS DIRECTED 100 each 2  . JANUVIA 50 MG tablet TAKE 1 TABLET DAILY 90 tablet 1  . Lancets (FREESTYLE) lancets Use as instructed 100 each 2  . metFORMIN (GLUCOPHAGE) 1000 MG  tablet TAKE 1 TABLET WITH BREAKFAST, ONE-HALF (1/2) TABLET WITH LUNCH AND 1 TABLET WITH DINNER 225 tablet 1  . Meth-Hyo-M Bl-Na Phos-Ph Sal (URIBEL) 118 MG CAPS Take 1 capsule (118 mg total) by mouth 3 (three) times daily between meals as needed. 20 capsule 0  . methylcellulose (ARTIFICIAL TEARS) 1 % ophthalmic solution Place 1 drop into both eyes daily as needed (itching eyes).     . Multiple Vitamins-Calcium (ONE-A-DAY WOMENS FORMULA) TABS Take 1 tablet by mouth daily.    . pravastatin  (PRAVACHOL) 40 MG tablet TAKE 1 TABLET AT BEDTIME 90 tablet 1   No current facility-administered medications on file prior to visit.     Past Medical History:  Diagnosis Date  . Anxiety   . Arthritis HANDS, NECK , BACK  . Asymptomatic carotid artery stenosis LEFT --  MOD. PER DR WILLIS NOTE OCT 2012  . Benign heart murmur   . Depression    PMH of  . Diabetes mellitus ORAL MED  . DVT (deep venous thrombosis) ([place]) 2006   after arthroscopy  . Frequency of urination   . History of CVA (cerebrovascular accident) NEUROLOGIST  DR WILLIS----  06-01-2010-  LEFT BASAL GANGLIA HEMORRAGE   RESIDUAL RIGHT HAND NUMBNESS/TINGLING  . Hyperlipidemia   . Hypertension   . MRSA (methicillin resistant staph aureus) culture positive    X3; last post TKR  . Nocturia   . Numbness and tingling in right hand RESIDUAL FROM CVA NOV 2011  . Obesity    Redux therapy  . Osteoporosis   . Thyroid disease    right thyroid nodule-- BX DONE 08-15-2011    Past Surgical History:  Procedure Laterality Date  . ABDOMINAL HYSTERECTOMY  1973   Endometriosis & fibroid  . CARPAL TUNNEL RELEASE  2000   RIGHT  . COLONOSCOPY  1999 & 2005   Dr Olevia Perches  . cystoscope     to evaluate recurrent UTIs  . CYSTOSCOPY W/ RETROGRADES  08/22/2011   Procedure: CYSTOSCOPY WITH RETROGRADE PYELOGRAM;  Surgeon: Molli Hazard, MD;  [facility];  Service: Urology;  Laterality: Bilateral;  . CYSTOSCOPY WITH BIOPSY  08/22/2011   Procedure: CYSTOSCOPY WITH BIOPSY;  Surgeon: Molli Hazard, MD;  Location: [facility];  Service: Urology;  Laterality: N/A;  . g2 p2    . JOINT REPLACEMENT  03-28-2006   LEFT THUMB  . KNEE ARTHROSCOPY     BILATERAL PRIOR TO TOTAL KNEE  . LEFT SHOULDER SURG  1990'S  . LUMBAR LAMINECTOMY  08-26-2009   L 4 - 5  . THYROID NODULE BX  08-15-2011   RIGHT  . TOTAL KNEE ARTHROPLASTY  05-26-2002      LEFT   AND RIGHT  03-30-2098  . WRIST SURGERY  2014    tendon placed in joint; Dr Burney Gauze    Social History   Social History  . Marital status: Widowed    Spouse name: N/A  . Number of children: 2  . Years of education: N/A   Social History Main Topics  . Smoking status: Never Smoker  . Smokeless tobacco: Never Used  . Alcohol use Yes     Comment:  very rarely , < 1 glass wine / month  . Drug use: No  . Sexual activity: Not on file   Other Topics Concern  . Not on file   Social History Narrative  . No narrative on file    Family History  Problem Relation Age of  Onset  . Bladder Cancer Mother   . Diabetes Mother   . Brain cancer Sister   . Stroke Paternal Grandfather        in 83s  . Diabetes Sister 10       TYPE 1   . Prostate cancer Brother   . Alzheimer's disease Brother   . Diabetes Maternal Aunt   . Diabetes Maternal Uncle   . Diabetes Maternal Grandmother        Type 2  . Diabetes Maternal Grandfather        Type 2  . Pancreatic cancer Sister   . Kidney failure Sister        in context of DM & influenza  . Heart disease Neg Hx     Review of Systems  Constitutional: Negative for chills and fever.  Respiratory: Negative for cough, shortness of breath and wheezing.   Cardiovascular: Negative for chest pain, palpitations and leg swelling.  Neurological: Positive for headaches (from neck pain). Negative for light-headedness.       Objective:   Vitals:   03/27/17 1126  BP: 132/64  Pulse: 70  Resp: 16  Temp: 98.1 F (36.7 C)  SpO2: 97%   Wt Readings from Last 3 Encounters:  03/27/17 186 lb (84.4 kg)  03/19/17 186 lb (84.4 kg)  03/05/17 186 lb (84.4 kg)   Body mass index is 30.02 kg/m.   Physical Exam    Constitutional: Appears well-developed and well-nourished. No distress.  HENT:  Head: Normocephalic and atraumatic.  Neck: Neck supple. No tracheal deviation present. No thyromegaly present.  No cervical lymphadenopathy Cardiovascular: Normal rate, regular rhythm and normal heart sounds.     No murmur heard. No carotid bruit .  No edema Pulmonary/Chest: Effort normal and breath sounds normal. No respiratory distress. No has no wheezes. No rales.  Skin: Skin is warm and dry. Not diaphoretic.  Psychiatric: Normal mood and affect. Behavior is normal.      Assessment & Plan:    See Problem List for Assessment and Plan of chronic medical problems.

## 2017-03-27 ENCOUNTER — Ambulatory Visit (INDEPENDENT_AMBULATORY_CARE_PROVIDER_SITE_OTHER): Payer: Medicare Other | Admitting: Internal Medicine

## 2017-03-27 ENCOUNTER — Other Ambulatory Visit (INDEPENDENT_AMBULATORY_CARE_PROVIDER_SITE_OTHER): Payer: Medicare Other

## 2017-03-27 ENCOUNTER — Encounter: Payer: Self-pay | Admitting: Internal Medicine

## 2017-03-27 VITALS — BP 132/64 | HR 70 | Temp 98.1°F | Resp 16 | Wt 186.0 lb

## 2017-03-27 DIAGNOSIS — F411 Generalized anxiety disorder: Secondary | ICD-10-CM | POA: Diagnosis not present

## 2017-03-27 DIAGNOSIS — E782 Mixed hyperlipidemia: Secondary | ICD-10-CM | POA: Diagnosis not present

## 2017-03-27 DIAGNOSIS — E041 Nontoxic single thyroid nodule: Secondary | ICD-10-CM

## 2017-03-27 DIAGNOSIS — N390 Urinary tract infection, site not specified: Secondary | ICD-10-CM

## 2017-03-27 DIAGNOSIS — E1365 Other specified diabetes mellitus with hyperglycemia: Secondary | ICD-10-CM | POA: Diagnosis not present

## 2017-03-27 DIAGNOSIS — I10 Essential (primary) hypertension: Secondary | ICD-10-CM

## 2017-03-27 DIAGNOSIS — IMO0002 Reserved for concepts with insufficient information to code with codable children: Secondary | ICD-10-CM

## 2017-03-27 DIAGNOSIS — E1329 Other specified diabetes mellitus with other diabetic kidney complication: Secondary | ICD-10-CM | POA: Diagnosis not present

## 2017-03-27 DIAGNOSIS — I6350 Cerebral infarction due to unspecified occlusion or stenosis of unspecified cerebral artery: Secondary | ICD-10-CM

## 2017-03-27 LAB — COMPREHENSIVE METABOLIC PANEL
ALBUMIN: 4.4 g/dL (ref 3.5–5.2)
ALK PHOS: 30 U/L — AB (ref 39–117)
ALT: 16 U/L (ref 0–35)
AST: 18 U/L (ref 0–37)
BUN: 15 mg/dL (ref 6–23)
CALCIUM: 9.7 mg/dL (ref 8.4–10.5)
CHLORIDE: 101 meq/L (ref 96–112)
CO2: 25 mEq/L (ref 19–32)
Creatinine, Ser: 0.63 mg/dL (ref 0.40–1.20)
GFR: 96.98 mL/min (ref >60.00)
Glucose, Bld: 212 mg/dL — ABNORMAL HIGH (ref 70–99)
POTASSIUM: 4.2 meq/L (ref 3.5–5.1)
Sodium: 136 mEq/L (ref 135–145)
Total Bilirubin: 0.6 mg/dL (ref 0.2–1.2)
Total Protein: 6.7 g/dL (ref 6.0–8.3)

## 2017-03-27 LAB — TSH: TSH: 5.14 u[IU]/mL — AB (ref 0.35–4.50)

## 2017-03-27 LAB — HEMOGLOBIN A1C: Hgb A1c MFr Bld: 6.9 % — ABNORMAL HIGH (ref 4.6–6.5)

## 2017-03-27 MED ORDER — ZOSTER VAC RECOMB ADJUVANTED 50 MCG/0.5ML IM SUSR: 0.5000 mL | Freq: Once | INTRAMUSCULAR | 1 refills | Status: AC

## 2017-03-27 MED ORDER — GLUCOSE BLOOD VI STRP: ORAL_STRIP | 2 refills | Status: DC

## 2017-03-27 NOTE — Assessment & Plan Note (Signed)
Continue statin. 

## 2017-03-27 NOTE — Assessment & Plan Note (Signed)
Check tsh - will monitor annually

## 2017-03-27 NOTE — Assessment & Plan Note (Signed)
Taking lexapro daily Takes clonazepam nightly Sleep not always controlled - has started melatonin

## 2017-03-27 NOTE — Assessment & Plan Note (Signed)
BP well controlled Current regimen effective and well tolerated Continue current medications at current doses cmp  

## 2017-03-27 NOTE — Assessment & Plan Note (Addendum)
Sugars not ideally controlled for a while due to increased stress with her boyfriend dying - she has been eating well over the past month and sugars have been better controlled at home Exercising regularly Will check a1c

## 2017-03-28 ENCOUNTER — Encounter: Payer: Self-pay | Admitting: Internal Medicine

## 2017-03-28 DIAGNOSIS — R7989 Other specified abnormal findings of blood chemistry: Secondary | ICD-10-CM | POA: Insufficient documentation

## 2017-03-29 ENCOUNTER — Other Ambulatory Visit: Payer: Self-pay | Admitting: Internal Medicine

## 2017-03-29 DIAGNOSIS — E039 Hypothyroidism, unspecified: Secondary | ICD-10-CM | POA: Insufficient documentation

## 2017-03-29 MED ORDER — LEVOTHYROXINE SODIUM 25 MCG PO TABS: 25.0000 ug | ORAL_TABLET | Freq: Every day | ORAL | 1 refills | Status: DC

## 2017-04-01 ENCOUNTER — Other Ambulatory Visit: Payer: Self-pay | Admitting: Internal Medicine

## 2017-04-10 DIAGNOSIS — R3914 Feeling of incomplete bladder emptying: Secondary | ICD-10-CM | POA: Diagnosis not present

## 2017-04-10 DIAGNOSIS — N3020 Other chronic cystitis without hematuria: Secondary | ICD-10-CM | POA: Diagnosis not present

## 2017-05-10 ENCOUNTER — Telehealth: Payer: Self-pay | Admitting: Internal Medicine

## 2017-05-10 NOTE — Telephone Encounter (Signed)
Pt called in and need a refill on her  clonazePAM (KLONOPIN) 0.5 MG tablet [[id number]]   [facility]

## 2017-05-10 NOTE — Telephone Encounter (Signed)
[place] Controlled Substance Database checked. Last filled on 02/04/17 for #90

## 2017-05-15 ENCOUNTER — Other Ambulatory Visit (INDEPENDENT_AMBULATORY_CARE_PROVIDER_SITE_OTHER): Payer: Medicare Other

## 2017-05-15 ENCOUNTER — Telehealth: Payer: Self-pay | Admitting: Internal Medicine

## 2017-05-15 DIAGNOSIS — E039 Hypothyroidism, unspecified: Secondary | ICD-10-CM | POA: Diagnosis not present

## 2017-05-15 LAB — TSH: TSH: 3.95 u[IU]/mL (ref 0.35–4.50)

## 2017-05-15 NOTE — Telephone Encounter (Signed)
Pt returned your call regarding her labs. I gave her MD response. She expressed understanding and did not have any questions at this time.

## 2017-05-15 NOTE — Telephone Encounter (Signed)
noted 

## 2017-05-17 ENCOUNTER — Other Ambulatory Visit: Payer: Self-pay | Admitting: *Deleted

## 2017-05-17 DIAGNOSIS — I6523 Occlusion and stenosis of bilateral carotid arteries: Secondary | ICD-10-CM

## 2017-05-21 ENCOUNTER — Other Ambulatory Visit: Payer: Self-pay | Admitting: Emergency Medicine

## 2017-05-21 MED ORDER — CLONAZEPAM 0.5 MG PO TABS: ORAL_TABLET | ORAL | 1 refills | Status: DC

## 2017-05-21 NOTE — Telephone Encounter (Signed)
[place] Controlled Substance Database checked. Last filled on  02/04/17

## 2017-05-21 NOTE — Telephone Encounter (Signed)
[facility]

## 2017-05-27 DIAGNOSIS — N819 Female genital prolapse, unspecified: Secondary | ICD-10-CM | POA: Diagnosis not present

## 2017-05-27 DIAGNOSIS — N390 Urinary tract infection, site not specified: Secondary | ICD-10-CM | POA: Diagnosis not present

## 2017-05-27 DIAGNOSIS — N393 Stress incontinence (female) (male): Secondary | ICD-10-CM | POA: Diagnosis not present

## 2017-05-27 DIAGNOSIS — N3020 Other chronic cystitis without hematuria: Secondary | ICD-10-CM | POA: Diagnosis not present

## 2017-05-29 ENCOUNTER — Other Ambulatory Visit: Payer: Self-pay | Admitting: Internal Medicine

## 2017-06-02 ENCOUNTER — Other Ambulatory Visit: Payer: Self-pay | Admitting: Internal Medicine

## 2017-06-05 ENCOUNTER — Other Ambulatory Visit: Payer: Self-pay | Admitting: Internal Medicine

## 2017-06-16 ENCOUNTER — Other Ambulatory Visit: Payer: Self-pay | Admitting: Internal Medicine

## 2017-06-17 DIAGNOSIS — M62838 Other muscle spasm: Secondary | ICD-10-CM | POA: Diagnosis not present

## 2017-06-17 DIAGNOSIS — M6281 Muscle weakness (generalized): Secondary | ICD-10-CM | POA: Diagnosis not present

## 2017-06-17 DIAGNOSIS — R152 Fecal urgency: Secondary | ICD-10-CM | POA: Diagnosis not present

## 2017-06-17 DIAGNOSIS — N393 Stress incontinence (female) (male): Secondary | ICD-10-CM | POA: Diagnosis not present

## 2017-06-17 DIAGNOSIS — N8111 Cystocele, midline: Secondary | ICD-10-CM | POA: Diagnosis not present

## 2017-06-28 DIAGNOSIS — M62838 Other muscle spasm: Secondary | ICD-10-CM | POA: Diagnosis not present

## 2017-06-28 DIAGNOSIS — N393 Stress incontinence (female) (male): Secondary | ICD-10-CM | POA: Diagnosis not present

## 2017-06-28 DIAGNOSIS — M6281 Muscle weakness (generalized): Secondary | ICD-10-CM | POA: Diagnosis not present

## 2017-06-28 DIAGNOSIS — R152 Fecal urgency: Secondary | ICD-10-CM | POA: Diagnosis not present

## 2017-08-02 DIAGNOSIS — R152 Fecal urgency: Secondary | ICD-10-CM | POA: Diagnosis not present

## 2017-08-02 DIAGNOSIS — R3914 Feeling of incomplete bladder emptying: Secondary | ICD-10-CM | POA: Diagnosis not present

## 2017-08-02 DIAGNOSIS — M6281 Muscle weakness (generalized): Secondary | ICD-10-CM | POA: Diagnosis not present

## 2017-08-02 DIAGNOSIS — M62838 Other muscle spasm: Secondary | ICD-10-CM | POA: Diagnosis not present

## 2017-08-02 DIAGNOSIS — N393 Stress incontinence (female) (male): Secondary | ICD-10-CM | POA: Diagnosis not present

## 2017-08-20 ENCOUNTER — Telehealth: Payer: Self-pay | Admitting: Internal Medicine

## 2017-08-20 MED ORDER — CLONAZEPAM 0.5 MG PO TABS: ORAL_TABLET | ORAL | 1 refills | Status: DC

## 2017-08-20 NOTE — Telephone Encounter (Signed)
Klonopin refill Last OV: 03/27/17 with Dr. Quay Burow Last Refill:05/21/17 with 90 tabs [facility]: [facility]

## 2017-08-20 NOTE — Telephone Encounter (Signed)
Check [place] registry last filled 05/27/2017.Marland KitchenJohny Chess

## 2017-08-20 NOTE — Telephone Encounter (Signed)
Copied from [place] [id number]. Topic: Quick Communication - Rx Refill/Question >> Aug 20, 2017 11:15 AM Hewitt Shorts wrote: Medication: klonopan    Has the patient contacted their pharmacy? no  (Agent: If no, request that the patient contact the pharmacy for the refill.)   Preferred Pharmacy (with phone number [facility] street [facility]   Best number [phone]   Agent: Please be advised that RX refills may take up to 3 business days. We ask that you follow-up with your pharmacy.

## 2017-08-23 DIAGNOSIS — R152 Fecal urgency: Secondary | ICD-10-CM | POA: Diagnosis not present

## 2017-08-23 DIAGNOSIS — R3914 Feeling of incomplete bladder emptying: Secondary | ICD-10-CM | POA: Diagnosis not present

## 2017-08-23 DIAGNOSIS — M6281 Muscle weakness (generalized): Secondary | ICD-10-CM | POA: Diagnosis not present

## 2017-08-23 DIAGNOSIS — R300 Dysuria: Secondary | ICD-10-CM | POA: Diagnosis not present

## 2017-08-23 DIAGNOSIS — N8111 Cystocele, midline: Secondary | ICD-10-CM | POA: Diagnosis not present

## 2017-08-23 DIAGNOSIS — M62838 Other muscle spasm: Secondary | ICD-10-CM | POA: Diagnosis not present

## 2017-09-07 ENCOUNTER — Other Ambulatory Visit: Payer: Self-pay | Admitting: Internal Medicine

## 2017-09-13 DIAGNOSIS — M62838 Other muscle spasm: Secondary | ICD-10-CM | POA: Diagnosis not present

## 2017-09-13 DIAGNOSIS — M6281 Muscle weakness (generalized): Secondary | ICD-10-CM | POA: Diagnosis not present

## 2017-09-13 DIAGNOSIS — R152 Fecal urgency: Secondary | ICD-10-CM | POA: Diagnosis not present

## 2017-09-13 DIAGNOSIS — N8111 Cystocele, midline: Secondary | ICD-10-CM | POA: Diagnosis not present

## 2017-09-13 DIAGNOSIS — N393 Stress incontinence (female) (male): Secondary | ICD-10-CM | POA: Diagnosis not present

## 2017-09-23 NOTE — Patient Instructions (Addendum)
  Test(s) ordered today. Your results will be released to [employer] (or called to you) after review, usually within 72hours after test completion. If any changes need to be made, you will be notified at that same time.  Ideally sugars should be between 120-160  Medications reviewed and updated.  Changes include incresing januvia to 100 mg daily.  Stop the metformin.  Start a new diabetic medication called Actos.     Your prescription(s) have been submitted to your pharmacy. Please take as directed and contact our office if you believe you are having problem(s) with the medication(s).   Please followup in 3 months

## 2017-09-23 NOTE — Progress Notes (Signed)
Subjective:    Patient ID: [name], female    DOB: 19-Mar-1941, 77 y.o.   MRN: [id number]  HPI The patient is here for follow up.    Diarrhea:  She has been having diarrhea for at least one year. It has gotten worse.  It occurs daily.  She denies abdominal pain and blood.  She wonders if this could be related to the metformin.  She wonders if this could be related to the metformin.  Hypertension: She is taking her medication daily. She is compliant with a low sodium diet.  She denies chest pain, palpitations, edema, shortness of breath and regular headaches. She is exercising regularly.      Hypothyroidism:  She is taking her medication daily.  She denies any recent changes in energy or weight that are unexplained.   Diabetes: She is taking her medication daily as prescribed. She is compliant with a diabetic diet. She is exercising regularly. She monitors her sugars and they have been running 157, 200 in the morning and a couple of times it is low at 112.  Her sugars are typically better in the afternoon and evening.  She checks her feet daily and denies foot lesions. She is up-to-date with an ophthalmology examination.   Hyperlipidemia: She is taking her medication daily. She is compliant with a low fat/cholesterol diet. She is exercising regularly. She denies myalgias.   Anxiety: She is taking her medication daily as prescribed. She denies any side effects from the medication. She feels her anxiety is well controlled and she is happy with her current dose of medication.    Medications and allergies reviewed with patient and updated if appropriate.  Patient Active Problem List   Diagnosis Date Noted  . Hypothyroidism 03/29/2017  . Incontinence of feces 09/25/2016  . Diarrhea 09/25/2016  . Bilateral carotid artery stenosis 09/25/2016  . Generalized anxiety disorder 06/20/2015  . SUI (stress urinary incontinence, female) 02/05/2014  . 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  Cerebral artery occlusion with cerebral infarction ([place]) 05/31/2010  . DM (diabetes mellitus), secondary, uncontrolled, with renal complications ([place]) 40/98/1191  . THYROID NODULE, RIGHT 01/21/2009  . Vitamin D deficiency 12/10/2007  . HYPERLIPIDEMIA 06/17/2007  . Essential hypertension 06/17/2007  . Osteopenia 06/17/2007  . Osteoarthrosis, unspecified whether generalized or localized, unspecified site 11/11/2006    Current Outpatient Medications on File Prior to Visit  Medication Sig Dispense Refill  . aspirin 81 MG tablet Take 81 mg by mouth at bedtime.     . benazepril (LOTENSIN) 20 MG tablet TAKE 1 TABLET DAILY 90 tablet 1  . Blood Glucose Monitoring Suppl (FREESTYLE LITE) DEVI Use meter daily to check blood sugar as instructed. 1 each 3  . calcium citrate-vitamin D 500-400 MG-UNIT chewable tablet Chew 1 tablet by mouth at bedtime.     . Cholecalciferol (VITAMIN D3) 1000 UNITS CAPS Take 1 capsule by mouth daily.    . clonazePAM (KLONOPIN) 0.5 MG tablet TAKE ONE TABLET BY MOUTH ONCE DAILY AS NEEDED FOR ANXIETY 90 tablet 1  . CRANBERRY PO Take 300 mg by mouth daily.    . cycloSPORINE (RESTASIS) 0.05 % ophthalmic emulsion Place 1 drop into both eyes 2 (two) times daily.    Marland Kitchen escitalopram (LEXAPRO) 10 MG tablet TAKE 1 TABLET DAILY (DISCONTINUE CELEXA) 90 tablet 1  . glimepiride (AMARYL) 2 MG tablet TAKE 1 TABLET TWICE A DAY 180 tablet 1  . glucose blood (FREESTYLE LITE) test strip USE TO CHECK BLOOD SUGAR  DAILY AS DIRECTED 100 each 2  . JANUVIA 50 MG tablet TAKE 1 TABLET DAILY 90 tablet 1  . Lancets (FREESTYLE) lancets Use as instructed 100 each 2  . metFORMIN (GLUCOPHAGE) 1000 MG tablet TAKE 1 TABLET WITH BREAKFAST, ONE-HALF (1/2) TABLET WITH LUNCH AND 1 TABLET WITH DINNER 225 tablet 1  . methylcellulose (ARTIFICIAL TEARS) 1 % ophthalmic solution Place 1 drop into both eyes daily as needed (itching eyes).     . Multiple Vitamins-Calcium (ONE-A-DAY WOMENS FORMULA) TABS Take 1 tablet by  mouth daily.    . pravastatin (PRAVACHOL) 40 MG tablet TAKE 1 TABLET AT BEDTIME 90 tablet 1  . SYNTHROID 25 MCG tablet TAKE 1 TABLET DAILY BEFORE BREAKFAST 90 tablet 0   No current facility-administered medications on file prior to visit.     Past Medical History:  Diagnosis Date  . Anxiety   . Arthritis HANDS, NECK , BACK  . Asymptomatic carotid artery stenosis LEFT --  MOD. PER DR WILLIS NOTE OCT 2012  . Benign heart murmur   . Depression    PMH of  . Diabetes mellitus ORAL MED  . DVT (deep venous thrombosis) ([place]) 2006   after arthroscopy  . Frequency of urination   . History of CVA (cerebrovascular accident) NEUROLOGIST  DR WILLIS----  06-01-2010-  LEFT BASAL GANGLIA HEMORRAGE   RESIDUAL RIGHT HAND NUMBNESS/TINGLING  . Hyperlipidemia   . Hypertension   . MRSA (methicillin resistant staph aureus) culture positive    X3; last post TKR  . Nocturia   . Numbness and tingling in right hand RESIDUAL FROM CVA NOV 2011  . Obesity    Redux therapy  . Osteoporosis   . Thyroid disease    right thyroid nodule-- BX DONE 08-15-2011    Past Surgical History:  Procedure Laterality Date  . ABDOMINAL HYSTERECTOMY  1973   Endometriosis & fibroid  . CARPAL TUNNEL RELEASE  2000   RIGHT  . COLONOSCOPY  1999 & 2005   Dr Olevia Perches  . cystoscope     to evaluate recurrent UTIs  . CYSTOSCOPY W/ RETROGRADES  08/22/2011   Procedure: CYSTOSCOPY WITH RETROGRADE PYELOGRAM;  Surgeon: Molli Hazard, MD;  Location: [facility];  Service: Urology;  Laterality: Bilateral;  . CYSTOSCOPY WITH BIOPSY  08/22/2011   Procedure: CYSTOSCOPY WITH BIOPSY;  Surgeon: Molli Hazard, MD;  Location: [facility];  Service: Urology;  Laterality: N/A;  . g2 p2    . JOINT REPLACEMENT  03-28-2006   LEFT THUMB  . KNEE ARTHROSCOPY     BILATERAL PRIOR TO TOTAL KNEE  . LEFT SHOULDER SURG  1990'S  . LUMBAR LAMINECTOMY  08-26-2009   L 4 - 5  . THYROID NODULE BX  08-15-2011    RIGHT  . TOTAL KNEE ARTHROPLASTY  05-26-2002      LEFT   AND RIGHT  03-30-2098  . WRIST SURGERY  2014   tendon placed in joint; Dr Burney Gauze    Social History   Socioeconomic History  . Marital status: Widowed    Spouse name: None  . Number of children: 2  . Years of education: None  . Highest education level: None  Social Needs  . Financial resource strain: None  . Food insecurity - worry: None  . Food insecurity - inability: None  . Transportation needs - medical: None  . Transportation needs - non-medical: None  Occupational History  . None  Tobacco Use  . Smoking status: Never Smoker  .  Smokeless tobacco: Never Used  Substance and Sexual Activity  . Alcohol use: Yes    Comment:  very rarely , < 1 glass wine / month  . Drug use: No  . Sexual activity: None  Other Topics Concern  . None  Social History Narrative  . None    Family History  Problem Relation Age of Onset  . Bladder Cancer Mother   . Diabetes Mother   . Brain cancer Sister   . Stroke Paternal Grandfather        in 107s  . Diabetes Sister 64       TYPE 1   . Prostate cancer Brother   . Alzheimer's disease Brother   . Diabetes Maternal Aunt   . Diabetes Maternal Uncle   . Diabetes Maternal Grandmother        Type 2  . Diabetes Maternal Grandfather        Type 2  . Pancreatic cancer Sister   . Kidney failure Sister        in context of DM & influenza  . Heart disease Neg Hx     Review of Systems  Constitutional: Negative for chills and fever.  Respiratory: Negative for cough, shortness of breath and wheezing.   Cardiovascular: Negative for chest pain, palpitations and leg swelling.  Gastrointestinal: Positive for diarrhea. Negative for abdominal pain and blood in stool.  Neurological: Positive for light-headedness and headaches.       Objective:   Vitals:   09/24/17 1146  BP: 138/78  Pulse: 78  Resp: 16  Temp: 98.7 F (37.1 C)  SpO2: 98%   Wt Readings from Last 3 Encounters:    09/24/17 183 lb (83 kg)  03/27/17 186 lb (84.4 kg)  03/19/17 186 lb (84.4 kg)   Body mass index is 29.54 kg/m.   Physical Exam    Constitutional: Appears well-developed and well-nourished. No distress.  HENT:  Head: Normocephalic and atraumatic.  Neck: Neck supple. No tracheal deviation present. No thyromegaly present.  No cervical lymphadenopathy Cardiovascular: Normal rate, regular rhythm and normal heart sounds.   No murmur heard. No carotid bruit .  No edema Pulmonary/Chest: Effort normal and breath sounds normal. No respiratory distress. No has no wheezes. No rales.  Skin: Skin is warm and dry. Not diaphoretic.  Psychiatric: Normal mood and affect. Behavior is normal.   Diabetic Foot Exam - Simple   Simple Foot Form Diabetic Foot exam was performed with the following findings:  Yes 09/24/2017 12:17 PM  Visual Inspection No deformities, no ulcerations, no other skin breakdown bilaterally:  Yes Sensation Testing Intact to touch and monofilament testing bilaterally:  Yes Pulse Check Posterior Tibialis and Dorsalis pulse intact bilaterally:  Yes Comments      Assessment & Plan:    See Problem List for Assessment and Plan of chronic medical problems.

## 2017-09-24 ENCOUNTER — Encounter: Payer: Self-pay | Admitting: Internal Medicine

## 2017-09-24 ENCOUNTER — Ambulatory Visit (INDEPENDENT_AMBULATORY_CARE_PROVIDER_SITE_OTHER): Payer: Medicare Other | Admitting: Internal Medicine

## 2017-09-24 ENCOUNTER — Other Ambulatory Visit (INDEPENDENT_AMBULATORY_CARE_PROVIDER_SITE_OTHER): Payer: Medicare Other

## 2017-09-24 VITALS — BP 138/78 | HR 78 | Temp 98.7°F | Resp 16 | Wt 183.0 lb

## 2017-09-24 DIAGNOSIS — E782 Mixed hyperlipidemia: Secondary | ICD-10-CM | POA: Diagnosis not present

## 2017-09-24 DIAGNOSIS — F411 Generalized anxiety disorder: Secondary | ICD-10-CM

## 2017-09-24 DIAGNOSIS — E039 Hypothyroidism, unspecified: Secondary | ICD-10-CM | POA: Diagnosis not present

## 2017-09-24 DIAGNOSIS — I10 Essential (primary) hypertension: Secondary | ICD-10-CM | POA: Diagnosis not present

## 2017-09-24 DIAGNOSIS — IMO0002 Reserved for concepts with insufficient information to code with codable children: Secondary | ICD-10-CM

## 2017-09-24 DIAGNOSIS — E1365 Other specified diabetes mellitus with hyperglycemia: Secondary | ICD-10-CM

## 2017-09-24 DIAGNOSIS — E1329 Other specified diabetes mellitus with other diabetic kidney complication: Secondary | ICD-10-CM

## 2017-09-24 LAB — COMPREHENSIVE METABOLIC PANEL
ALT: 16 U/L (ref 0–35)
AST: 16 U/L (ref 0–37)
Albumin: 4.4 g/dL (ref 3.5–5.2)
Alkaline Phosphatase: 33 U/L — ABNORMAL LOW (ref 39–117)
BUN: 19 mg/dL (ref 6–23)
CO2: 29 meq/L (ref 19–32)
Calcium: 10 mg/dL (ref 8.4–10.5)
Chloride: 100 mEq/L (ref 96–112)
Creatinine, Ser: 0.7 mg/dL (ref 0.40–1.20)
GFR: 85.77 mL/min (ref >60.00)
GLUCOSE: 270 mg/dL — AB (ref 70–99)
POTASSIUM: 4.4 meq/L (ref 3.5–5.1)
Sodium: 136 mEq/L (ref 135–145)
Total Bilirubin: 0.7 mg/dL (ref 0.2–1.2)
Total Protein: 7.1 g/dL (ref 6.0–8.3)

## 2017-09-24 LAB — TSH: TSH: 2.81 u[IU]/mL (ref 0.35–4.50)

## 2017-09-24 LAB — LIPID PANEL
CHOLESTEROL: 111 mg/dL (ref 0–200)
HDL: 37.5 mg/dL — ABNORMAL LOW (ref >39.00)
LDL CALC: 46 mg/dL (ref 0–99)
NONHDL: 73.05
Total CHOL/HDL Ratio: 3
Triglycerides: 136 mg/dL (ref 0.0–149.0)
VLDL: 27.2 mg/dL (ref 0.0–40.0)

## 2017-09-24 LAB — CBC WITH DIFFERENTIAL/PLATELET
BASOS PCT: 0.5 % (ref 0.0–3.0)
Basophils Absolute: 0 10*3/uL (ref 0.0–0.1)
EOS PCT: 2.7 % (ref 0.0–5.0)
Eosinophils Absolute: 0.2 10*3/uL (ref 0.0–0.7)
HCT: 40.8 % (ref 36.0–46.0)
Hemoglobin: 14.1 g/dL (ref 12.0–15.0)
LYMPHS ABS: 2 10*3/uL (ref 0.7–4.0)
Lymphocytes Relative: 23.1 % (ref 12.0–46.0)
MCHC: 34.6 g/dL (ref 30.0–36.0)
MCV: 88.6 fl (ref 78.0–100.0)
MONO ABS: 0.7 10*3/uL (ref 0.1–1.0)
MONOS PCT: 7.8 % (ref 3.0–12.0)
NEUTROS PCT: 65.9 % (ref 43.0–77.0)
Neutro Abs: 5.7 10*3/uL (ref 1.4–7.7)
Platelets: 232 10*3/uL (ref 150.0–400.0)
RBC: 4.61 Mil/uL (ref 3.87–5.11)
RDW: 13.2 % (ref 11.5–15.5)
WBC: 8.7 10*3/uL (ref 4.0–10.5)

## 2017-09-24 LAB — HEMOGLOBIN A1C: Hgb A1c MFr Bld: 7.3 % — ABNORMAL HIGH (ref 4.6–6.5)

## 2017-09-24 MED ORDER — SITAGLIPTIN PHOSPHATE 100 MG PO TABS: 100.0000 mg | ORAL_TABLET | Freq: Every day | ORAL | 3 refills | Status: DC

## 2017-09-24 MED ORDER — PIOGLITAZONE HCL 15 MG PO TABS: 15.0000 mg | ORAL_TABLET | Freq: Every day | ORAL | 5 refills | Status: DC

## 2017-09-24 NOTE — Assessment & Plan Note (Signed)
Check lipid panel  Continue daily statin Regular exercise and healthy diet encouraged  

## 2017-09-24 NOTE — Assessment & Plan Note (Signed)
Sugars not ideally controlled-higher in the morning, but better controlled in afternoon and evening Not tolerating metformin due to diarrhea-we will discontinue Increase Januvia to 100 mg daily Continue glimepiride 2 mg twice daily Add Actos 15 mg daily We will not try Jardiance given that she has frequent urinary tract infections.  She also has a family history of thyroid cancer and pancreatic cancer so we will hold off on medication like Trulicity Continue regular exercise and weight loss efforts

## 2017-09-24 NOTE — Assessment & Plan Note (Signed)
Controlled, stable Continue current dose of medication  

## 2017-09-24 NOTE — Assessment & Plan Note (Signed)
BP well controlled Current regimen effective and well tolerated Continue current medications at current doses cmp  

## 2017-09-24 NOTE — Assessment & Plan Note (Signed)
Check tsh  Titrate med dose if needed  

## 2017-09-25 DIAGNOSIS — N3946 Mixed incontinence: Secondary | ICD-10-CM | POA: Diagnosis not present

## 2017-09-25 DIAGNOSIS — M6281 Muscle weakness (generalized): Secondary | ICD-10-CM | POA: Diagnosis not present

## 2017-09-25 DIAGNOSIS — M62838 Other muscle spasm: Secondary | ICD-10-CM | POA: Diagnosis not present

## 2017-09-25 DIAGNOSIS — R152 Fecal urgency: Secondary | ICD-10-CM | POA: Diagnosis not present

## 2017-09-25 DIAGNOSIS — R3914 Feeling of incomplete bladder emptying: Secondary | ICD-10-CM | POA: Diagnosis not present

## 2017-09-25 DIAGNOSIS — N8111 Cystocele, midline: Secondary | ICD-10-CM | POA: Diagnosis not present

## 2017-10-04 ENCOUNTER — Telehealth: Payer: Self-pay | Admitting: Internal Medicine

## 2017-10-04 MED ORDER — PIOGLITAZONE HCL 15 MG PO TABS: 30.0000 mg | ORAL_TABLET | Freq: Every day | ORAL | 5 refills | Status: DC

## 2017-10-04 NOTE — Telephone Encounter (Signed)
Copied from [place] [id number]. Topic: Quick Communication - See Telephone Encounter >> Oct 04, 2017 10:03 AM Conception Chancy, NT wrote: CRM for notification. See Telephone encounter for:  10/04/17.  Patient is calling and states she was put on a new blood sugar medication called pioglitazone (ACTOS) 15 MG tablet. Patient states it is not working for her and was advised to let Dr. Quay Burow know. She checked her sugar this morning 10/04/17 and it was 233. Patient would like to know if something different can be called in. Please advise.    [place], [address]  [place] [address]  Phone: [phone] Fax: [phone]

## 2017-10-04 NOTE — Telephone Encounter (Signed)
Advised patient of dr burns note/instructions, patient repeated back for understanding---she will call back in about 2 weeks to let us know how sugars are trending

## 2017-10-04 NOTE — Telephone Encounter (Signed)
Have her start taking 2 of the Actos at the same time daily.  Have her give it a week or 2 to know if it is truly working or not.    Have her call us 1-2 weeks and let us know what her sugars are.

## 2017-10-08 DIAGNOSIS — N8111 Cystocele, midline: Secondary | ICD-10-CM | POA: Diagnosis not present

## 2017-10-08 DIAGNOSIS — R300 Dysuria: Secondary | ICD-10-CM | POA: Diagnosis not present

## 2017-10-08 DIAGNOSIS — M62838 Other muscle spasm: Secondary | ICD-10-CM | POA: Diagnosis not present

## 2017-10-08 DIAGNOSIS — N393 Stress incontinence (female) (male): Secondary | ICD-10-CM | POA: Diagnosis not present

## 2017-10-08 DIAGNOSIS — N3946 Mixed incontinence: Secondary | ICD-10-CM | POA: Diagnosis not present

## 2017-10-08 DIAGNOSIS — N3000 Acute cystitis without hematuria: Secondary | ICD-10-CM | POA: Diagnosis not present

## 2017-10-08 DIAGNOSIS — N819 Female genital prolapse, unspecified: Secondary | ICD-10-CM | POA: Diagnosis not present

## 2017-10-08 DIAGNOSIS — M6281 Muscle weakness (generalized): Secondary | ICD-10-CM | POA: Diagnosis not present

## 2017-10-11 ENCOUNTER — Other Ambulatory Visit: Payer: Self-pay | Admitting: *Deleted

## 2017-10-11 MED ORDER — PIOGLITAZONE HCL 15 MG PO TABS: 30.0000 mg | ORAL_TABLET | Freq: Every day | ORAL | 5 refills | Status: DC

## 2017-10-11 NOTE — Telephone Encounter (Signed)
Copied from [place]. Topic: Quick Communication - Rx Refill/Question >> Oct 11, 2017 10:20 AM [name] wrote: Medication: pioglitazone (ACTOS) 15 MG tablet [[id number]]  Has the patient contacted their pharmacy? No  (Agent: If no, request that the patient contact the pharmacy for the refill.) Preferred Pharmacy (with phone number or street name): [facility] on [address] Agent: Please be advised that RX refills may take up to 3 business days. We ask that you follow-up with your pharmacy.

## 2017-10-11 NOTE — Telephone Encounter (Signed)
Rx sent to [facility] with corrected number that patient is taking at this time.

## 2017-10-16 ENCOUNTER — Ambulatory Visit (HOSPITAL_COMMUNITY)
Admission: RE | Admit: 2017-10-16 | Discharge: 2017-10-16 | Disposition: A | Discharge status: Home / Self Care | Payer: Medicare Other | Source: Ambulatory Visit | Attending: Cardiology | Admitting: Cardiology

## 2017-10-16 DIAGNOSIS — Z8679 Personal history of other diseases of the circulatory system: Secondary | ICD-10-CM | POA: Diagnosis not present

## 2017-10-16 DIAGNOSIS — I6523 Occlusion and stenosis of bilateral carotid arteries: Secondary | ICD-10-CM | POA: Diagnosis not present

## 2017-10-16 DIAGNOSIS — Z09 Encounter for follow-up examination after completed treatment for conditions other than malignant neoplasm: Secondary | ICD-10-CM | POA: Diagnosis not present

## 2017-10-18 ENCOUNTER — Telehealth: Payer: Self-pay | Admitting: Internal Medicine

## 2017-10-18 NOTE — Telephone Encounter (Signed)
Copied from [place]. Topic: Quick Communication - See Telephone Encounter >> Oct 18, 2017 12:05 PM Cleaster Corin, NT wrote: CRM for notification. See Telephone encounter for: 10/18/17.  Pt. Calling to let Dr. Quay Burow that her sugar has been staying in the 200's was told to callback with two weeks of taking med. pioglitazone (ACTOS) 15 MG tablet [[id number]] pt. Has also been watching her diet. Cb# [phone]

## 2017-10-18 NOTE — Telephone Encounter (Signed)
Increase dose to 30 mg daily

## 2017-10-21 NOTE — Telephone Encounter (Signed)
PT increased Actos to 30 mg daily about mid March. Please advise.

## 2017-10-21 NOTE — Telephone Encounter (Signed)
Try increasing actos to 45 mg daily.  Does she want to consider low dose jardiance or farxiga that may increase her risk of a uti, but may not.    Does she want to consider restarting metformin 500 mg extended release that may not cause diarrhea - we would have to try it.

## 2017-10-22 DIAGNOSIS — N3946 Mixed incontinence: Secondary | ICD-10-CM | POA: Diagnosis not present

## 2017-10-22 DIAGNOSIS — M6281 Muscle weakness (generalized): Secondary | ICD-10-CM | POA: Diagnosis not present

## 2017-10-22 DIAGNOSIS — M545 Low back pain: Secondary | ICD-10-CM | POA: Diagnosis not present

## 2017-10-22 DIAGNOSIS — M62838 Other muscle spasm: Secondary | ICD-10-CM | POA: Diagnosis not present

## 2017-10-22 NOTE — Telephone Encounter (Signed)
Spoke with pt. Pt states she would like to increase the Actos to 45 mg daily.  She is not sure that she is taking 100 mg of Januvia daily, advised to checked dose and start taking 100 mg if she is not currently doing so.

## 2017-10-29 ENCOUNTER — Telehealth: Payer: Self-pay | Admitting: Internal Medicine

## 2017-10-29 MED ORDER — PIOGLITAZONE HCL 15 MG PO TABS: 45.0000 mg | ORAL_TABLET | Freq: Every day | ORAL | 2 refills | Status: DC

## 2017-10-29 NOTE — Telephone Encounter (Signed)
Copied from [place] [id number]. Topic: Quick Communication - See Telephone Encounter >> Oct 29, 2017  9:16 AM Cleaster Corin, NT wrote: CRM for notification. See Telephone encounter for: 10/29/17. Pt. States that the actos is working for her and states that she will need more and would like to have it sent to to [facility], [place] [address] Phone: [phone] Fax: [phone]

## 2017-11-06 DIAGNOSIS — Z1231 Encounter for screening mammogram for malignant neoplasm of breast: Secondary | ICD-10-CM | POA: Diagnosis not present

## 2017-11-06 LAB — HM MAMMOGRAPHY

## 2017-11-07 DIAGNOSIS — N3946 Mixed incontinence: Secondary | ICD-10-CM | POA: Diagnosis not present

## 2017-11-07 DIAGNOSIS — N8111 Cystocele, midline: Secondary | ICD-10-CM | POA: Diagnosis not present

## 2017-11-07 DIAGNOSIS — M6281 Muscle weakness (generalized): Secondary | ICD-10-CM | POA: Diagnosis not present

## 2017-11-07 DIAGNOSIS — M545 Low back pain: Secondary | ICD-10-CM | POA: Diagnosis not present

## 2017-11-07 DIAGNOSIS — R300 Dysuria: Secondary | ICD-10-CM | POA: Diagnosis not present

## 2017-11-07 DIAGNOSIS — M62838 Other muscle spasm: Secondary | ICD-10-CM | POA: Diagnosis not present

## 2017-11-12 ENCOUNTER — Encounter: Payer: Self-pay | Admitting: Internal Medicine

## 2017-11-29 DIAGNOSIS — E119 Type 2 diabetes mellitus without complications: Secondary | ICD-10-CM | POA: Diagnosis not present

## 2017-11-29 LAB — HM DIABETES EYE EXAM

## 2017-11-30 ENCOUNTER — Other Ambulatory Visit: Payer: Self-pay | Admitting: Internal Medicine

## 2017-12-02 ENCOUNTER — Other Ambulatory Visit: Payer: Self-pay | Admitting: Internal Medicine

## 2017-12-02 MED ORDER — ESCITALOPRAM OXALATE 10 MG PO TABS: ORAL_TABLET | ORAL | 1 refills | Status: DC

## 2017-12-02 NOTE — Addendum Note (Signed)
Addended by: Terence Lux B on: 12/02/2017 08:42 AM   Modules accepted: Orders

## 2017-12-06 ENCOUNTER — Other Ambulatory Visit: Payer: Self-pay | Admitting: Internal Medicine

## 2017-12-13 ENCOUNTER — Other Ambulatory Visit: Payer: Self-pay | Admitting: Internal Medicine

## 2017-12-19 ENCOUNTER — Encounter: Payer: Self-pay | Admitting: Internal Medicine

## 2017-12-25 ENCOUNTER — Ambulatory Visit (INDEPENDENT_AMBULATORY_CARE_PROVIDER_SITE_OTHER): Payer: Medicare Other | Admitting: Internal Medicine

## 2017-12-25 ENCOUNTER — Other Ambulatory Visit (INDEPENDENT_AMBULATORY_CARE_PROVIDER_SITE_OTHER): Payer: Medicare Other

## 2017-12-25 ENCOUNTER — Encounter: Payer: Self-pay | Admitting: Internal Medicine

## 2017-12-25 VITALS — BP 120/66 | HR 55 | Temp 97.8°F | Resp 16 | Wt 186.0 lb

## 2017-12-25 DIAGNOSIS — E039 Hypothyroidism, unspecified: Secondary | ICD-10-CM | POA: Diagnosis not present

## 2017-12-25 DIAGNOSIS — I6523 Occlusion and stenosis of bilateral carotid arteries: Secondary | ICD-10-CM | POA: Diagnosis not present

## 2017-12-25 DIAGNOSIS — E1365 Other specified diabetes mellitus with hyperglycemia: Secondary | ICD-10-CM

## 2017-12-25 DIAGNOSIS — IMO0002 Reserved for concepts with insufficient information to code with codable children: Secondary | ICD-10-CM

## 2017-12-25 DIAGNOSIS — E1329 Other specified diabetes mellitus with other diabetic kidney complication: Secondary | ICD-10-CM

## 2017-12-25 DIAGNOSIS — I10 Essential (primary) hypertension: Secondary | ICD-10-CM

## 2017-12-25 DIAGNOSIS — R197 Diarrhea, unspecified: Secondary | ICD-10-CM

## 2017-12-25 LAB — LIPID PANEL
CHOL/HDL RATIO: 3
Cholesterol: 139 mg/dL (ref 0–200)
HDL: 46.3 mg/dL (ref >39.00)
LDL CALC: 73 mg/dL (ref 0–99)
NonHDL: 92.6
Triglycerides: 99 mg/dL (ref 0.0–149.0)
VLDL: 19.8 mg/dL (ref 0.0–40.0)

## 2017-12-25 LAB — COMPREHENSIVE METABOLIC PANEL
ALT: 15 U/L (ref 0–35)
AST: 19 U/L (ref 0–37)
Albumin: 4.4 g/dL (ref 3.5–5.2)
Alkaline Phosphatase: 37 U/L — ABNORMAL LOW (ref 39–117)
BUN: 26 mg/dL — AB (ref 6–23)
CHLORIDE: 102 meq/L (ref 96–112)
CO2: 29 meq/L (ref 19–32)
CREATININE: 0.8 mg/dL (ref 0.40–1.20)
Calcium: 10 mg/dL (ref 8.4–10.5)
GFR: 73.47 mL/min (ref >60.00)
Glucose, Bld: 104 mg/dL — ABNORMAL HIGH (ref 70–99)
POTASSIUM: 4.2 meq/L (ref 3.5–5.1)
SODIUM: 138 meq/L (ref 135–145)
Total Bilirubin: 0.5 mg/dL (ref 0.2–1.2)
Total Protein: 7.1 g/dL (ref 6.0–8.3)

## 2017-12-25 LAB — TSH: TSH: 4.18 u[IU]/mL (ref 0.35–4.50)

## 2017-12-25 LAB — HEMOGLOBIN A1C: HEMOGLOBIN A1C: 6.6 % — AB (ref 4.6–6.5)

## 2017-12-25 MED ORDER — METFORMIN HCL ER 500 MG PO TB24: 500.0000 mg | ORAL_TABLET | Freq: Every day | ORAL | 1 refills | Status: DC

## 2017-12-25 MED ORDER — PIOGLITAZONE HCL 15 MG PO TABS: 30.0000 mg | ORAL_TABLET | Freq: Every day | ORAL | 2 refills | Status: DC

## 2017-12-25 NOTE — Patient Instructions (Addendum)
  Test(s) ordered today. Your results will be released to [place] (or called to you) after review, usually within 72hours after test completion. If any changes need to be made, you will be notified at that same time.   Medications reviewed and updated.  Changes include trying the extended release metformin and trying to get off some of the actos.    Your prescription(s) have been submitted to your pharmacy. Please take as directed and contact our office if you believe you are having problem(s) with the medication(s).   Please followup in 6 months

## 2017-12-25 NOTE — Assessment & Plan Note (Signed)
a1c Will try extended release metformin - increase to 2 pills daily if tolerated We will try to slowly decrease Actos If sugars well controlled Try to eliminate glimepiride Stressed the importance of continuing up with her regular exercise and diabetic diet Working on weight loss

## 2017-12-25 NOTE — Progress Notes (Signed)
Subjective:    Patient ID: [name], female    DOB: 1940/10/17, 77 y.o.   MRN: [id number]  HPI The patient is here for follow up.  Diabetes: She is taking her medication daily as prescribed. She takes two actos in the morning.  At noon she takes one glimepiride, januvia and 1/2 metformin and one glimepiride.  She is compliant with a diabetic diet-recently she has decreased her carbohydrate intake significantly. She is exercising regularly - water exercise. She monitors her sugars and they have been running 107-161, 97-160. She checks her feet daily and denies foot lesions. She is up-to-date with an ophthalmology examination.   Hypertension: She is taking her medication daily. She is compliant with a low sodium diet.  She denies chest pain, palpitations, shortness of breath and regular headaches. She is exercising regularly.      Diarrhea:  She has been having diarrhea for one year.  Three months ago we stopped the metformin.  The diarrhea stopped.  She is not taking one metformin and no diarrhea.   Hypothyroidism:  She is taking her medication daily.  She denies any recent changes in energy or weight that are unexplained.    Medications and allergies reviewed with patient and updated if appropriate.  Patient Active Problem List   Diagnosis Date Noted  . Hypothyroidism 03/29/2017  . Incontinence of feces 09/25/2016  . Diarrhea 09/25/2016  . Bilateral carotid artery stenosis 09/25/2016  . Generalized anxiety disorder 06/20/2015  . SUI (stress urinary incontinence, female) 02/05/2014  . 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 Cerebral artery occlusion with cerebral infarction ([place]) 05/31/2010  . DM (diabetes mellitus), secondary, uncontrolled, with renal complications ([place]) 09/32/6712  . THYROID NODULE, RIGHT 01/21/2009  . Vitamin D deficiency 12/10/2007  . HYPERLIPIDEMIA 06/17/2007  . Essential hypertension 06/17/2007  . Osteopenia 06/17/2007  . Osteoarthrosis, unspecified  whether generalized or localized, unspecified site 11/11/2006    Current Outpatient Medications on File Prior to Visit  Medication Sig Dispense Refill  . aspirin 81 MG tablet Take 81 mg by mouth at bedtime.     . benazepril (LOTENSIN) 20 MG tablet TAKE 1 TABLET DAILY 90 tablet 1  . Blood Glucose Monitoring Suppl (FREESTYLE LITE) DEVI Use meter daily to check blood sugar as instructed. 1 each 3  . calcium citrate-vitamin D 500-400 MG-UNIT chewable tablet Chew 1 tablet by mouth at bedtime.     . Cholecalciferol (VITAMIN D3) 1000 UNITS CAPS Take 1 capsule by mouth daily.    . clonazePAM (KLONOPIN) 0.5 MG tablet TAKE ONE TABLET BY MOUTH ONCE DAILY AS NEEDED FOR ANXIETY 90 tablet 1  . CRANBERRY PO Take 300 mg by mouth daily.    . cycloSPORINE (RESTASIS) 0.05 % ophthalmic emulsion Place 1 drop into both eyes 2 (two) times daily.    Marland Kitchen escitalopram (LEXAPRO) 10 MG tablet TAKE 1 TABLET DAILY (DISCONTINUE CELEXA) 90 tablet 1  . glimepiride (AMARYL) 2 MG tablet TAKE 1 TABLET TWICE A DAY 180 tablet 1  . glucose blood (FREESTYLE LITE) test strip USE TO CHECK BLOOD SUGAR DAILY AS DIRECTED 100 each 2  . Lancets (FREESTYLE) lancets Use as instructed 100 each 2  . metFORMIN (GLUCOPHAGE) 1000 MG tablet Take 500 mg by mouth at bedtime.    . methylcellulose (ARTIFICIAL TEARS) 1 % ophthalmic solution Place 1 drop into both eyes daily as needed (itching eyes).     . Multiple Vitamins-Calcium (ONE-A-DAY WOMENS FORMULA) TABS Take 1 tablet by mouth daily.    Marland Kitchen  pioglitazone (ACTOS) 15 MG tablet Take 3 tablets (45 mg total) by mouth daily. 90 tablet 2  . pravastatin (PRAVACHOL) 40 MG tablet TAKE 1 TABLET AT BEDTIME 90 tablet 1  . sitaGLIPtin (JANUVIA) 100 MG tablet Take 1 tablet (100 mg total) by mouth daily. 90 tablet 3  . SYNTHROID 25 MCG tablet TAKE 1 TABLET DAILY BEFORE BREAKFAST 90 tablet 1   No current facility-administered medications on file prior to visit.     Past Medical History:  Diagnosis Date  .  Anxiety   . Arthritis HANDS, NECK , BACK  . Asymptomatic carotid artery stenosis LEFT --  MOD. PER DR WILLIS NOTE OCT 2012  . Benign heart murmur   . Depression    PMH of  . Diabetes mellitus ORAL MED  . DVT (deep venous thrombosis) ([place]) 2006   after arthroscopy  . Frequency of urination   . History of CVA (cerebrovascular accident) NEUROLOGIST  DR WILLIS----  06-01-2010-  LEFT BASAL GANGLIA HEMORRAGE   RESIDUAL RIGHT HAND NUMBNESS/TINGLING  . Hyperlipidemia   . Hypertension   . MRSA (methicillin resistant staph aureus) culture positive    X3; last post TKR  . Nocturia   . Numbness and tingling in right hand RESIDUAL FROM CVA NOV 2011  . Obesity    Redux therapy  . Osteoporosis   . Thyroid disease    right thyroid nodule-- BX DONE 08-15-2011    Past Surgical History:  Procedure Laterality Date  . ABDOMINAL HYSTERECTOMY  1973   Endometriosis & fibroid  . CARPAL TUNNEL RELEASE  2000   RIGHT  . COLONOSCOPY  1999 & 2005   Dr Olevia Perches  . cystoscope     to evaluate recurrent UTIs  . CYSTOSCOPY W/ RETROGRADES  08/22/2011   Procedure: CYSTOSCOPY WITH RETROGRADE PYELOGRAM;  Surgeon: Molli Hazard, MD;  Location: [facility];  Service: Urology;  Laterality: Bilateral;  . CYSTOSCOPY WITH BIOPSY  08/22/2011   Procedure: CYSTOSCOPY WITH BIOPSY;  Surgeon: Molli Hazard, MD;  Location: [facility];  Service: Urology;  Laterality: N/A;  . g2 p2    . JOINT REPLACEMENT  03-28-2006   LEFT THUMB  . KNEE ARTHROSCOPY     BILATERAL PRIOR TO TOTAL KNEE  . LEFT SHOULDER SURG  1990'S  . LUMBAR LAMINECTOMY  08-26-2009   L 4 - 5  . THYROID NODULE BX  08-15-2011   RIGHT  . TOTAL KNEE ARTHROPLASTY  05-26-2002      LEFT   AND RIGHT  03-30-2098  . WRIST SURGERY  2014   tendon placed in joint; Dr Burney Gauze    Social History   Socioeconomic History  . Marital status: Widowed    Spouse name: Not on file  . Number of children: 2  . Years of  education: Not on file  . Highest education level: Not on file  Occupational History  . Not on file  Social Needs  . Financial resource strain: Not on file  . Food insecurity:    Worry: Not on file    Inability: Not on file  . Transportation needs:    Medical: Not on file    Non-medical: Not on file  Tobacco Use  . Smoking status: Never Smoker  . Smokeless tobacco: Never Used  Substance and Sexual Activity  . Alcohol use: Yes    Comment:  very rarely , < 1 glass wine / month  . Drug use: No  . Sexual activity: Not on  file  Lifestyle  . Physical activity:    Days per week: Not on file    Minutes per session: Not on file  . Stress: Not on file  Relationships  . Social connections:    Talks on phone: Not on file    Gets together: Not on file    Attends religious service: Not on file    Active member of club or organization: Not on file    Attends meetings of clubs or organizations: Not on file    Relationship status: Not on file  Other Topics Concern  . Not on file  Social History Narrative  . Not on file    Family History  Problem Relation Age of Onset  . Bladder Cancer Mother   . Diabetes Mother   . Brain cancer Sister   . Stroke Paternal Grandfather        in 42s  . Diabetes Sister 69       TYPE 1   . Prostate cancer Brother   . Alzheimer's disease Brother   . Diabetes Maternal Aunt   . Diabetes Maternal Uncle   . Diabetes Maternal Grandmother        Type 2  . Diabetes Maternal Grandfather        Type 2  . Pancreatic cancer Sister   . Kidney failure Sister        in context of DM & influenza  . Heart disease Neg Hx     Review of Systems  Constitutional: Negative for chills, fatigue and fever.  Respiratory: Negative for cough, shortness of breath and wheezing.   Cardiovascular: Positive for leg swelling (mild ). Negative for chest pain and palpitations.  Gastrointestinal: Negative for anal bleeding and diarrhea.  Neurological: Negative for  light-headedness and headaches.       Objective:   Vitals:   12/25/17 1422  BP: 120/66  Pulse: (!) 55  Resp: 16  Temp: 97.8 F (36.6 C)  SpO2: 98%   BP Readings from Last 3 Encounters:  12/25/17 120/66  09/24/17 138/78  03/27/17 132/64   Wt Readings from Last 3 Encounters:  12/25/17 186 lb (84.4 kg)  09/24/17 183 lb (83 kg)  03/27/17 186 lb (84.4 kg)   Body mass index is 30.02 kg/m.   Physical Exam    Constitutional: Appears well-developed and well-nourished. No distress.  HENT:  Head: Normocephalic and atraumatic.  Neck: Neck supple. No tracheal deviation present. No thyromegaly present.  No cervical lymphadenopathy Cardiovascular: Normal rate, regular rhythm and normal heart sounds.   No murmur heard. No carotid bruit .  No edema Pulmonary/Chest: Effort normal and breath sounds normal. No respiratory distress. No has no wheezes. No rales.  Skin: Skin is warm and dry. Not diaphoretic.  Psychiatric: Normal mood and affect. Behavior is normal.      Assessment & Plan:    See Problem List for Assessment and Plan of chronic medical problems.

## 2017-12-25 NOTE — Assessment & Plan Note (Signed)
BP well controlled Current regimen effective and well tolerated Continue current medications at current doses cmp  

## 2017-12-25 NOTE — Assessment & Plan Note (Signed)
Diarrhea stopped when she discontinued metformin Currently tolerating 500 mg once daily Will try 500 mg extended release once daily-if tolerated can try 2 pills daily, but will go back to 500 mg daily if she develops diarrhea

## 2017-12-25 NOTE — Assessment & Plan Note (Signed)
Check tsh  Titrate med dose if needed  

## 2018-01-30 ENCOUNTER — Other Ambulatory Visit: Payer: Self-pay | Admitting: Internal Medicine

## 2018-01-30 MED ORDER — CLONAZEPAM 0.5 MG PO TABS: ORAL_TABLET | ORAL | 1 refills | Status: DC

## 2018-01-30 NOTE — Telephone Encounter (Signed)
Clonazepam refill Last Refill:08/20/17 #90 with 1 refill Last OV: 12/25/17 PCP: Dr. Quay Burow Pharmacy:[facility]

## 2018-01-30 NOTE — Telephone Encounter (Signed)
[place] Controlled Substance Database checked. Last filled on 11/29/17 for 90 days.

## 2018-01-30 NOTE — Telephone Encounter (Signed)
Copied from [place] [id number]. Topic: Quick Communication - Rx Refill/Question >> Jan 30, 2018 10:45 AM [name] wrote: Medication: clonazePAM (KLONOPIN) 0.5 MG tablet [[id number]]   Has the patient contacted their pharmacy?no  (Agent: If no, request that the patient contact the pharmacy for the refill.) (Agent: If yes, when and what did the pharmacy advise?)  Preferred Pharmacy (with phone number or street name[facility]   Agent: Please be advised that RX refills may take up to 3 business days. We ask that you follow-up with your pharmacy.

## 2018-02-24 ENCOUNTER — Other Ambulatory Visit: Payer: Self-pay | Admitting: Internal Medicine

## 2018-02-27 DIAGNOSIS — Z6831 Body mass index (BMI) 31.0-31.9, adult: Secondary | ICD-10-CM | POA: Diagnosis not present

## 2018-02-27 DIAGNOSIS — N3945 Continuous leakage: Secondary | ICD-10-CM | POA: Diagnosis not present

## 2018-02-27 DIAGNOSIS — Z01419 Encounter for gynecological examination (general) (routine) without abnormal findings: Secondary | ICD-10-CM | POA: Diagnosis not present

## 2018-02-27 DIAGNOSIS — N390 Urinary tract infection, site not specified: Secondary | ICD-10-CM | POA: Diagnosis not present

## 2018-02-27 DIAGNOSIS — R319 Hematuria, unspecified: Secondary | ICD-10-CM | POA: Diagnosis not present

## 2018-03-06 ENCOUNTER — Ambulatory Visit (INDEPENDENT_AMBULATORY_CARE_PROVIDER_SITE_OTHER): Payer: Medicare Other | Admitting: Orthopaedic Surgery

## 2018-03-17 ENCOUNTER — Encounter (INDEPENDENT_AMBULATORY_CARE_PROVIDER_SITE_OTHER): Payer: Self-pay | Admitting: Orthopaedic Surgery

## 2018-03-17 ENCOUNTER — Ambulatory Visit (INDEPENDENT_AMBULATORY_CARE_PROVIDER_SITE_OTHER): Payer: Medicare Other | Admitting: Orthopaedic Surgery

## 2018-03-17 ENCOUNTER — Ambulatory Visit (INDEPENDENT_AMBULATORY_CARE_PROVIDER_SITE_OTHER): Payer: Medicare Other

## 2018-03-17 ENCOUNTER — Encounter (INDEPENDENT_AMBULATORY_CARE_PROVIDER_SITE_OTHER): Payer: Self-pay

## 2018-03-17 VITALS — BP 147/71 | HR 68 | Ht 66.0 in | Wt 183.0 lb

## 2018-03-17 DIAGNOSIS — G8929 Other chronic pain: Secondary | ICD-10-CM | POA: Diagnosis not present

## 2018-03-17 DIAGNOSIS — M25511 Pain in right shoulder: Secondary | ICD-10-CM

## 2018-03-17 MED ORDER — BUPIVACAINE HCL 0.5 % IJ SOLN
2.0000 mL | INTRAMUSCULAR | Status: AC | PRN
  Administered 2018-03-17: 2 mL via INTRA_ARTICULAR

## 2018-03-17 MED ORDER — METHYLPREDNISOLONE ACETATE 40 MG/ML IJ SUSP
80.0000 mg | INTRAMUSCULAR | Status: AC | PRN
  Administered 2018-03-17: 80 mg

## 2018-03-17 MED ORDER — LIDOCAINE HCL 2 % IJ SOLN
2.0000 mL | INTRAMUSCULAR | Status: AC | PRN
  Administered 2018-03-17: 2 mL

## 2018-03-17 NOTE — Progress Notes (Signed)
Office Visit Note   Patient: [name]           Date of Birth: 11/08/1940           MRN: [id number] Visit Date: 03/17/2018              Requested by: Barbara Rail, MD [place],  [id number] PCP: Barbara Rail, MD   Assessment & Plan: Visit Diagnoses:  1. Chronic right shoulder pain     Plan: Impingement syndrome right shoulder.  Diagnostic possibilities are several including rotator cuff tear.  Will inject the subacromial space with cortisone and monitor response over the next 3 to 4 weeks.  Also has a left long trigger finger I discussed different treatment options including local surgery or injection.  We will reevaluate that when she returns.  Office visit over 45 minutes 50% of the time in counseling guarding issues with her right shoulder left hand and even briefly in regards to both of her knees  Follow-Up Instructions: No follow-ups on file.   Orders:  Orders Placed This Encounter  Procedures  . XR Shoulder Right   No orders of the defined types were placed in this encounter.     Procedures: Large Joint Inj: R subacromial bursa on 03/17/2018 2:31 PM Indications: pain and diagnostic evaluation Details: 25 G 1.5 in needle, anterolateral approach  Arthrogram: No  Medications: 2 mL lidocaine 2 %; 2 mL bupivacaine 0.5 %; 80 mg methylPREDNISolone acetate 40 MG/ML Consent was given by the patient. Immediately prior to procedure a time out was called to verify the correct patient, procedure, equipment, support staff and site/side marked as required. Patient was prepped and draped in the usual sterile fashion.       Clinical Data: No additional findings.   Subjective: Chief Complaint  Patient presents with  . New Patient (Initial Visit)    R SHOULDER PAIN FROM HELPING SISTER MOVE. NO INJECTIONS OR INJURY  [name] is 77 years old visited the office for evaluation of right shoulder pain.  She first noted insidious onset approximately 6 weeks ago  without specific injury or trauma.  She is been helping move her sister's house that she is been placed in a nursing facility.  She is been having difficulty sleeping on that side and raising it over her head.  No evidence of ecchymosis swelling or erythema.  Denies any numbness or tingling. Has had chronic problems with her cervical spine relates that her neck presently is not creating her shoulder discomfort.  She had an MRI of her cervical spine in 2017 without any acute changes.  There were moderate degenerative changes at C4-5 through C6-7 with some moderate diffuse canal narrowing at those levels. Also notes that she is had some issues with her left long finger "catching"  HPI  Review of Systems  Constitutional: Negative for fatigue and fever.  HENT: Negative for ear pain.   Eyes: Negative for pain.  Respiratory: Negative for cough and shortness of breath.   Cardiovascular: Negative for leg swelling.  Gastrointestinal: Positive for diarrhea. Negative for constipation.  Genitourinary: Negative for difficulty urinating.  Musculoskeletal: Positive for back pain and neck pain.  Skin: Negative for rash.  Allergic/Immunologic: Negative for food allergies.  Neurological: Negative for weakness and numbness.  Hematological: Does not bruise/bleed easily.  Psychiatric/Behavioral: Positive for sleep disturbance.     Objective: Vital Signs: BP (!) 147/71 (BP Location: Left Arm, Patient Position: Sitting, Cuff Size: Normal)  Wallace 66   Ht 5\' 6"  (1.676 m)   Wt 183 lb (83 kg)   BMI 29.54 kg/m   Physical Exam  Constitutional: She is oriented to person, place, and time. She appears well-developed and well-nourished.  HENT:  Mouth/Throat: Oropharynx is clear and moist.  Eyes: Pupils are equal, round, and reactive to light. EOM are normal.  Pulmonary/Chest: Effort normal.  Neurological: She is alert and oriented to person, place, and time.  Skin: Skin is warm and dry.  Psychiatric: She has  a normal mood and affect. Her behavior is normal.    Ortho Exam awake alert and oriented x3.  Comfortable sitting.  Able to place her right arm over her head but slowly with a circuitous motion.  Positive impingement on the extremes of internal/external rotation.  Minimally positive empty can testing.  Good strength.  No crepitation Active triggering of left long finger.  Mild swelling of the digit.  Unable to touch the tip of the finger to the palm of her hand with a palpable painful nodule on the palmar aspect of the finger at the level of the metacarpal phalangeal joint area neurologically intact Specialty Comments:  No specialty comments available.  Imaging: No results found.   PMFS History: Patient Active Problem List   Diagnosis Date Noted  . Hypothyroidism 03/29/2017  . Incontinence of feces 09/25/2016  . Diarrhea 09/25/2016  . Bilateral carotid artery stenosis 09/25/2016  . Generalized anxiety disorder 06/20/2015  . SUI (stress urinary incontinence, female) 02/05/2014  . 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 Cerebral artery occlusion with cerebral infarction ([place]) 05/31/2010  . DM (diabetes mellitus), secondary, uncontrolled, with renal complications ([place]) 16/38/4665  . THYROID NODULE, RIGHT 01/21/2009  . Vitamin D deficiency 12/10/2007  . HYPERLIPIDEMIA 06/17/2007  . Essential hypertension 06/17/2007  . Osteopenia 06/17/2007  . Osteoarthrosis, unspecified whether generalized or localized, unspecified site 11/11/2006   Past Medical History:  Diagnosis Date  . Anxiety   . Arthritis HANDS, NECK , BACK  . Asymptomatic carotid artery stenosis LEFT --  MOD. PER DR [name] NOTE OCT 2012  . Benign heart murmur   . Depression    PMH of  . Diabetes mellitus ORAL MED  . DVT (deep venous thrombosis) ([place]) 2006   after arthroscopy  . Frequency of urination   . History of CVA (cerebrovascular accident) NEUROLOGIST  DR [name]----  06-01-2010-  LEFT BASAL GANGLIA HEMORRAGE    RESIDUAL RIGHT HAND NUMBNESS/TINGLING  . Hyperlipidemia   . Hypertension   . Hypothyroid   . MRSA (methicillin resistant staph aureus) culture positive    X3; last post TKR  . Nocturia   . Numbness and tingling in right hand RESIDUAL FROM CVA NOV 2011  . Obesity    Redux therapy  . Osteoporosis   . Thyroid disease    right thyroid nodule-- BX DONE 08-15-2011    Family History  Problem Relation Age of Onset  . Bladder Cancer Mother   . Diabetes Mother   . Brain cancer Sister   . Stroke Paternal Grandfather        in 52s  . Diabetes Sister 46       TYPE 1   . Prostate cancer Brother   . Alzheimer's disease Brother   . Diabetes Maternal Aunt   . Diabetes Maternal Uncle   . Diabetes Maternal Grandmother        Type 2  . Diabetes Maternal Grandfather        Type 2  .  Pancreatic cancer Sister   . Kidney failure Sister        in context of DM & influenza  . Heart disease Neg Hx     Past Surgical History:  Procedure Laterality Date  . ABDOMINAL HYSTERECTOMY  1973   Endometriosis & fibroid  . CARPAL TUNNEL RELEASE  2000   RIGHT  . COLONOSCOPY  1999 & 2005   Dr Olevia Perches  . cystoscope     to evaluate recurrent UTIs  . CYSTOSCOPY W/ RETROGRADES  08/22/2011   Procedure: CYSTOSCOPY WITH RETROGRADE PYELOGRAM;  Surgeon: Molli Hazard, MD;  Location: [facility];  Service: Urology;  Laterality: Bilateral;  . CYSTOSCOPY WITH BIOPSY  08/22/2011   Procedure: CYSTOSCOPY WITH BIOPSY;  Surgeon: Molli Hazard, MD;  Location: [facility];  Service: Urology;  Laterality: N/A;  . g2 p2    . JOINT REPLACEMENT  03-28-2006   LEFT THUMB  . KNEE ARTHROSCOPY     BILATERAL PRIOR TO TOTAL KNEE  . LEFT SHOULDER SURG  1990'S  . LUMBAR LAMINECTOMY  08-26-2009   L 4 - 5  . THYROID NODULE BX  08-15-2011   RIGHT  . TOTAL KNEE ARTHROPLASTY  05-26-2002      LEFT   AND RIGHT  03-30-2098  . WRIST SURGERY  2014   tendon placed in joint; Dr Burney Gauze    Social History   Occupational History  . Not on file  Tobacco Use  . Smoking status: Never Smoker  . Smokeless tobacco: Never Used  Substance and Sexual Activity  . Alcohol use: Yes    Comment:  very rarely , < 1 glass wine / month  . Drug use: No  . Sexual activity: Not on file

## 2018-03-19 NOTE — Progress Notes (Addendum)
Subjective:   [name] is a 77 y.o. female who presents for Medicare Annual (Subsequent) preventive examination.  Review of Systems:  No ROS.  Medicare Wellness Visit. Additional risk factors are reflected in the social history.  Cardiac Risk Factors include: advanced age (>60men, >43 women);diabetes mellitus;dyslipidemia;hypertension Sleep patterns: gets up 1 times nightly to void and sleeps 7 hours nightly.    Home Safety/Smoke Alarms: Feels safe in home. Smoke alarms in place.  Living environment; residence and Firearm Safety: 1-story house/ trailer, no firearms. Lives with daughter, no needs for DME, good support system Seat Belt Safety/Bike Helmet: Wears seat belt.      Objective:     Vitals: BP 133/74   Pulse 66   Resp 19   Ht 5\' 6"  (1.676 m)   Wt 182 lb (82.6 kg)   SpO2 98%   BMI 29.38 kg/m   Body mass index is 29.38 kg/m.  Advanced Directives 03/25/2018 03/19/2017 01/18/2016 02/28/2015 01/12/2015 08/15/2011  Does Patient Have a Medical Advance Directive? Yes Yes No Yes Yes Patient would like information  Type of Advance Directive [place];Living will [place];Living will - Living will;Healthcare Power of [place];Advance instruction for mental health treatment - -  Does patient want to make changes to medical advance directive? - - - No - Patient declined - -  Copy of [place] in Chart? No - copy requested No - copy requested - No - copy requested Yes -  Would patient like information on creating a medical advance directive? - - No - patient declined information - - -    Tobacco Social History   Tobacco Use  Smoking Status Never Smoker  Smokeless Tobacco Never Used     Counseling given: Not Answered  Past Medical History:  Diagnosis Date  . Anxiety   . Arthritis HANDS, NECK , BACK  . Asymptomatic carotid artery stenosis LEFT --  MOD. PER DR WILLIS NOTE OCT 2012  . Benign heart murmur   . Depression     PMH of  . Diabetes mellitus ORAL MED  . DVT (deep venous thrombosis) ([place]) 2006   after arthroscopy  . Frequency of urination   . History of CVA (cerebrovascular accident) NEUROLOGIST  DR WILLIS----  06-01-2010-  LEFT BASAL GANGLIA HEMORRAGE   RESIDUAL RIGHT HAND NUMBNESS/TINGLING  . Hyperlipidemia   . Hypertension   . Hypothyroid   . MRSA (methicillin resistant staph aureus) culture positive    X3; last post TKR  . Nocturia   . Numbness and tingling in right hand RESIDUAL FROM CVA NOV 2011  . Obesity    Redux therapy  . Osteoporosis   . Thyroid disease    right thyroid nodule-- BX DONE 08-15-2011   Past Surgical History:  Procedure Laterality Date  . ABDOMINAL HYSTERECTOMY  1973   Endometriosis & fibroid  . CARPAL TUNNEL RELEASE  2000   RIGHT  . COLONOSCOPY  1999 & 2005   Dr Olevia Perches  . cystoscope     to evaluate recurrent UTIs  . CYSTOSCOPY W/ RETROGRADES  08/22/2011   Procedure: CYSTOSCOPY WITH RETROGRADE PYELOGRAM;  Surgeon: Molli Hazard, MD;  Location[facility];  Service: Urology;  Laterality: Bilateral;  . CYSTOSCOPY WITH BIOPSY  08/22/2011   Procedure: CYSTOSCOPY WITH BIOPSY;  Surgeon: Molli Hazard, MD;  Location: [facility];  Service: Urology;  Laterality: N/A;  . g2 p2    . JOINT REPLACEMENT  03-28-2006   LEFT THUMB  . KNEE ARTHROSCOPY     BILATERAL PRIOR TO TOTAL KNEE  . LEFT SHOULDER SURG  1990'S  . LUMBAR LAMINECTOMY  08-26-2009   L 4 - 5  . THYROID NODULE BX  08-15-2011   RIGHT  . TOTAL KNEE ARTHROPLASTY  05-26-2002      LEFT   AND RIGHT  03-30-2098  . WRIST SURGERY  2014   tendon placed in joint; Dr Burney Gauze   Family History  Problem Relation Age of Onset  . Bladder Cancer Mother   . Diabetes Mother   . Brain cancer Sister   . Stroke Paternal Grandfather        in 38s  . Diabetes Sister 29       TYPE 1   . Prostate cancer Brother   . Alzheimer's disease Brother   . Diabetes Maternal Aunt   .  Diabetes Maternal Uncle   . Diabetes Maternal Grandmother        Type 2  . Diabetes Maternal Grandfather        Type 2  . Pancreatic cancer Sister   . Kidney failure Sister        in context of DM & influenza  . Heart disease Neg Hx    Social History   Socioeconomic History  . Marital status: Widowed    Spouse name: Not on file  . Number of children: 2  . Years of education: Not on file  . Highest education level: Not on file  Occupational History  . Not on file  Social Needs  . Financial resource strain: Not hard at all  . Food insecurity:    Worry: Never true    Inability: Never true  . Transportation needs:    Medical: No    Non-medical: No  Tobacco Use  . Smoking status: Never Smoker  . Smokeless tobacco: Never Used  Substance and Sexual Activity  . Alcohol use: Yes    Comment:  very rarely , < 1 glass Joelyn Lover / month  . Drug use: No  . Sexual activity: Not Currently  Lifestyle  . Physical activity:    Days per week: 5 days    Minutes per session: 50 min  . Stress: To some extent  Relationships  . Social connections:    Talks on phone: More than three times a week    Gets together: More than three times a week    Attends religious service: More than 4 times per year    Active member of club or organization: Yes    Attends meetings of clubs or organizations: More than 4 times per year    Relationship status: Widowed  Other Topics Concern  . Not on file  Social History Narrative  . Not on file    Outpatient Encounter Medications as of 03/25/2018  Medication Sig  . aspirin 81 MG tablet Take 81 mg by mouth at bedtime.   . benazepril (LOTENSIN) 20 MG tablet TAKE 1 TABLET DAILY  . Blood Glucose Monitoring Suppl (FREESTYLE LITE) DEVI Use meter daily to check blood sugar as instructed.  . calcium citrate-vitamin D 500-400 MG-UNIT chewable tablet Chew 1 tablet by mouth at bedtime.   . Cholecalciferol (VITAMIN D3) 1000 UNITS CAPS Take 1 capsule by mouth daily.  .  clonazePAM (KLONOPIN) 0.5 MG tablet TAKE ONE TABLET BY MOUTH ONCE DAILY AS NEEDED FOR ANXIETY  . CRANBERRY PO Take 300 mg by mouth daily.  . cycloSPORINE (RESTASIS) 0.05 %  ophthalmic emulsion Place 1 drop into both eyes 2 (two) times daily.  Marland Kitchen escitalopram (LEXAPRO) 10 MG tablet TAKE 1 TABLET DAILY (DISCONTINUE CELEXA)  . glimepiride (AMARYL) 2 MG tablet TAKE 1 TABLET TWICE A DAY  . glucose blood (FREESTYLE LITE) test strip USE TO CHECK BLOOD SUGAR DAILY AS DIRECTED  . Lancets (FREESTYLE) lancets Use as instructed  . metFORMIN (GLUCOPHAGE) 1000 MG tablet Take 500 mg by mouth at bedtime.  . metFORMIN (GLUCOPHAGE-XR) 500 MG 24 hr tablet TAKE 1 TABLET BY MOUTH ONCE DAILY WITH BREAKFAST  . methylcellulose (ARTIFICIAL TEARS) 1 % ophthalmic solution Place 1 drop into both eyes daily as needed (itching eyes).   . Multiple Vitamins-Calcium (ONE-A-DAY WOMENS FORMULA) TABS Take 1 tablet by mouth daily.  . pravastatin (PRAVACHOL) 40 MG tablet TAKE 1 TABLET AT BEDTIME  . sitaGLIPtin (JANUVIA) 100 MG tablet Take 1 tablet (100 mg total) by mouth daily.  Marland Kitchen SYNTHROID 25 MCG tablet TAKE 1 TABLET DAILY BEFORE BREAKFAST  . [DISCONTINUED] glucose blood (FREESTYLE LITE) test strip USE TO CHECK BLOOD SUGAR DAILY AS DIRECTED  . [DISCONTINUED] pioglitazone (ACTOS) 15 MG tablet Take 2 tablets (30 mg total) by mouth daily. (Patient not taking: Reported on 03/25/2018)   No facility-administered encounter medications on file as of 03/25/2018.     Activities of Daily Living In your present state of health, do you have any difficulty performing the following activities: 03/25/2018  Hearing? N  Vision? N  Difficulty concentrating or making decisions? N  Walking or climbing stairs? N  Dressing or bathing? N  Doing errands, shopping? N  Preparing Food and eating ? N  Using the Toilet? N  In the past six months, have you accidently leaked urine? N  Do you have problems with loss of bowel control? N  Managing your  Medications? N  Managing your Finances? N  Housekeeping or managing your Housekeeping? N  Some recent data might be hidden    Patient Care Team: Binnie Rail, MD as PCP - General (Internal Medicine)    Assessment:   This is a routine wellness examination for Rosio. Physical assessment deferred to PCP.   Exercise Activities and Dietary recommendations Current Exercise Habits: Structured exercise class;Home exercise routine, Type of exercise: walking(water aerobics), Time (Minutes): 55, Frequency (Times/Week): 4, Weekly Exercise (Minutes/Week): 220, Intensity: Mild, Exercise limited by: orthopedic condition(s)  Diet (meal preparation, eat out, water intake, caffeinated beverages, dairy products, fruits and vegetables): in general, a "healthy" diet  , well balanced   Reviewed heart healthy and diabetic diet. Encouraged patient to increase daily water and healthy fluid intake. Relevant patient education assigned to patient using Emmi.  Goals    . Exercise 150 minutes per week (moderate activity)     Exercise when cervical issues are better and according to Dr. Nicholes Calamity;      . make appointments with self     Will continue to take personal time in lieu of many caregiving activities    . Patient Stated     Stay as healthy and as independent as possible. Monitor my diet for diabetes closer.    . Take time for myself and travel, enjoy life and family     Be involved in church and do my bible study, reconnect with friends.       Fall Risk Fall Risk  03/19/2017 03/28/2016 03/20/2016 01/12/2015 08/22/2012  Falls in the past year? No Yes Yes Yes No  Comment - - Emmi Telephone Survey: data to providers  prior to load out in building; slipped on ramp; falling outside; tripping over hose or other -  Number falls in past yr: - 1 2 or more 2 or more -  Comment - - Emmi Telephone Survey Actual Response = 2 - -  Injury with Fall? - Yes Yes No -  Risk for fall due to : - Impaired balance/gait -  Other (Comment) -  Risk for fall due to: Comment - - - works on balance in pool -  Follow up - - - Education provided -    Depression Screen PHQ 2/9 Scores 03/19/2017 03/28/2016 04/12/2015 01/12/2015  PHQ - 2 Score 1 0 0 0  PHQ- 9 Score 3 - - -     Cognitive Function MMSE - Mini Mental State Exam 03/25/2018 03/19/2017 01/12/2015  Not completed: - - Unable to complete  Orientation to time 5 5 -  Orientation to Place 5 5 -  Registration 3 3 -  Attention/ Calculation 5 5 -  Recall 3 2 -  Language- name 2 objects 2 2 -  Language- repeat 1 1 -  Language- follow 3 step command 3 3 -  Language- read & follow direction 1 1 -  Write a sentence 1 1 -  Copy design 1 1 -  Total score 30 29 -        Immunization History  Administered Date(s) Administered  . Influenza Whole 07/24/1999, 06/13/2005, 06/17/2007, 04/14/2008, 04/11/2010  . Influenza, High Dose Seasonal PF 03/28/2016, 03/19/2017, 03/25/2018  . Influenza, Seasonal, Injecte, Preservative Fre 06/15/2014  . Influenza-Unspecified 04/22/2013, 05/02/2015  . Pneumococcal Conjugate-13 01/12/2015  . Pneumococcal Polysaccharide-23 08/03/2004  . Td 08/05/1996, 09/29/2010  . Zoster 12/21/2010   Screening Tests Health Maintenance  Topic Date Due  . INFLUENZA VACCINE  02/20/2018  . DEXA SCAN  04/26/2018  . HEMOGLOBIN A1C  06/26/2018  . FOOT EXAM  09/25/2018  . OPHTHALMOLOGY EXAM  11/30/2018  . TETANUS/TDAP  09/28/2020  . PNA vac Low Risk Adult  Completed      Plan:     Continue doing brain stimulating activities (puzzles, reading, adult coloring books, staying active) to keep memory sharp.   Continue to eat heart healthy diet (full of fruits, vegetables, whole grains, lean protein, water--limit salt, fat, and sugar intake) and increase physical activity as tolerated.  I have personally reviewed and noted the following in the patient's chart:   . Medical and social history . Use of alcohol, tobacco or illicit drugs  . Current  medications and supplements . Functional ability and status . Nutritional status . Physical activity . Advanced directives . List of other physicians . Vitals . Screenings to include cognitive, depression, and falls . Referrals and appointments  In addition, I have reviewed and discussed with patient certain preventive protocols, quality metrics, and best practice recommendations. A written personalized care plan for preventive services as well as general preventive health recommendations were provided to patient.     Michiel Cowboy, RN  03/25/2018   Medical screening examination/treatment/procedure(s) were performed by non-physician practitioner and as supervising physician I was immediately available for consultation/collaboration. I agree with above. Binnie Rail, MD

## 2018-03-25 ENCOUNTER — Ambulatory Visit (INDEPENDENT_AMBULATORY_CARE_PROVIDER_SITE_OTHER): Payer: Medicare Other | Admitting: *Deleted

## 2018-03-25 VITALS — BP 133/74 | HR 66 | Resp 19 | Ht 66.0 in | Wt 182.0 lb

## 2018-03-25 DIAGNOSIS — Z0000 Encounter for general adult medical examination without abnormal findings: Secondary | ICD-10-CM

## 2018-03-25 DIAGNOSIS — Z23 Encounter for immunization: Secondary | ICD-10-CM | POA: Diagnosis not present

## 2018-03-25 MED ORDER — GLUCOSE BLOOD VI STRP: ORAL_STRIP | 2 refills | Status: DC

## 2018-03-25 NOTE — Patient Instructions (Addendum)
Continue doing brain stimulating activities (puzzles, reading, adult coloring books, staying active) to keep memory sharp.   Continue to eat heart healthy diet (full of fruits, vegetables, whole grains, lean protein, water--limit salt, fat, and sugar intake) and increase physical activity as tolerated.   [name] , Thank you for taking time to come for your Medicare Wellness Visit. I appreciate your ongoing commitment to your health goals. Please review the following plan we discussed and let me know if I can assist you in the future.   These are the goals we discussed: Goals    . Exercise 150 minutes per week (moderate activity)     Exercise when cervical issues are better and according to Dr. Nicholes Calamity;      . make appointments with self     Will continue to take personal time in lieu of many caregiving activities    . Patient Stated     Stay as healthy and as independent as possible. Monitor my diet for diabetes closer.    . Take time for myself and travel, enjoy life and family     Be involved in church and do my bible study, reconnect with friends.       This is a list of the screening recommended for you and due dates:  Health Maintenance  Topic Date Due  . Flu Shot  02/20/2018  . DEXA scan (bone density measurement)  04/26/2018  . Hemoglobin A1C  06/26/2018  . Complete foot exam   09/25/2018  . Eye exam for diabetics  11/30/2018  . Tetanus Vaccine  09/28/2020  . Pneumonia vaccines  Completed   Health Maintenance, Female Adopting a healthy lifestyle and getting preventive care can go a long way to promote health and wellness. Talk with your health care provider about what schedule of regular examinations is right for you. This is a good chance for you to check in with your provider about disease prevention and staying healthy. In between checkups, there are plenty of things you can do on your own. Experts have done a lot of research about which lifestyle changes and preventive  measures are most likely to keep you healthy. Ask your health care provider for more information. Weight and diet Eat a healthy diet  Be sure to include plenty of vegetables, fruits, low-fat dairy products, and lean protein.  Do not eat a lot of foods high in solid fats, added sugars, or salt.  Get regular exercise. This is one of the most important things you can do for your health. ? Most adults should exercise for at least 150 minutes each week. The exercise should increase your heart rate and make you sweat (moderate-intensity exercise). ? Most adults should also do strengthening exercises at least twice a week. This is in addition to the moderate-intensity exercise.  Maintain a healthy weight  Body mass index (BMI) is a measurement that can be used to identify possible weight problems. It estimates body fat based on height and weight. Your health care provider can help determine your BMI and help you achieve or maintain a healthy weight.  For females 40 years of age and older: ? A BMI below 18.5 is considered underweight. ? A BMI of 18.5 to 24.9 is normal. ? A BMI of 25 to 29.9 is considered overweight. ? A BMI of 30 and above is considered obese.  Watch levels of cholesterol and blood lipids  You should start having your blood tested for lipids and cholesterol at 79  years of age, then have this test every 5 years.  You may need to have your cholesterol levels checked more often if: ? Your lipid or cholesterol levels are high. ? You are older than 79 years of age. ? You are at high risk for heart disease.  Cancer screening Lung Cancer  Lung cancer screening is recommended for adults 84-60 years old who are at high risk for lung cancer because of a history of smoking.  A yearly low-dose CT scan of the lungs is recommended for people who: ? Currently smoke. ? Have quit within the past 15 years. ? Have at least a 30-pack-year history of smoking. A pack year is smoking an  average of one pack of cigarettes a day for 1 year.  Yearly screening should continue until it has been 15 years since you quit.  Yearly screening should stop if you develop a health problem that would prevent you from having lung cancer treatment.  Breast Cancer  Practice breast self-awareness. This means understanding how your breasts normally appear and feel.  It also means doing regular breast self-exams. Let your health care provider know about any changes, no matter how small.  If you are in your 20s or 30s, you should have a clinical breast exam (CBE) by a health care provider every 1-3 years as part of a regular health exam.  If you are 26 or older, have a CBE every year. Also consider having a breast X-ray (mammogram) every year.  If you have a family history of breast cancer, talk to your health care provider about genetic screening.  If you are at high risk for breast cancer, talk to your health care provider about having an MRI and a mammogram every year.  Breast cancer gene (BRCA) assessment is recommended for women who have family members with BRCA-related cancers. BRCA-related cancers include: ? Breast. ? Ovarian. ? Tubal. ? Peritoneal cancers.  Results of the assessment will determine the need for genetic counseling and BRCA1 and BRCA2 testing.  Cervical Cancer Your health care provider may recommend that you be screened regularly for cancer of the pelvic organs (ovaries, uterus, and vagina). This screening involves a pelvic examination, including checking for microscopic changes to the surface of your cervix (Pap test). You may be encouraged to have this screening done every 3 years, beginning at age 27.  For women ages 73-65, health care providers may recommend pelvic exams and Pap testing every 3 years, or they may recommend the Pap and pelvic exam, combined with testing for human papilloma virus (HPV), every 5 years. Some types of HPV increase your risk of cervical  cancer. Testing for HPV may also be done on women of any age with unclear Pap test results.  Other health care providers may not recommend any screening for nonpregnant women who are considered low risk for pelvic cancer and who do not have symptoms. Ask your health care provider if a screening pelvic exam is right for you.  If you have had past treatment for cervical cancer or a condition that could lead to cancer, you need Pap tests and screening for cancer for at least 20 years after your treatment. If Pap tests have been discontinued, your risk factors (such as having a new sexual partner) need to be reassessed to determine if screening should resume. Some women have medical problems that increase the chance of getting cervical cancer. In these cases, your health care provider may recommend more frequent screening and Pap tests.  Colorectal Cancer  This type of cancer can be detected and often prevented.  Routine colorectal cancer screening usually begins at 79 years of age and continues through 80 years of age.  Your health care provider may recommend screening at an earlier age if you have risk factors for colon cancer.  Your health care provider may also recommend using home test kits to check for hidden blood in the stool.  A small camera at the end of a tube can be used to examine your colon directly (sigmoidoscopy or colonoscopy). This is done to check for the earliest forms of colorectal cancer.  Routine screening usually begins at age 50.  Direct examination of the colon should be repeated every 5-10 years through 79 years of age. However, you may need to be screened more often if early forms of precancerous polyps or small growths are found.  Skin Cancer  Check your skin from head to toe regularly.  Tell your health care provider about any new moles or changes in moles, especially if there is a change in a mole's shape or color.  Also tell your health care provider if you  have a mole that is larger than the size of a pencil eraser.  Always use sunscreen. Apply sunscreen liberally and repeatedly throughout the day.  Protect yourself by wearing long sleeves, pants, a wide-brimmed hat, and sunglasses whenever you are outside.  Heart disease, diabetes, and high blood pressure  High blood pressure causes heart disease and increases the risk of stroke. High blood pressure is more likely to develop in: ? People who have blood pressure in the high end of the normal range (130-139/85-89 mm Hg). ? People who are overweight or obese. ? People who are African American.  If you are 43-46 years of age, have your blood pressure checked every 3-5 years. If you are 66 years of age or older, have your blood pressure checked every year. You should have your blood pressure measured twice-once when you are at a hospital or clinic, and once when you are not at a hospital or clinic. Record the average of the two measurements. To check your blood pressure when you are not at a hospital or clinic, you can use: ? An automated blood pressure machine at a pharmacy. ? A home blood pressure monitor.  If you are between 54 years and 31 years old, ask your health care provider if you should take aspirin to prevent strokes.  Have regular diabetes screenings. This involves taking a blood sample to check your fasting blood sugar level. ? If you are at a normal weight and have a low risk for diabetes, have this test once every three years after 79 years of age. ? If you are overweight and have a high risk for diabetes, consider being tested at a younger age or more often. Preventing infection Hepatitis B  If you have a higher risk for hepatitis B, you should be screened for this virus. You are considered at high risk for hepatitis B if: ? You were born in a country where hepatitis B is common. Ask your health care provider which countries are considered high risk. ? Your parents were born in  a high-risk country, and you have not been immunized against hepatitis B (hepatitis B vaccine). ? You have HIV or AIDS. ? You use needles to inject street drugs. ? You live with someone who has hepatitis B. ? You have had sex with someone who has hepatitis B. ?  You get hemodialysis treatment. ? You take certain medicines for conditions, including cancer, organ transplantation, and autoimmune conditions.  Hepatitis C  Blood testing is recommended for: ? Everyone born from [birth year] through 1967. ? Anyone with known risk factors for hepatitis C.  Sexually transmitted infections (STIs)  You should be screened for sexually transmitted infections (STIs) including gonorrhea and chlamydia if: ? You are sexually active and are younger than 79 years of age. ? You are older than 79 years of age and your health care provider tells you that you are at risk for this type of infection. ? Your sexual activity has changed since you were last screened and you are at an increased risk for chlamydia or gonorrhea. Ask your health care provider if you are at risk.  If you do not have HIV, but are at risk, it may be recommended that you take a prescription medicine daily to prevent HIV infection. This is called pre-exposure prophylaxis (PrEP). You are considered at risk if: ? You are sexually active and do not regularly use condoms or know the HIV status of your partner(s). ? You take drugs by injection. ? You are sexually active with a partner who has HIV.  Talk with your health care provider about whether you are at high risk of being infected with HIV. If you choose to begin PrEP, you should first be tested for HIV. You should then be tested every 3 months for as long as you are taking PrEP. Pregnancy  If you are premenopausal and you may become pregnant, ask your health care provider about preconception counseling.  If you may become pregnant, take 400 to 800 micrograms (mcg) of folic acid every day.  If  you want to prevent pregnancy, talk to your health care provider about birth control (contraception). Osteoporosis and menopause  Osteoporosis is a disease in which the bones lose minerals and strength with aging. This can result in serious bone fractures. Your risk for osteoporosis can be identified using a bone density scan.  If you are 4 years of age or older, or if you are at risk for osteoporosis and fractures, ask your health care provider if you should be screened.  Ask your health care provider whether you should take a calcium or vitamin D supplement to lower your risk for osteoporosis.  Menopause may have certain physical symptoms and risks.  Hormone replacement therapy may reduce some of these symptoms and risks. Talk to your health care provider about whether hormone replacement therapy is right for you. Follow these instructions at home:  Schedule regular health, dental, and eye exams.  Stay current with your immunizations.  Do not use any tobacco products including cigarettes, chewing tobacco, or electronic cigarettes.  If you are pregnant, do not drink alcohol.  If you are breastfeeding, limit how much and how often you drink alcohol.  Limit alcohol intake to no more than 1 drink per day for nonpregnant women. One drink equals 12 ounces of beer, 5 ounces of wine, or 1 ounces of hard liquor.  Do not use street drugs.  Do not share needles.  Ask your health care provider for help if you need support or information about quitting drugs.  Tell your health care provider if you often feel depressed.  Tell your health care provider if you have ever been abused or do not feel safe at home. This information is not intended to replace advice given to you by your health care provider. Make sure you  discuss any questions you have with your health care provider. Document Released: 01/22/2011 Document Revised: 12/15/2015 Document Reviewed: 04/12/2015 Elsevier Interactive  Patient Education  [employer].

## 2018-04-04 ENCOUNTER — Encounter (INDEPENDENT_AMBULATORY_CARE_PROVIDER_SITE_OTHER): Payer: Self-pay | Admitting: Orthopaedic Surgery

## 2018-04-04 ENCOUNTER — Ambulatory Visit (INDEPENDENT_AMBULATORY_CARE_PROVIDER_SITE_OTHER): Payer: Medicare Other | Admitting: Orthopaedic Surgery

## 2018-04-04 VITALS — BP 130/65 | HR 65 | Ht 66.0 in | Wt 180.0 lb

## 2018-04-04 DIAGNOSIS — M65332 Trigger finger, left middle finger: Secondary | ICD-10-CM | POA: Diagnosis not present

## 2018-04-04 DIAGNOSIS — I6523 Occlusion and stenosis of bilateral carotid arteries: Secondary | ICD-10-CM

## 2018-04-04 NOTE — Progress Notes (Signed)
Office Visit Note   Patient: [name]           Date of Birth: 10-May-1941           MRN: [id number] Visit Date: 04/04/2018              Requested by: Binnie Rail, MD [place], [place] [address] PCP: Binnie Rail, MD   Assessment & Plan: Visit Diagnoses:  1. Trigger finger, left middle finger     Plan: Left long trigger finger.  Long discussion regarding treatment options.  [name] would like to proceed with surgical release.  We will schedule at her convenience  Follow-Up Instructions: Return will schewdule surgery.   Orders:  No orders of the defined types were placed in this encounter.  No orders of the defined types were placed in this encounter.     Procedures: No procedures performed   Clinical Data: No additional findings.   Subjective: Chief Complaint  Patient presents with  . Follow-up    L MIDDLE TRIGGER FINGER FOR ALMOST 1 YR GETTING WORSE, HAVING PAIN AND UNABLE TO CLOSE HAND ALL THE WAY AND FINGER GETS STUCK    HPI  Review of Systems  Constitutional: Negative for fatigue and fever.  HENT: Negative for ear pain.   Eyes: Negative for pain.  Respiratory: Negative for cough and shortness of breath.   Cardiovascular: Negative for leg swelling.  Gastrointestinal: Negative for constipation and diarrhea.  Genitourinary: Negative for difficulty urinating.  Musculoskeletal: Negative for back pain and neck pain.  Skin: Negative for rash.  Allergic/Immunologic: Negative for food allergies.  Neurological: Negative for weakness and numbness.  Hematological: Bruises/bleeds easily.  Psychiatric/Behavioral: Negative for sleep disturbance.     Objective: Vital Signs: BP 130/65 (BP Location: Left Arm, Patient Position: Sitting, Cuff Size: Normal)   Pulse 65   Ht 5\' 6"  (1.676 m)   Wt 180 lb (81.6 kg)   BMI 29.05 kg/m   Physical Exam  Constitutional: She is oriented to person, place, and time. She appears well-developed and  well-nourished.  HENT:  Mouth/Throat: Oropharynx is clear and moist.  Eyes: Pupils are equal, round, and reactive to light. EOM are normal.  Pulmonary/Chest: Effort normal.  Neurological: She is alert and oriented to person, place, and time.  Skin: Skin is warm and dry.  Psychiatric: She has a normal mood and affect. Her behavior is normal.    Ortho Exam awake alert and oriented x3.  Comfortable sitting.  Examination of the left hand reveals incomplete flexion of the long fingers.  She lacked about a fingerbreadth and a half 2 fingerbreadths to touch the tip of the finger to the palm of her hand.  She had a painful nodule over the A1 pulley consistent with trigger finger.  Neurovascular exam intact.  No swelling of the digit  Specialty Comments:  No specialty comments available.  Imaging: No results found.   PMFS History: Patient Active Problem List   Diagnosis Date Noted  . Hypothyroidism 03/29/2017  . Incontinence of feces 09/25/2016  . Diarrhea 09/25/2016  . Bilateral carotid artery stenosis 09/25/2016  . Generalized anxiety disorder 06/20/2015  . SUI (stress urinary incontinence, female) 02/05/2014  . 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 Cerebral artery occlusion with cerebral infarction ([place]) 05/31/2010  . DM (diabetes mellitus), secondary, uncontrolled, with renal complications ([place]) 44/09/4740  . THYROID NODULE, RIGHT 01/21/2009  . Vitamin D deficiency 12/10/2007  . HYPERLIPIDEMIA 06/17/2007  . Essential hypertension  06/17/2007  . Osteopenia 06/17/2007  . Osteoarthrosis, unspecified whether generalized or localized, unspecified site 11/11/2006   Past Medical History:  Diagnosis Date  . Anxiety   . Arthritis HANDS, NECK , BACK  . Asymptomatic carotid artery stenosis LEFT --  MOD. PER DR WILLIS NOTE OCT 2012  . Benign heart murmur   . Depression    PMH of  . Diabetes mellitus ORAL MED  . DVT (deep venous thrombosis) ([place]) 2006   after arthroscopy  . Frequency  of urination   . History of CVA (cerebrovascular accident) NEUROLOGIST  DR WILLIS----  06-01-2010-  LEFT BASAL GANGLIA HEMORRAGE   RESIDUAL RIGHT HAND NUMBNESS/TINGLING  . Hyperlipidemia   . Hypertension   . Hypothyroid   . MRSA (methicillin resistant staph aureus) culture positive    X3; last post TKR  . Nocturia   . Numbness and tingling in right hand RESIDUAL FROM CVA NOV 2011  . Obesity    Redux therapy  . Osteoporosis   . Thyroid disease    right thyroid nodule-- BX DONE 08-15-2011    Family History  Problem Relation Age of Onset  . Bladder Cancer Mother   . Diabetes Mother   . Brain cancer Sister   . Stroke Paternal Grandfather        in 1s  . Diabetes Sister 37       TYPE 1   . Prostate cancer Brother   . Alzheimer's disease Brother   . Diabetes Maternal Aunt   . Diabetes Maternal Uncle   . Diabetes Maternal Grandmother        Type 2  . Diabetes Maternal Grandfather        Type 2  . Pancreatic cancer Sister   . Kidney failure Sister        in context of DM & influenza  . Heart disease Neg Hx     Past Surgical History:  Procedure Laterality Date  . ABDOMINAL HYSTERECTOMY  1973   Endometriosis & fibroid  . CARPAL TUNNEL RELEASE  2000   RIGHT  . COLONOSCOPY  1999 & 2005   Dr Olevia Perches  . cystoscope     to evaluate recurrent UTIs  . CYSTOSCOPY W/ RETROGRADES  08/22/2011   Procedure: CYSTOSCOPY WITH RETROGRADE PYELOGRAM;  Surgeon: Molli Hazard, MD;  Location: [facility];  Service: Urology;  Laterality: Bilateral;  . CYSTOSCOPY WITH BIOPSY  08/22/2011   Procedure: CYSTOSCOPY WITH BIOPSY;  Surgeon: Molli Hazard, MD;  Location: [facility];  Service: Urology;  Laterality: N/A;  . g2 p2    . JOINT REPLACEMENT  03-28-2006   LEFT THUMB  . KNEE ARTHROSCOPY     BILATERAL PRIOR TO TOTAL KNEE  . LEFT SHOULDER SURG  1990'S  . LUMBAR LAMINECTOMY  08-26-2009   L 4 - 5  . THYROID NODULE BX  08-15-2011   RIGHT  . TOTAL KNEE  ARTHROPLASTY  05-26-2002      LEFT   AND RIGHT  03-30-2098  . WRIST SURGERY  2014   tendon placed in joint; Dr Burney Gauze   Social History   Occupational History  . Not on file  Tobacco Use  . Smoking status: Never Smoker  . Smokeless tobacco: Never Used  Substance and Sexual Activity  . Alcohol use: Yes    Comment:  very rarely , < 1 glass wine / month  . Drug use: No  . Sexual activity: Not Currently

## 2018-04-21 ENCOUNTER — Other Ambulatory Visit: Payer: Self-pay | Admitting: Internal Medicine

## 2018-04-24 DIAGNOSIS — M65332 Trigger finger, left middle finger: Secondary | ICD-10-CM | POA: Diagnosis not present

## 2018-04-28 ENCOUNTER — Encounter (INDEPENDENT_AMBULATORY_CARE_PROVIDER_SITE_OTHER): Payer: Self-pay | Admitting: Orthopaedic Surgery

## 2018-04-28 ENCOUNTER — Ambulatory Visit (INDEPENDENT_AMBULATORY_CARE_PROVIDER_SITE_OTHER): Payer: Medicare Other | Admitting: Orthopaedic Surgery

## 2018-04-28 ENCOUNTER — Encounter (INDEPENDENT_AMBULATORY_CARE_PROVIDER_SITE_OTHER): Payer: Self-pay

## 2018-04-28 VITALS — BP 139/64 | HR 64 | Ht 66.0 in | Wt 180.0 lb

## 2018-04-28 DIAGNOSIS — M65332 Trigger finger, left middle finger: Secondary | ICD-10-CM | POA: Insufficient documentation

## 2018-04-28 NOTE — Progress Notes (Signed)
Office Visit Note   Patient: [name]           Date of Birth: 13-Dec-1940           MRN: [id number] Visit Date: 04/28/2018              Requested by: Binnie Rail, MD [place], [place] [id number] PCP: Binnie Rail, MD   Assessment & Plan: Visit Diagnoses:  1. Trigger finger, left middle finger     Plan: 4 days status post release of left long trigger finger and doing well.  Wound is healing nicely without problems.  Neurologically intact.  Will apply waterproof Band-Aid, urged range of motion exercises and return in 1 week for stitch removal  Follow-Up Instructions: Return in about 1 week (around 05/05/2018).   Orders:  No orders of the defined types were placed in this encounter.  No orders of the defined types were placed in this encounter.     Procedures: No procedures performed   Clinical Data: No additional findings.   Subjective: Chief Complaint  Patient presents with  . Follow-up    04/24/18  l trigger finger release, has some swelling no numbness     HPI  Review of Systems  Constitutional: Negative for fatigue and fever.  HENT: Negative for ear pain.   Eyes: Negative for pain.  Respiratory: Negative for cough and shortness of breath.   Cardiovascular: Negative for leg swelling.  Gastrointestinal: Negative for constipation and diarrhea.  Genitourinary: Negative for difficulty urinating.  Musculoskeletal: Negative for back pain and neck pain.  Skin: Negative for rash.  Allergic/Immunologic: Negative for food allergies.  Neurological: Positive for weakness. Negative for numbness.  Hematological: Does not bruise/bleed easily.  Psychiatric/Behavioral: Negative for sleep disturbance.     Objective: Vital Signs: Ht 5\' 6"  (1.676 m)   Wt 180 lb (81.6 kg)   BMI 29.05 kg/m   Physical Exam  Ortho Exam left long finger trigger incision healing without problem.  Cleaned and Band-Aid applied.  Neurologically intact.  Has some  degenerative changes across the PIP and DIP joints of many of her fingers which might limit her motion.  She lacks about a fingerbreadth of touching the tip of the finger to the palm of her hand  Specialty Comments:  No specialty comments available.  Imaging: No results found.   PMFS History: Patient Active Problem List   Diagnosis Date Noted  . Trigger finger, left middle finger 04/28/2018  . Hypothyroidism 03/29/2017  . Incontinence of feces 09/25/2016  . Diarrhea 09/25/2016  . Bilateral carotid artery stenosis 09/25/2016  . Generalized anxiety disorder 06/20/2015  . SUI (stress urinary incontinence, female) 02/05/2014  . 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 Cerebral artery occlusion with cerebral infarction ([place]) 05/31/2010  . DM (diabetes mellitus), secondary, uncontrolled, with renal complications ([place]) 81/44/8185  . THYROID NODULE, RIGHT 01/21/2009  . Vitamin D deficiency 12/10/2007  . HYPERLIPIDEMIA 06/17/2007  . Essential hypertension 06/17/2007  . Osteopenia 06/17/2007  . Osteoarthrosis, unspecified whether generalized or localized, unspecified site 11/11/2006   Past Medical History:  Diagnosis Date  . Anxiety   . Arthritis HANDS, NECK , BACK  . Asymptomatic carotid artery stenosis LEFT --  MOD. PER DR WILLIS NOTE OCT 2012  . Benign heart murmur   . Depression    PMH of  . Diabetes mellitus ORAL MED  . DVT (deep venous thrombosis) ([place]) 2006   after arthroscopy  . Frequency of urination   .  History of CVA (cerebrovascular accident) NEUROLOGIST  DR WILLIS----  06-01-2010-  LEFT BASAL GANGLIA HEMORRAGE   RESIDUAL RIGHT HAND NUMBNESS/TINGLING  . Hyperlipidemia   . Hypertension   . Hypothyroid   . MRSA (methicillin resistant staph aureus) culture positive    X3; last post TKR  . Nocturia   . Numbness and tingling in right hand RESIDUAL FROM CVA NOV 2011  . Obesity    Redux therapy  . Osteoporosis   . Thyroid disease    right thyroid nodule-- BX DONE  08-15-2011    Family History  Problem Relation Age of Onset  . Bladder Cancer Mother   . Diabetes Mother   . Brain cancer Sister   . Stroke Paternal Grandfather        in 63s  . Diabetes Sister 62       TYPE 1   . Prostate cancer Brother   . Alzheimer's disease Brother   . Diabetes Maternal Aunt   . Diabetes Maternal Uncle   . Diabetes Maternal Grandmother        Type 2  . Diabetes Maternal Grandfather        Type 2  . Pancreatic cancer Sister   . Kidney failure Sister        in context of DM & influenza  . Heart disease Neg Hx     Past Surgical History:  Procedure Laterality Date  . ABDOMINAL HYSTERECTOMY  1973   Endometriosis & fibroid  . CARPAL TUNNEL RELEASE  2000   RIGHT  . COLONOSCOPY  1999 & 2005   Dr Olevia Perches  . cystoscope     to evaluate recurrent UTIs  . CYSTOSCOPY W/ RETROGRADES  08/22/2011   Procedure: CYSTOSCOPY WITH RETROGRADE PYELOGRAM;  Surgeon: Molli Hazard, MD;  Location: [facility];  Service: Urology;  Laterality: Bilateral;  . CYSTOSCOPY WITH BIOPSY  08/22/2011   Procedure: CYSTOSCOPY WITH BIOPSY;  Surgeon: Molli Hazard, MD;  Location[facility];  Service: Urology;  Laterality: N/A;  . g2 p2    . JOINT REPLACEMENT  03-28-2006   LEFT THUMB  . KNEE ARTHROSCOPY     BILATERAL PRIOR TO TOTAL KNEE  . LEFT SHOULDER SURG  1990'S  . LUMBAR LAMINECTOMY  08-26-2009   L 4 - 5  . THYROID NODULE BX  08-15-2011   RIGHT  . TOTAL KNEE ARTHROPLASTY  05-26-2002      LEFT   AND RIGHT  03-30-2098  . WRIST SURGERY  2014   tendon placed in joint; Dr Burney Gauze   Social History   Occupational History  . Not on file  Tobacco Use  . Smoking status: Never Smoker  . Smokeless tobacco: Never Used  Substance and Sexual Activity  . Alcohol use: Yes    Comment:  very rarely , < 1 glass wine / month  . Drug use: No  . Sexual activity: Not Currently

## 2018-05-06 ENCOUNTER — Ambulatory Visit (INDEPENDENT_AMBULATORY_CARE_PROVIDER_SITE_OTHER): Payer: Medicare Other | Admitting: Orthopaedic Surgery

## 2018-05-06 ENCOUNTER — Encounter (INDEPENDENT_AMBULATORY_CARE_PROVIDER_SITE_OTHER): Payer: Self-pay | Admitting: Orthopaedic Surgery

## 2018-05-06 VITALS — BP 140/65 | HR 73 | Ht 66.0 in | Wt 180.0 lb

## 2018-05-06 DIAGNOSIS — M65332 Trigger finger, left middle finger: Secondary | ICD-10-CM

## 2018-05-06 NOTE — Progress Notes (Signed)
Office Visit Note   Patient: [name]           Date of Birth: 1940/12/27           MRN: [id number] Visit Date: 05/06/2018              Requested by: Binnie Rail, MD [place], [place] [id number] PCP: Binnie Rail, MD   Assessment & Plan: Visit Diagnoses:  1. Trigger finger, left middle finger     Plan: 11 days status post release of left long trigger finger and doing well.  Remove stitches.  We will see back as needed  Follow-Up Instructions: Return if symptoms worsen or fail to improve.   Orders:  No orders of the defined types were placed in this encounter.  No orders of the defined types were placed in this encounter.     Procedures: No procedures performed   Clinical Data: No additional findings.   Subjective: Chief Complaint  Patient presents with  . Follow-up    1 WK F/U REMOVE STITCHES, DOING GOOD NO SWELLING, NUMBNESS OR TINGLING    HPI  Review of Systems   Objective: Vital Signs: BP 140/65 (BP Location: Right Arm, Patient Position: Sitting, Cuff Size: Normal)   Pulse 73   Ht 5\' 6"  (1.676 m)   Wt 180 lb (81.6 kg)   BMI 29.05 kg/m   Physical Exam  Ortho Exam left long trigger finger incision healing without problem.  Stitches removed.  Neurovascular exam intact.  Has considerable degenerative changes in her fingers.  She is now able to touch the tip of her finger to the palm of her hand but does have PIP and DIP joint changes throughout her left hand.  No longer has triggering of the left long finger  Specialty Comments:  No specialty comments available.  Imaging: No results found.   PMFS History: Patient Active Problem List   Diagnosis Date Noted  . Trigger finger, left middle finger 04/28/2018  . Hypothyroidism 03/29/2017  . Incontinence of feces 09/25/2016  . Diarrhea 09/25/2016  . Bilateral carotid artery stenosis 09/25/2016  . Generalized anxiety disorder 06/20/2015  . SUI (stress urinary incontinence, female)  02/05/2014  . 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 Cerebral artery occlusion with cerebral infarction ([place]) 05/31/2010  . DM (diabetes mellitus), secondary, uncontrolled, with renal complications ([place]) 23/30/0762  . THYROID NODULE, RIGHT 01/21/2009  . Vitamin D deficiency 12/10/2007  . HYPERLIPIDEMIA 06/17/2007  . Essential hypertension 06/17/2007  . Osteopenia 06/17/2007  . Osteoarthrosis, unspecified whether generalized or localized, unspecified site 11/11/2006   Past Medical History:  Diagnosis Date  . Anxiety   . Arthritis HANDS, NECK , BACK  . Asymptomatic carotid artery stenosis LEFT --  MOD. PER DR WILLIS NOTE OCT 2012  . Benign heart murmur   . Depression    PMH of  . Diabetes mellitus ORAL MED  . DVT (deep venous thrombosis) ([place]) 2006   after arthroscopy  . Frequency of urination   . History of CVA (cerebrovascular accident) NEUROLOGIST  DR WILLIS----  06-01-2010-  LEFT BASAL GANGLIA HEMORRAGE   RESIDUAL RIGHT HAND NUMBNESS/TINGLING  . Hyperlipidemia   . Hypertension   . Hypothyroid   . MRSA (methicillin resistant staph aureus) culture positive    X3; last post TKR  . Nocturia   . Numbness and tingling in right hand RESIDUAL FROM CVA NOV 2011  . Obesity    Redux therapy  . Osteoporosis   .  Thyroid disease    right thyroid nodule-- BX DONE 08-15-2011    Family History  Problem Relation Age of Onset  . Bladder Cancer Mother   . Diabetes Mother   . Brain cancer Sister   . Stroke Paternal Grandfather        in 54s  . Diabetes Sister 64       TYPE 1   . Prostate cancer Brother   . Alzheimer's disease Brother   . Diabetes Maternal Aunt   . Diabetes Maternal Uncle   . Diabetes Maternal Grandmother        Type 2  . Diabetes Maternal Grandfather        Type 2  . Pancreatic cancer Sister   . Kidney failure Sister        in context of DM & influenza  . Heart disease Neg Hx     Past Surgical History:  Procedure Laterality Date  . ABDOMINAL HYSTERECTOMY   1973   Endometriosis & fibroid  . CARPAL TUNNEL RELEASE  2000   RIGHT  . COLONOSCOPY  1999 & 2005   Dr Olevia Perches  . cystoscope     to evaluate recurrent UTIs  . CYSTOSCOPY W/ RETROGRADES  08/22/2011   Procedure: CYSTOSCOPY WITH RETROGRADE PYELOGRAM;  Surgeon: Molli Hazard, MD;  Location: [facility];  Service: Urology;  Laterality: Bilateral;  . CYSTOSCOPY WITH BIOPSY  08/22/2011   Procedure: CYSTOSCOPY WITH BIOPSY;  Surgeon: Molli Hazard, MD;  Location: [facility];  Service: Urology;  Laterality: N/A;  . g2 p2    . JOINT REPLACEMENT  03-28-2006   LEFT THUMB  . KNEE ARTHROSCOPY     BILATERAL PRIOR TO TOTAL KNEE  . LEFT SHOULDER SURG  1990'S  . LUMBAR LAMINECTOMY  08-26-2009   L 4 - 5  . THYROID NODULE BX  08-15-2011   RIGHT  . TOTAL KNEE ARTHROPLASTY  05-26-2002      LEFT   AND RIGHT  03-30-2098  . WRIST SURGERY  2014   tendon placed in joint; Dr Burney Gauze   Social History   Occupational History  . Not on file  Tobacco Use  . Smoking status: Never Smoker  . Smokeless tobacco: Never Used  Substance and Sexual Activity  . Alcohol use: Yes    Comment:  very rarely , < 1 glass wine / month  . Drug use: No  . Sexual activity: Not Currently     Garald Balding, MD   Note - This record has been created using [employer].  Chart creation errors have been sought, but may not always  have been located. Such creation errors do not reflect on  the standard of medical care.

## 2018-05-06 NOTE — Progress Notes (Deleted)
Office Visit Note   Patient: [name]           Date of Birth: 1940-11-17           MRN: [id number] Visit Date: 05/06/2018              Requested by: Binnie Rail, MD [place], [place] [id number] PCP: Binnie Rail, MD   Assessment & Plan: Visit Diagnoses: No diagnosis found.  Plan: ***  Follow-Up Instructions: No follow-ups on file.   Orders:  No orders of the defined types were placed in this encounter.  No orders of the defined types were placed in this encounter.     Procedures: No procedures performed   Clinical Data: No additional findings.   Subjective: Chief Complaint  Patient presents with  . Follow-up    1 WK F/U REMOVE STITCHES, DOING GOOD NO SWELLING, NUMBNESS OR TINGLING    HPI  Review of Systems  Constitutional: Negative for fatigue and fever.  HENT: Negative for ear pain.   Eyes: Negative for pain.  Respiratory: Negative for cough and shortness of breath.   Cardiovascular: Negative for leg swelling.  Gastrointestinal: Negative for constipation and diarrhea.  Genitourinary: Negative for difficulty urinating.  Musculoskeletal: Negative for back pain and neck pain.  Skin: Negative for rash.  Allergic/Immunologic: Negative for food allergies.  Neurological: Positive for weakness. Negative for numbness.  Hematological: Does not bruise/bleed easily.  Psychiatric/Behavioral: Negative for sleep disturbance.     Objective: Vital Signs: There were no vitals taken for this visit.  Physical Exam  Ortho Exam  Specialty Comments:  No specialty comments available.  Imaging: No results found.   PMFS History: Patient Active Problem List   Diagnosis Date Noted  . Trigger finger, left middle finger 04/28/2018  . Hypothyroidism 03/29/2017  . Incontinence of feces 09/25/2016  . Diarrhea 09/25/2016  . Bilateral carotid artery stenosis 09/25/2016  . Generalized anxiety disorder 06/20/2015  . SUI (stress urinary incontinence,  female) 02/05/2014  . 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 Cerebral artery occlusion with cerebral infarction ([place]) 05/31/2010  . DM (diabetes mellitus), secondary, uncontrolled, with renal complications ([place]) 61/60/7371  . THYROID NODULE, RIGHT 01/21/2009  . Vitamin D deficiency 12/10/2007  . HYPERLIPIDEMIA 06/17/2007  . Essential hypertension 06/17/2007  . Osteopenia 06/17/2007  . Osteoarthrosis, unspecified whether generalized or localized, unspecified site 11/11/2006   Past Medical History:  Diagnosis Date  . Anxiety   . Arthritis HANDS, NECK , BACK  . Asymptomatic carotid artery stenosis LEFT --  MOD. PER DR WILLIS NOTE OCT 2012  . Benign heart murmur   . Depression    PMH of  . Diabetes mellitus ORAL MED  . DVT (deep venous thrombosis) ([place]) 2006   after arthroscopy  . Frequency of urination   . History of CVA (cerebrovascular accident) NEUROLOGIST  DR WILLIS----  06-01-2010-  LEFT BASAL GANGLIA HEMORRAGE   RESIDUAL RIGHT HAND NUMBNESS/TINGLING  . Hyperlipidemia   . Hypertension   . Hypothyroid   . MRSA (methicillin resistant staph aureus) culture positive    X3; last post TKR  . Nocturia   . Numbness and tingling in right hand RESIDUAL FROM CVA NOV 2011  . Obesity    Redux therapy  . Osteoporosis   . Thyroid disease    right thyroid nodule-- BX DONE 08-15-2011    Family History  Problem Relation Age of Onset  . Bladder Cancer Mother   . Diabetes Mother   .  Brain cancer Sister   . Stroke Paternal Grandfather        in 54s  . Diabetes Sister 37       TYPE 1   . Prostate cancer Brother   . Alzheimer's disease Brother   . Diabetes Maternal Aunt   . Diabetes Maternal Uncle   . Diabetes Maternal Grandmother        Type 2  . Diabetes Maternal Grandfather        Type 2  . Pancreatic cancer Sister   . Kidney failure Sister        in context of DM & influenza  . Heart disease Neg Hx     Past Surgical History:  Procedure Laterality Date  . ABDOMINAL  HYSTERECTOMY  1973   Endometriosis & fibroid  . CARPAL TUNNEL RELEASE  2000   RIGHT  . COLONOSCOPY  1999 & 2005   Dr Olevia Perches  . cystoscope     to evaluate recurrent UTIs  . CYSTOSCOPY W/ RETROGRADES  08/22/2011   Procedure: CYSTOSCOPY WITH RETROGRADE PYELOGRAM;  Surgeon: Molli Hazard, MD;  Location: [facility];  Service: Urology;  Laterality: Bilateral;  . CYSTOSCOPY WITH BIOPSY  08/22/2011   Procedure: CYSTOSCOPY WITH BIOPSY;  Surgeon: Molli Hazard, MD;  [facility];  Service: Urology;  Laterality: N/A;  . g2 p2    . JOINT REPLACEMENT  03-28-2006   LEFT THUMB  . KNEE ARTHROSCOPY     BILATERAL PRIOR TO TOTAL KNEE  . LEFT SHOULDER SURG  1990'S  . LUMBAR LAMINECTOMY  08-26-2009   L 4 - 5  . THYROID NODULE BX  08-15-2011   RIGHT  . TOTAL KNEE ARTHROPLASTY  05-26-2002      LEFT   AND RIGHT  03-30-2098  . WRIST SURGERY  2014   tendon placed in joint; Dr Burney Gauze   Social History   Occupational History  . Not on file  Tobacco Use  . Smoking status: Never Smoker  . Smokeless tobacco: Never Used  Substance and Sexual Activity  . Alcohol use: Yes    Comment:  very rarely , < 1 glass wine / month  . Drug use: No  . Sexual activity: Not Currently

## 2018-05-20 ENCOUNTER — Other Ambulatory Visit: Payer: Self-pay | Admitting: Internal Medicine

## 2018-05-31 ENCOUNTER — Other Ambulatory Visit: Payer: Self-pay | Admitting: Internal Medicine

## 2018-06-04 ENCOUNTER — Other Ambulatory Visit: Payer: Self-pay | Admitting: Internal Medicine

## 2018-06-11 ENCOUNTER — Other Ambulatory Visit: Payer: Self-pay | Admitting: Internal Medicine

## 2018-06-29 NOTE — Progress Notes (Signed)
Subjective:    Patient ID: [name], female    DOB: [birth year], 77 y.o.   MRN: [id number]  HPI The patient is here for follow up.  Diabetes: She is taking her medication daily as prescribed. She is compliant with a diabetic diet. She is exercising regularly - water exercise 3/week. She monitors her sugars and they have been running 165-219. She checks her feet daily and denies foot lesions. She is up-to-date with an ophthalmology examination.   Hypertension: She is taking her medication daily. She is compliant with a low sodium diet.  She denies chest pain, palpitations, edema, shortness of breath and regular headaches. She is exercising regularly.       Hypothyroidism:  She is taking her medication daily.  She denies any recent changes in energy or weight that are unexplained.   Hyperlipidemia: She is taking her medication daily. She is compliant with a low fat/cholesterol diet. She is exercising regularly. She denies myalgias.     Medications and allergies reviewed with patient and updated if appropriate.  Patient Active Problem List   Diagnosis Date Noted  . Trigger finger, left middle finger 04/28/2018  . Hypothyroidism 03/29/2017  . Incontinence of feces 09/25/2016  . Diarrhea 09/25/2016  . Bilateral carotid artery stenosis 09/25/2016  . Generalized anxiety disorder 06/20/2015  . SUI (stress urinary incontinence, female) 02/05/2014  . 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 Cerebral artery occlusion with cerebral infarction ([place]) 05/31/2010  . DM (diabetes mellitus), secondary, uncontrolled, with renal complications ([place]) 65/68/1275  . THYROID NODULE, RIGHT 01/21/2009  . Vitamin D deficiency 12/10/2007  . HYPERLIPIDEMIA 06/17/2007  . Essential hypertension 06/17/2007  . Osteopenia 06/17/2007  . Osteoarthrosis, unspecified whether generalized or localized, unspecified site 11/11/2006    Current Outpatient Medications on File Prior to Visit  Medication Sig Dispense  Refill  . aspirin 81 MG tablet Take 81 mg by mouth at bedtime.     . benazepril (LOTENSIN) 20 MG tablet TAKE 1 TABLET DAILY 90 tablet 0  . Blood Glucose Monitoring Suppl (FREESTYLE LITE) DEVI Use meter daily to check blood sugar as instructed. 1 each 3  . calcium citrate-vitamin D 500-400 MG-UNIT chewable tablet Chew 1 tablet by mouth at bedtime.     . Cholecalciferol (VITAMIN D3) 1000 UNITS CAPS Take 1 capsule by mouth daily.    . clonazePAM (KLONOPIN) 0.5 MG tablet TAKE ONE TABLET BY MOUTH ONCE DAILY AS NEEDED FOR ANXIETY 90 tablet 1  . CRANBERRY PO Take 300 mg by mouth daily.    . cycloSPORINE (RESTASIS) 0.05 % ophthalmic emulsion Place 1 drop into both eyes 2 (two) times daily.    Marland Kitchen escitalopram (LEXAPRO) 10 MG tablet TAKE 1 TABLET DAILY (DISCONTINUE CELEXA) 90 tablet 1  . glimepiride (AMARYL) 2 MG tablet TAKE 1 TABLET TWICE A DAY 180 tablet 1  . glucose blood (FREESTYLE LITE) test strip USE TO CHECK BLOOD SUGAR DAILY AS DIRECTED 100 each 2  . Lancets (FREESTYLE) lancets Use as instructed 100 each 2  . metFORMIN (GLUCOPHAGE-XR) 500 MG 24 hr tablet TAKE 1 TABLET BY MOUTH ONCE DAILY WITH BREAKFAST 90 tablet 1  . methylcellulose (ARTIFICIAL TEARS) 1 % ophthalmic solution Place 1 drop into both eyes daily as needed (itching eyes).     . Multiple Vitamins-Calcium (ONE-A-DAY WOMENS FORMULA) TABS Take 1 tablet by mouth daily.    . pravastatin (PRAVACHOL) 40 MG tablet TAKE 1 TABLET AT BEDTIME 90 tablet 1  . sitaGLIPtin (JANUVIA) 100 MG tablet Take  1 tablet (100 mg total) by mouth daily. 90 tablet 3  . SYNTHROID 25 MCG tablet TAKE 1 TABLET DAILY BEFORE BREAKFAST 90 tablet 0   No current facility-administered medications on file prior to visit.     Past Medical History:  Diagnosis Date  . Anxiety   . Arthritis HANDS, NECK , BACK  . Asymptomatic carotid artery stenosis LEFT --  MOD. PER DR WILLIS NOTE OCT 2012  . Benign heart murmur   . Depression    PMH of  . Diabetes mellitus ORAL MED  .  DVT (deep venous thrombosis) ([place]) 2006   after arthroscopy  . Frequency of urination   . History of CVA (cerebrovascular accident) NEUROLOGIST  DR WILLIS----  06-01-2010-  LEFT BASAL GANGLIA HEMORRAGE   RESIDUAL RIGHT HAND NUMBNESS/TINGLING  . Hyperlipidemia   . Hypertension   . Hypothyroid   . MRSA (methicillin resistant staph aureus) culture positive    X3; last post TKR  . Nocturia   . Numbness and tingling in right hand RESIDUAL FROM CVA NOV 2011  . Obesity    Redux therapy  . Osteoporosis   . Thyroid disease    right thyroid nodule-- BX DONE 08-15-2011    Past Surgical History:  Procedure Laterality Date  . ABDOMINAL HYSTERECTOMY  1973   Endometriosis & fibroid  . CARPAL TUNNEL RELEASE  2000   RIGHT  . COLONOSCOPY  1999 & 2005   Dr Olevia Perches  . cystoscope     to evaluate recurrent UTIs  . CYSTOSCOPY W/ RETROGRADES  08/22/2011   Procedure: CYSTOSCOPY WITH RETROGRADE PYELOGRAM;  Surgeon: Molli Hazard, MD;  Location: [facility];  Service: Urology;  Laterality: Bilateral;  . CYSTOSCOPY WITH BIOPSY  08/22/2011   Procedure: CYSTOSCOPY WITH BIOPSY;  Surgeon: Molli Hazard, MD;  Location: [facility];  Service: Urology;  Laterality: N/A;  . g2 p2    . JOINT REPLACEMENT  03-28-2006   LEFT THUMB  . KNEE ARTHROSCOPY     BILATERAL PRIOR TO TOTAL KNEE  . LEFT SHOULDER SURG  1990'S  . LUMBAR LAMINECTOMY  08-26-2009   L 4 - 5  . THYROID NODULE BX  08-15-2011   RIGHT  . TOTAL KNEE ARTHROPLASTY  05-26-2002      LEFT   AND RIGHT  03-30-2098  . WRIST SURGERY  2014   tendon placed in joint; Dr Burney Gauze    Social History   Socioeconomic History  . Marital status: Widowed    Spouse name: Not on file  . Number of children: 2  . Years of education: Not on file  . Highest education level: Not on file  Occupational History  . Not on file  Social Needs  . Financial resource strain: Not hard at all  . Food insecurity:    Worry: Never  true    Inability: Never true  . Transportation needs:    Medical: No    Non-medical: No  Tobacco Use  . Smoking status: Never Smoker  . Smokeless tobacco: Never Used  Substance and Sexual Activity  . Alcohol use: Yes    Comment:  very rarely , < 1 glass wine / month  . Drug use: No  . Sexual activity: Not Currently  Lifestyle  . Physical activity:    Days per week: 5 days    Minutes per session: 50 min  . Stress: To some extent  Relationships  . Social connections:    Talks on phone: More  than three times a week    Gets together: More than three times a week    Attends religious service: More than 4 times per year    Active member of club or organization: Yes    Attends meetings of clubs or organizations: More than 4 times per year    Relationship status: Widowed  Other Topics Concern  . Not on file  Social History Narrative  . Not on file    Family History  Problem Relation Age of Onset  . Bladder Cancer Mother   . Diabetes Mother   . Brain cancer Sister   . Stroke Paternal Grandfather        in 70s  . Diabetes Sister 75       TYPE 1   . Prostate cancer Brother   . Alzheimer's disease Brother   . Diabetes Maternal Aunt   . Diabetes Maternal Uncle   . Diabetes Maternal Grandmother        Type 2  . Diabetes Maternal Grandfather        Type 2  . Pancreatic cancer Sister   . Kidney failure Sister        in context of DM & influenza  . Heart disease Neg Hx     Review of Systems  Constitutional: Negative for chills, fatigue and fever.  HENT: Positive for ear pain (ears itchy, occasional pain). Negative for congestion and sore throat.   Eyes: Negative for photophobia.  Respiratory: Positive for cough (mild). Negative for shortness of breath and wheezing.   Cardiovascular: Negative for chest pain, palpitations and leg swelling.  Neurological: Positive for dizziness (occ) and headaches (occasional). Negative for light-headedness.       Objective:   Vitals:     06/30/18 1415  BP: 122/64  Pulse: 72  Resp: 16  Temp: 97.8 F (36.6 C)  SpO2: 93%   BP Readings from Last 3 Encounters:  06/30/18 122/64  05/06/18 140/65  04/28/18 139/64   Wt Readings from Last 3 Encounters:  06/30/18 182 lb (82.6 kg)  05/06/18 180 lb (81.6 kg)  04/28/18 180 lb (81.6 kg)   Body mass index is 29.38 kg/m.   Physical Exam    Constitutional: Appears well-developed and well-nourished. No distress.  HENT:  Head: Normocephalic and atraumatic.  Neck: Neck supple. No tracheal deviation present. No thyromegaly present.  No cervical lymphadenopathy Cardiovascular: Normal rate, regular rhythm and normal heart sounds.   No murmur heard. No carotid bruit .  No edema Pulmonary/Chest: Effort normal and breath sounds normal. No respiratory distress. No has no wheezes. No rales.  Skin: Skin is warm and dry. Not diaphoretic.  Psychiatric: Normal mood and affect. Behavior is normal.      Assessment & Plan:    See Problem List for Assessment and Plan of chronic medical problems.

## 2018-06-30 ENCOUNTER — Ambulatory Visit (INDEPENDENT_AMBULATORY_CARE_PROVIDER_SITE_OTHER): Payer: Medicare Other | Admitting: Internal Medicine

## 2018-06-30 ENCOUNTER — Other Ambulatory Visit (INDEPENDENT_AMBULATORY_CARE_PROVIDER_SITE_OTHER): Payer: Medicare Other

## 2018-06-30 ENCOUNTER — Encounter: Payer: Self-pay | Admitting: Internal Medicine

## 2018-06-30 VITALS — BP 122/64 | HR 72 | Temp 97.8°F | Resp 16 | Ht 66.0 in | Wt 182.0 lb

## 2018-06-30 DIAGNOSIS — E039 Hypothyroidism, unspecified: Secondary | ICD-10-CM | POA: Diagnosis not present

## 2018-06-30 DIAGNOSIS — E1329 Other specified diabetes mellitus with other diabetic kidney complication: Secondary | ICD-10-CM

## 2018-06-30 DIAGNOSIS — E782 Mixed hyperlipidemia: Secondary | ICD-10-CM

## 2018-06-30 DIAGNOSIS — IMO0002 Reserved for concepts with insufficient information to code with codable children: Secondary | ICD-10-CM

## 2018-06-30 DIAGNOSIS — E1365 Other specified diabetes mellitus with hyperglycemia: Secondary | ICD-10-CM

## 2018-06-30 DIAGNOSIS — E041 Nontoxic single thyroid nodule: Secondary | ICD-10-CM | POA: Diagnosis not present

## 2018-06-30 DIAGNOSIS — F411 Generalized anxiety disorder: Secondary | ICD-10-CM

## 2018-06-30 DIAGNOSIS — I10 Essential (primary) hypertension: Secondary | ICD-10-CM

## 2018-06-30 DIAGNOSIS — I6523 Occlusion and stenosis of bilateral carotid arteries: Secondary | ICD-10-CM | POA: Diagnosis not present

## 2018-06-30 LAB — LIPID PANEL
CHOL/HDL RATIO: 3
Cholesterol: 120 mg/dL (ref 0–200)
HDL: 39.5 mg/dL (ref >39.00)
NonHDL: 80.83
Triglycerides: 248 mg/dL — ABNORMAL HIGH (ref 0.0–149.0)
VLDL: 49.6 mg/dL — AB (ref 0.0–40.0)

## 2018-06-30 LAB — CBC WITH DIFFERENTIAL/PLATELET
Basophils Absolute: 0.1 10*3/uL (ref 0.0–0.1)
Basophils Relative: 0.8 % (ref 0.0–3.0)
Eosinophils Absolute: 0.2 10*3/uL (ref 0.0–0.7)
Eosinophils Relative: 2.1 % (ref 0.0–5.0)
HCT: 40.9 % (ref 36.0–46.0)
Hemoglobin: 14.1 g/dL (ref 12.0–15.0)
LYMPHS PCT: 18.9 % (ref 12.0–46.0)
Lymphs Abs: 2.1 10*3/uL (ref 0.7–4.0)
MCHC: 34.5 g/dL (ref 30.0–36.0)
MCV: 88.9 fl (ref 78.0–100.0)
Monocytes Absolute: 0.8 10*3/uL (ref 0.1–1.0)
Monocytes Relative: 6.8 % (ref 3.0–12.0)
Neutro Abs: 8.1 10*3/uL — ABNORMAL HIGH (ref 1.4–7.7)
Neutrophils Relative %: 71.4 % (ref 43.0–77.0)
Platelets: 247 10*3/uL (ref 150.0–400.0)
RBC: 4.59 Mil/uL (ref 3.87–5.11)
RDW: 13.1 % (ref 11.5–15.5)
WBC: 11.4 10*3/uL — ABNORMAL HIGH (ref 4.0–10.5)

## 2018-06-30 LAB — COMPREHENSIVE METABOLIC PANEL
ALT: 14 U/L (ref 0–35)
AST: 17 U/L (ref 0–37)
Albumin: 4.5 g/dL (ref 3.5–5.2)
Alkaline Phosphatase: 38 U/L — ABNORMAL LOW (ref 39–117)
BILIRUBIN TOTAL: 0.6 mg/dL (ref 0.2–1.2)
BUN: 20 mg/dL (ref 6–23)
CO2: 29 mEq/L (ref 19–32)
Calcium: 9.8 mg/dL (ref 8.4–10.5)
Chloride: 102 mEq/L (ref 96–112)
Creatinine, Ser: 0.67 mg/dL (ref 0.40–1.20)
GFR: 90.04 mL/min (ref >60.00)
Glucose, Bld: 220 mg/dL — ABNORMAL HIGH (ref 70–99)
Potassium: 4.6 mEq/L (ref 3.5–5.1)
Sodium: 138 mEq/L (ref 135–145)
Total Protein: 7.1 g/dL (ref 6.0–8.3)

## 2018-06-30 LAB — TSH: TSH: 3.2 u[IU]/mL (ref 0.35–4.50)

## 2018-06-30 LAB — HEMOGLOBIN A1C: Hgb A1c MFr Bld: 7.4 % — ABNORMAL HIGH (ref 4.6–6.5)

## 2018-06-30 LAB — LDL CHOLESTEROL, DIRECT: Direct LDL: 61 mg/dL

## 2018-06-30 MED ORDER — METFORMIN HCL ER 500 MG PO TB24: ORAL_TABLET | ORAL | 1 refills | Status: DC

## 2018-06-30 MED ORDER — DAPAGLIFLOZIN PROPANEDIOL 5 MG PO TABS: 5.0000 mg | ORAL_TABLET | Freq: Every day | ORAL | 5 refills | Status: DC

## 2018-06-30 NOTE — Assessment & Plan Note (Signed)
Controlled, stable Continue current dose of medication  

## 2018-06-30 NOTE — Assessment & Plan Note (Signed)
Clinically euthyroid Check tsh  Titrate med dose if needed  

## 2018-06-30 NOTE — Patient Instructions (Addendum)
  Tests ordered today. Your results will be released to [place] (or called to you) after review, usually within 72hours after test completion. If any changes need to be made, you will be notified at that same time.   Medications reviewed and updated.  Changes include :   Trying farxiga if covered for your sugars.   Your prescription(s) have been submitted to your pharmacy. Please take as directed and contact our office if you believe you are having problem(s) with the medication(s).  Please followup in 6 months

## 2018-06-30 NOTE — Assessment & Plan Note (Signed)
BP well controlled Current regimen effective and well tolerated Continue current medications at current doses cmp  

## 2018-06-30 NOTE — Assessment & Plan Note (Signed)
Check lipid panel  Continue daily statin Regular exercise and healthy diet encouraged  

## 2018-06-30 NOTE — Assessment & Plan Note (Signed)
Recheck thyroid US - ? F/u needed tsh today

## 2018-06-30 NOTE — Assessment & Plan Note (Signed)
Sugars not ideally controlled at home Continue current medications for now Will add farxiga 5 mg daily if covered Check a1c Continue regular exercise and diabetic diet

## 2018-07-02 ENCOUNTER — Encounter: Payer: Self-pay | Admitting: Internal Medicine

## 2018-07-03 ENCOUNTER — Ambulatory Visit
Admission: RE | Admit: 2018-07-03 | Discharge: 2018-07-03 | Disposition: A | Discharge status: Home / Self Care | Payer: Medicare Other | Source: Ambulatory Visit | Attending: Internal Medicine | Admitting: Internal Medicine

## 2018-07-03 ENCOUNTER — Encounter: Payer: Self-pay | Admitting: Internal Medicine

## 2018-07-03 DIAGNOSIS — E041 Nontoxic single thyroid nodule: Secondary | ICD-10-CM

## 2018-07-11 ENCOUNTER — Other Ambulatory Visit: Payer: Self-pay | Admitting: Internal Medicine

## 2018-07-12 MED ORDER — BENAZEPRIL HCL 20 MG PO TABS: 20.0000 mg | ORAL_TABLET | Freq: Every day | ORAL | 1 refills | Status: DC

## 2018-07-18 DIAGNOSIS — R300 Dysuria: Secondary | ICD-10-CM | POA: Diagnosis not present

## 2018-07-18 DIAGNOSIS — M545 Low back pain: Secondary | ICD-10-CM | POA: Diagnosis not present

## 2018-07-18 DIAGNOSIS — N3020 Other chronic cystitis without hematuria: Secondary | ICD-10-CM | POA: Diagnosis not present

## 2018-07-31 ENCOUNTER — Other Ambulatory Visit: Payer: Self-pay | Admitting: Internal Medicine

## 2018-07-31 MED ORDER — SITAGLIPTIN PHOSPHATE 100 MG PO TABS: 100.0000 mg | ORAL_TABLET | Freq: Every day | ORAL | 1 refills | Status: DC

## 2018-07-31 MED ORDER — CLONAZEPAM 0.5 MG PO TABS: ORAL_TABLET | ORAL | 1 refills | Status: DC

## 2018-07-31 NOTE — Telephone Encounter (Signed)
Requested medication (s) are due for refill today: yes  Requested medication (s) are on the active medication list: yes  Last refill:  01/30/18  Future visit scheduled: yes  Notes to clinic:  Not delegated   Requested Prescriptions  Pending Prescriptions Disp Refills   clonazePAM (KLONOPIN) 0.5 MG tablet 90 tablet 1    Sig: TAKE ONE TABLET BY MOUTH ONCE DAILY AS NEEDED FOR ANXIETY     Not Delegated - Psychiatry:  Anxiolytics/Hypnotics Failed - 07/31/2018 11:09 AM      Failed - This refill cannot be delegated      Failed - Urine Drug Screen completed in last 360 days.      Passed - Valid encounter within last 6 months    Recent Outpatient Visits          1 month ago Essential hypertension   [place], Claudina Lick, MD   7 months ago Essential hypertension   [place], Claudina Lick, MD   10 months ago Essential hypertension   [place], Claudina Lick, MD   1 year ago Essential hypertension   [place], Claudina Lick, MD   1 year ago [place], Charlene Brooke, NP      Future Appointments            In 5 months [place], Claudina Lick, MD [facility] [place]         Signed Prescriptions Disp Refills   sitaGLIPtin (JANUVIA) 100 MG tablet 90 tablet 1    Sig: Take 1 tablet (100 mg total) by mouth daily.     Endocrinology:  Diabetes - DPP-4 Inhibitors Passed - 07/31/2018 11:09 AM      Passed - HBA1C is between 0 and 7.9 and within 180 days    Hgb A1c MFr Bld  Date Value Ref Range Status  06/30/2018 7.4 (H) 4.6 - 6.5 % Final    Comment:    Glycemic Control Guidelines for People with Diabetes:Non Diabetic:  <6%Goal of Therapy: <7%Additional Action Suggested:  >8%          Passed - Cr in normal range and within 360 days    Creatinine, Ser  Date Value Ref Range Status  06/30/2018 0.67 0.40 - 1.20 mg/dL  Final         Passed - Valid encounter within last 6 months    Recent Outpatient Visits          1 month ago Essential hypertension   [place], Claudina Lick, MD   7 months ago Essential hypertension   [place], Claudina Lick, MD   10 months ago Essential hypertension   [place], Claudina Lick, MD   1 year ago Essential hypertension   [place], Claudina Lick, MD   1 year ago [place], Charlene Brooke, NP      Future Appointments            In 5 months Burns, Claudina Lick, MD [place], [facility]

## 2018-07-31 NOTE — Telephone Encounter (Addendum)
Copied from [place] [id number]. Topic: Quick Communication - See Telephone Encounter >> Jul 31, 2018 11:01 AM Ivar Drape wrote: CRM for notification. See Telephone encounter for: 07/31/18. Patient would like a refill on her clonazePAM (KLONOPIN) 0.5 MG tablet medication and sitaGLIPtin (JANUVIA) 100 MG tablet medication and have it sent to her preferred pharmacy [facility].

## 2018-07-31 NOTE — Telephone Encounter (Signed)
Last refill was 05/04/18 Last OV was 06/30/2018 Next OV is 12/30/18

## 2018-08-20 ENCOUNTER — Other Ambulatory Visit: Payer: Self-pay | Admitting: Internal Medicine

## 2018-09-02 ENCOUNTER — Other Ambulatory Visit: Payer: Self-pay | Admitting: Internal Medicine

## 2018-09-02 ENCOUNTER — Other Ambulatory Visit: Payer: Self-pay

## 2018-09-02 ENCOUNTER — Encounter: Payer: Self-pay | Admitting: Internal Medicine

## 2018-09-02 MED ORDER — SYNTHROID 25 MCG PO TABS: 25.0000 ug | ORAL_TABLET | Freq: Every day | ORAL | 0 refills | Status: DC

## 2018-09-02 MED ORDER — LEVOTHYROXINE SODIUM 25 MCG PO TABS: 25.0000 ug | ORAL_TABLET | Freq: Every day | ORAL | 0 refills | Status: DC

## 2018-12-03 ENCOUNTER — Other Ambulatory Visit: Payer: Self-pay | Admitting: Internal Medicine

## 2018-12-08 ENCOUNTER — Telehealth: Payer: Self-pay | Admitting: Internal Medicine

## 2018-12-08 ENCOUNTER — Encounter: Payer: Self-pay | Admitting: Internal Medicine

## 2018-12-08 ENCOUNTER — Ambulatory Visit (INDEPENDENT_AMBULATORY_CARE_PROVIDER_SITE_OTHER): Payer: Medicare Other | Admitting: Internal Medicine

## 2018-12-08 DIAGNOSIS — E1365 Other specified diabetes mellitus with hyperglycemia: Secondary | ICD-10-CM | POA: Diagnosis not present

## 2018-12-08 DIAGNOSIS — IMO0002 Reserved for concepts with insufficient information to code with codable children: Secondary | ICD-10-CM

## 2018-12-08 DIAGNOSIS — F411 Generalized anxiety disorder: Secondary | ICD-10-CM

## 2018-12-08 DIAGNOSIS — E782 Mixed hyperlipidemia: Secondary | ICD-10-CM | POA: Diagnosis not present

## 2018-12-08 DIAGNOSIS — I10 Essential (primary) hypertension: Secondary | ICD-10-CM

## 2018-12-08 DIAGNOSIS — E1329 Other specified diabetes mellitus with other diabetic kidney complication: Secondary | ICD-10-CM | POA: Diagnosis not present

## 2018-12-08 DIAGNOSIS — E039 Hypothyroidism, unspecified: Secondary | ICD-10-CM | POA: Diagnosis not present

## 2018-12-08 MED ORDER — DULAGLUTIDE 0.75 MG/0.5ML ~~LOC~~ SOAJ: 0.7500 mg | SUBCUTANEOUS | 5 refills | Status: DC

## 2018-12-08 MED ORDER — SEMAGLUTIDE 3 MG PO TABS: 3.0000 mg | ORAL_TABLET | Freq: Every day | ORAL | 5 refills | Status: DC

## 2018-12-08 MED ORDER — METFORMIN HCL ER 500 MG PO TB24: ORAL_TABLET | ORAL | 1 refills | Status: DC

## 2018-12-08 NOTE — Telephone Encounter (Signed)
sent 

## 2018-12-08 NOTE — Telephone Encounter (Signed)
Pt stated that she is ok to try trulicity. Wants it sent to [facility] [address]

## 2018-12-08 NOTE — Telephone Encounter (Signed)
Do you have any other recommendations for meds or does she need to contact insurance?

## 2018-12-08 NOTE — Telephone Encounter (Signed)
Copied from [place] [phone]. Topic: General - Inquiry >> Dec 08, 2018  1:45 PM Mathis Bud wrote: Reason for CRM: Patient had virtual appt with PCP. Patient was prescribed new medication for diabetes (Semaglutide (RYBELSUS) 3 MG TABS)  Patient stated insurance will not pay and cash it would be $800. Patient would like call back from nurse and PCP to discuss other medications.

## 2018-12-08 NOTE — Assessment & Plan Note (Signed)
Diabetes not currently controlled with current medications We will try Rybelsus 3 mg daily -discussed most likely side effects.  If she tolerates this well over the first month we can increase the dose-advised her to let me know Continue metformin, but will change it to all extended release-1000 mg in the morning, 500 mg in the evening Continue glimepiride and Januvia Continue regular exercise and diabetic diet Follow-up in 3 months and hopefully at that time we can revise her medication

## 2018-12-08 NOTE — Assessment & Plan Note (Signed)
BP Readings from Last 3 Encounters:  06/30/18 122/64  05/06/18 140/65  04/28/18 139/64    BP well controlled Current regimen effective and well tolerated Continue current medications at current doses

## 2018-12-08 NOTE — Assessment & Plan Note (Signed)
Clinically euthyroid Will check blood work at her next visit Continue current dose of levothyroxine

## 2018-12-08 NOTE — Assessment & Plan Note (Signed)
Controlled, stable Continue current dose of medication Lexapro 10 mg daily Clonazepam 0.5 mg daily

## 2018-12-08 NOTE — Telephone Encounter (Signed)
Will she consider trulicity - it is the same type of medication - an injection once a week

## 2018-12-08 NOTE — Progress Notes (Signed)
Virtual Visit via Video Note  I connected with [name] on 12/08/18 at  9:45 AM EDT by a video enabled telemedicine application and verified that I am speaking with the correct person using two identifiers.   I discussed the limitations of evaluation and management by telemedicine and the availability of in person appointments. The patient expressed understanding and agreed to proceed.  The patient is currently at home and I am in the office.    No referring provider.    History of Present Illness: She is here for follow up of her chronic medical conditions.   She is exercising regularly - some walks and yard work.    Diabetes: She is taking her medication daily as prescribed.  She actually increased her Metformin dose because she has not had diarrhea with it which was 1 of the reasons we lowered the dose.  She is currently taking 1000 mg of metformin non-extended release in the morning and 500 mg of extended release in the evening.  She is very compliant with a diabetic diet.  She has lost approximately 5 pounds.  She monitors her sugars and they have been running in the 200's, today 188.   Hypertension: She is taking her medication daily. She is compliant with a low sodium diet.  She denies chest pain, palpitations, edema, shortness of breath and regular headaches. She does not monitor her blood pressure at home.    Hypothyroidism:  She is taking her medication daily.  She denies any recent changes in energy or weight that are unexplained.   Hyperlipidemia: She is taking her medication daily. She is compliant with a low fat/cholesterol diet.    Anxiety: She is taking her medication daily as prescribed. She denies any side effects from the medication. She feels her anxiety is well controlled and she is happy with her current dose of medication.    Review of Systems  Constitutional: Negative for chills, fever and malaise/fatigue.  Respiratory: Negative for cough, shortness of breath  and wheezing.   Cardiovascular: Negative for chest pain, palpitations and leg swelling.  Neurological: Positive for headaches (occasional).     Social History   Socioeconomic History  . Marital status: Widowed    Spouse name: Not on file  . Number of children: 2  . Years of education: Not on file  . Highest education level: Not on file  Occupational History  . Not on file  Social Needs  . Financial resource strain: Not hard at all  . Food insecurity:    Worry: Never true    Inability: Never true  . Transportation needs:    Medical: No    Non-medical: No  Tobacco Use  . Smoking status: Never Smoker  . Smokeless tobacco: Never Used  Substance and Sexual Activity  . Alcohol use: Yes    Comment:  very rarely , < 1 glass wine / month  . Drug use: No  . Sexual activity: Not Currently  Lifestyle  . Physical activity:    Days per week: 5 days    Minutes per session: 50 min  . Stress: To some extent  Relationships  . Social connections:    Talks on phone: More than three times a week    Gets together: More than three times a week    Attends religious service: More than 4 times per year    Active member of club or organization: Yes    Attends meetings of clubs or organizations: More than 4  times per year    Relationship status: Widowed  Other Topics Concern  . Not on file  Social History Narrative  . Not on file     Observations/Objective: Appears well in NAD Normal mood and affect  Assessment and Plan:  See Problem List for Assessment and Plan of chronic medical problems.   Follow Up Instructions:    I discussed the assessment and treatment plan with the patient. The patient was provided an opportunity to ask questions and all were answered. The patient agreed with the plan and demonstrated an understanding of the instructions.   The patient was advised to call back or seek an in-person evaluation if the symptoms worsen or if the condition fails to improve as  anticipated.  FU in 3 months-will need blood work at that time  Binnie Rail, MD

## 2018-12-08 NOTE — Assessment & Plan Note (Signed)
Continue current dose of pravastatin

## 2018-12-22 DIAGNOSIS — Z961 Presence of intraocular lens: Secondary | ICD-10-CM | POA: Diagnosis not present

## 2018-12-22 DIAGNOSIS — H35371 Puckering of macula, right eye: Secondary | ICD-10-CM | POA: Diagnosis not present

## 2018-12-22 DIAGNOSIS — E119 Type 2 diabetes mellitus without complications: Secondary | ICD-10-CM | POA: Diagnosis not present

## 2018-12-22 LAB — HM DIABETES EYE EXAM

## 2018-12-30 ENCOUNTER — Ambulatory Visit: Payer: Medicare Other | Admitting: Internal Medicine

## 2018-12-30 ENCOUNTER — Other Ambulatory Visit: Payer: Self-pay | Admitting: Internal Medicine

## 2018-12-30 ENCOUNTER — Other Ambulatory Visit: Payer: Self-pay

## 2018-12-30 MED ORDER — METFORMIN HCL ER 500 MG PO TB24: ORAL_TABLET | ORAL | 1 refills | Status: DC

## 2018-12-30 MED ORDER — DULAGLUTIDE 0.75 MG/0.5ML ~~LOC~~ SOAJ: 0.7500 mg | SUBCUTANEOUS | 5 refills | Status: DC

## 2019-01-26 ENCOUNTER — Other Ambulatory Visit: Payer: Self-pay | Admitting: Internal Medicine

## 2019-02-12 ENCOUNTER — Telehealth: Payer: Self-pay | Admitting: Internal Medicine

## 2019-02-12 MED ORDER — TRULICITY 0.75 MG/0.5ML ~~LOC~~ SOAJ: 0.7500 mg | SUBCUTANEOUS | 1 refills | Status: DC

## 2019-02-12 MED ORDER — CLONAZEPAM 0.5 MG PO TABS: ORAL_TABLET | ORAL | 1 refills | Status: DC

## 2019-02-12 NOTE — Telephone Encounter (Signed)
Copied from [place] [id number]. Topic: General - Other >> Feb 12, 2019  1:02 PM Keene Breath wrote: Reason for CRM: Patient called to ask the nurse to call her regarding her medications.  CB# [phone]

## 2019-02-12 NOTE — Telephone Encounter (Signed)
Wants refill of clonazepam  Last OV 12/08/18 Next OV 03/10/19 Last RF 11/09/18

## 2019-02-17 NOTE — Telephone Encounter (Signed)
Spoke with pharmacist and was advised that pt would most likely not receive RX until 7/31 or 8/1. Advised it was okay to proceed with refill.

## 2019-02-17 NOTE — Telephone Encounter (Signed)
Pharrmacy states they cannot hold rx: clonazePAM (KLONOPIN) 0.5 MG tablet for a specific date.  They either have to fill the order or cx the rx.  Please advise.   Ph: [phone] Ref: [id number]

## 2019-03-08 NOTE — Patient Instructions (Addendum)
  Tests ordered today. Your results will be released to MyChart (or called to you) after review.  If any changes need to be made, you will be notified at that same time.   Medications reviewed and updated.  Changes include :   none  Your prescription(s) have been submitted to your pharmacy. Please take as directed and contact our office if you believe you are having problem(s) with the medication(s).    Please followup in 6 months   

## 2019-03-08 NOTE — Progress Notes (Signed)
Subjective:    Patient ID: [name], female    DOB: March 10, 1941, 78 y.o.   MRN: [id number]  HPI The patient is here for follow up.  She is exercising regularly - walking, yard work.  Diabetes: she started trulicity at her last visit.  She is taking her medication daily as prescribed. She is compliant with a diabetic diet.   She has lost some weight.  She has mild numbness/tingling in her feet.   She is up-to-date with an ophthalmology examination.   Hypertension: She is taking her medication daily. She is compliant with a low sodium diet.  She denies chest pain, palpitations, edema, shortness of breath and regular headaches.     Hypothyroidism:  She is taking her medication daily.  She denies any recent changes in energy or weight that are unexplained.   Hyperlipidemia: She is taking her medication daily. She is compliant with a low fat/cholesterol diet. She denies myalgias.   Anxiety: She is taking her medication daily as prescribed. She denies any side effects from the medication. She feels her anxiety is well controlled and she is happy with her current dose of medication.   Dysuria:  She has had some dysuria and increased urinary frequency for a couple of days.       Medications and allergies reviewed with patient and updated if appropriate.  Patient Active Problem List   Diagnosis Date Noted  . Trigger finger, left middle finger 04/28/2018  . Hypothyroidism 03/29/2017  . Bilateral carotid artery stenosis 09/25/2016  . Generalized anxiety disorder 06/20/2015  . SUI (stress urinary incontinence, female) 02/05/2014  . 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 Cerebral artery occlusion with cerebral infarction ([place]) 05/31/2010  . DM (diabetes mellitus), secondary, uncontrolled, with renal complications ([place]) 40/98/1191  . THYROID NODULE, RIGHT 01/21/2009  . Vitamin D deficiency 12/10/2007  . HYPERLIPIDEMIA 06/17/2007  . Essential hypertension 06/17/2007  . Osteopenia  06/17/2007  . Osteoarthrosis, unspecified whether generalized or localized, unspecified site 11/11/2006    Current Outpatient Medications on File Prior to Visit  Medication Sig Dispense Refill  . aspirin 81 MG tablet Take 81 mg by mouth at bedtime.     . benazepril (LOTENSIN) 20 MG tablet TAKE 1 TABLET DAILY 90 tablet 1  . Blood Glucose Monitoring Suppl (FREESTYLE LITE) DEVI Use meter daily to check blood sugar as instructed. 1 each 3  . calcium citrate-vitamin D 500-400 MG-UNIT chewable tablet Chew 1 tablet by mouth at bedtime.     . Cholecalciferol (VITAMIN D3) 1000 UNITS CAPS Take 1 capsule by mouth daily.    . clonazePAM (KLONOPIN) 0.5 MG tablet TAKE ONE TABLET BY MOUTH ONCE DAILY AS NEEDED FOR ANXIETY 90 tablet 1  . CRANBERRY PO Take 300 mg by mouth daily.    . cycloSPORINE (RESTASIS) 0.05 % ophthalmic emulsion Place 1 drop into both eyes 2 (two) times daily.    . Dulaglutide (TRULICITY) 4.78 GN/5.6OZ SOPN Inject 0.75 mg into the skin once a week. 6 pen 1  . glucose blood (FREESTYLE LITE) test strip USE TO CHECK BLOOD SUGAR DAILY AS DIRECTED 100 each 2  . JANUVIA 100 MG tablet TAKE 1 TABLET DAILY 90 tablet 3  . Lancets (FREESTYLE) lancets Use as instructed 100 each 2  . levothyroxine (SYNTHROID) 25 MCG tablet TAKE 1 TABLET DAILY BEFORE BREAKFAST 90 tablet 3  . methylcellulose (ARTIFICIAL TEARS) 1 % ophthalmic solution Place 1 drop into both eyes daily as needed (itching eyes).     Marland Kitchen  Multiple Vitamins-Calcium (ONE-A-DAY WOMENS FORMULA) TABS Take 1 tablet by mouth daily.    . pravastatin (PRAVACHOL) 40 MG tablet TAKE 1 TABLET AT BEDTIME 90 tablet 1   No current facility-administered medications on file prior to visit.     Past Medical History:  Diagnosis Date  . Anxiety   . Arthritis HANDS, NECK , BACK  . Asymptomatic carotid artery stenosis LEFT --  MOD. PER DR WILLIS NOTE OCT 2012  . Benign heart murmur   . Depression    PMH of  . Diabetes mellitus ORAL MED  . DVT (deep venous  thrombosis) ([place]) 2006   after arthroscopy  . Frequency of urination   . History of CVA (cerebrovascular accident) NEUROLOGIST  DR WILLIS----  06-01-2010-  LEFT BASAL GANGLIA HEMORRAGE   RESIDUAL RIGHT HAND NUMBNESS/TINGLING  . Hyperlipidemia   . Hypertension   . Hypothyroid   . MRSA (methicillin resistant staph aureus) culture positive    X3; last post TKR  . Nocturia   . Numbness and tingling in right hand RESIDUAL FROM CVA NOV 2011  . Obesity    Redux therapy  . Osteoporosis   . Thyroid disease    right thyroid nodule-- BX DONE 08-15-2011    Past Surgical History:  Procedure Laterality Date  . ABDOMINAL HYSTERECTOMY  1973   Endometriosis & fibroid  . CARPAL TUNNEL RELEASE  2000   RIGHT  . COLONOSCOPY  1999 & 2005   Dr Olevia Perches  . cystoscope     to evaluate recurrent UTIs  . CYSTOSCOPY W/ RETROGRADES  08/22/2011   Procedure: CYSTOSCOPY WITH RETROGRADE PYELOGRAM;  Surgeon: Molli Hazard, MD;  Location[facility];  Service: Urology;  Laterality: Bilateral;  . CYSTOSCOPY WITH BIOPSY  08/22/2011   Procedure: CYSTOSCOPY WITH BIOPSY;  Surgeon: Molli Hazard, MD;  Location: [facility];  Service: Urology;  Laterality: N/A;  . g2 p2    . JOINT REPLACEMENT  03-28-2006   LEFT THUMB  . KNEE ARTHROSCOPY     BILATERAL PRIOR TO TOTAL KNEE  . LEFT SHOULDER SURG  1990'S  . LUMBAR LAMINECTOMY  08-26-2009   L 4 - 5  . THYROID NODULE BX  08-15-2011   RIGHT  . TOTAL KNEE ARTHROPLASTY  05-26-2002      LEFT   AND RIGHT  03-30-2098  . WRIST SURGERY  2014   tendon placed in joint; Dr Burney Gauze    Social History   Socioeconomic History  . Marital status: Widowed    Spouse name: Not on file  . Number of children: 2  . Years of education: Not on file  . Highest education level: Not on file  Occupational History  . Not on file  Social Needs  . Financial resource strain: Not hard at all  . Food insecurity    Worry: Never true     Inability: Never true  . Transportation needs    Medical: No    Non-medical: No  Tobacco Use  . Smoking status: Never Smoker  . Smokeless tobacco: Never Used  Substance and Sexual Activity  . Alcohol use: Yes    Comment:  very rarely , < 1 glass wine / month  . Drug use: No  . Sexual activity: Not Currently  Lifestyle  . Physical activity    Days per week: 5 days    Minutes per session: 50 min  . Stress: To some extent  Relationships  . Social connections    Talks  on phone: More than three times a week    Gets together: More than three times a week    Attends religious service: More than 4 times per year    Active member of club or organization: Yes    Attends meetings of clubs or organizations: More than 4 times per year    Relationship status: Widowed  Other Topics Concern  . Not on file  Social History Narrative  . Not on file    Family History  Problem Relation Age of Onset  . Bladder Cancer Mother   . Diabetes Mother   . Brain cancer Sister   . Stroke Paternal Grandfather        in 90s  . Diabetes Sister 63       TYPE 1   . Prostate cancer Brother   . Alzheimer's disease Brother   . Diabetes Maternal Aunt   . Diabetes Maternal Uncle   . Diabetes Maternal Grandmother        Type 2  . Diabetes Maternal Grandfather        Type 2  . Pancreatic cancer Sister   . Kidney failure Sister        in context of DM & influenza  . Heart disease Neg Hx     Review of Systems  Constitutional: Negative for chills and fever.  Respiratory: Negative for cough, shortness of breath and wheezing.   Cardiovascular: Negative for chest pain, palpitations and leg swelling.  Gastrointestinal: Negative for abdominal pain and nausea.  Genitourinary: Positive for dysuria and frequency. Negative for hematuria.  Neurological: Positive for headaches (occ, from neck). Negative for light-headedness.       Objective:   Vitals:   03/10/19 0949  BP: (!) 154/78  Pulse: 63  Resp: 16   Temp: 98.2 F (36.8 C)  SpO2: 97%   BP Readings from Last 3 Encounters:  03/10/19 (!) 154/78  06/30/18 122/64  05/06/18 140/65   Wt Readings from Last 3 Encounters:  03/10/19 175 lb (79.4 kg)  06/30/18 182 lb (82.6 kg)  05/06/18 180 lb (81.6 kg)   Body mass index is 28.25 kg/m.   Physical Exam    Constitutional: Appears well-developed and well-nourished. No distress.  HENT:  Head: Normocephalic and atraumatic.  Neck: Neck supple. No tracheal deviation present. No thyromegaly present.  No cervical lymphadenopathy Cardiovascular: Normal rate, regular rhythm and normal heart sounds.   No murmur heard. No carotid bruit .  No edema Pulmonary/Chest: Effort normal and breath sounds normal. No respiratory distress. No has no wheezes. No rales.  Skin: Skin is warm and dry. Not diaphoretic.  Psychiatric: Normal mood and affect. Behavior is normal.    Diabetic Foot Exam - Simple   Simple Foot Form Diabetic Foot exam was performed with the following findings: Yes 03/10/2019 10:22 AM  Visual Inspection No deformities, no ulcerations, no other skin breakdown bilaterally: Yes Sensation Testing Intact to touch and monofilament testing bilaterally: Yes Pulse Check Posterior Tibialis and Dorsalis pulse intact bilaterally: Yes Comments      Assessment & Plan:    See Problem List for Assessment and Plan of chronic medical problems.

## 2019-03-10 ENCOUNTER — Other Ambulatory Visit (INDEPENDENT_AMBULATORY_CARE_PROVIDER_SITE_OTHER): Payer: Medicare Other

## 2019-03-10 ENCOUNTER — Ambulatory Visit (INDEPENDENT_AMBULATORY_CARE_PROVIDER_SITE_OTHER): Payer: Medicare Other | Admitting: Internal Medicine

## 2019-03-10 ENCOUNTER — Other Ambulatory Visit: Payer: Self-pay

## 2019-03-10 ENCOUNTER — Encounter: Payer: Self-pay | Admitting: Internal Medicine

## 2019-03-10 VITALS — BP 154/78 | HR 63 | Temp 98.2°F | Resp 16 | Ht 66.0 in | Wt 175.0 lb

## 2019-03-10 DIAGNOSIS — E039 Hypothyroidism, unspecified: Secondary | ICD-10-CM | POA: Diagnosis not present

## 2019-03-10 DIAGNOSIS — E1365 Other specified diabetes mellitus with hyperglycemia: Secondary | ICD-10-CM | POA: Diagnosis not present

## 2019-03-10 DIAGNOSIS — E782 Mixed hyperlipidemia: Secondary | ICD-10-CM

## 2019-03-10 DIAGNOSIS — E1329 Other specified diabetes mellitus with other diabetic kidney complication: Secondary | ICD-10-CM

## 2019-03-10 DIAGNOSIS — I10 Essential (primary) hypertension: Secondary | ICD-10-CM

## 2019-03-10 DIAGNOSIS — R300 Dysuria: Secondary | ICD-10-CM

## 2019-03-10 DIAGNOSIS — IMO0002 Reserved for concepts with insufficient information to code with codable children: Secondary | ICD-10-CM

## 2019-03-10 DIAGNOSIS — F411 Generalized anxiety disorder: Secondary | ICD-10-CM | POA: Diagnosis not present

## 2019-03-10 LAB — COMPREHENSIVE METABOLIC PANEL
ALT: 11 U/L (ref 0–35)
AST: 13 U/L (ref 0–37)
Albumin: 4.5 g/dL (ref 3.5–5.2)
Alkaline Phosphatase: 31 U/L — ABNORMAL LOW (ref 39–117)
BUN: 13 mg/dL (ref 6–23)
CO2: 27 mEq/L (ref 19–32)
Calcium: 9.8 mg/dL (ref 8.4–10.5)
Chloride: 103 mEq/L (ref 96–112)
Creatinine, Ser: 0.72 mg/dL (ref 0.40–1.20)
GFR: 77.83 mL/min (ref >60.00)
Glucose, Bld: 161 mg/dL — ABNORMAL HIGH (ref 70–99)
Potassium: 4.3 mEq/L (ref 3.5–5.1)
Sodium: 138 mEq/L (ref 135–145)
Total Bilirubin: 0.7 mg/dL (ref 0.2–1.2)
Total Protein: 7 g/dL (ref 6.0–8.3)

## 2019-03-10 LAB — CBC WITH DIFFERENTIAL/PLATELET
Basophils Absolute: 0.1 10*3/uL (ref 0.0–0.1)
Basophils Relative: 0.5 % (ref 0.0–3.0)
Eosinophils Absolute: 0.2 10*3/uL (ref 0.0–0.7)
Eosinophils Relative: 2.1 % (ref 0.0–5.0)
HCT: 38.8 % (ref 36.0–46.0)
Hemoglobin: 13.4 g/dL (ref 12.0–15.0)
Lymphocytes Relative: 15.6 % (ref 12.0–46.0)
Lymphs Abs: 1.8 10*3/uL (ref 0.7–4.0)
MCHC: 34.6 g/dL (ref 30.0–36.0)
MCV: 89.9 fl (ref 78.0–100.0)
Monocytes Absolute: 0.7 10*3/uL (ref 0.1–1.0)
Monocytes Relative: 6.1 % (ref 3.0–12.0)
Neutro Abs: 8.7 10*3/uL — ABNORMAL HIGH (ref 1.4–7.7)
Neutrophils Relative %: 75.7 % (ref 43.0–77.0)
Platelets: 228 10*3/uL (ref 150.0–400.0)
RBC: 4.32 Mil/uL (ref 3.87–5.11)
RDW: 13.2 % (ref 11.5–15.5)
WBC: 11.5 10*3/uL — ABNORMAL HIGH (ref 4.0–10.5)

## 2019-03-10 LAB — URINALYSIS, ROUTINE W REFLEX MICROSCOPIC
Bilirubin Urine: NEGATIVE
Ketones, ur: NEGATIVE
Nitrite: NEGATIVE
Specific Gravity, Urine: 1.025 (ref 1.000–1.030)
Total Protein, Urine: NEGATIVE
Urine Glucose: NEGATIVE
Urobilinogen, UA: 0.2 (ref 0.0–1.0)
pH: 5.5 (ref 5.0–8.0)

## 2019-03-10 LAB — TSH: TSH: 5.8 u[IU]/mL — ABNORMAL HIGH (ref 0.35–4.50)

## 2019-03-10 LAB — LIPID PANEL
Cholesterol: 117 mg/dL (ref 0–200)
HDL: 43.4 mg/dL (ref >39.00)
LDL Cholesterol: 52 mg/dL (ref 0–99)
NonHDL: 73.19
Total CHOL/HDL Ratio: 3
Triglycerides: 108 mg/dL (ref 0.0–149.0)
VLDL: 21.6 mg/dL (ref 0.0–40.0)

## 2019-03-10 LAB — HEMOGLOBIN A1C: Hgb A1c MFr Bld: 6.6 % — ABNORMAL HIGH (ref 4.6–6.5)

## 2019-03-10 MED ORDER — ESCITALOPRAM OXALATE 10 MG PO TABS: ORAL_TABLET | ORAL | 1 refills | Status: DC

## 2019-03-10 MED ORDER — GLIMEPIRIDE 2 MG PO TABS: 2.0000 mg | ORAL_TABLET | Freq: Two times a day (BID) | ORAL | 1 refills | Status: DC

## 2019-03-10 MED ORDER — METFORMIN HCL ER 500 MG PO TB24: ORAL_TABLET | ORAL | 1 refills | Status: DC

## 2019-03-10 NOTE — Assessment & Plan Note (Signed)
a1c Continue current medications Continue regular exercise

## 2019-03-10 NOTE — Assessment & Plan Note (Signed)
Check lipid panel  Continue daily statin Regular exercise and healthy diet encouraged  

## 2019-03-10 NOTE — Assessment & Plan Note (Signed)
Controlled, stable Continue current dose of medication  

## 2019-03-10 NOTE — Assessment & Plan Note (Signed)
Clinically euthyroid Check tsh  Titrate med dose if needed  

## 2019-03-10 NOTE — Assessment & Plan Note (Signed)
UA, Ucx 

## 2019-03-10 NOTE — Assessment & Plan Note (Signed)
BP slightly elevated today, but typically better  BP Readings from Last 3 Encounters:  03/10/19 (!) 154/78  06/30/18 122/64  05/06/18 140/65    Continue current medications cmp

## 2019-03-11 DIAGNOSIS — Z1231 Encounter for screening mammogram for malignant neoplasm of breast: Secondary | ICD-10-CM | POA: Diagnosis not present

## 2019-03-12 ENCOUNTER — Other Ambulatory Visit: Payer: Self-pay | Admitting: Internal Medicine

## 2019-03-12 ENCOUNTER — Encounter: Payer: Self-pay | Admitting: Internal Medicine

## 2019-03-12 LAB — URINE CULTURE
MICRO NUMBER:: 783480
SPECIMEN QUALITY:: ADEQUATE

## 2019-03-12 MED ORDER — AMOXICILLIN-POT CLAVULANATE 875-125 MG PO TABS: 1.0000 | ORAL_TABLET | Freq: Two times a day (BID) | ORAL | 0 refills | Status: AC

## 2019-03-23 DIAGNOSIS — N3020 Other chronic cystitis without hematuria: Secondary | ICD-10-CM | POA: Diagnosis not present

## 2019-04-02 ENCOUNTER — Encounter: Payer: Self-pay | Admitting: Internal Medicine

## 2019-05-08 DIAGNOSIS — Z23 Encounter for immunization: Secondary | ICD-10-CM | POA: Diagnosis not present

## 2019-06-03 ENCOUNTER — Other Ambulatory Visit: Payer: Self-pay | Admitting: Internal Medicine

## 2019-06-25 ENCOUNTER — Other Ambulatory Visit: Payer: Self-pay | Admitting: Internal Medicine

## 2019-07-28 ENCOUNTER — Other Ambulatory Visit: Payer: Self-pay | Admitting: Internal Medicine

## 2019-07-29 ENCOUNTER — Ambulatory Visit (INDEPENDENT_AMBULATORY_CARE_PROVIDER_SITE_OTHER): Payer: Medicare Other

## 2019-07-29 ENCOUNTER — Ambulatory Visit (INDEPENDENT_AMBULATORY_CARE_PROVIDER_SITE_OTHER): Payer: Medicare Other | Admitting: Orthopaedic Surgery

## 2019-07-29 ENCOUNTER — Encounter: Payer: Self-pay | Admitting: Orthopaedic Surgery

## 2019-07-29 ENCOUNTER — Other Ambulatory Visit: Payer: Self-pay

## 2019-07-29 VITALS — Ht 66.0 in | Wt 163.0 lb

## 2019-07-29 DIAGNOSIS — M542 Cervicalgia: Secondary | ICD-10-CM | POA: Diagnosis not present

## 2019-07-29 NOTE — Telephone Encounter (Signed)
Check [place] registry last filled 04/29/2019.Marland KitchenJohny Chess

## 2019-07-29 NOTE — Progress Notes (Signed)
Office Visit Note   Patient: [name]           Date of Birth: [birth year]           MRN: [id number] Visit Date: 07/29/2019              Requested by: Binnie Rail, MD [place],  [place] [address] PCP: Binnie Rail, MD   Assessment & Plan: Visit Diagnoses:  1. Neck pain   2. Cervicalgia     Plan: Diffuse degenerative arthritis cervical spine but particularly between C4 and C7.  Long discussion regarding her diagnosis and treatment options.  Does have a soft collar at home but she can use on occasion.,  Use heat.  Over-the-counter medicines and will try a course of physical therapy at [facility].  Return as needed.  Follow-Up Instructions: Return if symptoms worsen or fail to improve.   Orders:  Orders Placed This Encounter  Procedures  . XR Cervical Spine 2 or 3 views   No orders of the defined types were placed in this encounter.     Procedures: No procedures performed   Clinical Data: No additional findings.   Subjective: Chief Complaint  Patient presents with  . Neck - Pain  Patient presents today for neck pain. In 2017 she was working in her garden and fell backwards and hit her head on a brick. She was treated in the hospital and then treated with steroids and gabapentin. She then went to a chiropractor for awhile three times weekly. She has continued to have pain in her neck since on and off, but has worsened recently. No new injury. She said that it hurts more on the left side. She also has headaches that she wakes up. She is now taking Advil or Tylenol for pain. She has numbness in her right hand that started after a stroke 9 years ago.  CT scan of the cervical spine performed after her injury in 2017 demonstrated multilevel degenerative changes but no acute fractures.  Follow-up MRI scan did not reveal any acute traumatic injuries of the cervical spine or spinal cord  HPI  Review of Systems   Objective: Vital  Signs: Ht 5\' 6"  (1.676 m)   Wt 163 lb (73.9 kg)   BMI 26.31 kg/m   Physical Exam Constitutional:      Appearance: She is well-developed.  Eyes:     Pupils: Pupils are equal, round, and reactive to light.  Pulmonary:     Effort: Pulmonary effort is normal.  Skin:    General: Skin is warm and dry.  Neurological:     Mental Status: She is alert and oriented to person, place, and time.  Psychiatric:        Behavior: Behavior normal.     Ortho Exam awake alert and oriented x3.  Comfortable sitting.  Does have some decreased motion of her cervical spine.  She could barely touch her chin to her chest.  Had about 60% of normal neck extension at which point she had some posterior neck pain.  There was no referred pain to either shoulder or upper extremity.  About 70 to 75% normal rotation of the right and to the left.  Reflexes were symmetrical both upper extremities with good strength.  [name] had a prior stroke with some residual altered sensibility in her right hand with some weakened grip but it was quite supple on exam today Specialty Comments:  No  specialty comments available.  Imaging: XR Cervical Spine 2 or 3 views  Result Date: 07/29/2019 Films of the cervical spine obtained in several projections.  There is straightening of the normal lordotic curve and considerable degenerative disc disease and facet arthritis between C4-5, C5-6 and C6-7.  No listhesis.  No acute changes    PMFS History: Patient Active Problem List   Diagnosis Date Noted  . Cervicalgia 07/29/2019  . Trigger finger, left middle finger 04/28/2018  . Hypothyroidism 03/29/2017  . Bilateral carotid artery stenosis 09/25/2016  . Generalized anxiety disorder 06/20/2015  . SUI (stress urinary incontinence, female) 02/05/2014  . 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 Cerebral artery occlusion with cerebral infarction ([place]) 05/31/2010  . DM (diabetes mellitus), secondary, uncontrolled, with renal  complications ([place]) [id number]  . Dysuria 01/24/2010  . THYROID NODULE, RIGHT 01/21/2009  . Vitamin D deficiency 12/10/2007  . HYPERLIPIDEMIA 06/17/2007  . Essential hypertension 06/17/2007  . Osteopenia 06/17/2007  . Osteoarthrosis, unspecified whether generalized or localized, unspecified site 11/11/2006   Past Medical History:  Diagnosis Date  . Anxiety   . Arthritis HANDS, NECK , BACK  . Asymptomatic carotid artery stenosis LEFT --  MOD. PER DR WILLIS NOTE OCT 2012  . Benign heart murmur   . Depression    PMH of  . Diabetes mellitus ORAL MED  . DVT (deep venous thrombosis) ([place]) 2006   after arthroscopy  . Frequency of urination   . History of CVA (cerebrovascular accident) NEUROLOGIST  DR WILLIS----  06-01-2010-  LEFT BASAL GANGLIA HEMORRAGE   RESIDUAL RIGHT HAND NUMBNESS/TINGLING  . Hyperlipidemia   . Hypertension   . Hypothyroid   . MRSA (methicillin resistant staph aureus) culture positive    X3; last post TKR  . Nocturia   . Numbness and tingling in right hand RESIDUAL FROM CVA NOV 2011  . Obesity    Redux therapy  . Osteoporosis   . Thyroid disease    right thyroid nodule-- BX DONE 08-15-2011    Family History  Problem Relation Age of Onset  . Bladder Cancer Mother   . Diabetes Mother   . Brain cancer Sister   . Stroke Paternal Grandfather        in 55s  . Diabetes Sister 84       TYPE 1   . Prostate cancer Brother   . Alzheimer's disease Brother   . Diabetes Maternal Aunt   . Diabetes Maternal Uncle   . Diabetes Maternal Grandmother        Type 2  . Diabetes Maternal Grandfather        Type 2  . Pancreatic cancer Sister   . Kidney failure Sister        in context of DM & influenza  . Heart disease Neg Hx     Past Surgical History:  Procedure Laterality Date  . ABDOMINAL HYSTERECTOMY  1973   Endometriosis & fibroid  . CARPAL TUNNEL RELEASE  2000   RIGHT  . COLONOSCOPY  1999 & 2005   Dr Olevia Perches  . cystoscope     to evaluate recurrent UTIs  .  CYSTOSCOPY W/ RETROGRADES  08/22/2011   Procedure: CYSTOSCOPY WITH RETROGRADE PYELOGRAM;  Surgeon: Molli Hazard, MD;  Location: [facility];  Service: Urology;  Laterality: Bilateral;  . CYSTOSCOPY WITH BIOPSY  08/22/2011   Procedure: CYSTOSCOPY WITH BIOPSY;  Surgeon: Molli Hazard, MD;  Location: [facility];  Service: Urology;  Laterality: N/A;  . g2  p2    . JOINT REPLACEMENT  03-28-2006   LEFT THUMB  . KNEE ARTHROSCOPY     BILATERAL PRIOR TO TOTAL KNEE  . LEFT SHOULDER SURG  1990'S  . LUMBAR LAMINECTOMY  08-26-2009   L 4 - 5  . THYROID NODULE BX  08-15-2011   RIGHT  . TOTAL KNEE ARTHROPLASTY  05-26-2002      LEFT   AND RIGHT  03-30-2098  . WRIST SURGERY  2014   tendon placed in joint; Dr Burney Gauze   Social History   Occupational History  . Not on file  Tobacco Use  . Smoking status: Never Smoker  . Smokeless tobacco: Never Used  Substance and Sexual Activity  . Alcohol use: Yes    Comment:  very rarely , < 1 glass wine / month  . Drug use: No  . Sexual activity: Not Currently

## 2019-08-11 ENCOUNTER — Ambulatory Visit: Payer: Medicare Other | Attending: Internal Medicine

## 2019-08-11 ENCOUNTER — Other Ambulatory Visit: Payer: Medicare Other

## 2019-08-11 DIAGNOSIS — Z23 Encounter for immunization: Secondary | ICD-10-CM | POA: Insufficient documentation

## 2019-08-11 NOTE — Progress Notes (Signed)
   Covid-19 Vaccination Clinic  Name:  [name]    MRN: [id number] DOB: 02-23-1941  08/11/2019  [name] was observed post Covid-19 immunization for 15 minutes without incidence. She was provided with Vaccine Information Sheet and instruction to access the V-Safe system.   [name] was instructed to call 911 with any severe reactions post vaccine: Marland Kitchen Difficulty breathing  . Swelling of your face and throat  . A fast heartbeat  . A bad rash all over your body  . Dizziness and weakness    Immunizations Administered    Name Date Dose VIS Date Route   Pfizer COVID-19 Vaccine 08/11/2019 10:10 AM 0.3 mL 07/03/2019 Intramuscular   Manufacturer: [employer], [employer]   Lot: [id number]   [place]: [id number]

## 2019-08-12 ENCOUNTER — Ambulatory Visit: Payer: Medicare Other

## 2019-08-14 DIAGNOSIS — N3020 Other chronic cystitis without hematuria: Secondary | ICD-10-CM | POA: Diagnosis not present

## 2019-08-19 ENCOUNTER — Encounter: Payer: Self-pay | Admitting: Physical Therapy

## 2019-08-19 ENCOUNTER — Ambulatory Visit: Payer: Medicare Other | Attending: Orthopaedic Surgery | Admitting: Physical Therapy

## 2019-08-19 ENCOUNTER — Other Ambulatory Visit: Payer: Self-pay

## 2019-08-19 DIAGNOSIS — R252 Cramp and spasm: Secondary | ICD-10-CM

## 2019-08-19 DIAGNOSIS — M542 Cervicalgia: Secondary | ICD-10-CM | POA: Insufficient documentation

## 2019-08-19 NOTE — Patient Instructions (Addendum)

## 2019-08-19 NOTE — Therapy (Signed)
[place] [address] [place], [place], [address] Phone: [phone]   Fax:  [phone]  Physical Therapy Evaluation  Patient Details  Name: [name] MRN: [id number] Date of Birth: [birth year] Referring Provider (PT): Durward Fortes   Encounter Date: 08/19/2019  PT End of Session - 08/19/19 1547    Visit Number  1    Date for PT Re-Evaluation  10/17/19    PT Start Time  1510    PT Stop Time  1556    PT Time Calculation (min)  46 min    Activity Tolerance  Patient tolerated treatment well    Behavior During Therapy  [facility] for tasks assessed/performed       Past Medical History:  Diagnosis Date  . Anxiety   . Arthritis HANDS, NECK , BACK  . Asymptomatic carotid artery stenosis LEFT --  MOD. PER DR WILLIS NOTE OCT 2012  . Benign heart murmur   . Depression    PMH of  . Diabetes mellitus ORAL MED  . DVT (deep venous thrombosis) ([place]) 2006   after arthroscopy  . Frequency of urination   . History of CVA (cerebrovascular accident) NEUROLOGIST  DR WILLIS----  06-01-2010-  LEFT BASAL GANGLIA HEMORRAGE   RESIDUAL RIGHT HAND NUMBNESS/TINGLING  . Hyperlipidemia   . Hypertension   . Hypothyroid   . MRSA (methicillin resistant staph aureus) culture positive    X3; last post TKR  . Nocturia   . Numbness and tingling in right hand RESIDUAL FROM CVA NOV 2011  . Obesity    Redux therapy  . Osteoporosis   . Thyroid disease    right thyroid nodule-- BX DONE 08-15-2011    Past Surgical History:  Procedure Laterality Date  . ABDOMINAL HYSTERECTOMY  1973   Endometriosis & fibroid  . CARPAL TUNNEL RELEASE  2000   RIGHT  . COLONOSCOPY  1999 & 2005   Dr Olevia Perches  . cystoscope     to evaluate recurrent UTIs  . CYSTOSCOPY W/ RETROGRADES  08/22/2011   Procedure: CYSTOSCOPY WITH RETROGRADE PYELOGRAM;  Surgeon: Molli Hazard, MD;  Location[facility];  Service: Urology;  Laterality: Bilateral;  .  CYSTOSCOPY WITH BIOPSY  08/22/2011   Procedure: CYSTOSCOPY WITH BIOPSY;  Surgeon: Molli Hazard, MD;  Location: [facility];  Service: Urology;  Laterality: N/A;  . g2 p2    . JOINT REPLACEMENT  03-28-2006   LEFT THUMB  . KNEE ARTHROSCOPY     BILATERAL PRIOR TO TOTAL KNEE  . LEFT SHOULDER SURG  1990'S  . LUMBAR LAMINECTOMY  08-26-2009   L 4 - 5  . THYROID NODULE BX  08-15-2011   RIGHT  . TOTAL KNEE ARTHROPLASTY  05-26-2002      LEFT   AND RIGHT  03-30-2098  . WRIST SURGERY  2014   tendon placed in joint; Dr Burney Gauze    There were no vitals filed for this visit.   Subjective Assessment - 08/19/19 1514    Subjective  Fell in the garden and hit head in 2017, reports that she was knocked out and has had some neck pain since then.  She saw a chiropractor and it helped some.    Pertinent History  CVA, Total joint replacements    Limitations  Reading;House hold activities    Diagnostic tests  negative except for DDD and arthritis    Patient Stated Goals  have less pain, less HA, work in  the yard    Currently in Pain?  Yes    Pain Score  1     Pain Location  Neck    Pain Orientation  Left    Pain Descriptors / Indicators  Aching;Dull    Pain Type  Acute pain    Pain Radiating Towards  HA daily    Pain Onset  More than a month ago    Pain Frequency  Constant    Aggravating Factors   reading, sitting, vacuuming pan up to 7/10    Pain Relieving Factors  wearing a collar, icy hot pain a 0-1/10    Effect of Pain on Daily Activities  difficulty vacuuming, sleeping         [facility] PT Assessment - 08/19/19 0001      Assessment   Medical Diagnosis  cervicalgia    Referring Provider (PT)  [place]    Onset Date/Surgical Date  07/19/19    Hand Dominance  Right      Precautions   Precautions  None      Balance Screen   Has the patient fallen in the past 6 months  Yes    How many times?  2    Has the patient had a decrease in activity level because of a fear  of falling?   No    Is the patient reluctant to leave their home because of a fear of falling?   No      Home Environment   Additional Comments  no stairs, loves garden, does some housework      Prior Function   Level of Independence  Independent    Vocation  Retired    Leisure  walks 3x/week      Mining engineer Comments  fwd head, rounded shoulders      ROM / Strength   AROM / PROM / Strength  AROM;Strength      AROM   Overall AROM Comments  shoulder ROM is WFL's except for IR was limited with tightness and c/o tightness inthe upper trap and neck, cervical ROM decreased 50% with pain, rotation left decreased 50% right decreased 75% with pain and tightness on the left, side bending decreased 75% with pain in the left      Strength   Overall Strength Comments  mild loss of strength on the right compared to the left but 4/5      Palpation   Palpation comment  she has some tightness in the upper traps, very tight and tender in the left lateral neck and the left cervical parapsinals      Special Tests   Other special tests  manual traction decreaesd pain                Objective measurements completed on examination: See above findings.                PT Short Term Goals - 08/19/19 1600      PT SHORT TERM GOAL #1   Title  independent iwth initial HEP    Time  2    Period  Weeks    Status  New        PT Long Term Goals - 08/19/19 1600      PT LONG TERM GOAL #1   Title  decrease HA's 50%    Time  8    Period  Weeks    Status  New      PT LONG TERM  GOAL #2   Title  decrease pain 50%    Time  8    Period  Weeks    Status  New      PT LONG TERM GOAL #3   Title  increase cervical ROM 25%    Time  8    Period  Weeks    Status  New      PT LONG TERM GOAL #4   Title  understand posture and body mechanics    Time  8    Period  Weeks    Status  New      PT LONG TERM GOAL #5   Title  work in the garden without increase  of pain    Time  8    Period  Weeks    Status  New             Plan - 08/19/19 1557    Clinical Impression Statement  Patient reports that she has had some neck pain for about 3 years, she had a fall in the garden and fell backward and hit her head, MRI, CT and x-rays showed only DDD and arthritis.  She reports that she has a HA almost daily, she has significant ROM limitations, pain and tightness is mostly on the left, has significant tenderness and tightness int he left cervical paraspinals.  Manual cervical traction felt good.    Personal Factors and Comorbidities  Comorbidity 3+    Comorbidities  arthritis, TKA's, CVA    Stability/Clinical Decision Making  Stable/Uncomplicated    Clinical Decision Making  Low    Rehab Potential  Good    PT Frequency  2x / week    PT Duration  8 weeks    PT Treatment/Interventions  ADLs/Self Care Home Management;Traction;Ultrasound;Moist Heat;Electrical Stimulation;Manual techniques;Therapeutic exercise;Therapeutic activities;Patient/family education;Dry needling    PT Next Visit Plan  start active exercises may try manual traction and STM    Consulted and Agree with Plan of Care  Patient       Patient will benefit from skilled therapeutic intervention in order to improve the following deficits and impairments:  Pain, Improper body mechanics, Increased muscle spasms, Postural dysfunction, Decreased strength, Decreased range of motion  Visit Diagnosis: Cervicalgia - Plan: PT plan of care cert/re-cert  Cramp and spasm - Plan: PT plan of care cert/re-cert     Problem List Patient Active Problem List   Diagnosis Date Noted  . Cervicalgia 07/29/2019  . Trigger finger, left middle finger 04/28/2018  . Hypothyroidism 03/29/2017  . Bilateral carotid artery stenosis 09/25/2016  . Generalized anxiety disorder 06/20/2015  . SUI (stress urinary incontinence, female) 02/05/2014  . 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 Cerebral artery  occlusion with cerebral infarction ([place]) 05/31/2010  . DM (diabetes mellitus), secondary, uncontrolled, with renal complications ([place]) [id number]  . Dysuria 01/24/2010  . THYROID NODULE, RIGHT 01/21/2009  . Vitamin D deficiency 12/10/2007  . HYPERLIPIDEMIA 06/17/2007  . Essential hypertension 06/17/2007  . Osteopenia 06/17/2007  . Osteoarthrosis, unspecified whether generalized or localized, unspecified site 11/11/2006    Sumner Boast., PT 08/19/2019, 4:03 PM  [facility] [place], [place], [address] Phone: [phone]   Fax:  [phone]  Name: LENIS KOZAR MRN: [id number] Date of Birth: October 07, 1940

## 2019-08-25 ENCOUNTER — Other Ambulatory Visit: Payer: Self-pay

## 2019-08-25 ENCOUNTER — Ambulatory Visit: Payer: Medicare Other | Attending: Orthopaedic Surgery | Admitting: Physical Therapy

## 2019-08-25 DIAGNOSIS — M542 Cervicalgia: Secondary | ICD-10-CM | POA: Insufficient documentation

## 2019-08-25 DIAGNOSIS — R252 Cramp and spasm: Secondary | ICD-10-CM | POA: Insufficient documentation

## 2019-08-25 NOTE — Therapy (Signed)
[place], [place], [address] Phone: [phone]   Fax:  [phone]  Physical Therapy Treatment  Patient Details  Name: [name] MRN: [id number] Date of Birth: 02/28/1941 Referring Provider (PT): Durward Fortes   Encounter Date: 08/25/2019  PT End of Session - 08/25/19 0953    Visit Number  2    Date for PT Re-Evaluation  10/17/19    PT Start Time  0933    PT Stop Time  1011    PT Time Calculation (min)  38 min       Past Medical History:  Diagnosis Date  . Anxiety   . Arthritis HANDS, NECK , BACK  . Asymptomatic carotid artery stenosis LEFT --  MOD. PER DR WILLIS NOTE OCT 2012  . Benign heart murmur   . Depression    PMH of  . Diabetes mellitus ORAL MED  . DVT (deep venous thrombosis) ([place]) 2006   after arthroscopy  . Frequency of urination   . History of CVA (cerebrovascular accident) NEUROLOGIST  DR WILLIS----  06-01-2010-  LEFT BASAL GANGLIA HEMORRAGE   RESIDUAL RIGHT HAND NUMBNESS/TINGLING  . Hyperlipidemia   . Hypertension   . Hypothyroid   . MRSA (methicillin resistant staph aureus) culture positive    X3; last post TKR  . Nocturia   . Numbness and tingling in right hand RESIDUAL FROM CVA NOV 2011  . Obesity    Redux therapy  . Osteoporosis   . Thyroid disease    right thyroid nodule-- BX DONE 08-15-2011    Past Surgical History:  Procedure Laterality Date  . ABDOMINAL HYSTERECTOMY  1973   Endometriosis & fibroid  . CARPAL TUNNEL RELEASE  2000   RIGHT  . COLONOSCOPY  1999 & 2005   Dr Olevia Perches  . cystoscope     to evaluate recurrent UTIs  . CYSTOSCOPY W/ RETROGRADES  08/22/2011   Procedure: CYSTOSCOPY WITH RETROGRADE PYELOGRAM;  Surgeon: Molli Hazard, MD;  Location: [facility];  Service: Urology;  Laterality: Bilateral;  . CYSTOSCOPY WITH BIOPSY  08/22/2011   Procedure: CYSTOSCOPY WITH BIOPSY;  Surgeon: Molli Hazard, MD;  Location:  [facility];  Service: Urology;  Laterality: N/A;  . g2 p2    . JOINT REPLACEMENT  03-28-2006   LEFT THUMB  . KNEE ARTHROSCOPY     BILATERAL PRIOR TO TOTAL KNEE  . LEFT SHOULDER SURG  1990'S  . LUMBAR LAMINECTOMY  08-26-2009   L 4 - 5  . THYROID NODULE BX  08-15-2011   RIGHT  . TOTAL KNEE ARTHROPLASTY  05-26-2002      LEFT   AND RIGHT  03-30-2098  . WRIST SURGERY  2014   tendon placed in joint; Dr Burney Gauze    There were no vitals filed for this visit.  Subjective Assessment - 08/25/19 0936    Subjective  oaky except HA and I think exercises are aggravating it    Currently in Pain?  Yes    Pain Score  2     Pain Location  Neck                       [facility] Adult PT Treatment/Exercise - 08/25/19 0001      Exercises   Exercises  Neck;Shoulder      Neck Exercises: Machines for Strengthening   UBE (Upper Arm Bike)  L 2 2 min fwd/ 49min backward  Cybex Row  15 # 2 sets 10   cued ot not engage traps   Lat Pull  15# 2 sets 10      Neck Exercises: Standing   Neck Retraction  15 reps;3 secs   head on ball on wall   Other Standing Exercises  3# shruggs and backward rolls 10 each      Shoulder Exercises: Standing   External Rotation  Strengthening;Both;10 reps;Theraband    Theraband Level (Shoulder External Rotation)  Level 2 (Red)    ABduction  Strengthening;Both;10 reps;Theraband    Theraband Level (Shoulder ABduction)  Level 2 (Red)    Extension  Strengthening;Both;10 reps;Theraband    Theraband Level (Shoulder Extension)  Level 2 (Red)    Row  Strengthening;Both;10 reps;Theraband    Theraband Level (Shoulder Row)  Level 2 (Red)      Modalities   Modalities  Moist Heat;Traction      Moist Heat Therapy   Number Minutes Moist Heat  15 Minutes    Moist Heat Location  Cervical      Traction   Type of Traction  Cervical    Max (lbs)  12    Time  15             PT Education - 08/25/19 0937    Education Details  reviewed HEP And  tretches and reduced to 5 reps vs 10 until neck muscles get looser    Person(s) Educated  Patient    Methods  Explanation;Demonstration    Comprehension  Verbalized understanding       PT Short Term Goals - 08/19/19 1600      PT SHORT TERM GOAL #1   Title  independent iwth initial HEP    Time  2    Period  Weeks    Status  New        PT Long Term Goals - 08/19/19 1600      PT LONG TERM GOAL #1   Title  decrease HA's 50%    Time  8    Period  Weeks    Status  New      PT LONG TERM GOAL #2   Title  decrease pain 50%    Time  8    Period  Weeks    Status  New      PT LONG TERM GOAL #3   Title  increase cervical ROM 25%    Time  8    Period  Weeks    Status  New      PT LONG TERM GOAL #4   Title  understand posture and body mechanics    Time  8    Period  Weeks    Status  New      PT LONG TERM GOAL #5   Title  work in the garden without increase of pain    Time  8    Period  Weeks    Status  New            Plan - 08/25/19 [id number]    Clinical Impression Statement  reviewed and adjusted HEP to decrease reps until muscles relax and ROM increase as she fels it is increasing HA, explained need ot stretch. pt tolerated initial ex progression here well but did need cuing to not engae traps with ex as she tended ot elevate traps with all mvmt.pt responded positively to traction when she left clinic.    PT Treatment/Interventions  ADLs/Self Care Home Management;Traction;Ultrasound;Moist  Heat;Electrical Stimulation;Manual techniques;Therapeutic exercise;Therapeutic activities;Patient/family education;Dry needling    PT Next Visit Plan  assess and progress, could try STW or passive stretching       Patient will benefit from skilled therapeutic intervention in order to improve the following deficits and impairments:  Pain, Improper body mechanics, Increased muscle spasms, Postural dysfunction, Decreased strength, Decreased range of motion  Visit Diagnosis: Cramp and  spasm  Cervicalgia     Problem List Patient Active Problem List   Diagnosis Date Noted  . Cervicalgia 07/29/2019  . Trigger finger, left middle finger 04/28/2018  . Hypothyroidism 03/29/2017  . Bilateral carotid artery stenosis 09/25/2016  . Generalized anxiety disorder 06/20/2015  . SUI (stress urinary incontinence, female) 02/05/2014  . 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 Cerebral artery occlusion with cerebral infarction ([place]) 05/31/2010  . DM (diabetes mellitus), secondary, uncontrolled, with renal complications ([place]) [id number]  . Dysuria 01/24/2010  . THYROID NODULE, RIGHT 01/21/2009  . Vitamin D deficiency 12/10/2007  . HYPERLIPIDEMIA 06/17/2007  . Essential hypertension 06/17/2007  . Osteopenia 06/17/2007  . Osteoarthrosis, unspecified whether generalized or localized, unspecified site 11/11/2006    Gertude Benito,ANGIE  PTA 08/25/2019, 9:57 AM  [place], [place], [address] Phone: [phone]   Fax:  [phone]  Name: [name] MRN: [id number] Date of Birth: [birth year]

## 2019-08-27 ENCOUNTER — Other Ambulatory Visit: Payer: Self-pay

## 2019-08-27 ENCOUNTER — Ambulatory Visit: Payer: Medicare Other | Admitting: Physical Therapy

## 2019-08-27 DIAGNOSIS — M542 Cervicalgia: Secondary | ICD-10-CM | POA: Diagnosis not present

## 2019-08-27 DIAGNOSIS — R252 Cramp and spasm: Secondary | ICD-10-CM | POA: Diagnosis not present

## 2019-08-27 NOTE — Therapy (Signed)
[name], [place], [address] Phone: [phone]   Fax:  [phone]  Physical Therapy Treatment  Patient Details  Name: [name] MRN: [id number] Date of Birth: [birth year] Referring Provider (PT): Durward Fortes   Encounter Date: 08/27/2019  PT End of Session - 08/27/19 1728    Visit Number  3    Date for PT Re-Evaluation  10/17/19    PT Start Time  [id number]    PT Stop Time  [id number]    PT Time Calculation (min)  50 min       Past Medical History:  Diagnosis Date  . Anxiety   . Arthritis HANDS, NECK , BACK  . Asymptomatic carotid artery stenosis LEFT --  MOD. PER DR WILLIS NOTE OCT 2012  . Benign heart murmur   . Depression    PMH of  . Diabetes mellitus ORAL MED  . DVT (deep venous thrombosis) ([place]) 2006   after arthroscopy  . Frequency of urination   . History of CVA (cerebrovascular accident) NEUROLOGIST  DR WILLIS----  06-01-2010-  LEFT BASAL GANGLIA HEMORRAGE   RESIDUAL RIGHT HAND NUMBNESS/TINGLING  . Hyperlipidemia   . Hypertension   . Hypothyroid   . MRSA (methicillin resistant staph aureus) culture positive    X3; last post TKR  . Nocturia   . Numbness and tingling in right hand RESIDUAL FROM CVA NOV 2011  . Obesity    Redux therapy  . Osteoporosis   . Thyroid disease    right thyroid nodule-- BX DONE 08-15-2011    Past Surgical History:  Procedure Laterality Date  . ABDOMINAL HYSTERECTOMY  1973   Endometriosis & fibroid  . CARPAL TUNNEL RELEASE  2000   RIGHT  . COLONOSCOPY  1999 & 2005   Dr Olevia Perches  . cystoscope     to evaluate recurrent UTIs  . CYSTOSCOPY W/ RETROGRADES  08/22/2011   Procedure: CYSTOSCOPY WITH RETROGRADE PYELOGRAM;  Surgeon: Molli Hazard, MD;  Location: [facility];  Service: Urology;  Laterality: Bilateral;  . CYSTOSCOPY WITH BIOPSY  08/22/2011   Procedure: CYSTOSCOPY WITH BIOPSY;  Surgeon: Molli Hazard, MD;  Location:  [facility];  Service: Urology;  Laterality: N/A;  . g2 p2    . JOINT REPLACEMENT  03-28-2006   LEFT THUMB  . KNEE ARTHROSCOPY     BILATERAL PRIOR TO TOTAL KNEE  . LEFT SHOULDER SURG  1990'S  . LUMBAR LAMINECTOMY  08-26-2009   L 4 - 5  . THYROID NODULE BX  08-15-2011   RIGHT  . TOTAL KNEE ARTHROPLASTY  05-26-2002      LEFT   AND RIGHT  03-30-2098  . WRIST SURGERY  2014   tendon placed in joint; Dr Burney Gauze    There were no vitals filed for this visit.  Subjective Assessment - 08/27/19 1657    Subjective  HA after last session but I think traction helped, ROM better. bad HA last night    Currently in Pain?  Yes    Pain Score  2     Pain Location  Neck         [facility] PT Assessment - 08/27/19 0001      AROM   Overall AROM Comments  cerv flex/ext decreased 25%, SB RT decreased 50% with pain, SB left decreased 25%. rootation decreased 50%  [facility] Adult PT Treatment/Exercise - 08/27/19 0001      Neck Exercises: Machines for Strengthening   UBE (Upper Arm Bike)  L 2 2 min fwd/ 32min backward    Cybex Row  15 # 2 sets 10    Lat Pull  15# 2 sets 10      Shoulder Exercises: Standing   External Rotation  Strengthening;Both;Theraband;15 reps    Theraband Level (Shoulder External Rotation)  Level 2 (Red)    ABduction  Strengthening;Both;Theraband;15 reps    Theraband Level (Shoulder ABduction)  Level 2 (Red)    Extension  Strengthening;Both;Theraband;15 reps    Theraband Level (Shoulder Extension)  Level 2 (Red)    Row  Strengthening;Both;15 reps;Theraband    Theraband Level (Shoulder Row)  Level 2 (Red)      Traction   Type of Traction  Cervical    Max (lbs)  12    Time  15      Manual Therapy   Manual Therapy  Soft tissue mobilization;Passive ROM    Soft tissue mobilization  BIL trap and cerv    Passive ROM  cerv PROM with end range stretch to tolerance       Trigger Point Dry Needling - 08/27/19 0001    Consent Given?  Yes     Education Handout Provided  Yes    Muscles Treated Head and Neck  Upper trapezius    Upper Trapezius Response  Twitch reponse elicited             PT Short Term Goals - 08/19/19 1600      PT SHORT TERM GOAL #1   Title  independent iwth initial HEP    Time  2    Period  Weeks    Status  New        PT Long Term Goals - 08/19/19 1600      PT LONG TERM GOAL #1   Title  decrease HA's 50%    Time  8    Period  Weeks    Status  New      PT LONG TERM GOAL #2   Title  decrease pain 50%    Time  8    Period  Weeks    Status  New      PT LONG TERM GOAL #3   Title  increase cervical ROM 25%    Time  8    Period  Weeks    Status  New      PT LONG TERM GOAL #4   Title  understand posture and body mechanics    Time  8    Period  Weeks    Status  New      PT LONG TERM GOAL #5   Title  work in the garden without increase of pain    Time  8    Period  Weeks    Status  New            Plan - 08/27/19 1729    Clinical Impression Statement  pt with increased AROM and tolerated STW and PROM well. tenderness in BIL trap RT> Left and cerv paraspinals Left>RT. progressed ther ex and added DN and continued with traction as she felt it was helpful    PT Treatment/Interventions  ADLs/Self Care Home Management;Traction;Ultrasound;Moist Heat;Electrical Stimulation;Manual techniques;Therapeutic exercise;Therapeutic activities;Patient/family education;Dry needling    PT Next Visit Plan  asses DN and progress with ROM/stretching and postural strength  Patient will benefit from skilled therapeutic intervention in order to improve the following deficits and impairments:  Pain, Improper body mechanics, Increased muscle spasms, Postural dysfunction, Decreased strength, Decreased range of motion  Visit Diagnosis: Cramp and spasm  Cervicalgia     Problem List Patient Active Problem List   Diagnosis Date Noted  . Cervicalgia 07/29/2019  . Trigger finger, left middle  finger 04/28/2018  . Hypothyroidism 03/29/2017  . Bilateral carotid artery stenosis 09/25/2016  . Generalized anxiety disorder 06/20/2015  . SUI (stress urinary incontinence, female) 02/05/2014  . 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 Cerebral artery occlusion with cerebral infarction ([place]) 05/31/2010  . DM (diabetes mellitus), secondary, uncontrolled, with renal complications ([place]) [id number]  . Dysuria 01/24/2010  . THYROID NODULE, RIGHT 01/21/2009  . Vitamin D deficiency 12/10/2007  . HYPERLIPIDEMIA 06/17/2007  . Essential hypertension 06/17/2007  . Osteopenia 06/17/2007  . Osteoarthrosis, unspecified whether generalized or localized, unspecified site 11/11/2006    Sumner Boast., PT 08/27/2019, 5:58 PM  [place], [place], [address] Phone: [phone]   Fax:  [phone]  Name: KAMEELAH FULLEN MRN: [id number] Date of Birth: 05/17/1941

## 2019-08-31 ENCOUNTER — Other Ambulatory Visit: Payer: Self-pay | Admitting: Internal Medicine

## 2019-09-01 ENCOUNTER — Ambulatory Visit: Payer: Medicare Other | Attending: Internal Medicine

## 2019-09-01 DIAGNOSIS — Z23 Encounter for immunization: Secondary | ICD-10-CM | POA: Insufficient documentation

## 2019-09-01 NOTE — Progress Notes (Signed)
   Covid-19 Vaccination Clinic  Name:  KAZIA MORINI    MRN: [id number] DOB: 1941-01-14  09/01/2019  Ms. Massi was observed post Covid-19 immunization for 15 minutes without incidence. She was provided with Vaccine Information Sheet and instruction to access the V-Safe system.   Ms. Claycamp was instructed to call 911 with any severe reactions post vaccine: Marland Kitchen Difficulty breathing  . Swelling of your face and throat  . A fast heartbeat  . A bad rash all over your body  . Dizziness and weakness    Immunizations Administered    Name Date Dose VIS Date Route   Pfizer COVID-19 Vaccine 09/01/2019 10:30 AM 0.3 mL 07/03/2019 Intramuscular   Manufacturer: [place]   Lot: [id number]   [place]: [id number]

## 2019-09-02 ENCOUNTER — Ambulatory Visit: Payer: Medicare Other | Admitting: Physical Therapy

## 2019-09-02 ENCOUNTER — Encounter: Payer: Self-pay | Admitting: Physical Therapy

## 2019-09-02 ENCOUNTER — Other Ambulatory Visit: Payer: Self-pay

## 2019-09-02 DIAGNOSIS — M542 Cervicalgia: Secondary | ICD-10-CM | POA: Diagnosis not present

## 2019-09-02 DIAGNOSIS — R252 Cramp and spasm: Secondary | ICD-10-CM

## 2019-09-02 NOTE — Therapy (Signed)
[address] [place], [place], [address] Phone: [phone]   Fax:  [phone]  Physical Therapy Treatment  Patient Details  Name: [name] MRN: [id number] Date of Birth: [birth year] Referring Provider (PT): Durward Fortes   Encounter Date: 09/02/2019  PT End of Session - 09/02/19 1336    Visit Number  4    Date for PT Re-Evaluation  10/17/19    PT Start Time  1300    PT Stop Time  1355    PT Time Calculation (min)  55 min    Activity Tolerance  Patient tolerated treatment well    Behavior During Therapy  [facility] for tasks assessed/performed       Past Medical History:  Diagnosis Date  . Anxiety   . Arthritis HANDS, NECK , BACK  . Asymptomatic carotid artery stenosis LEFT --  MOD. PER DR WILLIS NOTE OCT 2012  . Benign heart murmur   . Depression    PMH of  . Diabetes mellitus ORAL MED  . DVT (deep venous thrombosis) ([place]) 2006   after arthroscopy  . Frequency of urination   . History of CVA (cerebrovascular accident) NEUROLOGIST  DR WILLIS----  06-01-2010-  LEFT BASAL GANGLIA HEMORRAGE   RESIDUAL RIGHT HAND NUMBNESS/TINGLING  . Hyperlipidemia   . Hypertension   . Hypothyroid   . MRSA (methicillin resistant staph aureus) culture positive    X3; last post TKR  . Nocturia   . Numbness and tingling in right hand RESIDUAL FROM CVA NOV 2011  . Obesity    Redux therapy  . Osteoporosis   . Thyroid disease    right thyroid nodule-- BX DONE 08-15-2011    Past Surgical History:  Procedure Laterality Date  . ABDOMINAL HYSTERECTOMY  1973   Endometriosis & fibroid  . CARPAL TUNNEL RELEASE  2000   RIGHT  . COLONOSCOPY  1999 & 2005   Dr Olevia Perches  . cystoscope     to evaluate recurrent UTIs  . CYSTOSCOPY W/ RETROGRADES  08/22/2011   Procedure: CYSTOSCOPY WITH RETROGRADE PYELOGRAM;  Surgeon: Molli Hazard, MD;  Location: [facility];  Service: Urology;  Laterality: Bilateral;  .  CYSTOSCOPY WITH BIOPSY  08/22/2011   Procedure: CYSTOSCOPY WITH BIOPSY;  Surgeon: Molli Hazard, MD;  Location: [facility];  Service: Urology;  Laterality: N/A;  . g2 p2    . JOINT REPLACEMENT  03-28-2006   LEFT THUMB  . KNEE ARTHROSCOPY     BILATERAL PRIOR TO TOTAL KNEE  . LEFT SHOULDER SURG  1990'S  . LUMBAR LAMINECTOMY  08-26-2009   L 4 - 5  . THYROID NODULE BX  08-15-2011   RIGHT  . TOTAL KNEE ARTHROPLASTY  05-26-2002      LEFT   AND RIGHT  03-30-2098  . WRIST SURGERY  2014   tendon placed in joint; Dr Burney Gauze    There were no vitals filed for this visit.  Subjective Assessment - 09/02/19 1301    Subjective  Doing better overall, but not as good as she was. DN helped    Currently in Pain?  No/denies                       [facility] Adult PT Treatment/Exercise - 09/02/19 0001      Neck Exercises: Machines for Strengthening   UBE (Upper Arm Bike)  L 2 2 min fwd/ 21min backward    Cybex  Row  15 # 2 sets 10    Lat Pull  15# 2 sets 10      Shoulder Exercises: Standing   External Rotation  Strengthening;Both;Theraband;15 reps    Theraband Level (Shoulder External Rotation)  Level 2 (Red)    ABduction  Strengthening;Both;Theraband;20 reps    Shoulder ABduction Weight (lbs)  2    Extension  Strengthening;Both;Theraband;15 reps    Theraband Level (Shoulder Extension)  Level 2 (Red)    Row  Strengthening;Both;15 reps;Theraband    Theraband Level (Shoulder Row)  Level 2 (Red)      Moist Heat Therapy   Number Minutes Moist Heat  10 Minutes    Moist Heat Location  Cervical      Manual Therapy   Manual Therapy  Soft tissue mobilization;Passive ROM    Soft tissue mobilization  Cervical paraspinaled intonthe upper traps. Good tissue elasticity    Passive ROM  cerv PROM with end range stretch to tolerance       Trigger Point Dry Needling - 09/02/19 0001    Consent Given?  Yes    Muscles Treated Head and Neck  Upper trapezius    Upper  Trapezius Response  Twitch reponse elicited             PT Short Term Goals - 08/19/19 1600      PT SHORT TERM GOAL #1   Title  independent iwth initial HEP    Time  2    Period  Weeks    Status  New        PT Long Term Goals - 08/19/19 1600      PT LONG TERM GOAL #1   Title  decrease HA's 50%    Time  8    Period  Weeks    Status  New      PT LONG TERM GOAL #2   Title  decrease pain 50%    Time  8    Period  Weeks    Status  New      PT LONG TERM GOAL #3   Title  increase cervical ROM 25%    Time  8    Period  Weeks    Status  New      PT LONG TERM GOAL #4   Title  understand posture and body mechanics    Time  8    Period  Weeks    Status  New      PT LONG TERM GOAL #5   Title  work in the garden without increase of pain    Time  8    Period  Weeks    Status  New            Plan - 09/02/19 1336    Clinical Impression Statement  Pt report improvement overall. She stated she felt best three days after DN. Positive response to soft tissue work. Postural cues required with rows and extensions.  No reports of increase pain. Continues wit DN    Personal Factors and Comorbidities  Comorbidity 3+    Comorbidities  arthritis, TKA's, CVA    Stability/Clinical Decision Making  Stable/Uncomplicated    Rehab Potential  Good    PT Frequency  2x / week    PT Treatment/Interventions  ADLs/Self Care Home Management;Traction;Ultrasound;Moist Heat;Electrical Stimulation;Manual techniques;Therapeutic exercise;Therapeutic activities;Patient/family education;Dry needling    PT Next Visit Plan  progress with ROM/stretching and postural strength       Patient will benefit from  skilled therapeutic intervention in order to improve the following deficits and impairments:  Pain, Improper body mechanics, Increased muscle spasms, Postural dysfunction, Decreased strength, Decreased range of motion  Visit Diagnosis: Cramp and spasm  Cervicalgia     Problem  List Patient Active Problem List   Diagnosis Date Noted  . Cervicalgia 07/29/2019  . Trigger finger, left middle finger 04/28/2018  . Hypothyroidism 03/29/2017  . Bilateral carotid artery stenosis 09/25/2016  . Generalized anxiety disorder 06/20/2015  . SUI (stress urinary incontinence, female) 02/05/2014  . 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 Cerebral artery occlusion with cerebral infarction ([place]) 05/31/2010  . DM (diabetes mellitus), secondary, uncontrolled, with renal complications ([place]) [id number]  . Dysuria 01/24/2010  . THYROID NODULE, RIGHT 01/21/2009  . Vitamin D deficiency 12/10/2007  . HYPERLIPIDEMIA 06/17/2007  . Essential hypertension 06/17/2007  . Osteopenia 06/17/2007  . Osteoarthrosis, unspecified whether generalized or localized, unspecified site 11/11/2006    Scot Jun 09/02/2019, 1:48 PM  [address] [place], [place], [address] Phone: [phone]   Fax:  [phone]  Name: HEATHERMARIE SCHISLER MRN: [id number] Date of Birth: 05/29/1941

## 2019-09-04 ENCOUNTER — Other Ambulatory Visit: Payer: Self-pay

## 2019-09-04 ENCOUNTER — Ambulatory Visit: Payer: Medicare Other | Admitting: Physical Therapy

## 2019-09-04 ENCOUNTER — Encounter: Payer: Self-pay | Admitting: Physical Therapy

## 2019-09-04 DIAGNOSIS — M542 Cervicalgia: Secondary | ICD-10-CM

## 2019-09-04 DIAGNOSIS — R252 Cramp and spasm: Secondary | ICD-10-CM | POA: Diagnosis not present

## 2019-09-04 NOTE — Therapy (Signed)
[address] [place], [place], [address] Phone: [phone]   Fax:  [phone]  Physical Therapy Treatment  Patient Details  Name: [name] MRN: [id number] Date of Birth: [birth year] Referring Provider (PT): Durward Fortes   Encounter Date: 09/04/2019  PT End of Session - 09/04/19 1054    Visit Number  5    Date for PT Re-Evaluation  10/17/19    PT Start Time  4503    PT Stop Time  1105    PT Time Calculation (min)  50 min    Activity Tolerance  Patient tolerated treatment well    Behavior During Therapy  [facility] for tasks assessed/performed       Past Medical History:  Diagnosis Date  . Anxiety   . Arthritis HANDS, NECK , BACK  . Asymptomatic carotid artery stenosis LEFT --  MOD. PER DR WILLIS NOTE OCT 2012  . Benign heart murmur   . Depression    PMH of  . Diabetes mellitus ORAL MED  . DVT (deep venous thrombosis) ([place]) 2006   after arthroscopy  . Frequency of urination   . History of CVA (cerebrovascular accident) NEUROLOGIST  DR WILLIS----  06-01-2010-  LEFT BASAL GANGLIA HEMORRAGE   RESIDUAL RIGHT HAND NUMBNESS/TINGLING  . Hyperlipidemia   . Hypertension   . Hypothyroid   . MRSA (methicillin resistant staph aureus) culture positive    X3; last post TKR  . Nocturia   . Numbness and tingling in right hand RESIDUAL FROM CVA NOV 2011  . Obesity    Redux therapy  . Osteoporosis   . Thyroid disease    right thyroid nodule-- BX DONE 08-15-2011    Past Surgical History:  Procedure Laterality Date  . ABDOMINAL HYSTERECTOMY  1973   Endometriosis & fibroid  . CARPAL TUNNEL RELEASE  2000   RIGHT  . COLONOSCOPY  1999 & 2005   Dr Olevia Perches  . cystoscope     to evaluate recurrent UTIs  . CYSTOSCOPY W/ RETROGRADES  08/22/2011   Procedure: CYSTOSCOPY WITH RETROGRADE PYELOGRAM;  Surgeon: Molli Hazard, MD;  Location: [facility];  Service: Urology;  Laterality: Bilateral;  .  CYSTOSCOPY WITH BIOPSY  08/22/2011   Procedure: CYSTOSCOPY WITH BIOPSY;  Surgeon: Molli Hazard, MD;  Location: [facility];  Service: Urology;  Laterality: N/A;  . g2 p2    . JOINT REPLACEMENT  03-28-2006   LEFT THUMB  . KNEE ARTHROSCOPY     BILATERAL PRIOR TO TOTAL KNEE  . LEFT SHOULDER SURG  1990'S  . LUMBAR LAMINECTOMY  08-26-2009   L 4 - 5  . THYROID NODULE BX  08-15-2011   RIGHT  . TOTAL KNEE ARTHROPLASTY  05-26-2002      LEFT   AND RIGHT  03-30-2098  . WRIST SURGERY  2014   tendon placed in joint; Dr Burney Gauze    There were no vitals filed for this visit.  Subjective Assessment - 09/04/19 1017    Subjective  "I am feeling ok"    Currently in Pain?  Yes    Pain Score  1     Pain Location  Scapula    Pain Orientation  Left;Right         [facility] PT Assessment - 09/04/19 0001      AROM   Overall AROM Comments  Cervical L rotation and sidebending decreased 25% all other motions [facility] Adult PT Treatment/Exercise - 09/04/19 0001      Neck Exercises: Machines for Strengthening   UBE (Upper Arm Bike)  L 2 3 min fwd/ 92mn backward      Neck Exercises: Standing   Neck Retraction  20 reps;3 secs      Shoulder Exercises: Standing   Horizontal ABduction  Strengthening;Both;Theraband;20 reps    Theraband Level (Shoulder Horizontal ABduction)  Level 2 (Red)    ABduction  Strengthening;Both;Theraband;20 reps    Shoulder ABduction Weight (lbs)  2    Extension  Strengthening;Both;Theraband;15 reps    Theraband Level (Shoulder Extension)  Level 3 (Green)    Row  Strengthening;Both;15 reps;Theraband    Theraband Level (Shoulder Row)  Level 3 (Green)      Moist Heat Therapy   Number Minutes Moist Heat  11 Minutes    Moist Heat Location  Cervical      Manual Therapy   Manual Therapy  Soft tissue mobilization;Passive ROM    Soft tissue mobilization  Cervical paraspinaled intonthe upper traps. Good tissue elasticity     Passive ROM  cerv PROM with end range stretch to tolerance, Bilateral Shoulde PROM in all directions               PT Short Term Goals - 08/19/19 1600      PT SHORT TERM GOAL #1   Title  independent iwth initial HEP    Time  2    Period  Weeks    Status  New        PT Long Term Goals - 09/04/19 1033      PT LONG TERM GOAL #1   Title  decrease HA's 50%    Status  Partially Met      PT LONG TERM GOAL #2   Title  decrease pain 50%    Status  Partially Met      PT LONG TERM GOAL #3   Title  increase cervical ROM 25%    Status  Partially Met      PT LONG TERM GOAL #4   Title  understand posture and body mechanics    Status  Partially Met            Plan - 09/04/19 1055    Clinical Impression Statement  Pt reports decrease pain overall. She has progressed towards all goals. She has also increase hr cervical ROM. Increase resistance tolerated with rows and lats. Postural cues needed with standing row. Added horizontal abduction without issue. Pt reports some bilateral scapular pain. PROM added to both Cues/ Some R shoulder tightness noted at the eng range of flexion. Positive response to STM    Personal Factors and Comorbidities  Comorbidity 3+    Comorbidities  arthritis, TKA's, CVA    Stability/Clinical Decision Making  Stable/Uncomplicated    Rehab Potential  Good    PT Frequency  2x / week    PT Duration  8 weeks    PT Treatment/Interventions  ADLs/Self Care Home Management;Traction;Ultrasound;Moist Heat;Electrical Stimulation;Manual techniques;Therapeutic exercise;Therapeutic activities;Patient/family education;Dry needling    PT Next Visit Plan  progress with ROM/stretching and postural strength       Patient will benefit from skilled therapeutic intervention in order to improve the following deficits and impairments:  Pain, Improper body mechanics, Increased muscle spasms, Postural dysfunction, Decreased strength, Decreased range of motion  Visit  Diagnosis: Cervicalgia  Cramp and spasm     Problem List Patient Active Problem List   Diagnosis Date Noted  .  Cervicalgia 07/29/2019  . Trigger finger, left middle finger 04/28/2018  . Hypothyroidism 03/29/2017  . Bilateral carotid artery stenosis 09/25/2016  . Generalized anxiety disorder 06/20/2015  . SUI (stress urinary incontinence, female) 02/05/2014  . 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 Cerebral artery occlusion with cerebral infarction ([place]) 05/31/2010  . DM (diabetes mellitus), secondary, uncontrolled, with renal complications ([place]) 41/99/1444  . Dysuria 01/24/2010  . THYROID NODULE, RIGHT 01/21/2009  . Vitamin D deficiency 12/10/2007  . HYPERLIPIDEMIA 06/17/2007  . Essential hypertension 06/17/2007  . Osteopenia 06/17/2007  . Osteoarthrosis, unspecified whether generalized or localized, unspecified site 11/11/2006    Scot Jun, PTA 09/04/2019, 10:57 AM  [place], [place], [address] Phone: [phone]   Fax:  [phone]  Name: [name] MRN: [id number] Date of Birth: 1940-12-31

## 2019-09-08 ENCOUNTER — Ambulatory Visit: Payer: Medicare Other | Admitting: Physical Therapy

## 2019-09-08 ENCOUNTER — Other Ambulatory Visit: Payer: Self-pay

## 2019-09-08 ENCOUNTER — Encounter: Payer: Self-pay | Admitting: Physical Therapy

## 2019-09-08 DIAGNOSIS — M542 Cervicalgia: Secondary | ICD-10-CM

## 2019-09-08 DIAGNOSIS — R252 Cramp and spasm: Secondary | ICD-10-CM | POA: Diagnosis not present

## 2019-09-08 NOTE — Therapy (Signed)
[place] [address] [place], [place], [address] Phone: [phone]   Fax:  [phone]  Physical Therapy Treatment  Patient Details  Name: [name] MRN: [id number] Date of Birth: 03-28-1941 Referring Provider (PT): Durward Fortes   Encounter Date: 09/08/2019  PT End of Session - 09/08/19 1135    Visit Number  6    Date for PT Re-Evaluation  10/17/19    PT Start Time  1100    PT Stop Time  1150    PT Time Calculation (min)  50 min    Activity Tolerance  Patient tolerated treatment well    Behavior During Therapy  [facility] for tasks assessed/performed       Past Medical History:  Diagnosis Date  . Anxiety   . Arthritis HANDS, NECK , BACK  . Asymptomatic carotid artery stenosis LEFT --  MOD. PER DR WILLIS NOTE OCT 2012  . Benign heart murmur   . Depression    PMH of  . Diabetes mellitus ORAL MED  . DVT (deep venous thrombosis) ([place]) 2006   after arthroscopy  . Frequency of urination   . History of CVA (cerebrovascular accident) NEUROLOGIST  DR WILLIS----  06-01-2010-  LEFT BASAL GANGLIA HEMORRAGE   RESIDUAL RIGHT HAND NUMBNESS/TINGLING  . Hyperlipidemia   . Hypertension   . Hypothyroid   . MRSA (methicillin resistant staph aureus) culture positive    X3; last post TKR  . Nocturia   . Numbness and tingling in right hand RESIDUAL FROM CVA NOV 2011  . Obesity    Redux therapy  . Osteoporosis   . Thyroid disease    right thyroid nodule-- BX DONE 08-15-2011    Past Surgical History:  Procedure Laterality Date  . ABDOMINAL HYSTERECTOMY  1973   Endometriosis & fibroid  . CARPAL TUNNEL RELEASE  2000   RIGHT  . COLONOSCOPY  1999 & 2005   Dr Olevia Perches  . cystoscope     to evaluate recurrent UTIs  . CYSTOSCOPY W/ RETROGRADES  08/22/2011   Procedure: CYSTOSCOPY WITH RETROGRADE PYELOGRAM;  Surgeon: Molli Hazard, MD;  Location: [facility];  Service: Urology;  Laterality: Bilateral;  .  CYSTOSCOPY WITH BIOPSY  08/22/2011   Procedure: CYSTOSCOPY WITH BIOPSY;  Surgeon: Molli Hazard, MD;  Location: [facility];  Service: Urology;  Laterality: N/A;  . g2 p2    . JOINT REPLACEMENT  03-28-2006   LEFT THUMB  . KNEE ARTHROSCOPY     BILATERAL PRIOR TO TOTAL KNEE  . LEFT SHOULDER SURG  1990'S  . LUMBAR LAMINECTOMY  08-26-2009   L 4 - 5  . THYROID NODULE BX  08-15-2011   RIGHT  . TOTAL KNEE ARTHROPLASTY  05-26-2002      LEFT   AND RIGHT  03-30-2098  . WRIST SURGERY  2014   tendon placed in joint; Dr Burney Gauze    There were no vitals filed for this visit.  Subjective Assessment - 09/08/19 1104    Subjective  Neck is ok    Pertinent History  CVA, Total joint replacements    Currently in Pain?  No/denies                       [facility] Adult PT Treatment/Exercise - 09/08/19 0001      Neck Exercises: Machines for Strengthening   UBE (Upper Arm Bike)  L 2 3 min fwd/ 51mn backward  Cybex Row  20lb 2x10     Lat Pull  20lb 2x10       Neck Exercises: Standing   Neck Retraction  20 reps;3 secs      Shoulder Exercises: Standing   Horizontal ABduction  Strengthening;Both;Theraband;20 reps    Theraband Level (Shoulder Horizontal ABduction)  Level 2 (Red)    Other Standing Exercises  shrugs 5lb 2x10       Moist Heat Therapy   Number Minutes Moist Heat  12 Minutes    Moist Heat Location  Cervical      Manual Therapy   Manual Therapy  Soft tissue mobilization;Passive ROM;Manual Traction    Soft tissue mobilization  Cervical paraspinaled intonthe upper traps. Good tissue elasticity    Passive ROM  cerv PROM with end range stretch to tolerance, Bilateral Shoulde PROM in all directions    Manual Traction  cervical traction 4x10 seconds               PT Short Term Goals - 08/19/19 1600      PT SHORT TERM GOAL #1   Title  independent iwth initial HEP    Time  2    Period  Weeks    Status  New        PT Long Term Goals -  09/04/19 1033      PT LONG TERM GOAL #1   Title  decrease HA's 50%    Status  Partially Met      PT LONG TERM GOAL #2   Title  decrease pain 50%    Status  Partially Met      PT LONG TERM GOAL #3   Title  increase cervical ROM 25%    Status  Partially Met      PT LONG TERM GOAL #4   Title  understand posture and body mechanics    Status  Partially Met            Plan - 09/08/19 1136    Clinical Impression Statement  No issue reported with today's treatment. Went back to come machine level exercise with increased resistance, cues not to sway trunk with seated rows. Tactile cues to keep elbows to her side with external rotation. No reports of pain. Some tightness with cervical side bending noted with MT. She did report some upper trap tenderness with STM.    Personal Factors and Comorbidities  Comorbidity 3+    Comorbidities  arthritis, TKA's, CVA    Stability/Clinical Decision Making  Stable/Uncomplicated    Rehab Potential  Good    PT Duration  8 weeks    PT Treatment/Interventions  ADLs/Self Care Home Management;Traction;Ultrasound;Moist Heat;Electrical Stimulation;Manual techniques;Therapeutic exercise;Therapeutic activities;Patient/family education;Dry needling    PT Next Visit Plan  progress with ROM/stretching and postural strength       Patient will benefit from skilled therapeutic intervention in order to improve the following deficits and impairments:  Pain, Improper body mechanics, Increased muscle spasms, Postural dysfunction, Decreased strength, Decreased range of motion  Visit Diagnosis: Cervicalgia     Problem List Patient Active Problem List   Diagnosis Date Noted  . Cervicalgia 07/29/2019  . Trigger finger, left middle finger 04/28/2018  . Hypothyroidism 03/29/2017  . Bilateral carotid artery stenosis 09/25/2016  . Generalized anxiety disorder 06/20/2015  . SUI (stress urinary incontinence, female) 02/05/2014  . Recto-bladder neck fistula  07/29/2013  . Cerebral artery occlusion with cerebral infarction ([place]) 05/31/2010  . DM (diabetes mellitus), secondary, uncontrolled, with renal complications ([place])  01/24/2010  . Dysuria 01/24/2010  . THYROID NODULE, RIGHT 01/21/2009  . Vitamin D deficiency 12/10/2007  . HYPERLIPIDEMIA 06/17/2007  . Essential hypertension 06/17/2007  . Osteopenia 06/17/2007  . Osteoarthrosis, unspecified whether generalized or localized, unspecified site 11/11/2006    Scot Jun, PTA 09/08/2019, 11:39 AM  [place], [place], [address] Phone: [phone]   Fax:  [phone]  Name: [name] MRN: [id number] Date of Birth: 1940/08/30

## 2019-09-10 ENCOUNTER — Ambulatory Visit: Payer: Medicare Other | Admitting: Physical Therapy

## 2019-09-11 ENCOUNTER — Encounter: Payer: Self-pay | Admitting: Internal Medicine

## 2019-09-11 ENCOUNTER — Ambulatory Visit (INDEPENDENT_AMBULATORY_CARE_PROVIDER_SITE_OTHER): Payer: Medicare Other | Admitting: Internal Medicine

## 2019-09-11 ENCOUNTER — Other Ambulatory Visit: Payer: Self-pay

## 2019-09-11 VITALS — BP 140/76 | HR 74 | Temp 97.9°F | Ht 66.0 in | Wt 173.0 lb

## 2019-09-11 DIAGNOSIS — F411 Generalized anxiety disorder: Secondary | ICD-10-CM

## 2019-09-11 DIAGNOSIS — E782 Mixed hyperlipidemia: Secondary | ICD-10-CM | POA: Diagnosis not present

## 2019-09-11 DIAGNOSIS — E1329 Other specified diabetes mellitus with other diabetic kidney complication: Secondary | ICD-10-CM | POA: Diagnosis not present

## 2019-09-11 DIAGNOSIS — IMO0002 Reserved for concepts with insufficient information to code with codable children: Secondary | ICD-10-CM

## 2019-09-11 DIAGNOSIS — E1365 Other specified diabetes mellitus with hyperglycemia: Secondary | ICD-10-CM | POA: Diagnosis not present

## 2019-09-11 DIAGNOSIS — M8589 Other specified disorders of bone density and structure, multiple sites: Secondary | ICD-10-CM

## 2019-09-11 DIAGNOSIS — I10 Essential (primary) hypertension: Secondary | ICD-10-CM | POA: Diagnosis not present

## 2019-09-11 DIAGNOSIS — E039 Hypothyroidism, unspecified: Secondary | ICD-10-CM | POA: Diagnosis not present

## 2019-09-11 LAB — COMPREHENSIVE METABOLIC PANEL
ALT: 12 U/L (ref 0–35)
AST: 16 U/L (ref 0–37)
Albumin: 4.2 g/dL (ref 3.5–5.2)
Alkaline Phosphatase: 37 U/L — ABNORMAL LOW (ref 39–117)
BUN: 17 mg/dL (ref 6–23)
CO2: 28 mEq/L (ref 19–32)
Calcium: 9.8 mg/dL (ref 8.4–10.5)
Chloride: 102 mEq/L (ref 96–112)
Creatinine, Ser: 0.8 mg/dL (ref 0.40–1.20)
GFR: 68.83 mL/min (ref >60.00)
Glucose, Bld: 218 mg/dL — ABNORMAL HIGH (ref 70–99)
Potassium: 4 mEq/L (ref 3.5–5.1)
Sodium: 137 mEq/L (ref 135–145)
Total Bilirubin: 0.6 mg/dL (ref 0.2–1.2)
Total Protein: 6.8 g/dL (ref 6.0–8.3)

## 2019-09-11 LAB — TSH: TSH: 2.76 u[IU]/mL (ref 0.35–4.50)

## 2019-09-11 LAB — LIPID PANEL
Cholesterol: 126 mg/dL (ref 0–200)
HDL: 47.5 mg/dL (ref >39.00)
LDL Cholesterol: 51 mg/dL (ref 0–99)
NonHDL: 78.86
Total CHOL/HDL Ratio: 3
Triglycerides: 139 mg/dL (ref 0.0–149.0)
VLDL: 27.8 mg/dL (ref 0.0–40.0)

## 2019-09-11 LAB — HEMOGLOBIN A1C: Hgb A1c MFr Bld: 6.3 % (ref 4.6–6.5)

## 2019-09-11 NOTE — Progress Notes (Signed)
Subjective:    Patient ID: [name], female    DOB: [birth year], 79 y.o.   MRN: [id number]  HPI She is here for follow up of her chronic medical conditions.   She is taking all of her medications as prescribed.   She is exercising regularly - walking.    Hypertension: She is taking her medication daily. She is compliant with a low sodium diet.     Diabetes: She is taking her medication daily as prescribed. She is compliant with a diabetic diet.  She denies numbness/tingling in her feet and foot lesions. She is up-to-date with an ophthalmology examination.  Her sugars in the morning are 150-170.    Hypothyroidism:  She is taking her medication daily.  She denies any recent changes in energy or weight that are unexplained.   Hyperlipidemia: She is taking her medication daily. She is compliant with a low fat/cholesterol diet. She denies myalgias.   Anxiety: She is taking her medication daily as prescribed. She denies any side effects from the medication. She feels her anxiety is well controlled and she is happy with her current dose of medication.     Medications and allergies reviewed with patient and updated if appropriate.  Patient Active Problem List   Diagnosis Date Noted  . Cervicalgia 07/29/2019  . Trigger finger, left middle finger 04/28/2018  . Hypothyroidism 03/29/2017  . Bilateral carotid artery stenosis 09/25/2016  . Generalized anxiety disorder 06/20/2015  . SUI (stress urinary incontinence, female) 02/05/2014  . 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 Cerebral artery occlusion with cerebral infarction ([place]) 05/31/2010  . DM (diabetes mellitus), secondary, uncontrolled, with renal complications ([place]) [id number]  . Dysuria 01/24/2010  . THYROID NODULE, RIGHT 01/21/2009  . Vitamin D deficiency 12/10/2007  . HYPERLIPIDEMIA 06/17/2007  . Essential hypertension 06/17/2007  . Osteopenia 06/17/2007  . Osteoarthrosis, unspecified whether generalized or localized,  unspecified site 11/11/2006    Current Outpatient Medications on File Prior to Visit  Medication Sig Dispense Refill  . aspirin 81 MG tablet Take 81 mg by mouth at bedtime.     . benazepril (LOTENSIN) 20 MG tablet TAKE 1 TABLET DAILY 90 tablet 3  . calcium citrate-vitamin D 500-400 MG-UNIT chewable tablet Chew 1 tablet by mouth at bedtime.     . Cholecalciferol (VITAMIN D3) 1000 UNITS CAPS Take 1 capsule by mouth daily.    . clonazePAM (KLONOPIN) 0.5 MG tablet TAKE 1 TABLET DAILY AS NEEDED FOR ANXIETY 90 tablet 1  . CRANBERRY PO Take 300 mg by mouth daily.    . cycloSPORINE (RESTASIS) 0.05 % ophthalmic emulsion Place 1 drop into both eyes 2 (two) times daily.    Marland Kitchen escitalopram (LEXAPRO) 10 MG tablet TAKE 1 TABLET DAILY 90 tablet 0  . glimepiride (AMARYL) 2 MG tablet TAKE 1 TABLET TWICE A DAY 180 tablet 0  . glucose blood (FREESTYLE LITE) test strip USE TO CHECK BLOOD SUGAR DAILY AS DIRECTED 100 each 2  . JANUVIA 100 MG tablet TAKE 1 TABLET DAILY 90 tablet 3  . Lancets (FREESTYLE) lancets Use as instructed 100 each 2  . levothyroxine (SYNTHROID) 25 MCG tablet TAKE 1 TABLET DAILY BEFORE BREAKFAST 90 tablet 3  . metFORMIN (GLUCOPHAGE-XR) 500 MG 24 hr tablet 1000 mg with breakfast and 500 mg with dinner 270 tablet 1  . methylcellulose (ARTIFICIAL TEARS) 1 % ophthalmic solution Place 1 drop into both eyes daily as needed (itching eyes).     . Multiple Vitamins-Calcium (ONE-A-DAY WOMENS FORMULA) TABS  Take 1 tablet by mouth daily.    . pravastatin (PRAVACHOL) 40 MG tablet TAKE 1 TABLET AT BEDTIME 90 tablet 1  . TRULICITY [id number] SOPN INJECT 0.75 MG UNDER THE SKIN ONCE A WEEK 4 mL 5   No current facility-administered medications on file prior to visit.    Past Medical History:  Diagnosis Date  . Anxiety   . Arthritis HANDS, NECK , BACK  . Asymptomatic carotid artery stenosis LEFT --  MOD. PER DR WILLIS NOTE OCT 2012  . Benign heart murmur   . Depression    PMH of  . Diabetes mellitus  ORAL MED  . DVT (deep venous thrombosis) ([place]) 2006   after arthroscopy  . Frequency of urination   . History of CVA (cerebrovascular accident) NEUROLOGIST  DR WILLIS----  06-01-2010-  LEFT BASAL GANGLIA HEMORRAGE   RESIDUAL RIGHT HAND NUMBNESS/TINGLING  . Hyperlipidemia   . Hypertension   . Hypothyroid   . MRSA (methicillin resistant staph aureus) culture positive    X3; last post TKR  . Nocturia   . Numbness and tingling in right hand RESIDUAL FROM CVA NOV 2011  . Obesity    Redux therapy  . Osteoporosis   . Thyroid disease    right thyroid nodule-- BX DONE 08-15-2011    Past Surgical History:  Procedure Laterality Date  . ABDOMINAL HYSTERECTOMY  1973   Endometriosis & fibroid  . CARPAL TUNNEL RELEASE  2000   RIGHT  . COLONOSCOPY  1999 & 2005   Dr Olevia Perches  . cystoscope     to evaluate recurrent UTIs  . CYSTOSCOPY W/ RETROGRADES  08/22/2011   Procedure: CYSTOSCOPY WITH RETROGRADE PYELOGRAM;  Surgeon: Molli Hazard, MD;  Location: [facility];  Service: Urology;  Laterality: Bilateral;  . CYSTOSCOPY WITH BIOPSY  08/22/2011   Procedure: CYSTOSCOPY WITH BIOPSY;  Surgeon: Molli Hazard, MD;  Location: [facility];  Service: Urology;  Laterality: N/A;  . g2 p2    . JOINT REPLACEMENT  03-28-2006   LEFT THUMB  . KNEE ARTHROSCOPY     BILATERAL PRIOR TO TOTAL KNEE  . LEFT SHOULDER SURG  1990'S  . LUMBAR LAMINECTOMY  08-26-2009   L 4 - 5  . THYROID NODULE BX  08-15-2011   RIGHT  . TOTAL KNEE ARTHROPLASTY  05-26-2002      LEFT   AND RIGHT  03-30-2098  . WRIST SURGERY  2014   tendon placed in joint; Dr Burney Gauze    Social History   Socioeconomic History  . Marital status: Widowed    Spouse name: Not on file  . Number of children: 2  . Years of education: Not on file  . Highest education level: Not on file  Occupational History  . Not on file  Tobacco Use  . Smoking status: Never Smoker  . Smokeless tobacco: Never Used    Substance and Sexual Activity  . Alcohol use: Yes    Comment:  very rarely , < 1 glass wine / month  . Drug use: No  . Sexual activity: Not Currently  Other Topics Concern  . Not on file  Social History Narrative  . Not on file   Social Determinants of Health   Financial Resource Strain:   . Difficulty of Paying Living Expenses: Not on file  Food Insecurity:   . Worried About Charity fundraiser in the Last Year: Not on file  . Ran Out of Food in the Last  Year: Not on file  Transportation Needs:   . Lack of Transportation (Medical): Not on file  . Lack of Transportation (Non-Medical): Not on file  Physical Activity:   . Days of Exercise per Week: Not on file  . Minutes of Exercise per Session: Not on file  Stress:   . Feeling of Stress : Not on file  Social Connections:   . Frequency of Communication with Friends and Family: Not on file  . Frequency of Social Gatherings with Friends and Family: Not on file  . Attends Religious Services: Not on file  . Active Member of Clubs or Organizations: Not on file  . Attends Archivist Meetings: Not on file  . Marital Status: Not on file    Family History  Problem Relation Age of Onset  . Bladder Cancer Mother   . Diabetes Mother   . Brain cancer Sister   . Stroke Paternal Grandfather        in 54s  . Diabetes Sister 101       TYPE 1   . Prostate cancer Brother   . Alzheimer's disease Brother   . Diabetes Maternal Aunt   . Diabetes Maternal Uncle   . Diabetes Maternal Grandmother        Type 2  . Diabetes Maternal Grandfather        Type 2  . Pancreatic cancer Sister   . Kidney failure Sister        in context of DM & influenza  . Heart disease Neg Hx     Review of Systems  Constitutional: Negative for chills and fever.  Respiratory: Negative for cough, shortness of breath and wheezing.   Cardiovascular: Negative for chest pain, palpitations and leg swelling.  Neurological: Positive for dizziness (occ) and  headaches (related to neck pain - doing PT).       Objective:   Vitals:   09/11/19 1306  BP: 140/76  Pulse: 74  Temp: 97.9 F (36.6 C)  SpO2: 97%   BP Readings from Last 3 Encounters:  09/11/19 140/76  03/10/19 (!) 154/78  06/30/18 122/64   Wt Readings from Last 3 Encounters:  09/11/19 173 lb (78.5 kg)  07/29/19 163 lb (73.9 kg)  03/10/19 175 lb (79.4 kg)   Body mass index is 27.92 kg/m.   Physical Exam    Constitutional: Appears well-developed and well-nourished. No distress.  HENT:  Head: Normocephalic and atraumatic.  Neck: Neck supple. No tracheal deviation present. No thyromegaly present.  No cervical lymphadenopathy Cardiovascular: Normal rate, regular rhythm and normal heart sounds.   No murmur heard. No carotid bruit .  No edema Pulmonary/Chest: Effort normal and breath sounds normal. No respiratory distress. No has no wheezes. No rales.  Skin: Skin is warm and dry. Not diaphoretic.  Psychiatric: Normal mood and affect. Behavior is normal.      Assessment & Plan:    See Problem List for Assessment and Plan of chronic medical problems.    This visit occurred during the SARS-CoV-2 public health emergency.  Safety protocols were in place, including screening questions prior to the visit, additional usage of staff PPE, and extensive cleaning of exam room while observing appropriate contact time as indicated for disinfecting solutions.

## 2019-09-11 NOTE — Assessment & Plan Note (Signed)
Chronic Check a1c today Continue current medications - depending on a1c will increase trulicity and dec glimepiride Will increase exercise as weather gets nicer Fu in 6 months

## 2019-09-11 NOTE — Assessment & Plan Note (Signed)
Chronic Due for dexa - ordered Taking calcium and vitamin d Exercising some, but not regularly -- will increase walking as weather improves

## 2019-09-11 NOTE — Progress Notes (Deleted)
Virtual Visit via Video Note  I connected with [name] on 09/11/19 at 11:00 AM EST by a video enabled telemedicine application and verified that I am speaking with the correct person using two identifiers.   I discussed the limitations of evaluation and management by telemedicine and the availability of in person appointments. The patient expressed understanding and agreed to proceed.  Present for the visit:  Myself, Dr Billey Gosling, Daine Floras.  The patient is currently at home and I am in the office.    No referring provider.    History of Present Illness: She is here for follow up of her chronic medical conditions.   She is taking all of her medications as prescribed.   She is exercising regularly.    Hypertension: She is taking her medication daily. She is compliant with a low sodium diet.  She denies chest pain, palpitations, edema, shortness of breath and regular headaches. She does not monitor her blood pressure at home.    Diabetes: She is taking her medication daily as prescribed. She is compliant with a diabetic diet.  She denies numbness/tingling in her feet and foot lesions. She is up-to-date with an ophthalmology examination.   Hypothyroidism:  She is taking her medication daily.  She denies any recent changes in energy or weight that are unexplained.   Hyperlipidemia: She is taking her medication daily. She is compliant with a low fat/cholesterol diet. She denies myalgias.   Anxiety: She is taking her medication daily as prescribed. She denies any side effects from the medication. She feels her anxiety is well controlled and she is happy with her current dose of medication.    ROS    Social History   Socioeconomic History  . Marital status: Widowed    Spouse name: Not on file  . Number of children: 2  . Years of education: Not on file  . Highest education level: Not on file  Occupational History  . Not on file  Tobacco Use  . Smoking status: Never Smoker   . Smokeless tobacco: Never Used  Substance and Sexual Activity  . Alcohol use: Yes    Comment:  very rarely , < 1 glass wine / month  . Drug use: No  . Sexual activity: Not Currently  Other Topics Concern  . Not on file  Social History Narrative  . Not on file   Social Determinants of Health   Financial Resource Strain:   . Difficulty of Paying Living Expenses: Not on file  Food Insecurity:   . Worried About Charity fundraiser in the Last Year: Not on file  . Ran Out of Food in the Last Year: Not on file  Transportation Needs:   . Lack of Transportation (Medical): Not on file  . Lack of Transportation (Non-Medical): Not on file  Physical Activity:   . Days of Exercise per Week: Not on file  . Minutes of Exercise per Session: Not on file  Stress:   . Feeling of Stress : Not on file  Social Connections:   . Frequency of Communication with Friends and Family: Not on file  . Frequency of Social Gatherings with Friends and Family: Not on file  . Attends Religious Services: Not on file  . Active Member of Clubs or Organizations: Not on file  . Attends Archivist Meetings: Not on file  . Marital Status: Not on file     Observations/Objective: Appears well in NAD Breathing normally Skin appears  warm and dry Mood and affect are normal.  Assessment and Plan:  See Problem List for Assessment and Plan of chronic medical problems.   Follow Up Instructions:    I discussed the assessment and treatment plan with the patient. The patient was provided an opportunity to ask questions and all were answered. The patient agreed with the plan and demonstrated an understanding of the instructions.   The patient was advised to call back or seek an in-person evaluation if the symptoms worsen or if the condition fails to improve as anticipated.   FU in 6 months  Binnie Rail, MD

## 2019-09-11 NOTE — Assessment & Plan Note (Signed)
Chronic Check lipid panel  Continue daily statin Regular exercise and healthy diet encouraged  

## 2019-09-11 NOTE — Patient Instructions (Addendum)
  Blood work was ordered.   ° ° °Medications reviewed and updated.  Changes include :   none ° ° ° °Please followup in 6 months ° ° °

## 2019-09-11 NOTE — Assessment & Plan Note (Signed)
Chronic BP controlled Current regimen effective and well tolerated Continue current medications at current doses cmp  

## 2019-09-11 NOTE — Assessment & Plan Note (Signed)
Chronic Controlled, stable Continue current dose of medication - lexparo 10 mg daily and clonazepam daily prn

## 2019-09-11 NOTE — Assessment & Plan Note (Signed)
Chronic  Clinically euthyroid Check tsh  Titrate med dose if needed  

## 2019-09-13 ENCOUNTER — Encounter: Payer: Self-pay | Admitting: Internal Medicine

## 2019-09-15 ENCOUNTER — Encounter: Payer: Self-pay | Admitting: Internal Medicine

## 2019-09-15 ENCOUNTER — Ambulatory Visit: Payer: Medicare Other | Admitting: Physical Therapy

## 2019-09-15 ENCOUNTER — Other Ambulatory Visit: Payer: Self-pay

## 2019-09-15 ENCOUNTER — Encounter: Payer: Self-pay | Admitting: Physical Therapy

## 2019-09-15 DIAGNOSIS — R252 Cramp and spasm: Secondary | ICD-10-CM

## 2019-09-15 DIAGNOSIS — M542 Cervicalgia: Secondary | ICD-10-CM | POA: Diagnosis not present

## 2019-09-15 NOTE — Therapy (Signed)
[place] [place], [place], [address] Phone: [phone]   Fax:  [phone]  Physical Therapy Treatment  Patient Details  Name: [name] MRN: [id number] Date of Birth: 04/04/1941 Referring Provider (PT): Durward Fortes   Encounter Date: 09/15/2019  PT End of Session - 09/15/19 1222    Visit Number  7    Date for PT Re-Evaluation  10/17/19    PT Start Time  8841    PT Stop Time  1235    PT Time Calculation (min)  50 min    Activity Tolerance  Patient tolerated treatment well    Behavior During Therapy  [facility] for tasks assessed/performed       Past Medical History:  Diagnosis Date  . Anxiety   . Arthritis HANDS, NECK , BACK  . Asymptomatic carotid artery stenosis LEFT --  MOD. PER DR WILLIS NOTE OCT 2012  . Benign heart murmur   . Depression    PMH of  . Diabetes mellitus ORAL MED  . DVT (deep venous thrombosis) ([place]) 2006   after arthroscopy  . Frequency of urination   . History of CVA (cerebrovascular accident) NEUROLOGIST  DR WILLIS----  06-01-2010-  LEFT BASAL GANGLIA HEMORRAGE   RESIDUAL RIGHT HAND NUMBNESS/TINGLING  . Hyperlipidemia   . Hypertension   . Hypothyroid   . MRSA (methicillin resistant staph aureus) culture positive    X3; last post TKR  . Nocturia   . Numbness and tingling in right hand RESIDUAL FROM CVA NOV 2011  . Obesity    Redux therapy  . Osteoporosis   . Thyroid disease    right thyroid nodule-- BX DONE 08-15-2011    Past Surgical History:  Procedure Laterality Date  . ABDOMINAL HYSTERECTOMY  1973   Endometriosis & fibroid  . CARPAL TUNNEL RELEASE  2000   RIGHT  . COLONOSCOPY  1999 & 2005   Dr Olevia Perches  . cystoscope     to evaluate recurrent UTIs  . CYSTOSCOPY W/ RETROGRADES  08/22/2011   Procedure: CYSTOSCOPY WITH RETROGRADE PYELOGRAM;  Surgeon: Molli Hazard, MD;  Location: [facility];  Service: Urology;  Laterality: Bilateral;  .  CYSTOSCOPY WITH BIOPSY  08/22/2011   Procedure: CYSTOSCOPY WITH BIOPSY;  Surgeon: Molli Hazard, MD;  Location: [facility];  Service: Urology;  Laterality: N/A;  . g2 p2    . JOINT REPLACEMENT  03-28-2006   LEFT THUMB  . KNEE ARTHROSCOPY     BILATERAL PRIOR TO TOTAL KNEE  . LEFT SHOULDER SURG  1990'S  . LUMBAR LAMINECTOMY  08-26-2009   L 4 - 5  . THYROID NODULE BX  08-15-2011   RIGHT  . TOTAL KNEE ARTHROPLASTY  05-26-2002      LEFT   AND RIGHT  03-30-2098  . WRIST SURGERY  2014   tendon placed in joint; Dr Burney Gauze    There were no vitals filed for this visit.  Subjective Assessment - 09/15/19 1149    Subjective  Feeling good, no pain. Some neck pain she turns to the right.    Currently in Pain?  Yes    Pain Score  1     Pain Location  --   across shoulders and back                      [facility] Adult PT Treatment/Exercise - 09/15/19 0001      Neck Exercises: Machines  for Strengthening   UBE (Upper Arm Bike)  L 2 3 min fwd/ 95mn backward    Cybex Row  20lb 2x15    Lat Pull  20lb 2x15      Neck Exercises: Standing   Other Standing Exercises  Shouldr flex 3lb & abd 2lb 2x10       Neck Exercises: Seated   Neck Retraction  15 reps;3 secs   x2     Shoulder Exercises: Standing   Horizontal ABduction  Strengthening;Both;Theraband;20 reps    Theraband Level (Shoulder Horizontal ABduction)  Level 2 (Red)    External Rotation  Strengthening;Both;Theraband;15 reps    Theraband Level (Shoulder External Rotation)  Level 2 (Red)      Moist Heat Therapy   Number Minutes Moist Heat  12 Minutes    Moist Heat Location  Cervical               PT Short Term Goals - 08/19/19 1600      PT SHORT TERM GOAL #1   Title  independent iwth initial HEP    Time  2    Period  Weeks    Status  New        PT Long Term Goals - 09/04/19 1033      PT LONG TERM GOAL #1   Title  decrease HA's 50%    Status  Partially Met      PT LONG TERM  GOAL #2   Title  decrease pain 50%    Status  Partially Met      PT LONG TERM GOAL #3   Title  increase cervical ROM 25%    Status  Partially Met      PT LONG TERM GOAL #4   Title  understand posture and body mechanics    Status  Partially Met            Plan - 09/15/19 1223    Clinical Impression Statement  Pt did well with today's activities. Increase the reps with seated rows and lats. She reports improvement overall but with some L sided cervical pain with R cervical rotation. She did well keeping shoulders down with flexion and abd. Positive response to modality.    Personal Factors and Comorbidities  Comorbidity 3+    Comorbidities  arthritis, TKA's, CVA    Stability/Clinical Decision Making  Stable/Uncomplicated    Rehab Potential  Good    PT Frequency  2x / week    PT Duration  8 weeks    PT Treatment/Interventions  ADLs/Self Care Home Management;Traction;Ultrasound;Moist Heat;Electrical Stimulation;Manual techniques;Therapeutic exercise;Therapeutic activities;Patient/family education;Dry needling    PT Next Visit Plan  progress with ROM/stretching and postural strength       Patient will benefit from skilled therapeutic intervention in order to improve the following deficits and impairments:  Pain, Improper body mechanics, Increased muscle spasms, Postural dysfunction, Decreased strength, Decreased range of motion  Visit Diagnosis: Cramp and spasm  Cervicalgia     Problem List Patient Active Problem List   Diagnosis Date Noted  . Cervicalgia 07/29/2019  . Trigger finger, left middle finger 04/28/2018  . Hypothyroidism 03/29/2017  . Bilateral carotid artery stenosis 09/25/2016  . Generalized anxiety disorder 06/20/2015  . SUI (stress urinary incontinence, female) 02/05/2014  . 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 [place] 05/31/2010  . DM (diabetes mellitus), secondary, uncontrolled, with renal  complications ([place] [id number] . Dysuria 01/24/2010  . THYROID NODULE, RIGHT 01/21/2009  .  Vitamin D deficiency 12/10/2007  . HYPERLIPIDEMIA 06/17/2007  . Essential hypertension 06/17/2007  . Osteopenia 06/17/2007  . Osteoarthrosis, unspecified whether generalized or localized, unspecified site 11/11/2006    Scot Jun 09/15/2019, 12:26 PM  [place], [place], [address] Phone: [phone]   Fax:  [phone]  Name: [name] MRN: [id number] Date of Birth: Dec 18, 1940

## 2019-09-16 ENCOUNTER — Ambulatory Visit (INDEPENDENT_AMBULATORY_CARE_PROVIDER_SITE_OTHER)
Admission: RE | Admit: 2019-09-16 | Discharge: 2019-09-16 | Disposition: A | Discharge status: Home / Self Care | Payer: Medicare Other | Source: Ambulatory Visit | Attending: Internal Medicine | Admitting: Internal Medicine

## 2019-09-16 DIAGNOSIS — M8589 Other specified disorders of bone density and structure, multiple sites: Secondary | ICD-10-CM | POA: Diagnosis not present

## 2019-09-17 ENCOUNTER — Ambulatory Visit: Payer: Medicare Other | Admitting: Physical Therapy

## 2019-09-17 ENCOUNTER — Other Ambulatory Visit: Payer: Self-pay

## 2019-09-17 ENCOUNTER — Encounter: Payer: Self-pay | Admitting: Physical Therapy

## 2019-09-17 DIAGNOSIS — R252 Cramp and spasm: Secondary | ICD-10-CM

## 2019-09-17 DIAGNOSIS — M542 Cervicalgia: Secondary | ICD-10-CM

## 2019-09-17 MED ORDER — TRULICITY 1.5 MG/0.5ML ~~LOC~~ SOAJ: 1.5000 mg | SUBCUTANEOUS | 3 refills | Status: DC

## 2019-09-17 NOTE — Therapy (Signed)
[place], [place], [address] Phone: [phone]   Fax:  [phone]  Physical Therapy Treatment  Patient Details  Name: [name] MRN: [id number] Date of Birth: 18-Nov-1940 Referring Provider (PT): Durward Fortes   Encounter Date: 09/17/2019    Past Medical History:  Diagnosis Date  . Anxiety   . Arthritis HANDS, NECK , BACK  . Asymptomatic carotid artery stenosis LEFT --  MOD. PER DR WILLIS NOTE OCT 2012  . Benign heart murmur   . Depression    PMH of  . Diabetes mellitus ORAL MED  . DVT (deep venous thrombosis) ([place]) 2006   after arthroscopy  . Frequency of urination   . History of CVA (cerebrovascular accident) NEUROLOGIST  DR WILLIS----  06-01-2010-  LEFT BASAL GANGLIA HEMORRAGE   RESIDUAL RIGHT HAND NUMBNESS/TINGLING  . Hyperlipidemia   . Hypertension   . Hypothyroid   . MRSA (methicillin resistant staph aureus) culture positive    X3; last post TKR  . Nocturia   . Numbness and tingling in right hand RESIDUAL FROM CVA NOV 2011  . Obesity    Redux therapy  . Osteoporosis   . Thyroid disease    right thyroid nodule-- BX DONE 08-15-2011    Past Surgical History:  Procedure Laterality Date  . ABDOMINAL HYSTERECTOMY  1973   Endometriosis & fibroid  . CARPAL TUNNEL RELEASE  2000   RIGHT  . COLONOSCOPY  1999 & 2005   Dr Olevia Perches  . cystoscope     to evaluate recurrent UTIs  . CYSTOSCOPY W/ RETROGRADES  08/22/2011   Procedure: CYSTOSCOPY WITH RETROGRADE PYELOGRAM;  Surgeon: Molli Hazard, MD;  Location: [facility];  Service: Urology;  Laterality: Bilateral;  . CYSTOSCOPY WITH BIOPSY  08/22/2011   Procedure: CYSTOSCOPY WITH BIOPSY;  Surgeon: Molli Hazard, MD;  Location: [facility];  Service: Urology;  Laterality: N/A;  . g2 p2    . JOINT REPLACEMENT  03-28-2006   LEFT THUMB  . KNEE ARTHROSCOPY     BILATERAL PRIOR TO TOTAL KNEE  .  LEFT SHOULDER SURG  1990'S  . LUMBAR LAMINECTOMY  08-26-2009   L 4 - 5  . THYROID NODULE BX  08-15-2011   RIGHT  . TOTAL KNEE ARTHROPLASTY  05-26-2002      LEFT   AND RIGHT  03-30-2098  . WRIST SURGERY  2014   tendon placed in joint; Dr Burney Gauze    There were no vitals filed for this visit.  Subjective Assessment - 09/17/19 1017    Subjective  "I got a sore neck, I raked some leaved yesterday and shouldn't have"    Currently in Pain?  No/denies                       [facility] Adult PT Treatment/Exercise - 09/17/19 0001      Neck Exercises: Machines for Strengthening   UBE (Upper Arm Bike)  L 3 2 min fwd/ 2 min backward    Nustep  L4 x 5 min       Neck Exercises: Standing   Neck Retraction  3 secs;20 reps   one set against ball   Other Standing Exercises  Shouldr flex 3lb & abd 2lb 2x10       Shoulder Exercises: Standing   External Rotation  Strengthening;Both;Theraband;15 reps    Theraband Level (Shoulder External Rotation)  Level 2 (Red)  Other Standing Exercises  shrugs 5lb 2x10     Other Standing Exercises  Standing ext 5lb 2x10      Moist Heat Therapy   Number Minutes Moist Heat  12 Minutes    Moist Heat Location  Cervical      Manual Therapy   Manual Therapy  Soft tissue mobilization;Passive ROM;Manual Traction    Soft tissue mobilization  Cervical paraspinaled intonthe upper traps. Good tissue elasticity    Passive ROM  cerv PROM with end range stretch to tolerance, Bilateral Shoulde PROM in all directions    Manual Traction  cervical traction 4x10 seconds               PT Short Term Goals - 08/19/19 1600      PT SHORT TERM GOAL #1   Title  independent iwth initial HEP    Time  2    Period  Weeks    Status  New        PT Long Term Goals - 09/17/19 1102      PT LONG TERM GOAL #1   Title  decrease HA's 50%      PT LONG TERM GOAL #2   Title  decrease pain 50%    Status  Partially Met      PT LONG TERM GOAL #3   Title   increase cervical ROM 25%    Status  Partially Met            Plan - 09/17/19 1104    Clinical Impression Statement  Pt enters clinic reporting increase neck soreness form raking leaves from her flower bed yesterday. Progressed to cervical retraction with against resistance without issue. She did well keeping her shoulder down with flexion and abduction. Some anterior shoulder pull reported with ER. Positive response to MT and modality evident bu increase tissue elasticity and decrease soreness.    Personal Factors and Comorbidities  Comorbidity 3+    Comorbidities  arthritis, TKA's, CVA    Stability/Clinical Decision Making  Stable/Uncomplicated    Rehab Potential  Good    PT Frequency  2x / week    PT Treatment/Interventions  ADLs/Self Care Home Management;Traction;Ultrasound;Moist Heat;Electrical Stimulation;Manual techniques;Therapeutic exercise;Therapeutic activities;Patient/family education;Dry needling    PT Next Visit Plan  progress with ROM/stretching and postural strength       Patient will benefit from skilled therapeutic intervention in order to improve the following deficits and impairments:  Pain, Improper body mechanics, Increased muscle spasms, Postural dysfunction, Decreased strength, Decreased range of motion  Visit Diagnosis: Cervicalgia  Cramp and spasm     Problem List Patient Active Problem List   Diagnosis Date Noted  . Cervicalgia 07/29/2019  . Trigger finger, left middle finger 04/28/2018  . Hypothyroidism 03/29/2017  . Bilateral carotid artery stenosis 09/25/2016  . Generalized anxiety disorder 06/20/2015  . SUI (stress urinary incontinence, female) 02/05/2014  . 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 Cerebral artery occlusion with cerebral infarction ([place]) 05/31/2010  . DM (diabetes mellitus), secondary, uncontrolled, with renal complications ([place]) 64/33/2951  . Dysuria 01/24/2010  . THYROID NODULE, RIGHT 01/21/2009  . Vitamin D deficiency  12/10/2007  . HYPERLIPIDEMIA 06/17/2007  . Essential hypertension 06/17/2007  . Osteopenia 06/17/2007  . Osteoarthrosis, unspecified whether generalized or localized, unspecified site 11/11/2006    Scot Jun, PTA 09/17/2019, 11:14 AM  [place], [place], [address] Phone: [phone]   Fax:  [phone]  Name: [name] MRN: [id number] Date of Birth: 10-13-1940

## 2019-09-20 ENCOUNTER — Encounter: Payer: Self-pay | Admitting: Internal Medicine

## 2019-09-21 ENCOUNTER — Encounter: Payer: Self-pay | Admitting: Physical Therapy

## 2019-09-21 ENCOUNTER — Other Ambulatory Visit: Payer: Self-pay

## 2019-09-21 ENCOUNTER — Ambulatory Visit: Payer: Medicare Other | Attending: Orthopaedic Surgery | Admitting: Physical Therapy

## 2019-09-21 DIAGNOSIS — M542 Cervicalgia: Secondary | ICD-10-CM

## 2019-09-21 DIAGNOSIS — R252 Cramp and spasm: Secondary | ICD-10-CM | POA: Diagnosis not present

## 2019-09-21 NOTE — Therapy (Signed)
[place], [place], [address] Phone: [phone]   Fax:  [phone]  Physical Therapy Treatment  Patient Details  Name: [name] MRN: [id number] Date of Birth: October 13, 1940 Referring Provider (PT): Durward Fortes   Encounter Date: 09/21/2019    Past Medical History:  Diagnosis Date  . Anxiety   . Arthritis HANDS, NECK , BACK  . Asymptomatic carotid artery stenosis LEFT --  MOD. PER DR WILLIS NOTE OCT 2012  . Benign heart murmur   . Depression    PMH of  . Diabetes mellitus ORAL MED  . DVT (deep venous thrombosis) ([place]) 2006   after arthroscopy  . Frequency of urination   . History of CVA (cerebrovascular accident) NEUROLOGIST  DR WILLIS----  06-01-2010-  LEFT BASAL GANGLIA HEMORRAGE   RESIDUAL RIGHT HAND NUMBNESS/TINGLING  . Hyperlipidemia   . Hypertension   . Hypothyroid   . MRSA (methicillin resistant staph aureus) culture positive    X3; last post TKR  . Nocturia   . Numbness and tingling in right hand RESIDUAL FROM CVA NOV 2011  . Obesity    Redux therapy  . Osteoporosis   . Thyroid disease    right thyroid nodule-- BX DONE 08-15-2011    Past Surgical History:  Procedure Laterality Date  . ABDOMINAL HYSTERECTOMY  1973   Endometriosis & fibroid  . CARPAL TUNNEL RELEASE  2000   RIGHT  . COLONOSCOPY  1999 & 2005   Dr Olevia Perches  . cystoscope     to evaluate recurrent UTIs  . CYSTOSCOPY W/ RETROGRADES  08/22/2011   Procedure: CYSTOSCOPY WITH RETROGRADE PYELOGRAM;  Surgeon: Molli Hazard, MD;  Location: [facility];  Service: Urology;  Laterality: Bilateral;  . CYSTOSCOPY WITH BIOPSY  08/22/2011   Procedure: CYSTOSCOPY WITH BIOPSY;  Surgeon: Molli Hazard, MD;  Location: [facility];  Service: Urology;  Laterality: N/A;  . g2 p2    . JOINT REPLACEMENT  03-28-2006   LEFT THUMB  . KNEE ARTHROSCOPY     BILATERAL PRIOR TO TOTAL KNEE  . LEFT  SHOULDER SURG  1990'S  . LUMBAR LAMINECTOMY  08-26-2009   L 4 - 5  . THYROID NODULE BX  08-15-2011   RIGHT  . TOTAL KNEE ARTHROPLASTY  05-26-2002      LEFT   AND RIGHT  03-30-2098  . WRIST SURGERY  2014   tendon placed in joint; Dr Burney Gauze    There were no vitals filed for this visit.  Subjective Assessment - 09/21/19 1303    Subjective  "I am ok, A little pain in my neck"    Currently in Pain?  Yes    Pain Score  1     Pain Location  Neck         [facility] PT Assessment - 09/21/19 0001      AROM   Overall AROM Comments  Cervical L rotation and sidebending decreased 25% all other motions [facility]                   [facility] Adult PT Treatment/Exercise - 09/21/19 0001      Neck Exercises: Machines for Strengthening   UBE (Upper Arm Bike)  L 3 3 min fwd/ 3 min backward    Cybex Row  25lb 2x10    Lat Pull  25lb 2x10      Neck Exercises: Standing   Neck Retraction  3 secs;20 reps  Wall Push Ups  10 reps   x2   Other Standing Exercises  Shouldr flex 3lb & abd 2lb 2x10       Neck Exercises: Seated   Neck Retraction  15 reps;3 secs      Shoulder Exercises: Standing   External Rotation  Strengthening;Both;Theraband;15 reps    Theraband Level (Shoulder External Rotation)  Level 3 (Green)    Other Standing Exercises  shrugs 5lb 2x10       Moist Heat Therapy   Number Minutes Moist Heat  12 Minutes    Moist Heat Location  Cervical      Manual Therapy   Manual Therapy  Soft tissue mobilization;Passive ROM;Manual Traction    Soft tissue mobilization  Cervical paraspinale into the upper traps. Good tissue elasticity    Passive ROM  cerv PROM with end range stretch to tolerance, Bilateral Shoulde PROM in all directions    Manual Traction  cervical traction 4x15 seconds               PT Short Term Goals - 08/19/19 1600      PT SHORT TERM GOAL #1   Title  independent iwth initial HEP    Time  2    Period  Weeks    Status  New        PT Long Term Goals -  09/21/19 1330      PT LONG TERM GOAL #1   Title  decrease HA's 50%    Status  Achieved      PT LONG TERM GOAL #2   Title  decrease pain 50%    Status  Achieved            Plan - 09/21/19 1343    Clinical Impression Statement  Pt has progressed meeting some LTG's. She still has some ROM limitations with L Rotation and side bending. Some difficulty getting cervical retraction movement pattern down. No reports of increase pain. Full passive cervical ROM noted with some pain with L rotation.    Comorbidities  arthritis, TKA's, CVA    Stability/Clinical Decision Making  Stable/Uncomplicated    Rehab Potential  Good    PT Frequency  2x / week    PT Duration  8 weeks    PT Treatment/Interventions  ADLs/Self Care Home Management;Traction;Ultrasound;Moist Heat;Electrical Stimulation;Manual techniques;Therapeutic exercise;Therapeutic activities;Patient/family education;Dry needling    PT Next Visit Plan  progress with ROM/stretching and postural strength       Patient will benefit from skilled therapeutic intervention in order to improve the following deficits and impairments:  Pain, Improper body mechanics, Increased muscle spasms, Postural dysfunction, Decreased strength, Decreased range of motion  Visit Diagnosis: Cramp and spasm  Cervicalgia     Problem List Patient Active Problem List   Diagnosis Date Noted  . Cervicalgia 07/29/2019  . Trigger finger, left middle finger 04/28/2018  . Hypothyroidism 03/29/2017  . Bilateral carotid artery stenosis 09/25/2016  . Generalized anxiety disorder 06/20/2015  . SUI (stress urinary incontinence, female) 02/05/2014  . 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 Cerebral artery occlusion with cerebral infarction ([place]) 05/31/2010  . DM (diabetes mellitus), secondary, uncontrolled, with renal complications ([place]) XX123456  . Dysuria 01/24/2010  . THYROID NODULE, RIGHT 01/21/2009  . Vitamin D deficiency 12/10/2007  . HYPERLIPIDEMIA  06/17/2007  . Essential hypertension 06/17/2007  . Osteopenia 06/17/2007  . Osteoarthrosis, unspecified whether generalized or localized, unspecified site 11/11/2006    Scot Jun, PTA 09/21/2019, 1:45 PM  [place], [place], [address] Phone: [phone]   Fax:  [phone]  Name: [name] MRN: [id number] Date of Birth: Aug 31, 1940

## 2019-09-24 ENCOUNTER — Ambulatory Visit: Payer: Medicare Other | Admitting: Physical Therapy

## 2019-09-24 ENCOUNTER — Other Ambulatory Visit: Payer: Self-pay

## 2019-09-24 ENCOUNTER — Encounter: Payer: Self-pay | Admitting: Physical Therapy

## 2019-09-24 DIAGNOSIS — R252 Cramp and spasm: Secondary | ICD-10-CM | POA: Diagnosis not present

## 2019-09-24 DIAGNOSIS — M542 Cervicalgia: Secondary | ICD-10-CM | POA: Diagnosis not present

## 2019-09-24 NOTE — Therapy (Signed)
[place], [place], [address] Phone: [phone]   Fax:  [phone]  Physical Therapy Treatment  Patient Details  Name: [name] MRN: [id number] Date of Birth: 06/10/1941 Referring Provider (PT): Durward Fortes   Encounter Date: 09/24/2019  PT End of Session - 09/24/19 1226    Visit Number  8    Date for PT Re-Evaluation  10/17/19    PT Start Time  1154    PT Stop Time  1232    PT Time Calculation (min)  38 min    Activity Tolerance  Patient tolerated treatment well    Behavior During Therapy  [facility] for tasks assessed/performed       Past Medical History:  Diagnosis Date  . Anxiety   . Arthritis HANDS, NECK , BACK  . Asymptomatic carotid artery stenosis LEFT --  MOD. PER DR WILLIS NOTE OCT 2012  . Benign heart murmur   . Depression    PMH of  . Diabetes mellitus ORAL MED  . DVT (deep venous thrombosis) ([place]) 2006   after arthroscopy  . Frequency of urination   . History of CVA (cerebrovascular accident) NEUROLOGIST  DR WILLIS----  06-01-2010-  LEFT BASAL GANGLIA HEMORRAGE   RESIDUAL RIGHT HAND NUMBNESS/TINGLING  . Hyperlipidemia   . Hypertension   . Hypothyroid   . MRSA (methicillin resistant staph aureus) culture positive    X3; last post TKR  . Nocturia   . Numbness and tingling in right hand RESIDUAL FROM CVA NOV 2011  . Obesity    Redux therapy  . Osteoporosis   . Thyroid disease    right thyroid nodule-- BX DONE 08-15-2011    Past Surgical History:  Procedure Laterality Date  . ABDOMINAL HYSTERECTOMY  1973   Endometriosis & fibroid  . CARPAL TUNNEL RELEASE  2000   RIGHT  . COLONOSCOPY  1999 & 2005   Dr Olevia Perches  . cystoscope     to evaluate recurrent UTIs  . CYSTOSCOPY W/ RETROGRADES  08/22/2011   Procedure: CYSTOSCOPY WITH RETROGRADE PYELOGRAM;  Surgeon: Molli Hazard, MD;  Location: [facility];  Service: Urology;  Laterality: Bilateral;  .  CYSTOSCOPY WITH BIOPSY  08/22/2011   Procedure: CYSTOSCOPY WITH BIOPSY;  Surgeon: Molli Hazard, MD;  Location: [facility];  Service: Urology;  Laterality: N/A;  . g2 p2    . JOINT REPLACEMENT  03-28-2006   LEFT THUMB  . KNEE ARTHROSCOPY     BILATERAL PRIOR TO TOTAL KNEE  . LEFT SHOULDER SURG  1990'S  . LUMBAR LAMINECTOMY  08-26-2009   L 4 - 5  . THYROID NODULE BX  08-15-2011   RIGHT  . TOTAL KNEE ARTHROPLASTY  05-26-2002      LEFT   AND RIGHT  03-30-2098  . WRIST SURGERY  2014   tendon placed in joint; Dr Burney Gauze    There were no vitals filed for this visit.  Subjective Assessment - 09/24/19 1155    Subjective  "i am feeling ok"    Currently in Pain?  No/denies                       [facility] Adult PT Treatment/Exercise - 09/24/19 0001      Neck Exercises: Machines for Strengthening   UBE (Upper Arm Bike)  L 3 3 min fwd/ 3 min backward    Cybex Row  25lb 2x10  Lat Pull  25lb 2x10      Neck Exercises: Standing   Other Standing Exercises  Horiz abd red 2x10       Neck Exercises: Seated   Other Seated Exercise  bent over row, ext, and rev flys 2lb 2x10       Shoulder Exercises: Standing   Other Standing Exercises  Standing ext 5lb 2x10      Moist Heat Therapy   Number Minutes Moist Heat  12 Minutes    Moist Heat Location  Cervical               PT Short Term Goals - 08/19/19 1600      PT SHORT TERM GOAL #1   Title  independent iwth initial HEP    Time  2    Period  Weeks    Status  New        PT Long Term Goals - 09/21/19 1330      PT LONG TERM GOAL #1   Title  decrease HA's 50%    Status  Achieved      PT LONG TERM GOAL #2   Title  decrease pain 50%    Status  Achieved            Plan - 09/24/19 1226    Clinical Impression Statement  Pt ~ 9 minutes late for today's session. Core weakness noted with bent over exercises, she did well retracting scapulas but had a hard time maintaining neutral spine.  Tolerated rows and lats well but some weakness noted with lat pull downs. Cues not to protrude neck forward with wall push ups. No reports of increase pain.    Personal Factors and Comorbidities  Comorbidity 3+    Comorbidities  arthritis, TKA's, CVA    Stability/Clinical Decision Making  Stable/Uncomplicated    Rehab Potential  Good    PT Frequency  2x / week    PT Treatment/Interventions  ADLs/Self Care Home Management;Traction;Ultrasound;Moist Heat;Electrical Stimulation;Manual techniques;Therapeutic exercise;Therapeutic activities;Patient/family education;Dry needling    PT Next Visit Plan  progress with ROM/stretching and postural strength       Patient will benefit from skilled therapeutic intervention in order to improve the following deficits and impairments:  Pain, Improper body mechanics, Increased muscle spasms, Postural dysfunction, Decreased strength, Decreased range of motion  Visit Diagnosis: Cramp and spasm  Cervicalgia     Problem List Patient Active Problem List   Diagnosis Date Noted  . Cervicalgia 07/29/2019  . Trigger finger, left middle finger 04/28/2018  . Hypothyroidism 03/29/2017  . Bilateral carotid artery stenosis 09/25/2016  . Generalized anxiety disorder 06/20/2015  . SUI (stress urinary incontinence, female) 02/05/2014  . 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 Cerebral artery occlusion with cerebral infarction ([place]) 05/31/2010  . DM (diabetes mellitus), secondary, uncontrolled, with renal complications ([place]) [id number]  . Dysuria 01/24/2010  . THYROID NODULE, RIGHT 01/21/2009  . Vitamin D deficiency 12/10/2007  . HYPERLIPIDEMIA 06/17/2007  . Essential hypertension 06/17/2007  . Osteopenia 06/17/2007  . Osteoarthrosis, unspecified whether generalized or localized, unspecified site 11/11/2006    Scot Jun, PTA 09/24/2019, 12:29 PM  [place],  [place], [address] Phone: [phone]   Fax:  [phone]  Name: [name] MRN: [id number] Date of Birth: 29-Nov-1940

## 2019-09-29 ENCOUNTER — Ambulatory Visit: Payer: Medicare Other | Admitting: Physical Therapy

## 2019-09-29 ENCOUNTER — Encounter: Payer: Self-pay | Admitting: Physical Therapy

## 2019-09-29 ENCOUNTER — Telehealth: Payer: Self-pay | Admitting: Orthopaedic Surgery

## 2019-09-29 ENCOUNTER — Other Ambulatory Visit: Payer: Self-pay

## 2019-09-29 DIAGNOSIS — M542 Cervicalgia: Secondary | ICD-10-CM

## 2019-09-29 DIAGNOSIS — R252 Cramp and spasm: Secondary | ICD-10-CM

## 2019-09-29 NOTE — Telephone Encounter (Signed)
Spoke with patient and rescheduled her to 10/01/2019.
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
## 2019-09-29 NOTE — Patient Instructions (Signed)
Access Code: [email or web address]  URL: [email or web address]  Date: 09/29/2019  Prepared by: Cheri Fowler   Exercises Standing Row with Resistance - 10 reps - 3 sets - 1x daily - 7x weekly Standing Elbow Extension with Anchored Resistance - 10 reps - 3 sets - 1x daily - 7x weekly Seated Cervical Retraction - 10 reps - 3 sets - 1x daily - 7x weekly
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
## 2019-10-01 ENCOUNTER — Ambulatory Visit (INDEPENDENT_AMBULATORY_CARE_PROVIDER_SITE_OTHER): Payer: Medicare Other

## 2019-10-01 ENCOUNTER — Encounter: Payer: Self-pay | Admitting: Orthopaedic Surgery

## 2019-10-01 ENCOUNTER — Other Ambulatory Visit: Payer: Self-pay

## 2019-10-01 ENCOUNTER — Encounter: Payer: Self-pay | Admitting: Physical Therapy

## 2019-10-01 ENCOUNTER — Ambulatory Visit (INDEPENDENT_AMBULATORY_CARE_PROVIDER_SITE_OTHER): Payer: Medicare Other | Admitting: Orthopaedic Surgery

## 2019-10-01 ENCOUNTER — Ambulatory Visit: Payer: Medicare Other | Admitting: Physical Therapy

## 2019-10-01 VITALS — Ht 66.0 in | Wt 169.0 lb

## 2019-10-01 DIAGNOSIS — M542 Cervicalgia: Secondary | ICD-10-CM

## 2019-10-01 DIAGNOSIS — R252 Cramp and spasm: Secondary | ICD-10-CM

## 2019-10-01 DIAGNOSIS — M25532 Pain in left wrist: Secondary | ICD-10-CM
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
## 2019-10-01 NOTE — Therapy (Signed)
[place], [place], [address] Phone: [phone]   Fax:  [phone] Progress Note Reporting Period 08/19/19 to 10/01/19 for the first 10 visits See note below for Objective Data and Assessment of Progress/Goals.      Physical Therapy Treatment  Patient Details  Name: [name] MRN: [id number] Date of Birth: [birth year] Referring Provider (PT): Durward Fortes   Encounter Date: 10/01/2019  PT End of Session - 10/01/19 1228    Visit Number  10    Date for PT Re-Evaluation  10/17/19    PT Start Time  1147    PT Stop Time  1240    PT Time Calculation (min)  53 min    Activity Tolerance  Patient tolerated treatment well    Behavior During Therapy  [facility] for tasks assessed/performed       Past Medical History:  Diagnosis Date  . Anxiety   . Arthritis HANDS, NECK , BACK  . Asymptomatic carotid artery stenosis LEFT --  MOD. PER DR WILLIS NOTE OCT 2012  . Benign heart murmur   . Depression    PMH of  . Diabetes mellitus ORAL MED  . DVT (deep venous thrombosis) ([place]) 2006   after arthroscopy  . Frequency of urination   . History of CVA (cerebrovascular accident) NEUROLOGIST  DR WILLIS----  06-01-2010-  LEFT BASAL GANGLIA HEMORRAGE   RESIDUAL RIGHT HAND NUMBNESS/TINGLING  . Hyperlipidemia   . Hypertension   . Hypothyroid   . MRSA (methicillin resistant staph aureus) culture positive    X3; last post TKR  . Nocturia   . Numbness and tingling in right hand RESIDUAL FROM CVA NOV 2011  . Obesity    Redux therapy  . Osteoporosis   . Thyroid disease    right thyroid nodule-- BX DONE 08-15-2011    Past Surgical History:  Procedure Laterality Date  . ABDOMINAL HYSTERECTOMY  1973   Endometriosis & fibroid  . CARPAL TUNNEL RELEASE  2000   RIGHT  . COLONOSCOPY  1999 & 2005   Dr Olevia Perches  . cystoscope     to evaluate recurrent UTIs  . CYSTOSCOPY W/ RETROGRADES  08/22/2011   Procedure: CYSTOSCOPY  WITH RETROGRADE PYELOGRAM;  Surgeon: Molli Hazard, MD;  Location: [facility];  Service: Urology;  Laterality: Bilateral;  . CYSTOSCOPY WITH BIOPSY  08/22/2011   Procedure: CYSTOSCOPY WITH BIOPSY;  Surgeon: Molli Hazard, MD;  Location: [facility];  Service: Urology;  Laterality: N/A;  . g2 p2    . JOINT REPLACEMENT  03-28-2006   LEFT THUMB  . KNEE ARTHROSCOPY     BILATERAL PRIOR TO TOTAL KNEE  . LEFT SHOULDER SURG  1990'S  . LUMBAR LAMINECTOMY  08-26-2009   L 4 - 5  . THYROID NODULE BX  08-15-2011   RIGHT  . TOTAL KNEE ARTHROPLASTY  05-26-2002      LEFT   AND RIGHT  03-30-2098  . WRIST SURGERY  2014   tendon placed in joint; Dr Burney Gauze    There were no vitals filed for this visit.  Subjective Assessment - 10/01/19 1153    Subjective  "I felt bad yesterday, just Blah" Pt report an increase in    neck, shoulder, wrist, and Low back pain. She also reports an increase in headaches some waking her up night    Currently in Pain?  Yes    Pain Score  4  Pain Location  --   Neck down through middle of back                      [facility] Adult PT Treatment/Exercise - 10/01/19 0001      Neck Exercises: Machines for Strengthening   UBE (Upper Arm Bike)  L 2 3 min fwd/ 3 min backward      Neck Exercises: Standing   Other Standing Exercises  Rows yellow 2x10    Other Standing Exercises  Shoulderext yellow  2x15       Modalities   Modalities  Moist Heat;Electrical Stimulation      Moist Heat Therapy   Number Minutes Moist Heat  15 Minutes    Moist Heat Location  Cervical      Electrical Stimulation   Electrical Stimulation Location  Cervical spine    Electrical Stimulation Action  IFC    Electrical Stimulation Parameters  supine    Electrical Stimulation Goals  Pain      Manual Therapy   Manual Therapy  Soft tissue mobilization;Passive ROM;Manual Traction    Soft tissue mobilization  Cervical paraspinale into the  upper traps.    Passive ROM  cerv PROM with end range stretch to tolerance,    Manual Traction  cervical traction 4x15 seconds               PT Short Term Goals - 08/19/19 1600      PT SHORT TERM GOAL #1   Title  independent iwth initial HEP    Time  2    Period  Weeks    Status  New        PT Long Term Goals - 09/21/19 1330      PT LONG TERM GOAL #1   Title  decrease HA's 50%    Status  Achieved      PT LONG TERM GOAL #2   Title  decrease pain 50%    Status  Achieved            Plan - 10/01/19 1228    Clinical Impression Statement  Pt reports that she has a MD appointment today. She expressed that's he has been having an increase in headaches, low back pain, L shoulder / neck pain, and pain in the wrist. She was able to complete a regressed session, but she did report increase difficulty gripping the Tband. Some L side neck soreness reported with STM.    Personal Factors and Comorbidities  Comorbidity 3+    Comorbidities  arthritis, TKA's, CVA    Stability/Clinical Decision Making  Stable/Uncomplicated    Rehab Potential  Good    PT Frequency  2x / week    PT Duration  8 weeks    PT Treatment/Interventions  ADLs/Self Care Home Management;Traction;Ultrasound;Moist Heat;Electrical Stimulation;Manual techniques;Therapeutic exercise;Therapeutic activities;Patient/family education;Dry needling    PT Next Visit Plan  back down some exercises and use modalities as heeded for pain and spasms. Get inctructions from MD       Patient will benefit from skilled therapeutic intervention in order to improve the following deficits and impairments:  Pain, Improper body mechanics, Increased muscle spasms, Postural dysfunction, Decreased strength, Decreased range of motion  Visit Diagnosis: Cervicalgia  Cramp and spasm     Problem List Patient Active Problem List   Diagnosis Date Noted  . Cervicalgia 07/29/2019  . Trigger finger, left middle finger 04/28/2018  .  Hypothyroidism 03/29/2017  . Bilateral carotid artery stenosis 09/25/2016  .  Generalized anxiety disorder 06/20/2015  . SUI (stress urinary incontinence, female) 02/05/2014  . 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 Cerebral artery occlusion with cerebral infarction ([place]) 05/31/2010  . DM (diabetes mellitus), secondary, uncontrolled, with renal complications ([place]) [id number]  . Dysuria 01/24/2010  . THYROID NODULE, RIGHT 01/21/2009  . Vitamin D deficiency 12/10/2007  . HYPERLIPIDEMIA 06/17/2007  . Essential hypertension 06/17/2007  . Osteopenia 06/17/2007  . Osteoarthrosis, unspecified whether generalized or localized, unspecified site 11/11/2006    Scot Jun, PTA 10/01/2019, 12:31 PM  [place], [place], [address] Phone: [phone]   Fax:  [phone]  Name: [name] MRN: [id number] Date of Birth: [birth year]

## 2019-10-06 ENCOUNTER — Encounter: Payer: Self-pay | Admitting: Physical Therapy

## 2019-10-06 ENCOUNTER — Ambulatory Visit: Payer: Medicare Other | Admitting: Physical Therapy

## 2019-10-06 ENCOUNTER — Ambulatory Visit: Payer: Medicare Other | Admitting: Orthopaedic Surgery

## 2019-10-06 ENCOUNTER — Other Ambulatory Visit: Payer: Self-pay

## 2019-10-06 DIAGNOSIS — M542 Cervicalgia: Secondary | ICD-10-CM

## 2019-10-06 DIAGNOSIS — R252 Cramp and spasm: Secondary | ICD-10-CM | POA: Diagnosis not present
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
## 2019-10-08 ENCOUNTER — Ambulatory Visit: Payer: Medicare Other | Admitting: Physical Therapy

## 2019-10-08 ENCOUNTER — Other Ambulatory Visit: Payer: Self-pay

## 2019-10-08 DIAGNOSIS — M542 Cervicalgia: Secondary | ICD-10-CM

## 2019-10-08 DIAGNOSIS — R252 Cramp and spasm: Secondary | ICD-10-CM | POA: Diagnosis not present
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
## 2019-10-12 ENCOUNTER — Encounter: Payer: Self-pay | Admitting: Physical Therapy

## 2019-10-12 ENCOUNTER — Other Ambulatory Visit: Payer: Self-pay

## 2019-10-12 ENCOUNTER — Ambulatory Visit: Payer: Medicare Other | Admitting: Physical Therapy

## 2019-10-12 DIAGNOSIS — R252 Cramp and spasm: Secondary | ICD-10-CM | POA: Diagnosis not present

## 2019-10-12 DIAGNOSIS — M542 Cervicalgia: Secondary | ICD-10-CM | POA: Diagnosis not present
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
## 2019-10-15 ENCOUNTER — Other Ambulatory Visit: Payer: Self-pay

## 2019-10-15 ENCOUNTER — Ambulatory Visit: Payer: Medicare Other | Admitting: Physical Therapy

## 2019-10-15 ENCOUNTER — Encounter: Payer: Self-pay | Admitting: Physical Therapy

## 2019-10-15 DIAGNOSIS — M542 Cervicalgia: Secondary | ICD-10-CM

## 2019-10-15 DIAGNOSIS — R252 Cramp and spasm: Secondary | ICD-10-CM
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
## 2019-10-20 ENCOUNTER — Other Ambulatory Visit: Payer: Self-pay

## 2019-10-20 ENCOUNTER — Ambulatory Visit: Payer: Medicare Other | Admitting: Physical Therapy

## 2019-10-20 DIAGNOSIS — M542 Cervicalgia: Secondary | ICD-10-CM | POA: Diagnosis not present

## 2019-10-20 DIAGNOSIS — R252 Cramp and spasm: Secondary | ICD-10-CM
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
## 2019-10-22 ENCOUNTER — Ambulatory Visit: Payer: Medicare Other | Attending: Orthopaedic Surgery | Admitting: Physical Therapy

## 2019-10-22 ENCOUNTER — Encounter: Payer: Self-pay | Admitting: Physical Therapy

## 2019-10-22 ENCOUNTER — Other Ambulatory Visit: Payer: Self-pay

## 2019-10-22 DIAGNOSIS — M5442 Lumbago with sciatica, left side: Secondary | ICD-10-CM | POA: Diagnosis not present

## 2019-10-22 DIAGNOSIS — M542 Cervicalgia: Secondary | ICD-10-CM | POA: Diagnosis not present

## 2019-10-22 DIAGNOSIS — R252 Cramp and spasm: Secondary | ICD-10-CM | POA: Diagnosis not present

## 2019-10-22 NOTE — Therapy (Signed)
[place], [place], [address] Phone: [phone]   Fax:  [phone]  Physical Therapy Treatment  Patient Details  Name: [name] MRN: [id number] Date of Birth: 12/15/1940 Referring Provider (PT): Durward Fortes   Encounter Date: 10/22/2019  PT End of Session - 10/22/19 1014    Visit Number  16    Date for PT Re-Evaluation  10/17/19    PT Start Time  0933    PT Stop Time  1029    PT Time Calculation (min)  56 min    Activity Tolerance  Patient tolerated treatment well    Behavior During Therapy  [facility] for tasks assessed/performed       Past Medical History:  Diagnosis Date  . Anxiety   . Arthritis HANDS, NECK , BACK  . Asymptomatic carotid artery stenosis LEFT --  MOD. PER DR WILLIS NOTE OCT 2012  . Benign heart murmur   . Depression    PMH of  . Diabetes mellitus ORAL MED  . DVT (deep venous thrombosis) ([place]) 2006   after arthroscopy  . Frequency of urination   . History of CVA (cerebrovascular accident) NEUROLOGIST  DR WILLIS----  06-01-2010-  LEFT BASAL GANGLIA HEMORRAGE   RESIDUAL RIGHT HAND NUMBNESS/TINGLING  . Hyperlipidemia   . Hypertension   . Hypothyroid   . MRSA (methicillin resistant staph aureus) culture positive    X3; last post TKR  . Nocturia   . Numbness and tingling in right hand RESIDUAL FROM CVA NOV 2011  . Obesity    Redux therapy  . Osteoporosis   . Thyroid disease    right thyroid nodule-- BX DONE 08-15-2011    Past Surgical History:  Procedure Laterality Date  . ABDOMINAL HYSTERECTOMY  1973   Endometriosis & fibroid  . CARPAL TUNNEL RELEASE  2000   RIGHT  . COLONOSCOPY  1999 & 2005   Dr Olevia Perches  . cystoscope     to evaluate recurrent UTIs  . CYSTOSCOPY W/ RETROGRADES  08/22/2011   Procedure: CYSTOSCOPY WITH RETROGRADE PYELOGRAM;  Surgeon: Molli Hazard, MD;  Location: [facility];  Service: Urology;  Laterality: Bilateral;  .  CYSTOSCOPY WITH BIOPSY  08/22/2011   Procedure: CYSTOSCOPY WITH BIOPSY;  Surgeon: Molli Hazard, MD;  Location: [facility];  Service: Urology;  Laterality: N/A;  . g2 p2    . JOINT REPLACEMENT  03-28-2006   LEFT THUMB  . KNEE ARTHROSCOPY     BILATERAL PRIOR TO TOTAL KNEE  . LEFT SHOULDER SURG  1990'S  . LUMBAR LAMINECTOMY  08-26-2009   L 4 - 5  . THYROID NODULE BX  08-15-2011   RIGHT  . TOTAL KNEE ARTHROPLASTY  05-26-2002      LEFT   AND RIGHT  03-30-2098  . WRIST SURGERY  2014   tendon placed in joint; Dr Burney Gauze    There were no vitals filed for this visit.  Subjective Assessment - 10/22/19 0936    Subjective  "Well I am better" "Been having some pain in my low back"    Currently in Pain?  No/denies                       [facility] Adult PT Treatment/Exercise - 10/22/19 0001      Neck Exercises: Machines for Strengthening   UBE (Upper Arm Bike)  L 3 3 min fwd/ 3 min backward  Cybex Row  20# 2 sets 10    Lat Pull  20# 2x10    Other Machines for Strengthening  black tband trunk ext 2 sets 10      Neck Exercises: Supine   Neck Retraction  15 reps;3 secs      Lumbar Exercises: Stretches   Passive Hamstring Stretch  Right;Left;3 reps;10 seconds    Single Knee to Chest Stretch  Left;Right;3 reps;10 seconds      Lumbar Exercises: Supine   Bridge  Compliant;10 reps;3 seconds    Other Supine Lumbar Exercises  feet on ball bridge, obl and KTC      Moist Heat Therapy   Number Minutes Moist Heat  15 Minutes    Moist Heat Location  Cervical;Lumbar Spine      Electrical Stimulation   Electrical Stimulation Location  cerv/ Left lumb    Electrical Stimulation Action  premod    Electrical Stimulation Parameters  supine    Electrical Stimulation Goals  Pain               PT Short Term Goals - 10/20/19 1101      PT SHORT TERM GOAL #1   Title  independent iwth initial HEP    Status  Achieved        PT Long Term Goals -  10/20/19 1101      PT LONG TERM GOAL #1   Title  decrease HA's 50%    Status  Achieved      PT LONG TERM GOAL #2   Title  decrease pain 50%    Status  Achieved      PT LONG TERM GOAL #3   Title  increase cervical ROM 25%    Status  Partially Met      PT LONG TERM GOAL #4   Title  understand posture and body mechanics    Status  Partially Met      PT LONG TERM GOAL #5   Title  work in the garden without increase of pain    Status  Partially Met            Plan - 10/22/19 1015    Clinical Impression Statement  Positive response from last treatment so kept the interventions the same. Added some passive stretching to the LE without severe tightness. Continues modalities to low back and cervical area.    Comorbidities  arthritis, TKA's, CVA    Rehab Potential  Good    PT Frequency  2x / week    PT Duration  8 weeks    PT Treatment/Interventions  ADLs/Self Care Home Management;Traction;Ultrasound;Moist Heat;Electrical Stimulation;Manual techniques;Therapeutic exercise;Therapeutic activities;Patient/family education;Dry needling    PT Next Visit Plan  assess and progress       Patient will benefit from skilled therapeutic intervention in order to improve the following deficits and impairments:  Pain, Improper body mechanics, Increased muscle spasms, Postural dysfunction, Decreased strength, Decreased range of motion  Visit Diagnosis: Cervicalgia  Cramp and spasm     Problem List Patient Active Problem List   Diagnosis Date Noted  . Pain in left wrist 10/01/2019  . Cervicalgia 07/29/2019  . Trigger finger, left middle finger 04/28/2018  . Hypothyroidism 03/29/2017  . Bilateral carotid artery stenosis 09/25/2016  . Generalized anxiety disorder 06/20/2015  . SUI (stress urinary incontinence, female) 02/05/2014  . 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 Cerebral artery occlusion with cerebral infarction ([place]) 05/31/2010  . DM (diabetes mellitus), secondary,  uncontrolled, with renal complications (  [place]) 01/24/2010  . Dysuria 01/24/2010  . THYROID NODULE, RIGHT 01/21/2009  . Vitamin D deficiency 12/10/2007  . HYPERLIPIDEMIA 06/17/2007  . Essential hypertension 06/17/2007  . Osteopenia 06/17/2007  . Osteoarthrosis, unspecified whether generalized or localized, unspecified site 11/11/2006    Scot Jun, PTA 10/22/2019, 10:16 AM  [place], [place], [address] Phone: [phone]   Fax:  [phone]  Name: [name] MRN: [id number] Date of Birth: 1941/02/02

## 2019-10-27 ENCOUNTER — Ambulatory Visit: Payer: Medicare Other | Admitting: Physical Therapy

## 2019-10-27 ENCOUNTER — Other Ambulatory Visit: Payer: Self-pay

## 2019-10-27 ENCOUNTER — Encounter: Payer: Self-pay | Admitting: Physical Therapy

## 2019-10-27 DIAGNOSIS — M542 Cervicalgia: Secondary | ICD-10-CM

## 2019-10-27 DIAGNOSIS — M5442 Lumbago with sciatica, left side: Secondary | ICD-10-CM | POA: Diagnosis not present

## 2019-10-27 DIAGNOSIS — R252 Cramp and spasm: Secondary | ICD-10-CM | POA: Diagnosis not present

## 2019-10-27 NOTE — Therapy (Signed)
[place], [place], [address] Phone: [phone]   Fax:  [phone]  Physical Therapy Treatment  Patient Details  Name: [name] MRN: [id number] Date of Birth: [birth year] Referring Provider (PT): Durward Fortes   Encounter Date: 10/27/2019  PT End of Session - 10/27/19 1141    Visit Number  12    PT Start Time  1100    PT Stop Time  1154    PT Time Calculation (min)  54 min    Activity Tolerance  Patient tolerated treatment well    Behavior During Therapy  [facility] for tasks assessed/performed       Past Medical History:  Diagnosis Date  . Anxiety   . Arthritis HANDS, NECK , BACK  . Asymptomatic carotid artery stenosis LEFT --  MOD. PER DR WILLIS NOTE OCT 2012  . Benign heart murmur   . Depression    PMH of  . Diabetes mellitus ORAL MED  . DVT (deep venous thrombosis) ([place]) 2006   after arthroscopy  . Frequency of urination   . History of CVA (cerebrovascular accident) NEUROLOGIST  DR WILLIS----  06-01-2010-  LEFT BASAL GANGLIA HEMORRAGE   RESIDUAL RIGHT HAND NUMBNESS/TINGLING  . Hyperlipidemia   . Hypertension   . Hypothyroid   . MRSA (methicillin resistant staph aureus) culture positive    X3; last post TKR  . Nocturia   . Numbness and tingling in right hand RESIDUAL FROM CVA NOV 2011  . Obesity    Redux therapy  . Osteoporosis   . Thyroid disease    right thyroid nodule-- BX DONE 08-15-2011    Past Surgical History:  Procedure Laterality Date  . ABDOMINAL HYSTERECTOMY  1973   Endometriosis & fibroid  . CARPAL TUNNEL RELEASE  2000   RIGHT  . COLONOSCOPY  1999 & 2005   Dr Olevia Perches  . cystoscope     to evaluate recurrent UTIs  . CYSTOSCOPY W/ RETROGRADES  08/22/2011   Procedure: CYSTOSCOPY WITH RETROGRADE PYELOGRAM;  Surgeon: Molli Hazard, MD;  [facility];  Service: Urology;  Laterality: Bilateral;  . CYSTOSCOPY WITH BIOPSY  08/22/2011   Procedure: CYSTOSCOPY WITH BIOPSY;  Surgeon: Molli Hazard, MD;  Location: [facility];  Service: Urology;  Laterality: N/A;  . g2 p2    . JOINT REPLACEMENT  03-28-2006   LEFT THUMB  . KNEE ARTHROSCOPY     BILATERAL PRIOR TO TOTAL KNEE  . LEFT SHOULDER SURG  1990'S  . LUMBAR LAMINECTOMY  08-26-2009   L 4 - 5  . THYROID NODULE BX  08-15-2011   RIGHT  . TOTAL KNEE ARTHROPLASTY  05-26-2002      LEFT   AND RIGHT  03-30-2098  . WRIST SURGERY  2014   tendon placed in joint; Dr Burney Gauze    There were no vitals filed for this visit.  Subjective Assessment - 10/27/19 1105    Subjective  Shoulder and neck are much better, she reports sleeping better, not as many headaches, but the low back on L side has been bothering her more    Pertinent History  CVA, Total joint replacements    Limitations  Reading;House hold activities    Currently in Pain?  Yes    Pain Score  4     Pain Location  Back    Pain Orientation  Left  [place] Adult PT Treatment/Exercise - 10/27/19 0001      Neck Exercises: Machines for Strengthening   UBE (Upper Arm Bike)  L 4 3 min fwd/ 3 min backward    Cybex Row  20# 2 sets 10    Lat Pull  20# 2x10    Other Machines for Strengthening  black tband trunk ext 2 sets 15      Lumbar Exercises: Stretches   Single Knee to Chest Stretch  Left;Right;3 reps;10 seconds    Double Knee to Chest Stretch  4 reps;10 seconds    Lower Trunk Rotation  3 reps;10 seconds      Lumbar Exercises: Standing   Shoulder Extension  20 reps;Both    Shoulder Extension Limitations  10lb       Lumbar Exercises: Supine   Bridge  Compliant;3 seconds;15 reps    Other Supine Lumbar Exercises  feet on ball bridge, obl and KTC               PT Short Term Goals - 10/20/19 1101      PT SHORT TERM GOAL #1   Title  independent iwth initial HEP    Status  Achieved        PT Long Term Goals - 10/20/19 1101      PT LONG TERM  GOAL #1   Title  decrease HA's 50%    Status  Achieved      PT LONG TERM GOAL #2   Title  decrease pain 50%    Status  Achieved      PT LONG TERM GOAL #3   Title  increase cervical ROM 25%    Status  Partially Met      PT LONG TERM GOAL #4   Title  understand posture and body mechanics    Status  Partially Met      PT LONG TERM GOAL #5   Title  work in the garden without increase of pain    Status  Partially Met            Plan - 10/27/19 1142    Clinical Impression Statement  Pt reports that her neck has really improved, but her back has been more bothersome. Postural cues needed with shoulder extensions. More elevation achieved today with bridges. No reports of increase pain during the session. Positive response to [facility] stretch.    Personal Factors and Comorbidities  Comorbidity 3+    Comorbidities  arthritis, TKA's, CVA    Stability/Clinical Decision Making  Stable/Uncomplicated    Rehab Potential  Good    PT Frequency  2x / week    PT Treatment/Interventions  ADLs/Self Care Home Management;Traction;Ultrasound;Moist Heat;Electrical Stimulation;Manual techniques;Therapeutic exercise;Therapeutic activities;Patient/family education;Dry needling    PT Next Visit Plan  assess and progress       Patient will benefit from skilled therapeutic intervention in order to improve the following deficits and impairments:  Pain, Improper body mechanics, Increased muscle spasms, Postural dysfunction, Decreased strength, Decreased range of motion  Visit Diagnosis: Cervicalgia  Cramp and spasm     Problem List Patient Active Problem List   Diagnosis Date Noted  . Pain in left wrist 10/01/2019  . Cervicalgia 07/29/2019  . Trigger finger, left middle finger 04/28/2018  . Hypothyroidism 03/29/2017  . Bilateral carotid artery stenosis 09/25/2016  . Generalized anxiety disorder 06/20/2015  . SUI (stress urinary incontinence, female) 02/05/2014  . Recto-bladder neck fistula  07/29/2013  . Cerebral artery occlusion with cerebral infarction ([place]) 05/31/2010  .  DM (diabetes mellitus), secondary, uncontrolled, with renal complications ([place]) 21/22/4825  . Dysuria 01/24/2010  . THYROID NODULE, RIGHT 01/21/2009  . Vitamin D deficiency 12/10/2007  . HYPERLIPIDEMIA 06/17/2007  . Essential hypertension 06/17/2007  . Osteopenia 06/17/2007  . Osteoarthrosis, unspecified whether generalized or localized, unspecified site 11/11/2006    Scot Jun, PTA 10/27/2019, 11:44 AM  [place], [place], [address] Phone: [phone]   Fax:  [phone]  Name: [name] MRN: [id number] Date of Birth: October 04, 1940

## 2019-10-29 ENCOUNTER — Ambulatory Visit: Payer: Self-pay

## 2019-10-29 ENCOUNTER — Ambulatory Visit (INDEPENDENT_AMBULATORY_CARE_PROVIDER_SITE_OTHER): Payer: Medicare Other | Admitting: Orthopaedic Surgery

## 2019-10-29 ENCOUNTER — Encounter: Payer: Self-pay | Admitting: Orthopaedic Surgery

## 2019-10-29 ENCOUNTER — Other Ambulatory Visit: Payer: Self-pay

## 2019-10-29 VITALS — Ht 66.0 in | Wt 169.0 lb

## 2019-10-29 DIAGNOSIS — M545 Low back pain, unspecified: Secondary | ICD-10-CM | POA: Insufficient documentation

## 2019-10-29 DIAGNOSIS — G8929 Other chronic pain: Secondary | ICD-10-CM | POA: Diagnosis not present

## 2019-10-29 DIAGNOSIS — M5442 Lumbago with sciatica, left side: Secondary | ICD-10-CM
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
## 2019-11-03 ENCOUNTER — Ambulatory Visit: Payer: Medicare Other | Admitting: Physical Therapy

## 2019-11-05 ENCOUNTER — Other Ambulatory Visit: Payer: Self-pay | Admitting: Internal Medicine

## 2019-11-10 ENCOUNTER — Encounter: Payer: Self-pay | Admitting: Physical Therapy

## 2019-11-10 ENCOUNTER — Other Ambulatory Visit: Payer: Self-pay

## 2019-11-10 ENCOUNTER — Ambulatory Visit: Payer: Medicare Other | Admitting: Physical Therapy

## 2019-11-10 DIAGNOSIS — M5442 Lumbago with sciatica, left side: Secondary | ICD-10-CM

## 2019-11-10 DIAGNOSIS — M542 Cervicalgia: Secondary | ICD-10-CM | POA: Diagnosis not present

## 2019-11-10 DIAGNOSIS — R252 Cramp and spasm: Secondary | ICD-10-CM | POA: Diagnosis not present

## 2019-11-10 DIAGNOSIS — N3020 Other chronic cystitis without hematuria: Secondary | ICD-10-CM | POA: Diagnosis not present
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
## 2019-11-12 ENCOUNTER — Other Ambulatory Visit: Payer: Self-pay

## 2019-11-12 ENCOUNTER — Encounter: Payer: Self-pay | Admitting: Physical Therapy

## 2019-11-12 ENCOUNTER — Ambulatory Visit: Payer: Medicare Other | Admitting: Physical Therapy

## 2019-11-12 DIAGNOSIS — R252 Cramp and spasm: Secondary | ICD-10-CM | POA: Diagnosis not present

## 2019-11-12 DIAGNOSIS — M542 Cervicalgia: Secondary | ICD-10-CM | POA: Diagnosis not present

## 2019-11-12 DIAGNOSIS — M5442 Lumbago with sciatica, left side: Secondary | ICD-10-CM | POA: Diagnosis not present

## 2019-11-12 NOTE — Therapy (Signed)
[place], [place], [address] Phone: [phone]   Fax:  [phone]  Physical Therapy Treatment  Patient Details  Name: [name] MRN: [id number] Date of Birth: 1941/04/18 Referring Provider (PT): Durward Fortes   Encounter Date: 11/12/2019  PT End of Session - 11/12/19 1056    Visit Number  19    Date for PT Re-Evaluation  12/10/19    PT Start Time  1019    PT Stop Time  1110    PT Time Calculation (min)  51 min    Activity Tolerance  Patient tolerated treatment well    Behavior During Therapy  [facility] for tasks assessed/performed       Past Medical History:  Diagnosis Date  . Anxiety   . Arthritis HANDS, NECK , BACK  . Asymptomatic carotid artery stenosis LEFT --  MOD. PER DR WILLIS NOTE OCT 2012  . Benign heart murmur   . Depression    PMH of  . Diabetes mellitus ORAL MED  . DVT (deep venous thrombosis) ([place]) 2006   after arthroscopy  . Frequency of urination   . History of CVA (cerebrovascular accident) NEUROLOGIST  DR WILLIS----  06-01-2010-  LEFT BASAL GANGLIA HEMORRAGE   RESIDUAL RIGHT HAND NUMBNESS/TINGLING  . Hyperlipidemia   . Hypertension   . Hypothyroid   . MRSA (methicillin resistant staph aureus) culture positive    X3; last post TKR  . Nocturia   . Numbness and tingling in right hand RESIDUAL FROM CVA NOV 2011  . Obesity    Redux therapy  . Osteoporosis   . Thyroid disease    right thyroid nodule-- BX DONE 08-15-2011    Past Surgical History:  Procedure Laterality Date  . ABDOMINAL HYSTERECTOMY  1973   Endometriosis & fibroid  . CARPAL TUNNEL RELEASE  2000   RIGHT  . COLONOSCOPY  1999 & 2005   Dr Olevia Perches  . cystoscope     to evaluate recurrent UTIs  . CYSTOSCOPY W/ RETROGRADES  08/22/2011   Procedure: CYSTOSCOPY WITH RETROGRADE PYELOGRAM;  Surgeon: Molli Hazard, MD;  Location: [facility];  Service: Urology;  Laterality: Bilateral;  .  CYSTOSCOPY WITH BIOPSY  08/22/2011   Procedure: CYSTOSCOPY WITH BIOPSY;  Surgeon: Molli Hazard, MD;  Location: [facility];  Service: Urology;  Laterality: N/A;  . g2 p2    . JOINT REPLACEMENT  03-28-2006   LEFT THUMB  . KNEE ARTHROSCOPY     BILATERAL PRIOR TO TOTAL KNEE  . LEFT SHOULDER SURG  1990'S  . LUMBAR LAMINECTOMY  08-26-2009   L 4 - 5  . THYROID NODULE BX  08-15-2011   RIGHT  . TOTAL KNEE ARTHROPLASTY  05-26-2002      LEFT   AND RIGHT  03-30-2098  . WRIST SURGERY  2014   tendon placed in joint; Dr Burney Gauze    There were no vitals filed for this visit.  Subjective Assessment - 11/12/19 1018    Subjective  Doing pretty good but her low back is hurting    Currently in Pain?  Yes    Pain Score  4     Pain Location  Back    Pain Orientation  Lower                       [facility] Adult PT Treatment/Exercise - 11/12/19 0001      Lumbar Exercises: Stretches  Single Knee to Chest Stretch  Left;Right;3 reps;10 seconds    Double Knee to Chest Stretch  10 seconds;3 reps    Lower Trunk Rotation  3 reps;10 seconds    Other Lumbar Stretch Exercise  piriformis stretch 3 x10 each      Lumbar Exercises: Aerobic   Nustep  L5 x 7 min       Lumbar Exercises: Machines for Strengthening   Cybex Knee Extension  5lb 2x10     Cybex Knee Flexion  20lb 2x10       Lumbar Exercises: Standing   Shoulder Extension  Both;15 reps;Power Tower;Strengthening   x2   Shoulder Extension Limitations  5      Lumbar Exercises: Seated   Sit to Stand  5 reps   x2     Lumbar Exercises: Supine   Other Supine Lumbar Exercises  feet on ball bridge, obl and KTC      Moist Heat Therapy   Number Minutes Moist Heat  15 Minutes    Moist Heat Location  Lumbar Spine      Electrical Stimulation   Electrical Stimulation Location  Lumbar spine    Electrical Stimulation Action  IFC    Electrical Stimulation Parameters  supine    Electrical Stimulation Goals  Pain                PT Short Term Goals - 10/20/19 1101      PT SHORT TERM GOAL #1   Title  independent iwth initial HEP    Status  Achieved        PT Long Term Goals - 11/10/19 1039      Additional Long Term Goals   Additional Long Term Goals  Yes      PT LONG TERM GOAL #6   Title  decrease LBP 50%    Time  8    Status  New      PT LONG TERM GOAL #7   Title  report that she is able to go up and down stairs step over step without difficulty and not fatigue with normal walking    Time  8    Period  Weeks    Status  New            Plan - 11/12/19 1057    Clinical Impression Statement  Pt was able to progress to some LE and functional postural strengthening interventions without issue. Tactile cues needed to maintain correct posture with standing shoulder extensions. No reports of increase ain during the activities. She has a positive response to trunk rotations going to her R side.    Comorbidities  arthritis, TKA's, CVA    Rehab Potential  Good    PT Frequency  2x / week    PT Treatment/Interventions  ADLs/Self Care Home Management;Traction;Ultrasound;Moist Heat;Electrical Stimulation;Manual techniques;Therapeutic exercise;Therapeutic activities;Patient/family education;Dry needling    PT Next Visit Plan  Core strengthening.       Patient will benefit from skilled therapeutic intervention in order to improve the following deficits and impairments:  Pain, Improper body mechanics, Increased muscle spasms, Postural dysfunction, Decreased strength, Decreased range of motion  Visit Diagnosis: Acute left-sided low back pain with left-sided sciatica  Cramp and spasm     Problem List Patient Active Problem List   Diagnosis Date Noted  . Low back pain 10/29/2019  . Pain in left wrist 10/01/2019  . Cervicalgia 07/29/2019  . Trigger finger, left middle finger 04/28/2018  . Hypothyroidism 03/29/2017  .  Bilateral carotid artery stenosis 09/25/2016  . Generalized  anxiety disorder 06/20/2015  . SUI (stress urinary incontinence, female) 02/05/2014  . 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 Cerebral artery occlusion with cerebral infarction ([place]) 05/31/2010  . DM (diabetes mellitus), secondary, uncontrolled, with renal complications ([place]) [id number]  . Dysuria 01/24/2010  . THYROID NODULE, RIGHT 01/21/2009  . Vitamin D deficiency 12/10/2007  . HYPERLIPIDEMIA 06/17/2007  . Essential hypertension 06/17/2007  . Osteopenia 06/17/2007  . Osteoarthrosis, unspecified whether generalized or localized, unspecified site 11/11/2006    Scot Jun, PTA 11/12/2019, 10:59 AM  [place], [place], [address] Phone: [phone]   Fax:  [phone]  Name: [name] MRN: [id number] Date of Birth: 1940/11/18

## 2019-11-17 ENCOUNTER — Ambulatory Visit: Payer: Medicare Other | Admitting: Physical Therapy

## 2019-11-17 ENCOUNTER — Encounter: Payer: Self-pay | Admitting: Physical Therapy

## 2019-11-17 ENCOUNTER — Other Ambulatory Visit: Payer: Self-pay

## 2019-11-17 DIAGNOSIS — R252 Cramp and spasm: Secondary | ICD-10-CM

## 2019-11-17 DIAGNOSIS — M542 Cervicalgia: Secondary | ICD-10-CM | POA: Diagnosis not present

## 2019-11-17 DIAGNOSIS — M5442 Lumbago with sciatica, left side: Secondary | ICD-10-CM | POA: Diagnosis not present

## 2019-11-17 NOTE — Therapy (Signed)
[place] [address] [place], [place], [address] Phone: [phone]   Fax:  [phone] Progress Note Reporting Period 09/17/19 to 11/17/19 for visits 11-20 See note below for Objective Data and Assessment of Progress/Goals.      Physical Therapy Treatment  Patient Details  Name: [name] MRN: [id number] Date of Birth: [birth year] Referring Provider (PT): Durward Fortes   Encounter Date: 11/17/2019  PT End of Session - 11/17/19 1100    Visit Number  20    Date for PT Re-Evaluation  12/10/19    PT Start Time  8588    PT Stop Time  1114    PT Time Calculation (min)  59 min    Activity Tolerance  Patient tolerated treatment well    Behavior During Therapy  [facility] for tasks assessed/performed       Past Medical History:  Diagnosis Date  . Anxiety   . Arthritis HANDS, NECK , BACK  . Asymptomatic carotid artery stenosis LEFT --  MOD. PER DR WILLIS NOTE OCT 2012  . Benign heart murmur   . Depression    PMH of  . Diabetes mellitus ORAL MED  . DVT (deep venous thrombosis) ([place]) 2006   after arthroscopy  . Frequency of urination   . History of CVA (cerebrovascular accident) NEUROLOGIST  DR WILLIS----  06-01-2010-  LEFT BASAL GANGLIA HEMORRAGE   RESIDUAL RIGHT HAND NUMBNESS/TINGLING  . Hyperlipidemia   . Hypertension   . Hypothyroid   . MRSA (methicillin resistant staph aureus) culture positive    X3; last post TKR  . Nocturia   . Numbness and tingling in right hand RESIDUAL FROM CVA NOV 2011  . Obesity    Redux therapy  . Osteoporosis   . Thyroid disease    right thyroid nodule-- BX DONE 08-15-2011    Past Surgical History:  Procedure Laterality Date  . ABDOMINAL HYSTERECTOMY  1973   Endometriosis & fibroid  . CARPAL TUNNEL RELEASE  2000   RIGHT  . COLONOSCOPY  1999 & 2005   Dr Olevia Perches  . cystoscope     to evaluate recurrent UTIs  . CYSTOSCOPY W/ RETROGRADES  08/22/2011   Procedure: CYSTOSCOPY WITH  RETROGRADE PYELOGRAM;  Surgeon: Molli Hazard, MD;  Location: [facility];  Service: Urology;  Laterality: Bilateral;  . CYSTOSCOPY WITH BIOPSY  08/22/2011   Procedure: CYSTOSCOPY WITH BIOPSY;  Surgeon: Molli Hazard, MD;  Location: [facility];  Service: Urology;  Laterality: N/A;  . g2 p2    . JOINT REPLACEMENT  03-28-2006   LEFT THUMB  . KNEE ARTHROSCOPY     BILATERAL PRIOR TO TOTAL KNEE  . LEFT SHOULDER SURG  1990'S  . LUMBAR LAMINECTOMY  08-26-2009   L 4 - 5  . THYROID NODULE BX  08-15-2011   RIGHT  . TOTAL KNEE ARTHROPLASTY  05-26-2002      LEFT   AND RIGHT  03-30-2098  . WRIST SURGERY  2014   tendon placed in joint; Dr Burney Gauze    There were no vitals filed for this visit.  Subjective Assessment - 11/17/19 1020    Subjective  "Pretty good, back is better, no headache in over a week" Lover back issues at times but it has gotten better    Pertinent History  CVA, Total joint replacements    Limitations  Reading;House hold activities    Diagnostic tests  negative except for DDD and arthritis  Patient Stated Goals  have less pain    Currently in Pain?  No/denies         [facility] PT Assessment - 11/17/19 0001      AROM   Overall AROM Comments  Lumbar ROM decreased 25% extension, WFL's all other motions                    [facility] Adult PT Treatment/Exercise - 11/17/19 0001      Lumbar Exercises: Stretches   Single Knee to Chest Stretch  Left;Right;3 reps;10 seconds    Double Knee to Chest Stretch  10 seconds;3 reps    Lower Trunk Rotation  3 reps;10 seconds    Other Lumbar Stretch Exercise  piriformis stretch 3 x10 each      Lumbar Exercises: Aerobic   UBE (Upper Arm Bike)  L4 x 2 min each     Recumbent Bike  L0 x 5 min       Lumbar Exercises: Machines for Strengthening   Cybex Knee Extension  10lb 2x10     Cybex Knee Flexion  25lb 2x10     Other Lumbar Machine Exercise  Rows and lats 20lb 2x15      Lumbar  Exercises: Standing   Shoulder Extension  Both;Power Tower;Strengthening;20 reps    Shoulder Extension Limitations  10      Moist Heat Therapy   Number Minutes Moist Heat  15 Minutes    Moist Heat Location  Lumbar Spine      Electrical Stimulation   Electrical Stimulation Location  Lumbar spine    Electrical Stimulation Action  IFC    Electrical Stimulation Parameters  supine    Electrical Stimulation Goals  Pain               PT Short Term Goals - 10/20/19 1101      PT SHORT TERM GOAL #1   Title  independent iwth initial HEP    Status  Achieved        PT Long Term Goals - 11/17/19 1111      PT LONG TERM GOAL #1   Title  decrease HA's 50%    Status  Achieved      PT LONG TERM GOAL #2   Title  decrease pain 50%    Status  Achieved      PT LONG TERM GOAL #3   Title  increase cervical ROM 25%    Status  Partially Met      PT LONG TERM GOAL #4   Title  understand posture and body mechanics    Status  Achieved      PT LONG TERM GOAL #5   Title  work in the garden without increase of pain    Status  Partially Met      PT LONG TERM GOAL #6   Title  decrease LBP 50%      PT LONG TERM GOAL #7   Title  report that she is able to go up and down stairs step over step without difficulty and not fatigue with normal walking    Status  On-going            Plan - 11/17/19 1102    Clinical Impression Statement  Pt has progressed increasing her lumbar ROM. Improvement reported overall but still has some low back pan at times. Tactile cues needed for posture with seated rows and standing shoulder Ext. Good Hip flexibility. Increase resistance tolerated with leg curls and  extensions. Reports no headaches in the last week. She    Personal Factors and Comorbidities  Comorbidity 3+    Comorbidities  arthritis, TKA's, CVA    Stability/Clinical Decision Making  Stable/Uncomplicated    PT Frequency  2x / week    PT Duration  8 weeks    PT Next Visit Plan  Core  strengthening.       Patient will benefit from skilled therapeutic intervention in order to improve the following deficits and impairments:  Pain, Improper body mechanics, Increased muscle spasms, Postural dysfunction, Decreased strength, Decreased range of motion  Visit Diagnosis: Cervicalgia  Cramp and spasm  Acute left-sided low back pain with left-sided sciatica     Problem List Patient Active Problem List   Diagnosis Date Noted  . Low back pain 10/29/2019  . Pain in left wrist 10/01/2019  . Cervicalgia 07/29/2019  . Trigger finger, left middle finger 04/28/2018  . Hypothyroidism 03/29/2017  . Bilateral carotid artery stenosis 09/25/2016  . Generalized anxiety disorder 06/20/2015  . SUI (stress urinary incontinence, female) 02/05/2014  . 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 Cerebral artery occlusion with cerebral infarction ([place]) 05/31/2010  . DM (diabetes mellitus), secondary, uncontrolled, with renal complications ([place]) 86/77/3736  . Dysuria 01/24/2010  . THYROID NODULE, RIGHT 01/21/2009  . Vitamin D deficiency 12/10/2007  . HYPERLIPIDEMIA 06/17/2007  . Essential hypertension 06/17/2007  . Osteopenia 06/17/2007  . Osteoarthrosis, unspecified whether generalized or localized, unspecified site 11/11/2006    Scot Jun, PTA 11/17/2019, 11:12 AM  [place], [place], [address] Phone: [phone]   Fax:  [phone]  Name: [name] MRN: [id number] Date of Birth: 1940-12-21

## 2019-11-17 NOTE — Patient Instructions (Signed)
Access Code: [email or web address] URL: [email or web address] Date: 11/17/2019 Prepared by: Cheri Fowler  Exercises Supine Bridge - 1 x daily - 7 x weekly - 10 reps - 3 sets Standing Row with Resistance - 1 x daily - 7 x weekly - 10 reps - 3 sets Shoulder Extension with Resistance - 1 x daily - 7 x weekly - 10 reps - 3 sets

## 2019-11-19 ENCOUNTER — Ambulatory Visit: Payer: Medicare Other | Admitting: Physical Therapy

## 2019-11-23 ENCOUNTER — Other Ambulatory Visit: Payer: Self-pay | Admitting: Internal Medicine

## 2019-11-24 ENCOUNTER — Ambulatory Visit: Payer: Medicare Other | Attending: Orthopaedic Surgery | Admitting: Physical Therapy

## 2019-11-24 ENCOUNTER — Encounter: Payer: Self-pay | Admitting: Physical Therapy

## 2019-11-24 ENCOUNTER — Other Ambulatory Visit: Payer: Self-pay

## 2019-11-24 DIAGNOSIS — M5442 Lumbago with sciatica, left side: Secondary | ICD-10-CM | POA: Diagnosis not present

## 2019-11-24 DIAGNOSIS — R252 Cramp and spasm: Secondary | ICD-10-CM | POA: Insufficient documentation

## 2019-11-24 DIAGNOSIS — M542 Cervicalgia: Secondary | ICD-10-CM | POA: Diagnosis not present

## 2019-11-24 NOTE — Therapy (Signed)
[place], [place], [address] Phone: [phone]   Fax:  [phone]  Physical Therapy Treatment  Patient Details  Name: [name] MRN: [id number] Date of Birth: [birth year] Referring Provider (PT): Durward Fortes   Encounter Date: 11/24/2019  PT End of Session - 11/24/19 1427    Visit Number  21    Date for PT Re-Evaluation  12/10/19    PT Start Time  1350    PT Stop Time  1444    PT Time Calculation (min)  54 min    Activity Tolerance  Patient tolerated treatment well    Behavior During Therapy  [facility] for tasks assessed/performed       Past Medical History:  Diagnosis Date  . Anxiety   . Arthritis HANDS, NECK , BACK  . Asymptomatic carotid artery stenosis LEFT --  MOD. PER DR WILLIS NOTE OCT 2012  . Benign heart murmur   . Depression    PMH of  . Diabetes mellitus ORAL MED  . DVT (deep venous thrombosis) ([place]) 2006   after arthroscopy  . Frequency of urination   . History of CVA (cerebrovascular accident) NEUROLOGIST  DR WILLIS----  06-01-2010-  LEFT BASAL GANGLIA HEMORRAGE   RESIDUAL RIGHT HAND NUMBNESS/TINGLING  . Hyperlipidemia   . Hypertension   . Hypothyroid   . MRSA (methicillin resistant staph aureus) culture positive    X3; last post TKR  . Nocturia   . Numbness and tingling in right hand RESIDUAL FROM CVA NOV 2011  . Obesity    Redux therapy  . Osteoporosis   . Thyroid disease    right thyroid nodule-- BX DONE 08-15-2011    Past Surgical History:  Procedure Laterality Date  . ABDOMINAL HYSTERECTOMY  1973   Endometriosis & fibroid  . CARPAL TUNNEL RELEASE  2000   RIGHT  . COLONOSCOPY  1999 & 2005   Dr Olevia Perches  . cystoscope     to evaluate recurrent UTIs  . CYSTOSCOPY W/ RETROGRADES  08/22/2011   Procedure: CYSTOSCOPY WITH RETROGRADE PYELOGRAM;  Surgeon: Molli Hazard, MD;  Location: [facility];  Service: Urology;  Laterality: Bilateral;  .  CYSTOSCOPY WITH BIOPSY  08/22/2011   Procedure: CYSTOSCOPY WITH BIOPSY;  Surgeon: Molli Hazard, MD;  Location[facility];  Service: Urology;  Laterality: N/A;  . g2 p2    . JOINT REPLACEMENT  03-28-2006   LEFT THUMB  . KNEE ARTHROSCOPY     BILATERAL PRIOR TO TOTAL KNEE  . LEFT SHOULDER SURG  1990'S  . LUMBAR LAMINECTOMY  08-26-2009   L 4 - 5  . THYROID NODULE BX  08-15-2011   RIGHT  . TOTAL KNEE ARTHROPLASTY  05-26-2002      LEFT   AND RIGHT  03-30-2098  . WRIST SURGERY  2014   tendon placed in joint; Dr Burney Gauze    There were no vitals filed for this visit.  Subjective Assessment - 11/24/19 1352    Subjective  Feeling pretty good, lower back still bothers her some, head and neck is much better    Currently in Pain?  Yes    Pain Score  1     Pain Location  Back    Pain Orientation  Lower         [facility] PT Assessment - 11/24/19 0001      AROM   Overall AROM Comments  Lumbar ROM [facility]  [facility] Adult PT Treatment/Exercise - 11/24/19 0001      Lumbar Exercises: Aerobic   UBE (Upper Arm Bike)  L4 x 2 min each     Nustep  L5 x 6 min       Lumbar Exercises: Machines for Strengthening   Cybex Knee Extension  10lb 2x10     Cybex Knee Flexion  25lb 2x10     Other Lumbar Machine Exercise  Rows and lats 20lb 2x15      Lumbar Exercises: Standing   Shoulder Extension  Both;Power Tower;Strengthening;15 reps   x2   Shoulder Extension Limitations  5    Other Standing Lumbar Exercises  Overhead Ext red ball 2x10       Moist Heat Therapy   Number Minutes Moist Heat  15 Minutes    Moist Heat Location  Lumbar Spine      Electrical Stimulation   Electrical Stimulation Location  Lumbar spine    Electrical Stimulation Action  IFC    Electrical Stimulation Parameters  supine    Electrical Stimulation Goals  Pain               PT Short Term Goals - 10/20/19 1101      PT SHORT TERM GOAL #1   Title  independent iwth  initial HEP    Status  Achieved        PT Long Term Goals - 11/24/19 1428      PT LONG TERM GOAL #3   Title  increase cervical ROM 25%    Status  Achieved            Plan - 11/24/19 1428    Clinical Impression Statement  Pt ~ 5 minutes late. Pt has progressed meeting goals increasing her lumbar ROM. She reports overall improvement. Tactile cues needed to the lower back for overhead EXT to promote lumbar Et. Postural cues needed to keep core tight with shoulder extensions    Personal Factors and Comorbidities  Comorbidity 3+    Comorbidities  arthritis, TKA's, CVA    Stability/Clinical Decision Making  Stable/Uncomplicated    Rehab Potential  Good    PT Frequency  2x / week    PT Treatment/Interventions  ADLs/Self Care Home Management;Traction;Ultrasound;Moist Heat;Electrical Stimulation;Manual techniques;Therapeutic exercise;Therapeutic activities;Patient/family education;Dry needling    PT Next Visit Plan  Core strengthening.       Patient will benefit from skilled therapeutic intervention in order to improve the following deficits and impairments:  Pain, Improper body mechanics, Increased muscle spasms, Postural dysfunction, Decreased strength, Decreased range of motion  Visit Diagnosis: Cervicalgia  Cramp and spasm  Acute left-sided low back pain with left-sided sciatica     Problem List Patient Active Problem List   Diagnosis Date Noted  . Low back pain 10/29/2019  . Pain in left wrist 10/01/2019  . Cervicalgia 07/29/2019  . Trigger finger, left middle finger 04/28/2018  . Hypothyroidism 03/29/2017  . Bilateral carotid artery stenosis 09/25/2016  . Generalized anxiety disorder 06/20/2015  . SUI (stress urinary incontinence, female) 02/05/2014  . 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 Cerebral artery occlusion with cerebral infarction ([place]) 05/31/2010  . DM (diabetes mellitus), secondary, uncontrolled, with renal complications ([place]) [id number]  . Dysuria  01/24/2010  . THYROID NODULE, RIGHT 01/21/2009  . Vitamin D deficiency 12/10/2007  . HYPERLIPIDEMIA 06/17/2007  . Essential hypertension 06/17/2007  . Osteopenia 06/17/2007  . Osteoarthrosis, unspecified whether generalized or localized, unspecified site 11/11/2006    Scot Jun, PTA 11/24/2019, 2:30  PM  [place], [place], [address] Phone: [phone]   Fax:  [phone]  Name: [name] MRN: [id number] Date of Birth: 1941/01/27

## 2019-11-29 ENCOUNTER — Other Ambulatory Visit: Payer: Self-pay | Admitting: Internal Medicine

## 2019-12-01 ENCOUNTER — Other Ambulatory Visit: Payer: Self-pay

## 2019-12-01 ENCOUNTER — Encounter: Payer: Self-pay | Admitting: Physical Therapy

## 2019-12-01 ENCOUNTER — Ambulatory Visit: Payer: Medicare Other | Admitting: Physical Therapy

## 2019-12-01 DIAGNOSIS — R252 Cramp and spasm: Secondary | ICD-10-CM

## 2019-12-01 DIAGNOSIS — M5442 Lumbago with sciatica, left side: Secondary | ICD-10-CM

## 2019-12-01 DIAGNOSIS — M542 Cervicalgia: Secondary | ICD-10-CM | POA: Diagnosis not present

## 2019-12-01 NOTE — Therapy (Signed)
[place], [place], [address] Phone: [phone]   Fax:  [phone]  Physical Therapy Treatment  Patient Details  Name: [name] MRN: [id number] Date of Birth: [birth year] Referring Provider (PT): Durward Fortes   Encounter Date: 12/01/2019  PT End of Session - 12/01/19 1513    Visit Number  22    Date for PT Re-Evaluation  12/10/19    PT Start Time  1430    PT Stop Time  1527    PT Time Calculation (min)  57 min    Activity Tolerance  Patient tolerated treatment well    Behavior During Therapy  [facility] for tasks assessed/performed       Past Medical History:  Diagnosis Date  . Anxiety   . Arthritis HANDS, NECK , BACK  . Asymptomatic carotid artery stenosis LEFT --  MOD. PER DR WILLIS NOTE OCT 2012  . Benign heart murmur   . Depression    PMH of  . Diabetes mellitus ORAL MED  . DVT (deep venous thrombosis) ([place]) 2006   after arthroscopy  . Frequency of urination   . History of CVA (cerebrovascular accident) NEUROLOGIST  DR WILLIS----  06-01-2010-  LEFT BASAL GANGLIA HEMORRAGE   RESIDUAL RIGHT HAND NUMBNESS/TINGLING  . Hyperlipidemia   . Hypertension   . Hypothyroid   . MRSA (methicillin resistant staph aureus) culture positive    X3; last post TKR  . Nocturia   . Numbness and tingling in right hand RESIDUAL FROM CVA NOV 2011  . Obesity    Redux therapy  . Osteoporosis   . Thyroid disease    right thyroid nodule-- BX DONE 08-15-2011    Past Surgical History:  Procedure Laterality Date  . ABDOMINAL HYSTERECTOMY  1973   Endometriosis & fibroid  . CARPAL TUNNEL RELEASE  2000   RIGHT  . COLONOSCOPY  1999 & 2005   Dr Olevia Perches  . cystoscope     to evaluate recurrent UTIs  . CYSTOSCOPY W/ RETROGRADES  08/22/2011   Procedure: CYSTOSCOPY WITH RETROGRADE PYELOGRAM;  Surgeon: Molli Hazard, MD;  Location[facility];  Service: Urology;  Laterality: Bilateral;  .  CYSTOSCOPY WITH BIOPSY  08/22/2011   Procedure: CYSTOSCOPY WITH BIOPSY;  Surgeon: Molli Hazard, MD;  Location: [facility];  Service: Urology;  Laterality: N/A;  . g2 p2    . JOINT REPLACEMENT  03-28-2006   LEFT THUMB  . KNEE ARTHROSCOPY     BILATERAL PRIOR TO TOTAL KNEE  . LEFT SHOULDER SURG  1990'S  . LUMBAR LAMINECTOMY  08-26-2009   L 4 - 5  . THYROID NODULE BX  08-15-2011   RIGHT  . TOTAL KNEE ARTHROPLASTY  05-26-2002      LEFT   AND RIGHT  03-30-2098  . WRIST SURGERY  2014   tendon placed in joint; Dr Burney Gauze    There were no vitals filed for this visit.  Subjective Assessment - 12/01/19 1435    Subjective  "I am feeling good"    Currently in Pain?  No/denies                       [facility] Adult PT Treatment/Exercise - 12/01/19 0001      Lumbar Exercises: Aerobic   UBE (Upper Arm Bike)  L4 x 2 min each     Nustep  L5 x 6 min  Lumbar Exercises: Machines for Strengthening   Cybex Knee Extension  10lb 2x10     Cybex Knee Flexion  25lb 2x15    Leg Press  20lb 2x10     Other Lumbar Machine Exercise  Rows and lats 25lb 2x10      Lumbar Exercises: Standing   Shoulder Extension  Both;Power Tower;Strengthening;20 reps    Shoulder Extension Limitations  5    Other Standing Lumbar Exercises  Overhead Ext yellow ball 2x10       Moist Heat Therapy   Number Minutes Moist Heat  15 Minutes    Moist Heat Location  Lumbar Spine      Electrical Stimulation   Electrical Stimulation Location  Lumbar spine    Electrical Stimulation Action  IFC    Electrical Stimulation Parameters  supine    Electrical Stimulation Goals  Pain               PT Short Term Goals - 10/20/19 1101      PT SHORT TERM GOAL #1   Title  independent iwth initial HEP    Status  Achieved        PT Long Term Goals - 12/01/19 1445      PT LONG TERM GOAL #5   Title  work in the garden without increase of pain    Status  Partially Met      PT LONG  TERM GOAL #6   Title  decrease LBP 50%    Status  On-going   hurts more at night           Plan - 12/01/19 1513    Clinical Impression Statement  Pt report that she is doing well overall but does have back pain at night. She also reports that's he has been working in the garden but she does not due much due to it causing some back pain. She does use a stool when gardening. Good strength I today's session increase reps tolerated with leg curls and extensions. Tactile cues provided to prevent postural sway with seated rows.    Personal Factors and Comorbidities  Comorbidity 3+    Comorbidities  arthritis, TKA's, CVA    Stability/Clinical Decision Making  Stable/Uncomplicated    Rehab Potential  Good    PT Frequency  2x / week    PT Duration  8 weeks    PT Treatment/Interventions  ADLs/Self Care Home Management;Traction;Ultrasound;Moist Heat;Electrical Stimulation;Manual techniques;Therapeutic exercise;Therapeutic activities;Patient/family education;Dry needling    PT Next Visit Plan  Core strengthening. possible D/C       Patient will benefit from skilled therapeutic intervention in order to improve the following deficits and impairments:  Pain, Improper body mechanics, Increased muscle spasms, Postural dysfunction, Decreased strength, Decreased range of motion  Visit Diagnosis: Cramp and spasm  Cervicalgia  Acute left-sided low back pain with left-sided sciatica     Problem List Patient Active Problem List   Diagnosis Date Noted  . Low back pain 10/29/2019  . Pain in left wrist 10/01/2019  . Cervicalgia 07/29/2019  . Trigger finger, left middle finger 04/28/2018  . Hypothyroidism 03/29/2017  . Bilateral carotid artery stenosis 09/25/2016  . Generalized anxiety disorder 06/20/2015  . SUI (stress urinary incontinence, female) 02/05/2014  . 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 Cerebral artery occlusion with cerebral infarction ([place]) 05/31/2010  . DM (diabetes  mellitus), secondary, uncontrolled, with renal complications ([place]) 09/47/0962  . Dysuria 01/24/2010  . THYROID NODULE, RIGHT 01/21/2009  . Vitamin D deficiency 12/10/2007  .  HYPERLIPIDEMIA 06/17/2007  . Essential hypertension 06/17/2007  . Osteopenia 06/17/2007  . Osteoarthrosis, unspecified whether generalized or localized, unspecified site 11/11/2006    Scot Jun 12/01/2019, 3:16 PM  [place], [place], [address] Phone: [phone]   Fax:  [phone]  Name: ZLATY ALEXA MRN: [id number] Date of Birth: 1941-03-14

## 2019-12-02 ENCOUNTER — Encounter: Payer: Self-pay | Admitting: Orthopaedic Surgery

## 2019-12-02 ENCOUNTER — Ambulatory Visit (INDEPENDENT_AMBULATORY_CARE_PROVIDER_SITE_OTHER): Payer: Medicare Other | Admitting: Orthopaedic Surgery

## 2019-12-02 VITALS — Ht 66.0 in | Wt 169.0 lb

## 2019-12-02 DIAGNOSIS — M545 Low back pain, unspecified: Secondary | ICD-10-CM

## 2019-12-02 DIAGNOSIS — G8929 Other chronic pain: Secondary | ICD-10-CM | POA: Diagnosis not present
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
## 2019-12-08 ENCOUNTER — Other Ambulatory Visit: Payer: Self-pay

## 2019-12-08 ENCOUNTER — Ambulatory Visit: Payer: Medicare Other | Admitting: Physical Therapy

## 2019-12-08 ENCOUNTER — Encounter: Payer: Self-pay | Admitting: Physical Therapy

## 2019-12-08 DIAGNOSIS — R252 Cramp and spasm: Secondary | ICD-10-CM

## 2019-12-08 DIAGNOSIS — M5442 Lumbago with sciatica, left side: Secondary | ICD-10-CM | POA: Diagnosis not present

## 2019-12-08 DIAGNOSIS — M542 Cervicalgia: Secondary | ICD-10-CM | POA: Diagnosis not present

## 2019-12-08 NOTE — Therapy (Signed)
[place], [place], [address] Phone: [phone]   Fax:  [phone]  Physical Therapy Treatment  Patient Details  Name: [name] MRN: [id number] Date of Birth: 1941-06-18 Referring Provider (PT): Durward Fortes   Encounter Date: 12/08/2019  PT End of Session - 12/08/19 1225    Visit Number  23    Date for PT Re-Evaluation  12/10/19    PT Start Time  1156    PT Stop Time  1237    PT Time Calculation (min)  41 min    Activity Tolerance  Patient tolerated treatment well    Behavior During Therapy  [facility] for tasks assessed/performed       Past Medical History:  Diagnosis Date  . Anxiety   . Arthritis HANDS, NECK , BACK  . Asymptomatic carotid artery stenosis LEFT --  MOD. PER DR WILLIS NOTE OCT 2012  . Benign heart murmur   . Depression    PMH of  . Diabetes mellitus ORAL MED  . DVT (deep venous thrombosis) ([place]) 2006   after arthroscopy  . Frequency of urination   . History of CVA (cerebrovascular accident) NEUROLOGIST  DR WILLIS----  06-01-2010-  LEFT BASAL GANGLIA HEMORRAGE   RESIDUAL RIGHT HAND NUMBNESS/TINGLING  . Hyperlipidemia   . Hypertension   . Hypothyroid   . MRSA (methicillin resistant staph aureus) culture positive    X3; last post TKR  . Nocturia   . Numbness and tingling in right hand RESIDUAL FROM CVA NOV 2011  . Obesity    Redux therapy  . Osteoporosis   . Thyroid disease    right thyroid nodule-- BX DONE 08-15-2011    Past Surgical History:  Procedure Laterality Date  . ABDOMINAL HYSTERECTOMY  1973   Endometriosis & fibroid  . CARPAL TUNNEL RELEASE  2000   RIGHT  . COLONOSCOPY  1999 & 2005   Dr Olevia Perches  . cystoscope     to evaluate recurrent UTIs  . CYSTOSCOPY W/ RETROGRADES  08/22/2011   Procedure: CYSTOSCOPY WITH RETROGRADE PYELOGRAM;  Surgeon: Molli Hazard, MD;  [facility];  Service: Urology;  Laterality: Bilateral;  .  CYSTOSCOPY WITH BIOPSY  08/22/2011   Procedure: CYSTOSCOPY WITH BIOPSY;  Surgeon: Molli Hazard, MD;  Location: [facility];  Service: Urology;  Laterality: N/A;  . g2 p2    . JOINT REPLACEMENT  03-28-2006   LEFT THUMB  . KNEE ARTHROSCOPY     BILATERAL PRIOR TO TOTAL KNEE  . LEFT SHOULDER SURG  1990'S  . LUMBAR LAMINECTOMY  08-26-2009   L 4 - 5  . THYROID NODULE BX  08-15-2011   RIGHT  . TOTAL KNEE ARTHROPLASTY  05-26-2002      LEFT   AND RIGHT  03-30-2098  . WRIST SURGERY  2014   tendon placed in joint; Dr Burney Gauze    There were no vitals filed for this visit.  Subjective Assessment - 12/08/19 1157    Subjective  "Pretty good" No pain, back feels tired    Currently in Pain?  No/denies                        [facility] Adult PT Treatment/Exercise - 12/08/19 0001      Lumbar Exercises: Aerobic   Nustep  L4 x 6 min       Lumbar Exercises: Machines for Strengthening   Cybex Knee Extension  10lb 2x10     Cybex Knee Flexion  25lb 2x15    Leg Press  20lb 2x10     Other Lumbar Machine Exercise  Rows and lats 25lb 2x10      Moist Heat Therapy   Number Minutes Moist Heat  15 Minutes    Moist Heat Location  Lumbar Spine      Electrical Stimulation   Electrical Stimulation Location  Lumbar spine    Electrical Stimulation Action  IFC    Electrical Stimulation Parameters  spine    Electrical Stimulation Goals  Pain               PT Short Term Goals - 10/20/19 1101      PT SHORT TERM GOAL #1   Title  independent iwth initial HEP    Status  Achieved        PT Long Term Goals - 12/01/19 1445      PT LONG TERM GOAL #5   Title  work in the garden without increase of pain    Status  Partially Met      PT LONG TERM GOAL #6   Title  decrease LBP 50%    Status  On-going   hurts more at night           Plan - 12/08/19 1227    Clinical Impression Statement  Pt just returned from the beach and reports doing well. She was  able to walk alone the beach without issue. No reports of increase pain with today's activities. Tactile cue to maintain good posture with seated rows.    Personal Factors and Comorbidities  Comorbidity 3+    Comorbidities  arthritis, TKA's, CVA    Stability/Clinical Decision Making  Stable/Uncomplicated    Rehab Potential  Good    PT Frequency  2x / week    PT Duration  8 weeks    PT Treatment/Interventions  ADLs/Self Care Home Management;Traction;Ultrasound;Moist Heat;Electrical Stimulation;Manual techniques;Therapeutic exercise;Therapeutic activities;Patient/family education;Dry needling    PT Next Visit Plan  D/C next visit       Patient will benefit from skilled therapeutic intervention in order to improve the following deficits and impairments:  Pain, Improper body mechanics, Increased muscle spasms, Postural dysfunction, Decreased strength, Decreased range of motion  Visit Diagnosis: Cervicalgia  Cramp and spasm  Acute left-sided low back pain with left-sided sciatica     Problem List Patient Active Problem List   Diagnosis Date Noted  . Low back pain 10/29/2019  . Pain in left wrist 10/01/2019  . Cervicalgia 07/29/2019  . Trigger finger, left middle finger 04/28/2018  . Hypothyroidism 03/29/2017  . Bilateral carotid artery stenosis 09/25/2016  . Generalized anxiety disorder 06/20/2015  . SUI (stress urinary incontinence, female) 02/05/2014  . 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 Cerebral artery occlusion with cerebral infarction ([place]) 05/31/2010  . DM (diabetes mellitus), secondary, uncontrolled, with renal complications ([place]) 91/91/6606  . Dysuria 01/24/2010  . THYROID NODULE, RIGHT 01/21/2009  . Vitamin D deficiency 12/10/2007  . HYPERLIPIDEMIA 06/17/2007  . Essential hypertension 06/17/2007  . Osteopenia 06/17/2007  . Osteoarthrosis, unspecified whether generalized or localized, unspecified site 11/11/2006    Scot Jun, PTA 12/08/2019, 12:29  PM  [place], [place], [address] Phone: [phone]   Fax:  [phone]  Name: [name] MRN: [id number] Date of Birth: 05-09-1941

## 2019-12-10 ENCOUNTER — Ambulatory Visit: Payer: Medicare Other | Admitting: Physical Therapy

## 2019-12-10 ENCOUNTER — Telehealth: Payer: Self-pay

## 2019-12-10 ENCOUNTER — Encounter: Payer: Self-pay | Admitting: Physical Therapy

## 2019-12-10 ENCOUNTER — Other Ambulatory Visit: Payer: Self-pay

## 2019-12-10 DIAGNOSIS — M5442 Lumbago with sciatica, left side: Secondary | ICD-10-CM

## 2019-12-10 DIAGNOSIS — R252 Cramp and spasm: Secondary | ICD-10-CM

## 2019-12-10 DIAGNOSIS — M542 Cervicalgia: Secondary | ICD-10-CM

## 2019-12-10 NOTE — Therapy (Signed)
[facility] [address] [place], [name], [address] Phone: [phone]   Fax:  [phone]  Physical Therapy Treatment  Patient Details  Name: [name] MRN: [id number] Date of Birth: 01/18/1941 Referring Provider (PT): [place]field   Encounter Date: 12/10/2019  PT End of Session - 12/10/19 1225    Visit Number  24    Date for PT Re-Evaluation  12/10/19    PT Start Time  1145    PT Stop Time  1226    PT Time Calculation (min)  41 min       Past Medical History:  Diagnosis Date  . Anxiety   . Arthritis HANDS, NECK , BACK  . Asymptomatic carotid artery stenosis LEFT --  MOD. PER DR WILLIS NOTE OCT 2012  . Benign heart murmur   . Depression    PMH of  . Diabetes mellitus ORAL MED  . DVT (deep venous thrombosis) (HCC) 2006   after arthroscopy  . Frequency of urination   . History of CVA (cerebrovascular accident) NEUROLOGIST  DR WILLIS----  06-01-2010-  LEFT BASAL GANGLIA HEMORRAGE   RESIDUAL RIGHT HAND NUMBNESS/TINGLING  . Hyperlipidemia   . Hypertension   . Hypothyroid   . MRSA (methicillin resistant staph aureus) culture positive    X3; last post TKR  . Nocturia   . Numbness and tingling in right hand RESIDUAL FROM CVA NOV 2011  . Obesity    Redux therapy  . Osteoporosis   . Thyroid disease    right thyroid nodule-- BX DONE 08-15-2011    Past Surgical History:  Procedure Laterality Date  . ABDOMINAL HYSTERECTOMY  1973   Endometriosis & fibroid  . CARPAL TUNNEL RELEASE  2000   RIGHT  . COLONOSCOPY  1999 & 2005   Dr Brodie  . cystoscope     to evaluate recurrent UTIs  . CYSTOSCOPY W/ RETROGRADES  08/22/2011   Procedure: CYSTOSCOPY WITH RETROGRADE PYELOGRAM;  Surgeon: Daniel Young Woodruff, MD;  Location: [facility];  Service: Urology;  Laterality: Bilateral;  . CYSTOSCOPY WITH BIOPSY  08/22/2011   Procedure: CYSTOSCOPY WITH BIOPSY;  Surgeon: Daniel Young Woodruff, MD;  Location:  [facility];  Service: Urology;  Laterality: N/A;  . g2 p2    . JOINT REPLACEMENT  03-28-2006   LEFT THUMB  . KNEE ARTHROSCOPY     BILATERAL PRIOR TO TOTAL KNEE  . LEFT SHOULDER SURG  1990'S  . LUMBAR LAMINECTOMY  08-26-2009   L 4 - 5  . THYROID NODULE BX  08-15-2011   RIGHT  . TOTAL KNEE ARTHROPLASTY  05-26-2002      LEFT   AND RIGHT  03-30-2098  . WRIST SURGERY  2014   tendon placed in joint; Dr Weingold    There were no vitals filed for this visit.  Subjective Assessment - 12/10/19 1153    Subjective  "Pretty good" "Back feels a little weak, I feel tired all the time"    Currently in Pain?  No/denies                        [facility] Adult PT Treatment/Exercise - 12/10/19 0001      Lumbar Exercises: Aerobic   UBE (Upper Arm Bike)  L4 x 2 min each     Nustep  L4 x 6 min       Lumbar Exercises: Machines for Strengthening   Cybex Lumbar Extension    black band 2x10     Cybex Knee Extension  10lb 2x10     Cybex Knee Flexion  25lb 2x15    Leg Press  30lb 2x10     Other Lumbar Machine Exercise  Rows and lats 25lb 2x10      Moist Heat Therapy   Number Minutes Moist Heat  15 Minutes    Moist Heat Location  Lumbar Spine;Cervical;Shoulder      Electrical Stimulation   Electrical Stimulation Location  Lumbar spine, L shoulder    Electrical Stimulation Action  premod    Electrical Stimulation Parameters  sititng    Electrical Stimulation Goals  Pain               PT Short Term Goals - 10/20/19 1101      PT SHORT TERM GOAL #1   Title  independent iwth initial HEP    Status  Achieved        PT Long Term Goals - 12/10/19 1156      PT LONG TERM GOAL #1   Title  decrease HA's 50%    Status  Achieved      PT LONG TERM GOAL #2   Title  decrease pain 50%    Status  Achieved      PT LONG TERM GOAL #3   Title  increase cervical ROM 25%    Status  Achieved      PT LONG TERM GOAL #4   Title  understand posture and body mechanics     Status  Achieved      PT LONG TERM GOAL #5   Title  work in the garden without increase of pain    Status  Achieved      PT LONG TERM GOAL #6   Title  decrease LBP 50%    Status  Achieved            Plan - 12/10/19 1227    Clinical Impression Statement  Pt has progressed meeting all goals. Reports fatigue from gardening. No issues with today's interventions.    Personal Factors and Comorbidities  Comorbidity 3+    Comorbidities  arthritis, TKA's, CVA    Stability/Clinical Decision Making  Stable/Uncomplicated    Rehab Potential  Good    PT Frequency  2x / week    PT Duration  8 weeks    PT Treatment/Interventions  ADLs/Self Care Home Management;Traction;Ultrasound;Moist Heat;Electrical Stimulation;Manual techniques;Therapeutic exercise;Therapeutic activities;Patient/family education;Dry needling    PT Next Visit Plan  D/C PT       Patient will benefit from skilled therapeutic intervention in order to improve the following deficits and impairments:  Pain, Improper body mechanics, Increased muscle spasms, Postural dysfunction, Decreased strength, Decreased range of motion  Visit Diagnosis: Acute left-sided low back pain with left-sided sciatica  Cramp and spasm  Cervicalgia     Problem List Patient Active Problem List   Diagnosis Date Noted  . Low back pain 10/29/2019  . Pain in left wrist 10/01/2019  . Cervicalgia 07/29/2019  . Trigger finger, left middle finger 04/28/2018  . Hypothyroidism 03/29/2017  . Bilateral carotid artery stenosis 09/25/2016  . Generalized anxiety disorder 06/20/2015  . SUI (stress urinary incontinence, female) 02/05/2014  . 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 Cerebral artery occlusion with cerebral infarction (HCC) 05/31/2010  . DM (diabetes mellitus), secondary, uncontrolled, with renal complications (HCC) 01/24/2010  . Dysuria 01/24/2010  . THYROID NODULE, RIGHT 01/21/2009  . Vitamin D deficiency 12/10/2007  . HYPERLIPIDEMIA    06/17/2007  . Essential hypertension 06/17/2007  . Osteopenia 06/17/2007  . Osteoarthrosis, unspecified whether generalized or localized, unspecified site 11/11/2006   PHYSICAL THERAPY DISCHARGE SUMMARY  Visits from Start of Care: 24 Plan: Patient agrees to discharge.  Patient goals were met. Patient is being discharged due to meeting the stated rehab goals.  ?????       Scot Jun , PTA 12/10/2019, 12:28 PM  [facility] [address] [place], [place], [address] Phone: [phone]   Fax:  [phone]  Name: HATSUMI STEINHART MRN: [id number] Date of Birth: January 19, 1941

## 2019-12-10 NOTE — Telephone Encounter (Signed)
1.Medication Requested:clonazePAM (KLONOPIN) 0.5 MG tablet  2. Pharmacy (Name, Street, City):[facility], [place]  3. On Med List: Yes   4. Last Visit with PCP: 2.19.21   5. Next visit date with PCP: 8.18.21    Agent: Please be advised that RX refills may take up to 3 business days. We ask that you follow-up with your pharmacy.

## 2019-12-11 MED ORDER — CLONAZEPAM 0.5 MG PO TABS: ORAL_TABLET | ORAL | 1 refills | Status: DC

## 2019-12-11 NOTE — Telephone Encounter (Signed)
[place] controlled substance database checked.  Ok to fill medication. Medication sent to pharmacy

## 2019-12-30 ENCOUNTER — Ambulatory Visit
Admission: RE | Admit: 2019-12-30 | Discharge: 2019-12-30 | Disposition: A | Discharge status: Home / Self Care | Payer: Medicare Other | Source: Ambulatory Visit | Attending: Orthopaedic Surgery | Admitting: Orthopaedic Surgery

## 2019-12-30 DIAGNOSIS — M48061 Spinal stenosis, lumbar region without neurogenic claudication: Secondary | ICD-10-CM | POA: Diagnosis not present

## 2019-12-30 DIAGNOSIS — G8929 Other chronic pain: Secondary | ICD-10-CM

## 2019-12-30 DIAGNOSIS — M545 Low back pain, unspecified: Secondary | ICD-10-CM

## 2019-12-31 ENCOUNTER — Other Ambulatory Visit: Payer: Medicare Other

## 2020-01-05 ENCOUNTER — Other Ambulatory Visit: Payer: Self-pay

## 2020-01-05 ENCOUNTER — Ambulatory Visit (INDEPENDENT_AMBULATORY_CARE_PROVIDER_SITE_OTHER): Payer: Medicare Other | Admitting: Orthopaedic Surgery

## 2020-01-05 ENCOUNTER — Encounter: Payer: Self-pay | Admitting: Orthopaedic Surgery

## 2020-01-05 VITALS — Ht 66.0 in | Wt 169.0 lb

## 2020-01-05 DIAGNOSIS — G8929 Other chronic pain: Secondary | ICD-10-CM | POA: Diagnosis not present

## 2020-01-05 DIAGNOSIS — M545 Low back pain: Secondary | ICD-10-CM

## 2020-01-05 NOTE — Progress Notes (Signed)
Office Visit Note   Patient: [name]           Date of Birth: 1941-01-02           MRN: [id number] Visit Date: 01/05/2020              Requested by: Binnie Rail, MD [place],  [place] [address] PCP: Binnie Rail, MD   Assessment & Plan: Visit Diagnoses:  1. Chronic left-sided low back pain, unspecified whether sciatica present     Plan: [name] had an MRI scan of her lumbar spine dated 12/31/2019.  There is diffuse lumbar spine spondylosis but no acute injury.  There is significant spinal stenosis at L2-3 and mild to moderate spinal stenosis at L1 to and L4-5.  Also at L3-4 and L5-S1.  Incision was had a course of physical therapy and continues to do her home exercises.  She still has some compromise of her activities with pain in her back and her legs I think it is worth having Dr. Ernestina Patches evaluate her for consideration of an epidural steroid injection.  I discussed this with [name] and she would like to proceed  Follow-Up Instructions: Return if symptoms worsen or fail to improve.   Orders:  Orders Placed This Encounter  Procedures  . Ambulatory referral to Physical Medicine Rehab   No orders of the defined types were placed in this encounter.     Procedures: No procedures performed   Clinical Data: No additional findings.   Subjective: Chief Complaint  Patient presents with  . Lower Back - Follow-up    MRI review  Patient presents today for follow up on her lower back. She had an MRI of her L-Spine on 12-30-2019. She is here today to discuss those results. Patient states that she is doing the same. She takes Ibuprofen and Tylenol for pain.  Has had a full course of physical therapy earlier in the year which has made some difference but still having trouble with her back and both lower extremities particularly at night trying to sleep  HPI  Review of Systems  Constitutional: Positive for fatigue.  HENT: Negative for ear pain.   Eyes:  Negative for pain.  Respiratory: Negative for shortness of breath.   Cardiovascular: Negative for leg swelling.  Gastrointestinal: Negative for constipation and diarrhea.  Endocrine: Negative for cold intolerance and heat intolerance.  Genitourinary: Negative for difficulty urinating.  Musculoskeletal: Negative for joint swelling.  Skin: Negative for rash.  Allergic/Immunologic: Negative for food allergies.  Neurological: Positive for weakness.  Hematological: Does not bruise/bleed easily.  Psychiatric/Behavioral: Positive for sleep disturbance.     Objective: Vital Signs: Ht 5\' 6"  (1.676 m)   Wt 169 lb (76.7 kg)   BMI 27.28 kg/m   Physical Exam Constitutional:      Appearance: She is well-developed.  Eyes:     Pupils: Pupils are equal, round, and reactive to light.  Pulmonary:     Effort: Pulmonary effort is normal.  Skin:    General: Skin is warm and dry.  Neurological:     Mental Status: She is alert and oriented to person, place, and time.  Psychiatric:        Behavior: Behavior normal.     Ortho Exam awake alert and oriented x3.  Comfortable sitting.  Straight leg raise negative bilaterally.  Good strength in both lower extremities and good pulses.  No percussible pain in the lumbar spine and no pain  with range of motion of either hip Specialty Comments:  No specialty comments available.  Imaging: No results found.   PMFS History: Patient Active Problem List   Diagnosis Date Noted  . Low back pain 10/29/2019  . Pain in left wrist 10/01/2019  . Cervicalgia 07/29/2019  . Trigger finger, left middle finger 04/28/2018  . Hypothyroidism 03/29/2017  . Bilateral carotid artery stenosis 09/25/2016  . Generalized anxiety disorder 06/20/2015  . SUI (stress urinary incontinence, female) 02/05/2014  . 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 Cerebral artery occlusion with cerebral infarction ([facility]) 05/31/2010  . DM (diabetes mellitus), secondary, uncontrolled, with  renal complications ([facility]) 84/13/2440  . Dysuria 01/24/2010  . THYROID NODULE, RIGHT 01/21/2009  . Vitamin D deficiency 12/10/2007  . HYPERLIPIDEMIA 06/17/2007  . Essential hypertension 06/17/2007  . Osteopenia 06/17/2007  . Osteoarthrosis, unspecified whether generalized or localized, unspecified site 11/11/2006   Past Medical History:  Diagnosis Date  . Anxiety   . Arthritis HANDS, NECK , BACK  . Asymptomatic carotid artery stenosis LEFT --  MOD. PER DR WILLIS NOTE OCT 2012  . Benign heart murmur   . Depression    PMH of  . Diabetes mellitus ORAL MED  . DVT (deep venous thrombosis) ([facility]) 2006   after arthroscopy  . Frequency of urination   . History of CVA (cerebrovascular accident) NEUROLOGIST  DR WILLIS----  06-01-2010-  LEFT BASAL GANGLIA HEMORRAGE   RESIDUAL RIGHT HAND NUMBNESS/TINGLING  . Hyperlipidemia   . Hypertension   . Hypothyroid   . MRSA (methicillin resistant staph aureus) culture positive    X3; last post TKR  . Nocturia   . Numbness and tingling in right hand RESIDUAL FROM CVA NOV 2011  . Obesity    Redux therapy  . Osteoporosis   . Thyroid disease    right thyroid nodule-- BX DONE 08-15-2011    Family History  Problem Relation Age of Onset  . Bladder Cancer Mother   . Diabetes Mother   . Brain cancer Sister   . Stroke Paternal Grandfather        in 21s  . Diabetes Sister 3       TYPE 1   . Prostate cancer Brother   . Alzheimer's disease Brother   . Diabetes Maternal Aunt   . Diabetes Maternal Uncle   . Diabetes Maternal Grandmother        Type 2  . Diabetes Maternal Grandfather        Type 2  . Pancreatic cancer Sister   . Kidney failure Sister        in context of DM & influenza  . Heart disease Neg Hx     Past Surgical History:  Procedure Laterality Date  . ABDOMINAL HYSTERECTOMY  1973   Endometriosis & fibroid  . CARPAL TUNNEL RELEASE  2000   RIGHT  . COLONOSCOPY  1999 & 2005   Dr Olevia Perches  . cystoscope     to evaluate recurrent  UTIs  . CYSTOSCOPY W/ RETROGRADES  08/22/2011   Procedure: CYSTOSCOPY WITH RETROGRADE PYELOGRAM;  Surgeon: Molli Hazard, MD;  Location: [facility];  Service: Urology;  Laterality: Bilateral;  . CYSTOSCOPY WITH BIOPSY  08/22/2011   Procedure: CYSTOSCOPY WITH BIOPSY;  Surgeon: Molli Hazard, MD;  Location: [facility];  Service: Urology;  Laterality: N/A;  . g2 p2    . JOINT REPLACEMENT  03-28-2006   LEFT THUMB  . KNEE ARTHROSCOPY     BILATERAL PRIOR  TO TOTAL KNEE  . LEFT SHOULDER SURG  1990'S  . LUMBAR LAMINECTOMY  08-26-2009   L 4 - 5  . THYROID NODULE BX  08-15-2011   RIGHT  . TOTAL KNEE ARTHROPLASTY  05-26-2002      LEFT   AND RIGHT  03-30-2098  . WRIST SURGERY  2014   tendon placed in joint; Dr Burney Gauze   Social History   Occupational History  . Not on file  Tobacco Use  . Smoking status: Never Smoker  . Smokeless tobacco: Never Used  Vaping Use  . Vaping Use: Never used  Substance and Sexual Activity  . Alcohol use: Yes    Comment:  very rarely , < 1 glass wine / month  . Drug use: No  . Sexual activity: Not Currently

## 2020-01-08 ENCOUNTER — Telehealth: Payer: Self-pay | Admitting: Physical Medicine and Rehabilitation

## 2020-01-08 NOTE — Telephone Encounter (Signed)
Patient called.   She wants a call back to get set up for a back injection   Call back: [phone]

## 2020-01-11 NOTE — Telephone Encounter (Signed)
Documented in referral  

## 2020-01-13 ENCOUNTER — Other Ambulatory Visit: Payer: Self-pay

## 2020-01-13 ENCOUNTER — Other Ambulatory Visit (INDEPENDENT_AMBULATORY_CARE_PROVIDER_SITE_OTHER): Payer: Medicare Other

## 2020-01-13 ENCOUNTER — Ambulatory Visit (INDEPENDENT_AMBULATORY_CARE_PROVIDER_SITE_OTHER): Payer: Medicare Other | Admitting: Family

## 2020-01-13 ENCOUNTER — Encounter: Payer: Self-pay | Admitting: Family

## 2020-01-13 VITALS — BP 132/84 | HR 65 | Temp 98.1°F | Wt 170.8 lb

## 2020-01-13 DIAGNOSIS — R1012 Left upper quadrant pain: Secondary | ICD-10-CM

## 2020-01-13 DIAGNOSIS — R1030 Lower abdominal pain, unspecified: Secondary | ICD-10-CM | POA: Diagnosis not present

## 2020-01-13 LAB — URINALYSIS, ROUTINE W REFLEX MICROSCOPIC
Bilirubin Urine: NEGATIVE
Ketones, ur: NEGATIVE
Nitrite: NEGATIVE
Specific Gravity, Urine: >=1.03 — AB (ref 1.000–1.030)
Total Protein, Urine: NEGATIVE
Urine Glucose: >=1000 — AB
Urobilinogen, UA: 0.2 (ref 0.0–1.0)
pH: 5.5 (ref 5.0–8.0)

## 2020-01-13 LAB — CBC WITH DIFFERENTIAL/PLATELET
Basophils Absolute: 0.1 10*3/uL (ref 0.0–0.1)
Basophils Relative: 0.6 % (ref 0.0–3.0)
Eosinophils Absolute: 0.2 10*3/uL (ref 0.0–0.7)
Eosinophils Relative: 2.7 % (ref 0.0–5.0)
HCT: 37.5 % (ref 36.0–46.0)
Hemoglobin: 13.3 g/dL (ref 12.0–15.0)
Lymphocytes Relative: 19.1 % (ref 12.0–46.0)
Lymphs Abs: 1.7 10*3/uL (ref 0.7–4.0)
MCHC: 35.6 g/dL (ref 30.0–36.0)
MCV: 87.2 fl (ref 78.0–100.0)
Monocytes Absolute: 0.5 10*3/uL (ref 0.1–1.0)
Monocytes Relative: 6 % (ref 3.0–12.0)
Neutro Abs: 6.4 10*3/uL (ref 1.4–7.7)
Neutrophils Relative %: 71.6 % (ref 43.0–77.0)
Platelets: 204 10*3/uL (ref 150.0–400.0)
RBC: 4.3 Mil/uL (ref 3.87–5.11)
RDW: 12.9 % (ref 11.5–15.5)
WBC: 8.9 10*3/uL (ref 4.0–10.5)

## 2020-01-13 LAB — COMPREHENSIVE METABOLIC PANEL
ALT: 10 U/L (ref 0–35)
AST: 14 U/L (ref 0–37)
Albumin: 4.3 g/dL (ref 3.5–5.2)
Alkaline Phosphatase: 34 U/L — ABNORMAL LOW (ref 39–117)
BUN: 15 mg/dL (ref 6–23)
CO2: 27 mEq/L (ref 19–32)
Calcium: 9.7 mg/dL (ref 8.4–10.5)
Chloride: 101 mEq/L (ref 96–112)
Creatinine, Ser: 0.7 mg/dL (ref 0.40–1.20)
GFR: 80.23 mL/min (ref >60.00)
Glucose, Bld: 258 mg/dL — ABNORMAL HIGH (ref 70–99)
Potassium: 4 mEq/L (ref 3.5–5.1)
Sodium: 137 mEq/L (ref 135–145)
Total Bilirubin: 0.6 mg/dL (ref 0.2–1.2)
Total Protein: 6.6 g/dL (ref 6.0–8.3)

## 2020-01-13 LAB — AMYLASE: Amylase: 28 U/L (ref 27–131)

## 2020-01-13 LAB — LIPASE: Lipase: 30 U/L (ref 11.0–59.0)

## 2020-01-13 NOTE — Patient Instructions (Signed)
Lipoma  A lipoma is a noncancerous (benign) tumor that is made up of fat cells. This is a very common type of soft-tissue growth. Lipomas are usually found under the skin (subcutaneous). They may occur in any tissue of the body that contains fat. Common areas for lipomas to appear include the back, arms, shoulders, buttocks, and thighs. Lipomas grow slowly, and they are usually painless. Most lipomas do not cause problems and do not require treatment. What are the causes? The cause of this condition is not known. What increases the risk? You are more likely to develop this condition if:  You are 40-60 years old.  You have a family history of lipomas. What are the signs or symptoms? A lipoma usually appears as a small, round bump under the skin. In most cases, the lump will:  Feel soft or rubbery.  Not cause pain or other symptoms. However, if a lipoma is located in an area where it pushes on nerves, it can become painful or cause other symptoms. How is this diagnosed? A lipoma can usually be diagnosed with a physical exam. You may also have tests to confirm the diagnosis and to rule out other conditions. Tests may include:  Imaging tests, such as a CT scan or an MRI.  Removal of a tissue sample to be looked at under a microscope (biopsy). How is this treated? Treatment for this condition depends on the size of the lipoma and whether it is causing any symptoms.  For small lipomas that are not causing problems, no treatment is needed.  If a lipoma is bigger or it causes problems, surgery may be done to remove the lipoma. Lipomas can also be removed to improve appearance. Most often, the procedure is done after applying a medicine that numbs the area (local anesthetic).  Liposuction may be done to reduce the size of the lipoma before it is removed through surgery, or it may be done to remove the lipoma. Lipomas are removed with this method in order to limit incision size and scarring. A  liposuction tube is inserted through a small incision into the lipoma, and the contents of the lipoma are removed through the tube with suction. Follow these instructions at home:  Watch your lipoma for any changes.  Keep all follow-up visits as told by your health care provider. This is important. Contact a health care provider if:  Your lipoma becomes larger or hard.  Your lipoma becomes painful, red, or increasingly swollen. These could be signs of infection or a more serious condition. Get help right away if:  You develop tingling or numbness in an area near the lipoma. This could indicate that the lipoma is causing nerve damage. Summary  A lipoma is a noncancerous tumor that is made up of fat cells.  Most lipomas do not cause problems and do not require treatment.  If a lipoma is bigger or it causes problems, surgery may be done to remove the lipoma.  Contact a health care provider if your lipoma becomes larger or hard, or if it becomes painful, red, or increasingly swollen. Pain, redness, and swelling could be signs of infection or a more serious condition. This information is not intended to replace advice given to you by your health care provider. Make sure you discuss any questions you have with your health care provider. Document Revised: 02/23/2019 Document Reviewed: 02/23/2019 Elsevier Patient Education  2020 [employer]  

## 2020-01-13 NOTE — Progress Notes (Signed)
[name] is a 79 y.o. female with the following history as recorded in EpicCare:  Patient Active Problem List   Diagnosis Date Noted  . Low back pain 10/29/2019  . Pain in left wrist 10/01/2019  . Cervicalgia 07/29/2019  . Trigger finger, left middle finger 04/28/2018  . Hypothyroidism 03/29/2017  . Bilateral carotid artery stenosis 09/25/2016  . Generalized anxiety disorder 06/20/2015  . SUI (stress urinary incontinence, female) 02/05/2014  . 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 Cerebral artery occlusion with cerebral infarction ([place]) 05/31/2010  . DM (diabetes mellitus), secondary, uncontrolled, with renal complications ([place]) 42/04/3127  . Dysuria 01/24/2010  . THYROID NODULE, RIGHT 01/21/2009  . Vitamin D deficiency 12/10/2007  . HYPERLIPIDEMIA 06/17/2007  . Essential hypertension 06/17/2007  . Osteopenia 06/17/2007  . Osteoarthrosis, unspecified whether generalized or localized, unspecified site 11/11/2006    Current Outpatient Medications  Medication Sig Dispense Refill  . aspirin 81 MG tablet Take 81 mg by mouth at bedtime.     . benazepril (LOTENSIN) 20 MG tablet TAKE 1 TABLET DAILY 90 tablet 3  . calcium citrate-vitamin D 500-400 MG-UNIT chewable tablet Chew 1 tablet by mouth at bedtime.     . Cholecalciferol (VITAMIN D3) 1000 UNITS CAPS Take 1 capsule by mouth daily.    . clonazePAM (KLONOPIN) 0.5 MG tablet TAKE 1 TABLET DAILY AS NEEDED FOR ANXIETY 90 tablet 1  . CRANBERRY PO Take 300 mg by mouth daily.    . cycloSPORINE (RESTASIS) 0.05 % ophthalmic emulsion Place 1 drop into both eyes 2 (two) times daily.    . Dulaglutide (TRULICITY) 1.5 FV/8.8QL SOPN Inject 1.5 mg into the skin once a week. 3 pen 3  . escitalopram (LEXAPRO) 10 MG tablet TAKE 1 TABLET DAILY 90 tablet 0  . glucose blood (FREESTYLE LITE) test strip USE TO CHECK BLOOD SUGAR DAILY AS DIRECTED 100 each 2  . JANUVIA 100 MG tablet TAKE 1 TABLET DAILY 90 tablet 3  . Lancets (FREESTYLE) lancets Use as  instructed 100 each 2  . levothyroxine (SYNTHROID) 25 MCG tablet TAKE 1 TABLET DAILY BEFORE BREAKFAST 90 tablet 3  . metFORMIN (GLUCOPHAGE-XR) 500 MG 24 hr tablet TAKE 2 TABLETS (1000 MG) WITH BREAKFAST AND 1 TABLET WITH DINNER 270 tablet 1  . methylcellulose (ARTIFICIAL TEARS) 1 % ophthalmic solution Place 1 drop into both eyes daily as needed (itching eyes).     . Multiple Vitamins-Calcium (ONE-A-DAY WOMENS FORMULA) TABS Take 1 tablet by mouth daily.    . pravastatin (PRAVACHOL) 40 MG tablet TAKE 1 TABLET AT BEDTIME 90 tablet 0   No current facility-administered medications for this visit.    Allergies: Conjugated estrogens, Percocet [oxycodone-acetaminophen], and Detrol [tolterodine]  Past Medical History:  Diagnosis Date  . Anxiety   . Arthritis HANDS, NECK , BACK  . Asymptomatic carotid artery stenosis LEFT --  MOD. PER DR WILLIS NOTE OCT 2012  . Benign heart murmur   . Depression    PMH of  . Diabetes mellitus ORAL MED  . DVT (deep venous thrombosis) ([place]) 2006   after arthroscopy  . Frequency of urination   . History of CVA (cerebrovascular accident) NEUROLOGIST  DR WILLIS----  06-01-2010-  LEFT BASAL GANGLIA HEMORRAGE   RESIDUAL RIGHT HAND NUMBNESS/TINGLING  . Hyperlipidemia   . Hypertension   . Hypothyroid   . MRSA (methicillin resistant staph aureus) culture positive    X3; last post TKR  . Nocturia   . Numbness and tingling in right hand RESIDUAL FROM CVA  NOV 2011  . Obesity    Redux therapy  . Osteoporosis   . Thyroid disease    right thyroid nodule-- BX DONE 08-15-2011    Past Surgical History:  Procedure Laterality Date  . ABDOMINAL HYSTERECTOMY  1973   Endometriosis & fibroid  . CARPAL TUNNEL RELEASE  2000   RIGHT  . COLONOSCOPY  1999 & 2005   Dr Olevia Perches  . cystoscope     to evaluate recurrent UTIs  . CYSTOSCOPY W/ RETROGRADES  08/22/2011   Procedure: CYSTOSCOPY WITH RETROGRADE PYELOGRAM;  Surgeon: Molli Hazard, MD;  Location[facility];  Service: Urology;  Laterality: Bilateral;  . CYSTOSCOPY WITH BIOPSY  08/22/2011   Procedure: CYSTOSCOPY WITH BIOPSY;  Surgeon: Molli Hazard, MD;  Location: [facility];  Service: Urology;  Laterality: N/A;  . g2 p2    . JOINT REPLACEMENT  03-28-2006   LEFT THUMB  . KNEE ARTHROSCOPY     BILATERAL PRIOR TO TOTAL KNEE  . LEFT SHOULDER SURG  1990'S  . LUMBAR LAMINECTOMY  08-26-2009   L 4 - 5  . THYROID NODULE BX  08-15-2011   RIGHT  . TOTAL KNEE ARTHROPLASTY  05-26-2002      LEFT   AND RIGHT  03-30-2098  . WRIST SURGERY  2014   tendon placed in joint; Dr Burney Gauze    Family History  Problem Relation Age of Onset  . Bladder Cancer Mother   . Diabetes Mother   . Brain cancer Sister   . Stroke Paternal Grandfather        in 26s  . Diabetes Sister 25       TYPE 1   . Prostate cancer Brother   . Alzheimer's disease Brother   . Diabetes Maternal Aunt   . Diabetes Maternal Uncle   . Diabetes Maternal Grandmother        Type 2  . Diabetes Maternal Grandfather        Type 2  . Pancreatic cancer Sister   . Kidney failure Sister        in context of DM & influenza  . Heart disease Neg Hx     Social History   Tobacco Use  . Smoking status: Never Smoker  . Smokeless tobacco: Never Used  Substance Use Topics  . Alcohol use: Yes    Comment:  very rarely , < 1 glass wine / month    Subjective:  Complaining of "sore spot" on left side of stomach; notes that pain is intermittent/ symptoms started 6 weeks ago; no nausea, vomiting; no unexplained weight loss, no fever;  "feels like there is a nodule" there; no specific injury; pain has occasionally woken her up from sleep; thinks area may be increasing in size slightly;  No concerns for changes in her blood sugar; Hgba1c in February 2021 was well controlled at 6.3;   Objective:  Vitals:   01/13/20 1053  BP: 132/84  Pulse: 65  Temp: 98.1 F (36.7 C)  TempSrc: Oral  SpO2: 95%  Weight: 170 lb 12.8 oz  (77.5 kg)    General: Well developed, well nourished, in no acute distress  Skin : Warm and dry.  Head: Normocephalic and atraumatic  Eyes: Sclera and conjunctiva clear; pupils round and reactive to light; extraocular movements intact  Ears: External normal; canals clear; tympanic membranes normal  Oropharynx: Pink, supple. No suspicious lesions  Neck: Supple without thyromegaly, adenopathy  Lungs: Respirations unlabored;  Abdomen: Soft; nontender;  nondistended; normoactive bowel sounds; soft movable lesion noted in LUQ area beneath rib cage; Musculoskeletal: No deformities; no active joint inflammation  Extremities: No edema, cyanosis, clubbing  Vessels: Symmetric bilaterally  Neurologic: Alert and oriented; speech intact; face symmetrical; moves all extremities well; CNII-XII intact without focal deficit   Assessment:  1. Lower abdominal pain   2. LUQ abdominal pain     Plan:  ? [place]; will update labs and urine today; will update abd/ pelvic CT; may need to refer to surgeon due to patient having pain with the area; follow-up to be determined;  This visit occurred during the SARS-CoV-2 public health emergency.  Safety protocols were in place, including screening questions prior to the visit, additional usage of staff PPE, and extensive cleaning of exam room while observing appropriate contact time as indicated for disinfecting solutions.     No follow-ups on file.  Orders Placed This Encounter  Procedures  . CT Abdomen Pelvis W Contrast    Standing Status:   Future    Standing Expiration Date:   01/12/2021    Order Specific Question:   If indicated for the ordered procedure, I authorize the administration of contrast media per Radiology protocol    Answer:   Yes    Order Specific Question:   Preferred imaging location?    Answer:   [address]    Order Specific Question:   Is Oral Contrast requested for this exam?    Answer:   Yes, Per Radiology protocol    Order  Specific Question:   Radiology Contrast Protocol - do NOT remove file path    Answer:   [email or web address]  . CBC with Differential/Platelet    Standing Status:   Future    Standing Expiration Date:   01/12/2021  . Comp Met (CMET)    Standing Status:   Future    Standing Expiration Date:   01/12/2021  . Amylase    Standing Status:   Future    Standing Expiration Date:   01/12/2021  . Lipase    Standing Status:   Future    Standing Expiration Date:   01/12/2021  . Urinalysis    Standing Status:   Future    Standing Expiration Date:   01/12/2021    Requested Prescriptions    No prescriptions requested or ordered in this encounter

## 2020-02-02 ENCOUNTER — Ambulatory Visit
Admission: RE | Admit: 2020-02-02 | Discharge: 2020-02-02 | Disposition: A | Discharge status: Home / Self Care | Payer: Medicare Other | Source: Ambulatory Visit | Attending: Family | Admitting: Family

## 2020-02-02 ENCOUNTER — Other Ambulatory Visit: Payer: Self-pay

## 2020-02-02 DIAGNOSIS — R1030 Lower abdominal pain, unspecified: Secondary | ICD-10-CM

## 2020-02-02 DIAGNOSIS — R1031 Right lower quadrant pain: Secondary | ICD-10-CM | POA: Diagnosis not present

## 2020-02-02 DIAGNOSIS — K5641 Fecal impaction: Secondary | ICD-10-CM | POA: Diagnosis not present

## 2020-02-02 DIAGNOSIS — M47817 Spondylosis without myelopathy or radiculopathy, lumbosacral region: Secondary | ICD-10-CM | POA: Diagnosis not present

## 2020-02-02 DIAGNOSIS — Z9049 Acquired absence of other specified parts of digestive tract: Secondary | ICD-10-CM | POA: Diagnosis not present

## 2020-02-02 MED ORDER — IOPAMIDOL (ISOVUE-300) INJECTION 61%
100.0000 mL | Freq: Once | INTRAVENOUS | Status: AC | PRN
  Administered 2020-02-02: 100 mL via INTRAVENOUS

## 2020-02-15 ENCOUNTER — Encounter: Payer: Self-pay | Admitting: Physical Medicine and Rehabilitation

## 2020-02-15 ENCOUNTER — Ambulatory Visit (INDEPENDENT_AMBULATORY_CARE_PROVIDER_SITE_OTHER): Payer: Medicare Other | Admitting: Physical Medicine and Rehabilitation

## 2020-02-15 ENCOUNTER — Ambulatory Visit: Payer: Self-pay

## 2020-02-15 ENCOUNTER — Other Ambulatory Visit: Payer: Self-pay

## 2020-02-15 VITALS — BP 153/78 | HR 75

## 2020-02-15 DIAGNOSIS — M5416 Radiculopathy, lumbar region: Secondary | ICD-10-CM | POA: Diagnosis not present

## 2020-02-15 MED ORDER — METHYLPREDNISOLONE ACETATE 80 MG/ML IJ SUSP
80.0000 mg | Freq: Once | INTRAMUSCULAR | Status: AC
  Administered 2020-02-15: 80 mg

## 2020-02-15 NOTE — Progress Notes (Signed)
Pt states lower back pain. Pt states standing and sitting for a long time makes the pain worse. Pt states laying helps with some of the pain.  Numeric Pain Rating Scale and Functional Assessment Average Pain 5   In the last MONTH (on 0-10 scale) has pain interfered with the following?  1. General activity like being  able to carry out your everyday physical activities such as walking, climbing stairs, carrying groceries, or moving a chair?  Rating(4)   +Driver, -BT, -Dye Allergies.

## 2020-02-21 ENCOUNTER — Other Ambulatory Visit: Payer: Self-pay | Admitting: Internal Medicine

## 2020-02-23 ENCOUNTER — Other Ambulatory Visit: Payer: Self-pay | Admitting: Internal Medicine

## 2020-02-24 ENCOUNTER — Telehealth: Payer: Self-pay | Admitting: Physical Medicine and Rehabilitation

## 2020-02-24 NOTE — Telephone Encounter (Signed)
Patient called requesting to cancel appt. Pt stated she is feeling better and don't need the upcoming appt. Patient phone number is [phone].

## 2020-02-25 NOTE — Telephone Encounter (Signed)
See below. I did cancel the appointment for the patient.

## 2020-02-29 ENCOUNTER — Encounter: Payer: Medicare Other | Admitting: Physical Medicine and Rehabilitation

## 2020-03-08 NOTE — Progress Notes (Signed)
Subjective:    Patient ID: [name], female    DOB: September 06, 1940, 79 y.o.   MRN: [id number]  HPI The patient is here for follow up of their chronic medical problems, including DM, htn, hypothyroidism, hyperlipidemia, anxiety  She is taking all of her medications as prescribed.    She does water aerobics 3 / week. She eating healthy.    Medications and allergies reviewed with patient and updated if appropriate.  Patient Active Problem List   Diagnosis Date Noted  . Cholelithiasis 03/09/2020  . Low back pain 10/29/2019  . Pain in left wrist 10/01/2019  . Cervicalgia 07/29/2019  . Trigger finger, left middle finger 04/28/2018  . Hypothyroidism 03/29/2017  . Bilateral carotid artery stenosis 09/25/2016  . Generalized anxiety disorder 06/20/2015  . SUI (stress urinary incontinence, female) 02/05/2014  . 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 Cerebral artery occlusion with cerebral infarction ([place]) 05/31/2010  . DM (diabetes mellitus), secondary, uncontrolled, with renal complications ([place]) 85/08/7739  . Dysuria 01/24/2010  . THYROID NODULE, RIGHT 01/21/2009  . Vitamin D deficiency 12/10/2007  . HYPERLIPIDEMIA 06/17/2007  . Essential hypertension 06/17/2007  . Osteopenia 06/17/2007  . Osteoarthrosis, unspecified whether generalized or localized, unspecified site 11/11/2006    Current Outpatient Medications on File Prior to Visit  Medication Sig Dispense Refill  . aspirin 81 MG tablet Take 81 mg by mouth at bedtime.     . benazepril (LOTENSIN) 20 MG tablet TAKE 1 TABLET DAILY 90 tablet 3  . calcium citrate-vitamin D 500-400 MG-UNIT chewable tablet Chew 1 tablet by mouth at bedtime.     . Cholecalciferol (VITAMIN D3) 1000 UNITS CAPS Take 1 capsule by mouth daily.    . clonazePAM (KLONOPIN) 0.5 MG tablet TAKE 1 TABLET DAILY AS NEEDED FOR ANXIETY 90 tablet 1  . CRANBERRY PO Take 300 mg by mouth daily.    . cycloSPORINE (RESTASIS) 0.05 % ophthalmic emulsion Place 1 drop into  both eyes 2 (two) times daily.    . Dulaglutide (TRULICITY) 1.5 OI/7.8MV SOPN Inject 1.5 mg into the skin once a week. 3 pen 3  . escitalopram (LEXAPRO) 10 MG tablet TAKE 1 TABLET DAILY 90 tablet 3  . glucose blood (FREESTYLE LITE) test strip USE TO CHECK BLOOD SUGAR DAILY AS DIRECTED 100 each 2  . JANUVIA 100 MG tablet TAKE 1 TABLET DAILY 90 tablet 3  . Lancets (FREESTYLE) lancets Use as instructed 100 each 2  . levothyroxine (SYNTHROID) 25 MCG tablet TAKE 1 TABLET DAILY BEFORE BREAKFAST 90 tablet 3  . metFORMIN (GLUCOPHAGE-XR) 500 MG 24 hr tablet TAKE 2 TABLETS (1000 MG) WITH BREAKFAST AND 1 TABLET WITH DINNER 270 tablet 1  . methylcellulose (ARTIFICIAL TEARS) 1 % ophthalmic solution Place 1 drop into both eyes daily as needed (itching eyes).     . Multiple Vitamins-Calcium (ONE-A-DAY WOMENS FORMULA) TABS Take 1 tablet by mouth daily.    . pravastatin (PRAVACHOL) 40 MG tablet TAKE 1 TABLET AT BEDTIME 90 tablet 3   Current Facility-Administered Medications on File Prior to Visit  Medication Dose Route Frequency Provider Last Rate Last Admin  . methylPREDNISolone acetate (DEPO-MEDROL) injection 80 mg  80 mg Other Once Magnus Sinning, MD        Past Medical History:  Diagnosis Date  . Anxiety   . Arthritis HANDS, NECK , BACK  . Asymptomatic carotid artery stenosis LEFT --  MOD. PER DR WILLIS NOTE OCT 2012  . Benign heart murmur   . Depression  PMH of  . Diabetes mellitus ORAL MED  . DVT (deep venous thrombosis) ([place]) 2006   after arthroscopy  . Frequency of urination   . History of CVA (cerebrovascular accident) NEUROLOGIST  DR WILLIS----  06-01-2010-  LEFT BASAL GANGLIA HEMORRAGE   RESIDUAL RIGHT HAND NUMBNESS/TINGLING  . Hyperlipidemia   . Hypertension   . Hypothyroid   . MRSA (methicillin resistant staph aureus) culture positive    X3; last post TKR  . Nocturia   . Numbness and tingling in right hand RESIDUAL FROM CVA NOV 2011  . Obesity    Redux therapy  . Osteoporosis    . Thyroid disease    right thyroid nodule-- BX DONE 08-15-2011    Past Surgical History:  Procedure Laterality Date  . ABDOMINAL HYSTERECTOMY  1973   Endometriosis & fibroid  . CARPAL TUNNEL RELEASE  2000   RIGHT  . COLONOSCOPY  1999 & 2005   Dr Olevia Perches  . cystoscope     to evaluate recurrent UTIs  . CYSTOSCOPY W/ RETROGRADES  08/22/2011   Procedure: CYSTOSCOPY WITH RETROGRADE PYELOGRAM;  Surgeon: Molli Hazard, MD;  Location: [facility];  Service: Urology;  Laterality: Bilateral;  . CYSTOSCOPY WITH BIOPSY  08/22/2011   Procedure: CYSTOSCOPY WITH BIOPSY;  Surgeon: Molli Hazard, MD;  Location: [facility];  Service: Urology;  Laterality: N/A;  . g2 p2    . JOINT REPLACEMENT  03-28-2006   LEFT THUMB  . KNEE ARTHROSCOPY     BILATERAL PRIOR TO TOTAL KNEE  . LEFT SHOULDER SURG  1990'S  . LUMBAR LAMINECTOMY  08-26-2009   L 4 - 5  . THYROID NODULE BX  08-15-2011   RIGHT  . TOTAL KNEE ARTHROPLASTY  05-26-2002      LEFT   AND RIGHT  03-30-2098  . WRIST SURGERY  2014   tendon placed in joint; Dr Burney Gauze    Social History   Socioeconomic History  . Marital status: Widowed    Spouse name: Not on file  . Number of children: 2  . Years of education: Not on file  . Highest education level: Not on file  Occupational History  . Not on file  Tobacco Use  . Smoking status: Never Smoker  . Smokeless tobacco: Never Used  Vaping Use  . Vaping Use: Never used  Substance and Sexual Activity  . Alcohol use: Yes    Comment:  very rarely , < 1 glass wine / month  . Drug use: No  . Sexual activity: Not Currently  Other Topics Concern  . Not on file  Social History Narrative  . Not on file   Social Determinants of Health   Financial Resource Strain:   . Difficulty of Paying Living Expenses:   Food Insecurity:   . Worried About Charity fundraiser in the Last Year:   . Arboriculturist in the Last Year:   Transportation Needs:   .  Film/video editor (Medical):   Marland Kitchen Lack of Transportation (Non-Medical):   Physical Activity:   . Days of Exercise per Week:   . Minutes of Exercise per Session:   Stress:   . Feeling of Stress :   Social Connections:   . Frequency of Communication with Friends and Family:   . Frequency of Social Gatherings with Friends and Family:   . Attends Religious Services:   . Active Member of Clubs or Organizations:   . Attends Archivist  Meetings:   Marland Kitchen Marital Status:     Family History  Problem Relation Age of Onset  . Bladder Cancer Mother   . Diabetes Mother   . Brain cancer Sister   . Stroke Paternal Grandfather        in 7s  . Diabetes Sister 56       TYPE 1   . Prostate cancer Brother   . Alzheimer's disease Brother   . Diabetes Maternal Aunt   . Diabetes Maternal Uncle   . Diabetes Maternal Grandmother        Type 2  . Diabetes Maternal Grandfather        Type 2  . Pancreatic cancer Sister   . Kidney failure Sister        in context of DM & influenza  . Heart disease Neg Hx     Review of Systems  Constitutional: Negative for chills, diaphoresis and fever.  Respiratory: Negative for cough, shortness of breath and wheezing.   Cardiovascular: Negative for chest pain, palpitations and leg swelling.  Neurological: Positive for headaches (occ, ? from neck). Negative for light-headedness.       Objective:   Vitals:   03/09/20 1401  BP: 130/70  Pulse: 75  Temp: 98 F (36.7 C)  SpO2: 97%   BP Readings from Last 3 Encounters:  03/09/20 130/70  02/15/20 (!) 153/78  01/13/20 132/84   Wt Readings from Last 3 Encounters:  03/09/20 166 lb (75.3 kg)  01/13/20 170 lb 12.8 oz (77.5 kg)  01/05/20 169 lb (76.7 kg)   Body mass index is 26.79 kg/m.   Physical Exam    Constitutional: Appears well-developed and well-nourished. No distress.  HENT:  Head: Normocephalic and atraumatic.  Neck: Neck supple. No tracheal deviation present. No thyromegaly  present.  No cervical lymphadenopathy Cardiovascular: Normal rate, regular rhythm and normal heart sounds.   No murmur heard. No carotid bruit .  No edema Pulmonary/Chest: Effort normal and breath sounds normal. No respiratory distress. No has no wheezes. No rales.  Skin: Skin is warm and dry. Not diaphoretic.  Psychiatric: Normal mood and affect. Behavior is normal.    Diabetic Foot Exam - Simple   Simple Foot Form Diabetic Foot exam was performed with the following findings: Yes 03/09/2020  2:32 PM  Visual Inspection No deformities, no ulcerations, no other skin breakdown bilaterally: Yes Sensation Testing Intact to touch and monofilament testing bilaterally: Yes Pulse Check Posterior Tibialis and Dorsalis pulse intact bilaterally: Yes Comments      Assessment & Plan:    See Problem List for Assessment and Plan of chronic medical problems.    This visit occurred during the SARS-CoV-2 public health emergency.  Safety protocols were in place, including screening questions prior to the visit, additional usage of staff PPE, and extensive cleaning of exam room while observing appropriate contact time as indicated for disinfecting solutions.

## 2020-03-08 NOTE — Patient Instructions (Addendum)
  Blood work was ordered.     Medications reviewed and updated.  Changes include :   none    Please followup in 6 months    [facility] has testing locations in [place], [place] and [employer] [place]. Please use the calendar below to schedule your appointment. For assistance in scheduling a COVID-19 test, please call [phone].  You can also get tested at [facility] or [facility].

## 2020-03-09 ENCOUNTER — Encounter: Payer: Self-pay | Admitting: Internal Medicine

## 2020-03-09 ENCOUNTER — Ambulatory Visit (INDEPENDENT_AMBULATORY_CARE_PROVIDER_SITE_OTHER): Payer: Medicare Other | Admitting: Internal Medicine

## 2020-03-09 ENCOUNTER — Other Ambulatory Visit: Payer: Self-pay

## 2020-03-09 VITALS — BP 130/70 | HR 75 | Temp 98.0°F | Ht 66.0 in | Wt 166.0 lb

## 2020-03-09 DIAGNOSIS — I10 Essential (primary) hypertension: Secondary | ICD-10-CM

## 2020-03-09 DIAGNOSIS — F411 Generalized anxiety disorder: Secondary | ICD-10-CM | POA: Diagnosis not present

## 2020-03-09 DIAGNOSIS — E782 Mixed hyperlipidemia: Secondary | ICD-10-CM

## 2020-03-09 DIAGNOSIS — E1365 Other specified diabetes mellitus with hyperglycemia: Secondary | ICD-10-CM | POA: Diagnosis not present

## 2020-03-09 DIAGNOSIS — E1329 Other specified diabetes mellitus with other diabetic kidney complication: Secondary | ICD-10-CM

## 2020-03-09 DIAGNOSIS — E039 Hypothyroidism, unspecified: Secondary | ICD-10-CM | POA: Diagnosis not present

## 2020-03-09 DIAGNOSIS — K8020 Calculus of gallbladder without cholecystitis without obstruction: Secondary | ICD-10-CM

## 2020-03-09 DIAGNOSIS — IMO0002 Reserved for concepts with insufficient information to code with codable children: Secondary | ICD-10-CM

## 2020-03-09 MED ORDER — BLOOD GLUCOSE MONITOR KIT: PACK | 0 refills | Status: DC

## 2020-03-09 NOTE — Assessment & Plan Note (Signed)
Chronic Controlled, stable Continue current dose of medication Lexapro 10 mg daily Clonazepam daily as needed

## 2020-03-09 NOTE — Assessment & Plan Note (Signed)
Chronic  Clinically euthyroid Check tsh  Titrate med dose if needed  

## 2020-03-09 NOTE — Assessment & Plan Note (Signed)
Seen on imaging and this is not new Currently asymptomatic Will monitor

## 2020-03-09 NOTE — Assessment & Plan Note (Signed)
Chronic BP well controlled Current regimen effective and well tolerated Continue current medications at current doses cmp  

## 2020-03-09 NOTE — Assessment & Plan Note (Signed)
Chronic Check A1c Continue current medications-we will adjust if needed Continue regular exercise and healthy eating

## 2020-03-09 NOTE — Assessment & Plan Note (Signed)
Chronic Check lipid panel  Continue daily statin Regular exercise and healthy diet encouraged  

## 2020-03-10 ENCOUNTER — Other Ambulatory Visit: Payer: Self-pay | Admitting: Internal Medicine

## 2020-03-10 LAB — TSH: TSH: 4.97 mIU/L — ABNORMAL HIGH (ref 0.40–4.50)

## 2020-03-10 LAB — LIPID PANEL
Cholesterol: 149 mg/dL (ref <200)
HDL: 45 mg/dL — ABNORMAL LOW (ref >=50)
LDL Cholesterol (Calc): 75 mg/dL (calc)
Non-HDL Cholesterol (Calc): 104 mg/dL (calc) (ref <130)
Total CHOL/HDL Ratio: 3.3 (calc) (ref <5.0)
Triglycerides: 191 mg/dL — ABNORMAL HIGH (ref <150)

## 2020-03-10 LAB — COMPLETE METABOLIC PANEL WITH GFR
AG Ratio: 2.1 (calc) (ref 1.0–2.5)
ALT: 12 U/L (ref 6–29)
AST: 16 U/L (ref 10–35)
Albumin: 4.5 g/dL (ref 3.6–5.1)
Alkaline phosphatase (APISO): 35 U/L — ABNORMAL LOW (ref 37–153)
BUN: 14 mg/dL (ref 7–25)
CO2: 28 mmol/L (ref 20–32)
Calcium: 9.9 mg/dL (ref 8.6–10.4)
Chloride: 102 mmol/L (ref 98–110)
Creat: 0.65 mg/dL (ref 0.60–0.88)
GFR, Est African American: 97 mL/min/{1.73_m2} (ref >=60)
GFR, Est Non African American: 83 mL/min/{1.73_m2} (ref >=60)
Globulin: 2.1 g/dL (calc) (ref 1.9–3.7)
Glucose, Bld: 110 mg/dL — ABNORMAL HIGH (ref 65–99)
Potassium: 4.6 mmol/L (ref 3.5–5.3)
Sodium: 138 mmol/L (ref 135–146)
Total Bilirubin: 0.6 mg/dL (ref 0.2–1.2)
Total Protein: 6.6 g/dL (ref 6.1–8.1)

## 2020-03-10 LAB — HEMOGLOBIN A1C
Hgb A1c MFr Bld: 7.1 % of total Hgb — ABNORMAL HIGH (ref <5.7)
Mean Plasma Glucose: 157 (calc)
eAG (mmol/L): 8.7 (calc)

## 2020-03-10 MED ORDER — LEVOTHYROXINE SODIUM 50 MCG PO TABS: 50.0000 ug | ORAL_TABLET | Freq: Every day | ORAL | 3 refills | Status: DC

## 2020-03-11 ENCOUNTER — Other Ambulatory Visit: Payer: Self-pay | Admitting: Internal Medicine

## 2020-03-11 DIAGNOSIS — E119 Type 2 diabetes mellitus without complications: Secondary | ICD-10-CM | POA: Diagnosis not present

## 2020-03-11 LAB — HM DIABETES EYE EXAM

## 2020-03-14 ENCOUNTER — Other Ambulatory Visit: Payer: Self-pay | Admitting: Internal Medicine

## 2020-03-14 MED ORDER — LEVOTHYROXINE SODIUM 50 MCG PO TABS: 50.0000 ug | ORAL_TABLET | Freq: Every day | ORAL | 3 refills | Status: DC

## 2020-03-15 DIAGNOSIS — H35371 Puckering of macula, right eye: Secondary | ICD-10-CM | POA: Diagnosis not present

## 2020-03-15 DIAGNOSIS — H35341 Macular cyst, hole, or pseudohole, right eye: Secondary | ICD-10-CM | POA: Diagnosis not present

## 2020-03-15 DIAGNOSIS — H43811 Vitreous degeneration, right eye: Secondary | ICD-10-CM | POA: Diagnosis not present

## 2020-03-16 DIAGNOSIS — Z1231 Encounter for screening mammogram for malignant neoplasm of breast: Secondary | ICD-10-CM | POA: Diagnosis not present

## 2020-03-16 NOTE — Procedures (Signed)
Lumbar Epidural Steroid Injection - Interlaminar Approach with Fluoroscopic Guidance  Patient: [name]      Date of Birth: Sep 25, 1940 MRN: [id number] PCP: Binnie Rail, MD      Visit Date: 02/15/2020   Universal Protocol:     Consent Given By: the patient  Position: PRONE  Additional Comments: Vital signs were monitored before and after the procedure. Patient was prepped and draped in the usual sterile fashion. The correct patient, procedure, and site was verified.   Injection Procedure Details:  Procedure Site One Meds Administered:  Meds ordered this encounter  Medications  . methylPREDNISolone acetate (DEPO-MEDROL) injection 80 mg     Laterality: Left  Location/Site:  L4-L5  Needle size: 20 G  Needle type: Tuohy  Needle Placement: Paramedian epidural  Findings:   -Comments: Excellent flow of contrast into the epidural space.  Procedure Details: Using a paramedian approach from the side mentioned above, the region overlying the inferior lamina was localized under fluoroscopic visualization and the soft tissues overlying this structure were infiltrated with 4 ml. of 1% Lidocaine without Epinephrine. The Tuohy needle was inserted into the epidural space using a paramedian approach.   The epidural space was localized using loss of resistance along with lateral and bi-planar fluoroscopic views.  After negative aspirate for air, blood, and CSF, a 2 ml. volume of Isovue-250 was injected into the epidural space and the flow of contrast was observed. Radiographs were obtained for documentation purposes.    The injectate was administered into the level noted above.   Additional Comments:  No complications occurred Dressing: 2 x 2 sterile gauze and Band-Aid    Post-procedure details: Patient was observed during the procedure. Post-procedure instructions were reviewed.  Patient left the clinic in stable condition.

## 2020-03-16 NOTE — Progress Notes (Signed)
[name] - 79 y.o. female MRN [id number]  Date of birth: 21-Apr-1941  Office Visit Note: Visit Date: 02/15/2020 PCP: Binnie Rail, MD Referred by: Binnie Rail, MD  Subjective: Chief Complaint  Patient presents with  . Lower Back - Pain   HPI:  [name] is a 79 y.o. female who comes in today at the request of Dr. Joni Fears for planned Left L4-L5 Lumbar epidural steroid injection with fluoroscopic guidance.  The patient has failed conservative care including home exercise, medications, time and activity modification.  This injection will be diagnostic and hopefully therapeutic.  Please see requesting physician notes for further details and justification.  MRI reviewed with images and spine model.  MRI reviewed in the note below.   ROS Otherwise per HPI.  Assessment & Plan: Visit Diagnoses:  1. Lumbar radiculopathy     Plan: No additional findings.   Meds & Orders:  Meds ordered this encounter  Medications  . methylPREDNISolone acetate (DEPO-MEDROL) injection 80 mg    Orders Placed This Encounter  Procedures  . XR C-ARM NO REPORT  . Epidural Steroid injection    Follow-up: Return for visit to requesting physician as needed.   Procedures: No procedures performed  Lumbar Epidural Steroid Injection - Interlaminar Approach with Fluoroscopic Guidance  Patient: [name]      Date of Birth: 1941-03-12 MRN: [id number] PCP: Binnie Rail, MD      Visit Date: 02/15/2020   Universal Protocol:     Consent Given By: the patient  Position: PRONE  Additional Comments: Vital signs were monitored before and after the procedure. Patient was prepped and draped in the usual sterile fashion. The correct patient, procedure, and site was verified.   Injection Procedure Details:  Procedure Site One Meds Administered:  Meds ordered this encounter  Medications  . methylPREDNISolone acetate (DEPO-MEDROL) injection 80 mg     Laterality:  Left  Location/Site:  L4-L5  Needle size: 20 G  Needle type: Tuohy  Needle Placement: Paramedian epidural  Findings:   -Comments: Excellent flow of contrast into the epidural space.  Procedure Details: Using a paramedian approach from the side mentioned above, the region overlying the inferior lamina was localized under fluoroscopic visualization and the soft tissues overlying this structure were infiltrated with 4 ml. of 1% Lidocaine without Epinephrine. The Tuohy needle was inserted into the epidural space using a paramedian approach.   The epidural space was localized using loss of resistance along with lateral and bi-planar fluoroscopic views.  After negative aspirate for air, blood, and CSF, a 2 ml. volume of Isovue-250 was injected into the epidural space and the flow of contrast was observed. Radiographs were obtained for documentation purposes.    The injectate was administered into the level noted above.   Additional Comments:  No complications occurred Dressing: 2 x 2 sterile gauze and Band-Aid    Post-procedure details: Patient was observed during the procedure. Post-procedure instructions were reviewed.  Patient left the clinic in stable condition.    Clinical History: CLINICAL DATA:  Chronic low back pain radiating to the left hip.  EXAM: MRI LUMBAR SPINE WITHOUT CONTRAST  TECHNIQUE: Multiplanar, multisequence MR imaging of the lumbar spine was performed. No intravenous contrast was administered.  COMPARISON:  None.  FINDINGS: Segmentation:  Standard.  Alignment: Mild grade 1 anterolisthesis of L4 on L5 secondary to facet disease.  Vertebrae:  No fracture, evidence of discitis, or bone lesion.  Conus medullaris and cauda equina:  Conus extends to the T12-L1 level. Conus and cauda equina appear normal.  Paraspinal and other soft tissues: No acute paraspinal abnormality.  Disc levels:  Disc spaces: Degenerative disease with disc height  loss at L1-2, L4-5 and L5-S1. Disc desiccation with minimal disc height loss at L2-3. Disc desiccation at L3-4.  T12-L1: Mild broad-based disc bulge. No evidence of neural foraminal stenosis. No central canal stenosis.  L1-L2: Broad-based disc bulge flattening the ventral thecal sac. Mild bilateral facet arthropathy. Mild-moderate spinal stenosis. Mild left foraminal narrowing. No right foraminal narrowing.  L2-L3: Broad-based disc bulge flattening the ventral thecal sac. Mild bilateral facet arthropathy. Severe spinal stenosis. Mild left foraminal stenosis. No right foraminal stenosis.  L3-L4: Mild broad-based disc bulge. Moderate bilateral facet arthropathy. Mild-moderate spinal stenosis. No evidence of neural foraminal stenosis.  L4-L5: Broad-based disc osteophyte complex. Moderate bilateral facet arthropathy. Moderate right foraminal stenosis. Mild left foraminal stenosis. Bilateral subarticular recess stenosis. Mild-moderate spinal stenosis.  L5-S1: Broad-based disc bulge. Moderate right and severe left facet arthropathy. Bilateral subarticular recess narrowing. Mild right and moderate left foraminal stenosis.  IMPRESSION: 1. Diffuse lumbar spine spondylosis as described above. 2.  No acute osseous injury of the lumbar spine.   Electronically Signed   By: Kathreen Devoid   On: 12/31/2019 07:17     Objective:  VS:  HT:    WT:   BMI:     BP:(!) 153/78  HR:75bpm  TEMP: ( )  RESP:  Physical Exam Constitutional:      General: She is not in acute distress.    Appearance: Normal appearance. She is not ill-appearing.  HENT:     Head: Normocephalic and atraumatic.     Right Ear: External ear normal.     Left Ear: External ear normal.  Eyes:     Extraocular Movements: Extraocular movements intact.  Cardiovascular:     Rate and Rhythm: Normal rate.     Pulses: Normal pulses.  Musculoskeletal:     Right lower leg: No edema.     Left lower leg: No edema.      Comments: Patient has good distal strength with no pain over the greater trochanters.  No clonus or focal weakness.  Skin:    Findings: No erythema, lesion or rash.  Neurological:     General: No focal deficit present.     Mental Status: She is alert and oriented to person, place, and time.     Sensory: No sensory deficit.     Motor: No weakness or abnormal muscle tone.     Coordination: Coordination normal.  Psychiatric:        Mood and Affect: Mood normal.        Behavior: Behavior normal.      Imaging: No results found.

## 2020-03-30 ENCOUNTER — Telehealth: Payer: Self-pay | Admitting: Orthopaedic Surgery

## 2020-03-30 NOTE — Telephone Encounter (Signed)
Ic, lmvm advised copy of records ready to be picked up.

## 2020-03-31 ENCOUNTER — Encounter: Payer: Self-pay | Admitting: Internal Medicine

## 2020-04-06 DIAGNOSIS — N3000 Acute cystitis without hematuria: Secondary | ICD-10-CM | POA: Diagnosis not present

## 2020-04-26 DIAGNOSIS — Z23 Encounter for immunization: Secondary | ICD-10-CM | POA: Diagnosis not present

## 2020-05-03 ENCOUNTER — Other Ambulatory Visit: Payer: Self-pay | Admitting: Internal Medicine

## 2020-05-27 ENCOUNTER — Other Ambulatory Visit: Payer: Self-pay | Admitting: Internal Medicine

## 2020-06-03 DIAGNOSIS — N3020 Other chronic cystitis without hematuria: Secondary | ICD-10-CM | POA: Diagnosis not present

## 2020-06-03 DIAGNOSIS — N3000 Acute cystitis without hematuria: Secondary | ICD-10-CM | POA: Diagnosis not present

## 2020-07-19 ENCOUNTER — Telehealth: Payer: Self-pay | Admitting: Internal Medicine

## 2020-07-19 NOTE — Telephone Encounter (Signed)
1.Medication Requested:clonazePAM (KLONOPIN) 0.5 MG tablet    2. Pharmacy (Name, Street, [place]): [facility] - [place], [place] - [address]  3. On Med List: yes   4. Last Visit with PCP: 8.18.21  5. Next visit date with PCP:2.24.22   Agent: Please be advised that RX refills may take up to 3 business days. We ask that you follow-up with your pharmacy.

## 2020-07-20 ENCOUNTER — Other Ambulatory Visit: Payer: Self-pay | Admitting: Internal Medicine

## 2020-07-20 DIAGNOSIS — F411 Generalized anxiety disorder: Secondary | ICD-10-CM

## 2020-07-20 MED ORDER — CLONAZEPAM 0.5 MG PO TABS: ORAL_TABLET | ORAL | 1 refills | Status: DC

## 2020-07-28 ENCOUNTER — Telehealth: Payer: Self-pay | Admitting: Internal Medicine

## 2020-07-28 ENCOUNTER — Other Ambulatory Visit: Payer: Self-pay

## 2020-07-28 MED ORDER — DIAZEPAM 5 MG PO TABS: 5.0000 mg | ORAL_TABLET | Freq: Two times a day (BID) | ORAL | 0 refills | Status: DC | PRN

## 2020-07-28 MED ORDER — DIAZEPAM 5 MG PO TABS
5.0000 mg | ORAL_TABLET | Freq: Two times a day (BID) | ORAL | 1 refills | Status: DC | PRN
Start: 2020-07-28 — End: 2020-07-28

## 2020-07-28 MED ORDER — METFORMIN HCL ER 500 MG PO TB24: ORAL_TABLET | ORAL | 1 refills | Status: DC

## 2020-07-28 NOTE — Telephone Encounter (Signed)
[facility]

## 2020-07-28 NOTE — Telephone Encounter (Signed)
° ° °  Patient calling to report clonazePAM (KLONOPIN) 0.5 MG tablet no longer covered by  Her insurance  Patient refill for metFORMIN (GLUCOPHAGE-XR) 500 MG 24 hr tablet to [facility] - [place], [place] - [address]

## 2020-07-28 NOTE — Telephone Encounter (Signed)
We can change clonazepam to valium which is similar and should be covered.  I sent this to her pharmacy

## 2020-08-23 ENCOUNTER — Other Ambulatory Visit: Payer: Self-pay | Admitting: Internal Medicine

## 2020-09-01 DIAGNOSIS — R311 Benign essential microscopic hematuria: Secondary | ICD-10-CM | POA: Diagnosis not present

## 2020-09-01 DIAGNOSIS — N3020 Other chronic cystitis without hematuria: Secondary | ICD-10-CM | POA: Diagnosis not present

## 2020-09-13 DIAGNOSIS — H35373 Puckering of macula, bilateral: Secondary | ICD-10-CM | POA: Diagnosis not present

## 2020-09-13 DIAGNOSIS — H43813 Vitreous degeneration, bilateral: Secondary | ICD-10-CM | POA: Diagnosis not present

## 2020-09-13 DIAGNOSIS — H35341 Macular cyst, hole, or pseudohole, right eye: Secondary | ICD-10-CM | POA: Diagnosis not present

## 2020-09-14 ENCOUNTER — Encounter: Payer: Self-pay | Admitting: Internal Medicine

## 2020-09-14 NOTE — Progress Notes (Signed)
Subjective:    Patient ID: [name], female    DOB: 1941-06-22, 80 y.o.   MRN: [id number]  HPI The patient is here for follow up of their chronic medical problems, including DM, htn, hypothyroidism, hyperlipidemia, anxiety  She does water aerobics 3/week.  He is eating pretty well.     Medications and allergies reviewed with patient and updated if appropriate.  Patient Active Problem List   Diagnosis Date Noted  . Cholelithiasis 03/09/2020  . Low back pain 10/29/2019  . Pain in left wrist 10/01/2019  . Cervicalgia 07/29/2019  . Trigger finger, left middle finger 04/28/2018  . Hypothyroidism 03/29/2017  . Bilateral carotid artery stenosis 09/25/2016  . Generalized anxiety disorder 06/20/2015  . SUI (stress urinary incontinence, female) 02/05/2014  . 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 Cerebral artery occlusion with cerebral infarction ([place]) 05/31/2010  . DM (diabetes mellitus), secondary, uncontrolled, with renal complications ([place]) 31/59/4585  . THYROID NODULE, RIGHT 01/21/2009  . Vitamin D deficiency 12/10/2007  . HYPERLIPIDEMIA 06/17/2007  . Essential hypertension 06/17/2007  . Osteopenia 06/17/2007  . Osteoarthrosis, unspecified whether generalized or localized, unspecified site 11/11/2006    Current Outpatient Medications on File Prior to Visit  Medication Sig Dispense Refill  . aspirin 81 MG tablet Take 81 mg by mouth at bedtime.     . benazepril (LOTENSIN) 20 MG tablet TAKE 1 TABLET DAILY 90 tablet 3  . blood glucose meter kit and supplies KIT Dispense based on patient and insurance preference. Use up to four times daily as directed. (FOR E11.9). 1 each 0  . calcium citrate-vitamin D 500-400 MG-UNIT chewable tablet Chew 1 tablet by mouth at bedtime.     . Cholecalciferol (VITAMIN D3) 1000 UNITS CAPS Take 1 capsule by mouth daily.    Marland Kitchen CRANBERRY PO Take 300 mg by mouth daily.    . cycloSPORINE (RESTASIS) 0.05 % ophthalmic emulsion Place 1 drop into both  eyes 2 (two) times daily.    . diazepam (VALIUM) 5 MG tablet Take 1 tablet (5 mg total) by mouth every 12 (twelve) hours as needed for anxiety. 180 tablet 0  . escitalopram (LEXAPRO) 10 MG tablet TAKE 1 TABLET DAILY 90 tablet 3  . estradiol (ESTRACE) 0.1 MG/GM vaginal cream [id number] Applicator Vaginal 3 Times a Week    . JANUVIA 100 MG tablet TAKE 1 TABLET DAILY 90 tablet 3  . levothyroxine (SYNTHROID) 50 MCG tablet Take 1 tablet (50 mcg total) by mouth daily. 90 tablet 3  . metFORMIN (GLUCOPHAGE-XR) 500 MG 24 hr tablet TAKE 2 TABLETS (1000 MG) WITH BREAKFAST AND 1 TABLET WITH DINNER 270 tablet 1  . methenamine (HIPREX) 1 g tablet Take 1 g by mouth 2 (two) times daily.    . methylcellulose (ARTIFICIAL TEARS) 1 % ophthalmic solution Place 1 drop into both eyes daily as needed (itching eyes).     . Multiple Vitamins-Calcium (ONE-A-DAY WOMENS FORMULA) TABS Take 1 tablet by mouth daily.    . pravastatin (PRAVACHOL) 40 MG tablet TAKE 1 TABLET AT BEDTIME 90 tablet 3  . TRULICITY 1.5 NO/1.7RN SOPN INJECT 1.5 MG INTO THE SKIN ONCE A WEEK 6 mL 3   No current facility-administered medications on file prior to visit.    Past Medical History:  Diagnosis Date  . Anxiety   . Arthritis HANDS, NECK , BACK  . Asymptomatic carotid artery stenosis LEFT --  MOD. PER DR WILLIS NOTE OCT 2012  . Benign heart murmur   . Depression  PMH of  . Diabetes mellitus ORAL MED  . DVT (deep venous thrombosis) ([place]) 2006   after arthroscopy  . Frequency of urination   . History of CVA (cerebrovascular accident) NEUROLOGIST  DR WILLIS----  06-01-2010-  LEFT BASAL GANGLIA HEMORRAGE   RESIDUAL RIGHT HAND NUMBNESS/TINGLING  . Hyperlipidemia   . Hypertension   . Hypothyroid   . MRSA (methicillin resistant staph aureus) culture positive    X3; last post TKR  . Nocturia   . Numbness and tingling in right hand RESIDUAL FROM CVA NOV 2011  . Obesity    Redux therapy  . Osteoporosis   . Thyroid disease    right  thyroid nodule-- BX DONE 08-15-2011    Past Surgical History:  Procedure Laterality Date  . ABDOMINAL HYSTERECTOMY  1973   Endometriosis & fibroid  . CARPAL TUNNEL RELEASE  2000   RIGHT  . COLONOSCOPY  1999 & 2005   Dr Olevia Perches  . cystoscope     to evaluate recurrent UTIs  . CYSTOSCOPY W/ RETROGRADES  08/22/2011   Procedure: CYSTOSCOPY WITH RETROGRADE PYELOGRAM;  Surgeon: Molli Hazard, MD;  Location: [facility];  Service: Urology;  Laterality: Bilateral;  . CYSTOSCOPY WITH BIOPSY  08/22/2011   Procedure: CYSTOSCOPY WITH BIOPSY;  Surgeon: Molli Hazard, MD;  Location: [facility];  Service: Urology;  Laterality: N/A;  . g2 p2    . JOINT REPLACEMENT  03-28-2006   LEFT THUMB  . KNEE ARTHROSCOPY     BILATERAL PRIOR TO TOTAL KNEE  . LEFT SHOULDER SURG  1990'S  . LUMBAR LAMINECTOMY  08-26-2009   L 4 - 5  . THYROID NODULE BX  08-15-2011   RIGHT  . TOTAL KNEE ARTHROPLASTY  05-26-2002      LEFT   AND RIGHT  03-30-2098  . WRIST SURGERY  2014   tendon placed in joint; Dr Burney Gauze    Social History   Socioeconomic History  . Marital status: Widowed    Spouse name: Not on file  . Number of children: 2  . Years of education: Not on file  . Highest education level: Not on file  Occupational History  . Not on file  Tobacco Use  . Smoking status: Never Smoker  . Smokeless tobacco: Never Used  Vaping Use  . Vaping Use: Never used  Substance and Sexual Activity  . Alcohol use: Yes    Comment:  very rarely , < 1 glass wine / month  . Drug use: No  . Sexual activity: Not Currently  Other Topics Concern  . Not on file  Social History Narrative  . Not on file   Social Determinants of Health   Financial Resource Strain: Not on file  Food Insecurity: Not on file  Transportation Needs: Not on file  Physical Activity: Not on file  Stress: Not on file  Social Connections: Not on file    Family History  Problem Relation Age of Onset   . Bladder Cancer Mother   . Diabetes Mother   . Brain cancer Sister   . Stroke Paternal Grandfather        in 90s  . Diabetes Sister 62       TYPE 1   . Prostate cancer Brother   . Alzheimer's disease Brother   . Diabetes Maternal Aunt   . Diabetes Maternal Uncle   . Diabetes Maternal Grandmother        Type 2  . Diabetes Maternal Grandfather  Type 2  . Pancreatic cancer Sister   . Kidney failure Sister        in context of DM & influenza  . Heart disease Neg Hx     Review of Systems  Constitutional: Negative for chills.  Respiratory: Negative for cough, shortness of breath and wheezing.   Cardiovascular: Negative for chest pain, palpitations and leg swelling.  Musculoskeletal: Positive for neck pain.  Neurological: Positive for dizziness (occ) and headaches (posterior - from neck). Negative for light-headedness.       Objective:   Vitals:   09/15/20 1124  BP: 130/72  Pulse: 64  Temp: 98.1 F (36.7 C)  SpO2: 99%   BP Readings from Last 3 Encounters:  09/15/20 130/72  03/09/20 130/70  02/15/20 (!) 153/78   Wt Readings from Last 3 Encounters:  09/15/20 167 lb (75.8 kg)  03/09/20 166 lb (75.3 kg)  01/13/20 170 lb 12.8 oz (77.5 kg)   Body mass index is 26.95 kg/m.   Physical Exam    Constitutional: Appears well-developed and well-nourished. No distress.  HENT:  Head: Normocephalic and atraumatic.  Neck: Neck supple. No tracheal deviation present. No thyromegaly present.  No cervical lymphadenopathy Cardiovascular: Normal rate, regular rhythm and normal heart sounds.   No murmur heard. No carotid bruit .  No edema Pulmonary/Chest: Effort normal and breath sounds normal. No respiratory distress. No has no wheezes. No rales.  Skin: Skin is warm and dry. Not diaphoretic.  Psychiatric: Normal mood and affect. Behavior is normal.      Assessment & Plan:    See Problem List for Assessment and Plan of chronic medical problems.    This visit  occurred during the SARS-CoV-2 public health emergency.  Safety protocols were in place, including screening questions prior to the visit, additional usage of staff PPE, and extensive cleaning of exam room while observing appropriate contact time as indicated for disinfecting solutions.

## 2020-09-14 NOTE — Patient Instructions (Addendum)
  Blood work was ordered.     Medications changes include :   Increase trulicty to 3 mg weekly, stop [place] when you do that  Your prescription(s) have been submitted to your pharmacy. Please take as directed and contact our office if you believe you are having problem(s) with the medication(s).   A carotid ultrasound was ordered - they will call you to schedule this   Please followup in 6 months

## 2020-09-15 ENCOUNTER — Ambulatory Visit (INDEPENDENT_AMBULATORY_CARE_PROVIDER_SITE_OTHER): Payer: Medicare Other | Admitting: Internal Medicine

## 2020-09-15 ENCOUNTER — Other Ambulatory Visit: Payer: Self-pay

## 2020-09-15 VITALS — BP 130/72 | HR 64 | Temp 98.1°F | Ht 66.0 in | Wt 167.0 lb

## 2020-09-15 DIAGNOSIS — I6523 Occlusion and stenosis of bilateral carotid arteries: Secondary | ICD-10-CM

## 2020-09-15 DIAGNOSIS — IMO0002 Reserved for concepts with insufficient information to code with codable children: Secondary | ICD-10-CM

## 2020-09-15 DIAGNOSIS — E1365 Other specified diabetes mellitus with hyperglycemia: Secondary | ICD-10-CM | POA: Diagnosis not present

## 2020-09-15 DIAGNOSIS — E782 Mixed hyperlipidemia: Secondary | ICD-10-CM

## 2020-09-15 DIAGNOSIS — E039 Hypothyroidism, unspecified: Secondary | ICD-10-CM

## 2020-09-15 DIAGNOSIS — I10 Essential (primary) hypertension: Secondary | ICD-10-CM

## 2020-09-15 DIAGNOSIS — F411 Generalized anxiety disorder: Secondary | ICD-10-CM

## 2020-09-15 DIAGNOSIS — M542 Cervicalgia: Secondary | ICD-10-CM | POA: Diagnosis not present

## 2020-09-15 DIAGNOSIS — E1329 Other specified diabetes mellitus with other diabetic kidney complication: Secondary | ICD-10-CM | POA: Diagnosis not present

## 2020-09-15 LAB — LIPID PANEL
Cholesterol: 123 mg/dL (ref 0–200)
HDL: 51 mg/dL (ref >39.00)
LDL Cholesterol: 53 mg/dL (ref 0–99)
NonHDL: 72.36
Total CHOL/HDL Ratio: 2
Triglycerides: 95 mg/dL (ref 0.0–149.0)
VLDL: 19 mg/dL (ref 0.0–40.0)

## 2020-09-15 LAB — COMPREHENSIVE METABOLIC PANEL
ALT: 12 U/L (ref 0–35)
AST: 17 U/L (ref 0–37)
Albumin: 4.1 g/dL (ref 3.5–5.2)
Alkaline Phosphatase: 33 U/L — ABNORMAL LOW (ref 39–117)
BUN: 19 mg/dL (ref 6–23)
CO2: 30 mEq/L (ref 19–32)
Calcium: 9.9 mg/dL (ref 8.4–10.5)
Chloride: 102 mEq/L (ref 96–112)
Creatinine, Ser: 0.73 mg/dL (ref 0.40–1.20)
GFR: 76.76 mL/min (ref >60.00)
Glucose, Bld: 161 mg/dL — ABNORMAL HIGH (ref 70–99)
Potassium: 4.4 mEq/L (ref 3.5–5.1)
Sodium: 140 mEq/L (ref 135–145)
Total Bilirubin: 0.5 mg/dL (ref 0.2–1.2)
Total Protein: 6.4 g/dL (ref 6.0–8.3)

## 2020-09-15 LAB — TSH: TSH: 2.1 u[IU]/mL (ref 0.35–4.50)

## 2020-09-15 LAB — HEMOGLOBIN A1C: Hgb A1c MFr Bld: 6.2 % (ref 4.6–6.5)

## 2020-09-15 MED ORDER — TRULICITY 3 MG/0.5ML ~~LOC~~ SOAJ
3.0000 mg | SUBCUTANEOUS | 5 refills | Status: DC
Start: 2020-09-15 — End: 2020-11-18

## 2020-09-15 NOTE — Assessment & Plan Note (Signed)
Chronic posterior neck pain, causes headaches Has seen ortho Pain tolerable with taking excedrin daily

## 2020-09-15 NOTE — Assessment & Plan Note (Signed)
Chronic Last a1c 7.1% a1c today Increase trulicity to 3 mg weekly Stop januvia Continue metformin 1000 mg , 500 mg in evening

## 2020-09-15 NOTE — Assessment & Plan Note (Signed)
Chronic BP well controlled Continue benazepril 20 mg daily cmp  

## 2020-09-15 NOTE — Assessment & Plan Note (Signed)
Chronic Check lipid panel  Continue pravastatin 40 mg daily Regular exercise and healthy diet encouraged  

## 2020-09-15 NOTE — Assessment & Plan Note (Signed)
Chronic  Clinically euthyroid Currently taking levothyroxine 50 mcg Check tsh  Titrate med dose if needed  

## 2020-09-15 NOTE — Assessment & Plan Note (Signed)
Chronic Controlled, stable Continue Lexapro 10 mg daily, Valium 5 mg every 12 hours as needed-typically only takes Valium once a day

## 2020-09-15 NOTE — Assessment & Plan Note (Signed)
Chronic [place] have varied asymptomatic Will recheck carotid US Continue pravastatin 40mg  daily

## 2020-09-20 ENCOUNTER — Ambulatory Visit (HOSPITAL_COMMUNITY)
Admission: RE | Admit: 2020-09-20 | Discharge: 2020-09-20 | Disposition: A | Discharge status: Home / Self Care | Payer: Medicare Other | Source: Ambulatory Visit | Attending: Cardiology | Admitting: Cardiology

## 2020-09-20 ENCOUNTER — Other Ambulatory Visit: Payer: Self-pay

## 2020-09-20 DIAGNOSIS — I6523 Occlusion and stenosis of bilateral carotid arteries: Secondary | ICD-10-CM | POA: Diagnosis not present

## 2020-09-29 ENCOUNTER — Telehealth (INDEPENDENT_AMBULATORY_CARE_PROVIDER_SITE_OTHER): Payer: Medicare Other | Admitting: Family Medicine

## 2020-09-29 DIAGNOSIS — R059 Cough, unspecified: Secondary | ICD-10-CM

## 2020-09-29 DIAGNOSIS — I6523 Occlusion and stenosis of bilateral carotid arteries: Secondary | ICD-10-CM | POA: Diagnosis not present

## 2020-09-29 DIAGNOSIS — J040 Acute laryngitis: Secondary | ICD-10-CM | POA: Diagnosis not present

## 2020-09-29 DIAGNOSIS — R739 Hyperglycemia, unspecified: Secondary | ICD-10-CM

## 2020-09-29 MED ORDER — BENZONATATE 100 MG PO CAPS: 100.0000 mg | ORAL_CAPSULE | Freq: Three times a day (TID) | ORAL | 0 refills | Status: DC | PRN

## 2020-09-29 NOTE — Progress Notes (Signed)
Virtual Visit via Video Note  I connected with [name]   on 09/29/20 at 10:20 AM EST by a video enabled telemedicine application and verified that I am speaking with the correct person using two identifiers.  Location patient: home, [place] Location provider:work or home office Persons participating in the virtual visit: patient, provider, granddaughter  I discussed the limitations of evaluation and management by telemedicine and the availability of in person appointments. The patient expressed understanding and agreed to proceed.   HPI:  Acute telemedicine visit for sore throat: -Onset: about 1 week ago -Symptoms include: scratchy throat, some aches and pains, laryngitis, headaches, a cough sometimes -reports is doing better now with improvement with headaches and body aches - but has chronic pain she takes medication -voice is starting to return -Denies: CP, SOB, vomiting, diarrhea, inability to eat/drink/get our of bed -Has tried: robitussin, cold -Pertinent past medical history: HTN, hx of CVD, DM,  -Pertinent medication allergies: detrol, percocet, conjugated -COVID-19 vaccine status: fully vaccinated x 3 -BS have been a little higher than her usual sugars the last few days, reports they usually run higher when she is sick, in the low 200s - but she had been sucking on candy to help her throat/cough  ROS: See pertinent positives and negatives per HPI.  Past Medical History:  Diagnosis Date  . Anxiety   . Arthritis HANDS, NECK , BACK  . Asymptomatic carotid artery stenosis LEFT --  MOD. PER DR WILLIS NOTE OCT 2012  . Benign heart murmur   . Depression    PMH of  . Diabetes mellitus [facility]  . DVT (deep venous thrombosis) ([place]) 2006   after arthroscopy  . Frequency of urination   . History of CVA (cerebrovascular accident) NEUROLOGIST  DR WILLIS----  06-01-2010-  LEFT BASAL GANGLIA HEMORRAGE   RESIDUAL RIGHT HAND NUMBNESS/TINGLING  . Hyperlipidemia   . Hypertension   .  Hypothyroid   . MRSA (methicillin resistant staph aureus) culture positive    X3; last post TKR  . Nocturia   . Numbness and tingling in right hand RESIDUAL FROM CVA NOV 2011  . Obesity    Redux therapy  . Osteoporosis   . Thyroid disease    right thyroid nodule-- BX DONE 08-15-2011    Past Surgical History:  Procedure Laterality Date  . ABDOMINAL HYSTERECTOMY  1973   Endometriosis & fibroid  . CARPAL TUNNEL RELEASE  2000   RIGHT  . COLONOSCOPY  1999 & 2005   Dr Olevia Perches  . cystoscope     to evaluate recurrent UTIs  . CYSTOSCOPY W/ RETROGRADES  08/22/2011   Procedure: CYSTOSCOPY WITH RETROGRADE PYELOGRAM;  Surgeon: Molli Hazard, MD;  Location: [facility];  Service: Urology;  Laterality: Bilateral;  . CYSTOSCOPY WITH BIOPSY  08/22/2011   Procedure: CYSTOSCOPY WITH BIOPSY;  Surgeon: Molli Hazard, MD;  Location: [facility];  Service: Urology;  Laterality: N/A;  . g2 p2    . JOINT REPLACEMENT  03-28-2006   LEFT THUMB  . KNEE ARTHROSCOPY     BILATERAL PRIOR TO TOTAL KNEE  . LEFT SHOULDER SURG  1990'S  . LUMBAR LAMINECTOMY  08-26-2009   L 4 - 5  . THYROID NODULE BX  08-15-2011   RIGHT  . TOTAL KNEE ARTHROPLASTY  05-26-2002      LEFT   AND RIGHT  03-30-2098  . WRIST SURGERY  2014   tendon placed in joint; Dr Burney Gauze     Current  Outpatient Medications:  .  benzonatate (TESSALON PERLES) 100 MG capsule, Take 1 capsule (100 mg total) by mouth 3 (three) times daily as needed., Disp: 20 capsule, Rfl: 0 .  aspirin 81 MG tablet, Take 81 mg by mouth at bedtime. , Disp: , Rfl:  .  benazepril (LOTENSIN) 20 MG tablet, TAKE 1 TABLET DAILY, Disp: 90 tablet, Rfl: 3 .  blood glucose meter kit and supplies KIT, Dispense based on patient and insurance preference. Use up to four times daily as directed. (FOR E11.9)., Disp: 1 each, Rfl: 0 .  calcium citrate-vitamin D 500-400 MG-UNIT chewable tablet, Chew 1 tablet by mouth at bedtime. , Disp: , Rfl:   .  Cholecalciferol (VITAMIN D3) 1000 UNITS CAPS, Take 1 capsule by mouth daily., Disp: , Rfl:  .  CRANBERRY PO, Take 300 mg by mouth daily., Disp: , Rfl:  .  cycloSPORINE (RESTASIS) 0.05 % ophthalmic emulsion, Place 1 drop into both eyes 2 (two) times daily., Disp: , Rfl:  .  diazepam (VALIUM) 5 MG tablet, Take 1 tablet (5 mg total) by mouth every 12 (twelve) hours as needed for anxiety., Disp: 180 tablet, Rfl: 0 .  Dulaglutide (TRULICITY) 3 OY/7.7AJ SOPN, Inject 3 mg as directed once a week., Disp: 2 mL, Rfl: 5 .  escitalopram (LEXAPRO) 10 MG tablet, TAKE 1 TABLET DAILY, Disp: 90 tablet, Rfl: 3 .  estradiol (ESTRACE) 0.1 MG/GM vaginal cream, OINOMVEH:2.0 Applicator Vaginal 3 Times a Week, Disp: , Rfl:  .  levothyroxine (SYNTHROID) 50 MCG tablet, Take 1 tablet (50 mcg total) by mouth daily., Disp: 90 tablet, Rfl: 3 .  metFORMIN (GLUCOPHAGE-XR) 500 MG 24 hr tablet, TAKE 2 TABLETS (1000 MG) WITH BREAKFAST AND 1 TABLET WITH DINNER, Disp: 270 tablet, Rfl: 1 .  methenamine (HIPREX) 1 g tablet, Take 1 g by mouth 2 (two) times daily., Disp: , Rfl:  .  methylcellulose (ARTIFICIAL TEARS) 1 % ophthalmic solution, Place 1 drop into both eyes daily as needed (itching eyes). , Disp: , Rfl:  .  Multiple Vitamins-Calcium (ONE-A-DAY WOMENS FORMULA) TABS, Take 1 tablet by mouth daily., Disp: , Rfl:  .  pravastatin (PRAVACHOL) 40 MG tablet, TAKE 1 TABLET AT BEDTIME, Disp: 90 tablet, Rfl: 3  EXAM:  VITALS per patient if applicable:  GENERAL: alert, oriented, appears well and in no acute distress  HEENT: atraumatic, conjunttiva clear, no obvious abnormalities on inspection of external nose and ears  NECK: normal movements of the head and neck  LUNGS: on inspection no signs of respiratory distress, breathing rate appears normal, no obvious gross SOB, gasping or wheezing  CV: no obvious cyanosis  MS: moves all visible extremities without noticeable abnormality  PSYCH/NEURO: pleasant and cooperative, no  obvious depression or anxiety, speech and thought processing grossly intact  ASSESSMENT AND PLAN:  Discussed the following assessment and plan:  Laryngitis  Cough  Hyperglycemia  -we discussed possible serious and likely etiologies, options for evaluation and workup, limitations of telemedicine visit vs in person visit, treatment, treatment risks and precautions. Pt prefers to treat via telemedicine empirically rather than in person at this moment.  Likely viral upper respiratory infection, likely enteritis versus other.  She had a negative Covid test, so that is less likely.  It seems per her report she is now improving.  Opted for symptomatic care measures with warm fluids, a Tessalon prescription for cough, sugar-free lozenges and follow-up if worsening or not improving further over the next several days. For the hyperglycemia, advised lifestyle changes with  cutting out the candy, decreasing sweets in the diet and improving diet.  Advised to continue to monitor and if worsening or not improving to contact her primary care office for a follow-up appointment to address further. Scheduled follow up with PCP offered: Agrees to schedule follow-up as needed through her primary care office. Advised to seek prompt in person care if worsening, new symptoms arise, or if is not improving with treatment. Discussed options for inperson care if PCP office not available. Did let this patient know that I only do telemedicine on Tuesdays and Thursdays for [place]. Advised to schedule follow up visit with PCP or [facility] if any further questions or concerns to avoid delays in care.   I discussed the assessment and treatment plan with the patient. The patient was provided an opportunity to ask questions and all were answered. The patient agreed with the plan and demonstrated an understanding of the instructions.     Lucretia Kern, DO

## 2020-09-29 NOTE — Patient Instructions (Signed)
-  warm liquids  -stay away from sweets and dairy  -eat a healthy diet  -stay hydrated  -I sent the medication(s) we discussed to your pharmacy: Meds ordered this encounter  Medications  . benzonatate (TESSALON PERLES) 100 MG capsule    Sig: Take 1 capsule (100 mg total) by mouth 3 (three) times daily as needed.    Dispense:  20 capsule    Refill:  0   -monitor blood sugars and if continue to run high schedule a follow up visit with your Primary Care Doctor  I hope you are feeling all better soon!  Seek in person care promptly if your symptoms worsen, new concerns arise or you are not improving with treatment.  It was nice to meet you today. I help  out with telemedicine visits on Tuesdays and Thursdays and am available for visits on those days. If you have any concerns or questions following this visit please schedule a follow up visit with your Primary Care doctor or seek care at a local urgent care clinic to avoid delays in care.

## 2020-10-20 ENCOUNTER — Other Ambulatory Visit: Payer: Self-pay | Admitting: Internal Medicine

## 2020-10-24 ENCOUNTER — Encounter: Payer: Self-pay | Admitting: Internal Medicine

## 2020-10-24 ENCOUNTER — Telehealth (INDEPENDENT_AMBULATORY_CARE_PROVIDER_SITE_OTHER): Payer: Medicare Other | Admitting: Internal Medicine

## 2020-10-24 DIAGNOSIS — J209 Acute bronchitis, unspecified: Secondary | ICD-10-CM | POA: Diagnosis not present

## 2020-10-24 MED ORDER — BENZONATATE 100 MG PO CAPS: 100.0000 mg | ORAL_CAPSULE | Freq: Three times a day (TID) | ORAL | 0 refills | Status: DC | PRN

## 2020-10-24 MED ORDER — CEFDINIR 300 MG PO CAPS: 300.0000 mg | ORAL_CAPSULE | Freq: Two times a day (BID) | ORAL | 0 refills | Status: DC

## 2020-10-24 NOTE — Assessment & Plan Note (Signed)
Acute Symptoms started approximately 3 weeks ago Concern for bacterial infection Start Omnicef 300 mg twice daily x10 days Continue benzonatate 100 mg 3 times daily as needed for cough Okay to continue over-the-counter cold medications Rest, fluids Call if no improvement

## 2020-10-24 NOTE — Progress Notes (Signed)
Virtual Visit via telephone Note  I connected with [name] on 10/24/20 at  3:40 PM EDT by telephone and verified that I am speaking with the correct person using two identifiers.   I discussed the limitations of evaluation and management by telemedicine and the availability of in person appointments. The patient expressed understanding and agreed to proceed.  Present for the visit:  Myself, Dr Billey Gosling, Daine Floras.  The patient is currently at home and I am in the office.    No referring provider.    History of Present Illness: She is here for an acute visit for cold symptoms.   Her symptoms started yesterday morning but has not felt well since 3 weeks ago - had virtual visit for cold symptoms.  Her symptoms were getting better by the time she did the virtual visit, but then got worse again.  She is experiencing hoarseness, nasal congestion, sore throat, ear pressure at times, postnasal drip, cough that is productive of yellow sputum, shortness of breath, tightness in her chest and headaches.  She has tried taking robitussin  Had covid test  - neg  Review of Systems  Constitutional: Negative for chills and fever.       Appetite dec  HENT: Positive for congestion (in morning mostly) and sore throat. Negative for ear pain (pressure at times) and sinus pain.        Hoarseness, some PND  Respiratory: Positive for cough, sputum production (yellow) and shortness of breath. Negative for wheezing.        Tightness in chest  Gastrointestinal: Negative for nausea.  Neurological: Positive for headaches. Negative for dizziness.      Social History   Socioeconomic History  . Marital status: Widowed    Spouse name: Not on file  . Number of children: 2  . Years of education: Not on file  . Highest education level: Not on file  Occupational History  . Not on file  Tobacco Use  . Smoking status: Never Smoker  . Smokeless tobacco: Never Used  Vaping Use  . Vaping Use: Never  used  Substance and Sexual Activity  . Alcohol use: Yes    Comment:  very rarely , < 1 glass wine / month  . Drug use: No  . Sexual activity: Not Currently  Other Topics Concern  . Not on file  Social History Narrative  . Not on file   Social Determinants of Health   Financial Resource Strain: Not on file  Food Insecurity: Not on file  Transportation Needs: Not on file  Physical Activity: Not on file  Stress: Not on file  Social Connections: Not on file      Assessment and Plan:  See Problem List for Assessment and Plan of chronic medical problems.   Follow Up Instructions:    I discussed the assessment and treatment plan with the patient. The patient was provided an opportunity to ask questions and all were answered. The patient agreed with the plan and demonstrated an understanding of the instructions.   The patient was advised to call back or seek an in-person evaluation if the symptoms worsen or if the condition fails to improve as anticipated.  Time spent on telephone call - 8 minutes  Binnie Rail, MD

## 2020-11-18 ENCOUNTER — Telehealth: Payer: Self-pay | Admitting: Internal Medicine

## 2020-11-18 ENCOUNTER — Other Ambulatory Visit: Payer: Self-pay

## 2020-11-18 MED ORDER — TRULICITY 3 MG/0.5ML ~~LOC~~ SOAJ: 3.0000 mg | SUBCUTANEOUS | 5 refills | Status: DC

## 2020-11-18 NOTE — Telephone Encounter (Signed)
  Dulaglutide (TRULICITY) 3 YI/9.4WN SOPN [facility] and stat[facility]es they never got the 3 mg, patient requesting we send it in for her. Also wanted to remind [place] she will no longer need the [place] Phone:  [phone]  Fax:  [phone]

## 2020-11-18 NOTE — Telephone Encounter (Signed)
Sent in for patient today. 

## 2020-11-24 NOTE — Telephone Encounter (Signed)
   Patient requesting order for Trulicity is sent for 90 day supply To [facility]  The are only sending her a 28 day refill per patient

## 2020-11-25 ENCOUNTER — Other Ambulatory Visit: Payer: Self-pay

## 2020-11-25 MED ORDER — TRULICITY 3 MG/0.5ML ~~LOC~~ SOAJ: 3.0000 mg | SUBCUTANEOUS | 2 refills | Status: DC

## 2020-11-25 NOTE — Addendum Note (Signed)
Addended by: Binnie Rail on: 11/25/2020 03:52 PM   Modules accepted: Orders

## 2020-11-29 DIAGNOSIS — N3020 Other chronic cystitis without hematuria: Secondary | ICD-10-CM | POA: Diagnosis not present

## 2020-12-22 DIAGNOSIS — N3020 Other chronic cystitis without hematuria: Secondary | ICD-10-CM | POA: Diagnosis not present

## 2021-01-03 ENCOUNTER — Other Ambulatory Visit: Payer: Self-pay | Admitting: Internal Medicine

## 2021-01-24 ENCOUNTER — Other Ambulatory Visit: Payer: Self-pay | Admitting: Internal Medicine

## 2021-02-15 ENCOUNTER — Other Ambulatory Visit: Payer: Self-pay | Admitting: Internal Medicine

## 2021-02-17 ENCOUNTER — Encounter (HOSPITAL_COMMUNITY): Payer: Self-pay

## 2021-02-17 ENCOUNTER — Telehealth: Payer: Self-pay | Admitting: Internal Medicine

## 2021-02-17 ENCOUNTER — Ambulatory Visit (HOSPITAL_COMMUNITY)
Admission: EM | Admit: 2021-02-17 | Discharge: 2021-02-17 | Disposition: A | Discharge status: Home / Self Care | Payer: Medicare Other

## 2021-02-17 ENCOUNTER — Encounter (HOSPITAL_COMMUNITY): Payer: Self-pay | Admitting: Emergency Medicine

## 2021-02-17 ENCOUNTER — Emergency Department (HOSPITAL_COMMUNITY)
Admission: EM | Admit: 2021-02-17 | Discharge: 2021-02-17 | Disposition: A | Discharge status: Home / Self Care | Payer: Medicare Other | Attending: Emergency Medicine | Admitting: Emergency Medicine

## 2021-02-17 ENCOUNTER — Emergency Department (HOSPITAL_COMMUNITY): Payer: Medicare Other

## 2021-02-17 ENCOUNTER — Other Ambulatory Visit: Payer: Self-pay

## 2021-02-17 DIAGNOSIS — E039 Hypothyroidism, unspecified: Secondary | ICD-10-CM | POA: Insufficient documentation

## 2021-02-17 DIAGNOSIS — E119 Type 2 diabetes mellitus without complications: Secondary | ICD-10-CM | POA: Insufficient documentation

## 2021-02-17 DIAGNOSIS — Y9301 Activity, walking, marching and hiking: Secondary | ICD-10-CM | POA: Insufficient documentation

## 2021-02-17 DIAGNOSIS — Z7984 Long term (current) use of oral hypoglycemic drugs: Secondary | ICD-10-CM | POA: Diagnosis not present

## 2021-02-17 DIAGNOSIS — S161XXA Strain of muscle, fascia and tendon at neck level, initial encounter: Secondary | ICD-10-CM | POA: Insufficient documentation

## 2021-02-17 DIAGNOSIS — Z96653 Presence of artificial knee joint, bilateral: Secondary | ICD-10-CM | POA: Insufficient documentation

## 2021-02-17 DIAGNOSIS — Z7982 Long term (current) use of aspirin: Secondary | ICD-10-CM | POA: Insufficient documentation

## 2021-02-17 DIAGNOSIS — S169XXA Unspecified injury of muscle, fascia and tendon at neck level, initial encounter: Secondary | ICD-10-CM | POA: Diagnosis present

## 2021-02-17 DIAGNOSIS — S0990XA Unspecified injury of head, initial encounter: Secondary | ICD-10-CM

## 2021-02-17 DIAGNOSIS — S0003XA Contusion of scalp, initial encounter: Secondary | ICD-10-CM | POA: Insufficient documentation

## 2021-02-17 DIAGNOSIS — W19XXXA Unspecified fall, initial encounter: Secondary | ICD-10-CM

## 2021-02-17 DIAGNOSIS — W01198A Fall on same level from slipping, tripping and stumbling with subsequent striking against other object, initial encounter: Secondary | ICD-10-CM | POA: Diagnosis not present

## 2021-02-17 DIAGNOSIS — M4312 Spondylolisthesis, cervical region: Secondary | ICD-10-CM | POA: Diagnosis not present

## 2021-02-17 DIAGNOSIS — Z794 Long term (current) use of insulin: Secondary | ICD-10-CM | POA: Diagnosis not present

## 2021-02-17 DIAGNOSIS — I10 Essential (primary) hypertension: Secondary | ICD-10-CM | POA: Diagnosis not present

## 2021-02-17 DIAGNOSIS — Z043 Encounter for examination and observation following other accident: Secondary | ICD-10-CM | POA: Diagnosis not present

## 2021-02-17 DIAGNOSIS — M47812 Spondylosis without myelopathy or radiculopathy, cervical region: Secondary | ICD-10-CM | POA: Diagnosis not present

## 2021-02-17 DIAGNOSIS — Z79899 Other long term (current) drug therapy: Secondary | ICD-10-CM | POA: Diagnosis not present

## 2021-02-17 MED ORDER — ONDANSETRON 8 MG PO TBDP
8.0000 mg | ORAL_TABLET | Freq: Once | ORAL | Status: AC
  Administered 2021-02-17: 8 mg via ORAL
  Filled 2021-02-17: qty 1

## 2021-02-17 MED ORDER — ACETAMINOPHEN 325 MG PO TABS
650.0000 mg | ORAL_TABLET | Freq: Once | ORAL | Status: AC
  Administered 2021-02-17: 650 mg via ORAL
  Filled 2021-02-17: qty 2

## 2021-02-17 NOTE — Telephone Encounter (Signed)
Patient currently at [facility].

## 2021-02-17 NOTE — ED Provider Notes (Signed)
[facility] DEPT Provider Note   CSN: [id number] Arrival date & time: 02/17/21  1359     History Chief Complaint  Patient presents with   Fall   Head Injury    [name] is a 80 y.o. female.  He is here for evaluation of injuries after a fall.  Said she was walking through the door and got hung up on the door handle and fell striking her head on the back, brick wall.  No loss of consciousness.  She has been ambulatory since then but feels a little unsteady and nauseous.  She has not taken anything for it.  She is only on a daily aspirin, no other blood thinner.  No other injuries or complaints.  The history is provided by the patient.  Fall This is a new problem. The current episode started 1 to 2 hours ago. The problem occurs constantly. The problem has not changed since onset.Associated symptoms include headaches. Pertinent negatives include no chest pain, no abdominal pain and no shortness of breath. Nothing aggravates the symptoms. Nothing relieves the symptoms. She has tried nothing for the symptoms. The treatment provided no relief.  Head Injury Associated symptoms: headache and neck pain       Past Medical History:  Diagnosis Date   Anxiety    Arthritis HANDS, NECK , BACK   Asymptomatic carotid artery stenosis LEFT --  MOD. PER DR WILLIS NOTE OCT 2012   Benign heart murmur    Depression    PMH of   Diabetes mellitus [facility]   DVT (deep venous thrombosis) ([place]) 2006   after arthroscopy   Frequency of urination    History of CVA (cerebrovascular accident) NEUROLOGIST  DR WILLIS----  06-01-2010-  LEFT BASAL GANGLIA HEMORRAGE   RESIDUAL RIGHT HAND NUMBNESS/TINGLING   Hyperlipidemia    Hypertension    Hypothyroid    MRSA (methicillin resistant staph aureus) culture positive    X3; last post TKR   Nocturia    Numbness and tingling in right hand RESIDUAL FROM CVA NOV 2011   Obesity    Redux therapy   Osteoporosis    Thyroid disease     right thyroid nodule-- BX DONE 08-15-2011    Patient Active Problem List   Diagnosis Date Noted   Acute bronchitis 10/24/2020   Cholelithiasis 03/09/2020   Low back pain 10/29/2019   Pain in left wrist 10/01/2019   Cervicalgia 07/29/2019   Trigger finger, left middle finger 04/28/2018   Hypothyroidism 03/29/2017   Generalized anxiety disorder 06/20/2015   SUI (stress urinary incontinence, female) 02/05/2014   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   Cerebral artery occlusion with cerebral infarction ([place]) 05/31/2010   DM (diabetes mellitus), secondary, uncontrolled, with renal complications ([place]) 95/62/1308   THYROID NODULE, RIGHT 01/21/2009   Vitamin D deficiency 12/10/2007   HYPERLIPIDEMIA 06/17/2007   Essential hypertension 06/17/2007   Osteopenia 06/17/2007   Osteoarthrosis, unspecified whether generalized or localized, unspecified site 11/11/2006    Past Surgical History:  Procedure Laterality Date   ABDOMINAL HYSTERECTOMY  1973   Endometriosis & fibroid   CARPAL TUNNEL RELEASE  2000   RIGHT   COLONOSCOPY  1999 & 2005   Dr Olevia Perches   cystoscope     to evaluate recurrent UTIs   CYSTOSCOPY W/ RETROGRADES  08/22/2011   Procedure: CYSTOSCOPY WITH RETROGRADE PYELOGRAM;  Surgeon: Molli Hazard, MD;  Location: [facility];  Service: Urology;  Laterality: Bilateral;   CYSTOSCOPY WITH BIOPSY  08/22/2011   Procedure: CYSTOSCOPY WITH BIOPSY;  Surgeon: Molli Hazard, MD;  Location: [facility];  Service: Urology;  Laterality: N/A;   g2 p2     JOINT REPLACEMENT  03-28-2006   LEFT THUMB   KNEE ARTHROSCOPY     BILATERAL PRIOR TO TOTAL KNEE   LEFT SHOULDER SURG  1990'S   LUMBAR LAMINECTOMY  08-26-2009   L 4 - 5   THYROID NODULE BX  08-15-2011   RIGHT   TOTAL KNEE ARTHROPLASTY  05-26-2002      LEFT   AND RIGHT  03-30-2098   WRIST SURGERY  2014   tendon placed in joint; Dr Burney Gauze     OB History   No obstetric history on file.      Family History  Problem Relation Age of Onset   Bladder Cancer Mother    Diabetes Mother    Brain cancer Sister    Stroke Paternal Grandfather        in 59s   Diabetes Sister 8       TYPE 1    Prostate cancer Brother    Alzheimer's disease Brother    Diabetes Maternal Aunt    Diabetes Maternal Uncle    Diabetes Maternal Grandmother        Type 2   Diabetes Maternal Grandfather        Type 2   Pancreatic cancer Sister    Kidney failure Sister        in context of DM & influenza   Heart disease Neg Hx     Social History   Tobacco Use   Smoking status: Never   Smokeless tobacco: Never  Vaping Use   Vaping Use: Never used  Substance Use Topics   Alcohol use: Yes    Comment:  very rarely , < 1 glass wine / month   Drug use: No    Home Medications Prior to Admission medications   Medication Sig Start Date End Date Taking? Authorizing Provider  aspirin 81 MG tablet Take 81 mg by mouth at bedtime.     [provider]  benazepril (LOTENSIN) 20 MG tablet TAKE 1 TABLET DAILY 05/27/20   Burns, Claudina Lick, MD  benzonatate (TESSALON PERLES) 100 MG capsule Take 1 capsule (100 mg total) by mouth 3 (three) times daily as needed. 10/24/20   Binnie Rail, MD  blood glucose meter kit and supplies KIT Dispense based on patient and insurance preference. Use up to four times daily as directed. (FOR E11.9). 03/09/20   Binnie Rail, MD  calcium citrate-vitamin D 500-400 MG-UNIT chewable tablet Chew 1 tablet by mouth at bedtime.     [provider]  cefdinir (OMNICEF) 300 MG capsule Take 1 capsule (300 mg total) by mouth 2 (two) times daily. 10/24/20   Binnie Rail, MD  Cholecalciferol (VITAMIN D3) 1000 UNITS CAPS Take 1 capsule by mouth daily.    [provider]  CRANBERRY PO Take 300 mg by mouth daily.    [provider]  cycloSPORINE (RESTASIS) 0.05 % ophthalmic emulsion Place 1 drop into both eyes 2 (two) times daily.    [provider]   diazepam (VALIUM) 5 MG tablet TAKE 1 TABLET EVERY 12 HOURS AS NEEDED FOR ANXIETY 01/03/21   Burns, Claudina Lick, MD  Dulaglutide (TRULICITY) 3 WU/8.8BV SOPN Inject 3 mg as directed once a week. 11/25/20   Binnie Rail, MD  escitalopram (LEXAPRO) 10 MG tablet TAKE 1 TABLET  DAILY 02/24/20   Binnie Rail, MD  estradiol (ESTRACE) 0.1 MG/GM vaginal cream DXAJOINO:6.7 Applicator Vaginal 3 Times a Week 08/26/20   [provider]  levothyroxine (SYNTHROID) 50 MCG tablet Take 1 tablet (50 mcg total) by mouth daily. 03/14/20   Binnie Rail, MD  metFORMIN (GLUCOPHAGE-XR) 500 MG 24 hr tablet TAKE 2 TABLETS WITH BREAKFAST AND 1 TABLET WITH DINNER 01/24/21   Burns, Claudina Lick, MD  methenamine (HIPREX) 1 g tablet Take 1 g by mouth 2 (two) times daily. 09/02/20   [provider]  methylcellulose (ARTIFICIAL TEARS) 1 % ophthalmic solution Place 1 drop into both eyes daily as needed (itching eyes).     [provider]  Multiple Vitamins-Calcium (ONE-A-DAY WOMENS FORMULA) TABS Take 1 tablet by mouth daily.    [provider]  pravastatin (PRAVACHOL) 40 MG tablet TAKE 1 TABLET AT BEDTIME 02/15/21   Binnie Rail, MD    Allergies    Conjugated estrogens, Percocet [oxycodone-acetaminophen], and Detrol [tolterodine]  Review of Systems   Review of Systems  Constitutional:  Negative for fever.  HENT:  Negative for sore throat.   Eyes:  Negative for pain.  Respiratory:  Negative for shortness of breath.   Cardiovascular:  Negative for chest pain.  Gastrointestinal:  Negative for abdominal pain.  Genitourinary:  Negative for dysuria.  Musculoskeletal:  Positive for neck pain.  Skin:  Negative for rash.  Neurological:  Positive for headaches.   Physical Exam Updated Vital Signs BP (!) 160/85 (BP Location: Right Arm)   Pulse 72   Temp 98.7 F (37.1 C) (Oral)   Resp 16   Ht 5' 6" (1.676 m)   Wt 73.9 kg   SpO2 92%   BMI 26.31 kg/m   Physical Exam Vitals and nursing note reviewed.   Constitutional:      General: She is not in acute distress.    Appearance: Normal appearance. She is well-developed.  HENT:     Head: Normocephalic.     Comments: Occipital hematoma Eyes:     Conjunctiva/sclera: Conjunctivae normal.  Cardiovascular:     Rate and Rhythm: Normal rate and regular rhythm.     Heart sounds: No murmur heard. Pulmonary:     Effort: Pulmonary effort is normal. No respiratory distress.     Breath sounds: Normal breath sounds.  Abdominal:     Palpations: Abdomen is soft.     Tenderness: There is no abdominal tenderness.  Musculoskeletal:        General: No deformity or signs of injury. Normal range of motion.     Cervical back: Neck supple.  Skin:    General: Skin is warm and dry.  Neurological:     General: No focal deficit present.     Mental Status: She is alert.    ED Results / Procedures / Treatments   Labs (all labs ordered are listed, but only abnormal results are displayed) Labs Reviewed - No data to display  EKG None  Radiology CT Head Wo Contrast  Result Date: 02/17/2021 CLINICAL DATA:  Head injury after fall. EXAM: CT HEAD WITHOUT CONTRAST CT CERVICAL SPINE WITHOUT CONTRAST TECHNIQUE: Multidetector CT imaging of the head and cervical spine was performed following the standard protocol without intravenous contrast. Multiplanar CT image reconstructions of the cervical spine were also generated. COMPARISON:  January 18, 2016. FINDINGS: CT HEAD FINDINGS Brain: No evidence of acute infarction, hemorrhage, hydrocephalus, extra-axial collection or mass lesion/mass effect. Vascular: No hyperdense vessel or unexpected  calcification. Skull: Normal. Negative for fracture or focal lesion. Sinuses/Orbits: No acute finding. Other: None. CT CERVICAL SPINE FINDINGS Alignment: Mild grade 1 anterolisthesis of C3-4 is noted secondary to posterior facet joint hypertrophy. Skull base and vertebrae: No acute fracture. No primary bone lesion or focal pathologic  process. Soft tissues and spinal canal: No prevertebral fluid or swelling. No visible canal hematoma. Disc levels: Severe degenerative disc disease is noted at C4-5, C5-6 and C6-7 with anterior osteophyte formation. Upper chest: Negative. Other: Degenerative changes are seen involving the left-sided posterior facet joints. IMPRESSION: No acute intracranial abnormality seen. Severe multilevel degenerative disc disease. No acute abnormality seen in the cervical spine. Electronically Signed   By: Marijo Conception M.D.   On: 02/17/2021 16:14   CT Cervical Spine Wo Contrast  Result Date: 02/17/2021 CLINICAL DATA:  Head injury after fall. EXAM: CT HEAD WITHOUT CONTRAST CT CERVICAL SPINE WITHOUT CONTRAST TECHNIQUE: Multidetector CT imaging of the head and cervical spine was performed following the standard protocol without intravenous contrast. Multiplanar CT image reconstructions of the cervical spine were also generated. COMPARISON:  January 18, 2016. FINDINGS: CT HEAD FINDINGS Brain: No evidence of acute infarction, hemorrhage, hydrocephalus, extra-axial collection or mass lesion/mass effect. Vascular: No hyperdense vessel or unexpected calcification. Skull: Normal. Negative for fracture or focal lesion. Sinuses/Orbits: No acute finding. Other: None. CT CERVICAL SPINE FINDINGS Alignment: Mild grade 1 anterolisthesis of C3-4 is noted secondary to posterior facet joint hypertrophy. Skull base and vertebrae: No acute fracture. No primary bone lesion or focal pathologic process. Soft tissues and spinal canal: No prevertebral fluid or swelling. No visible canal hematoma. Disc levels: Severe degenerative disc disease is noted at C4-5, C5-6 and C6-7 with anterior osteophyte formation. Upper chest: Negative. Other: Degenerative changes are seen involving the left-sided posterior facet joints. IMPRESSION: No acute intracranial abnormality seen. Severe multilevel degenerative disc disease. No acute abnormality seen in the  cervical spine. Electronically Signed   By: Marijo Conception M.D.   On: 02/17/2021 16:14    Procedures Procedures   Medications Ordered in ED Medications  ondansetron (ZOFRAN-ODT) disintegrating tablet 8 mg (has no administration in time range)  acetaminophen (TYLENOL) tablet 650 mg (has no administration in time range)    ED Course  I have reviewed the triage vital signs and the nursing notes.  Pertinent labs & imaging results that were available during my care of the patient were reviewed by me and considered in my medical decision making (see chart for details).    MDM Rules/Calculators/A&P                          This patient complains of fall head injury neck pain; this involves an extensive number of treatment Options and is a complaint that carries with it a high risk of complications and Morbidity. The differential includes skull fracture, intracranial bleed, cervical fracture, dislocation, cord contusion I ordered imaging studies which included CT head and CT cervical spine and I independently    visualized and interpreted imaging which showed no acute findings.  Does have significant degenerative changes in spine Previous records obtained and reviewed in epic, no recent admissions  After the interventions stated above, I reevaluated the patient and found patient symptoms to be controlled in the department.  She is ambulatory without any difficulty.  Reviewed results of work-up with her.  She is comfortable for symptomatic treatment at home.  Return instructions discussed.   Final Clinical Impression(s) /  ED Diagnoses Final diagnoses:  Fall, initial encounter  Injury of head, initial encounter  Hematoma of scalp, initial encounter  Strain of neck muscle, initial encounter    Rx / DC Orders ED Discharge Orders     None        Hayden Rasmussen, MD 02/17/21 [facility]

## 2021-02-17 NOTE — ED Triage Notes (Signed)
Pt presents with head injury after falling as she was walking out of house approx 2 hours ago. States fell backwards against the house and hit head on brick house. Pt states has headache with nausea.

## 2021-02-17 NOTE — Discharge Instructions (Addendum)
You are seen in the emergency department for evaluation of a fall in which he struck your head.  You had a CAT scan of your head and cervical spine.  Did not show any acute traumatic findings.  You had significant degenerative changes in your cervical spine.  You can use Tylenol as needed for pain.  Ice to the back of your head.  Follow-up with your doctor.  Return to the emergency department for any worsening or concerning symptoms

## 2021-02-17 NOTE — Telephone Encounter (Signed)
Patient fell outside against house 07.29.22  Patient says she has a huge knot on head (currently treating it w/ ice) also says she feels slightly dizzy  Would like a callback to determine if she needs to go to urgent care or ED

## 2021-02-17 NOTE — ED Triage Notes (Signed)
Patient reports that she had her arms full and thing s she had in her arms got hung on the door handle and caused her to fall. Patient has a hematoma to the back of her head. Patient states she has "a little bit of blurred vision"  Patient does take Aspirin 81 mg daily.

## 2021-02-17 NOTE — ED Triage Notes (Signed)
Patient is being discharged from the Urgent Care and sent to the Emergency Department via [place] with daughter . Per Curly [place], PA, patient is in need of higher level of care due to fall with headache and nausea. Patient is aware and verbalizes understanding of plan of care.  Vitals:   02/17/21 1337  BP: (!) 155/75  Pulse: 76  Resp: 17  SpO2: 96%

## 2021-02-20 DIAGNOSIS — N3020 Other chronic cystitis without hematuria: Secondary | ICD-10-CM | POA: Diagnosis not present

## 2021-02-23 DIAGNOSIS — S0990XA Unspecified injury of head, initial encounter: Secondary | ICD-10-CM | POA: Insufficient documentation

## 2021-02-23 NOTE — Progress Notes (Signed)
Subjective:    Patient ID: [name], female    DOB: Mar 06, 1941, 80 y.o.   MRN: [id number]  HPI The patient is here for follow up from the ED  She went to the ED 7/29 after fall at home.  She got hung up on the door handle and fell striking her head on a brick wall.  No LOC.  She had a headache, neck pain, was a little unsteady and nauseous, but ambulatory.  CT of the head and cervical spine showed no acute intracranial abnormality.  She does have severe multilevel degenerative disc disease in her cervical spine.  No treatment was needed.   At home she has been having headaches and dizziness.  Her balance is not as good.  One day this week she had positional dizziness.  If she rolls over in the bed too fast she will feel dizzy.  She feels tired and blah.  Headaches are intermittent and mild.  Overall her symptoms are much better than they had been.  She has done too much this week.  She has not been exercising.  She is doing a little reading, but sometimes her eyes get tired.  She denies any changes in vision.  She has not had any recent nausea.   Medications and allergies reviewed with patient and updated if appropriate.  Patient Active Problem List   Diagnosis Date Noted   Concussion 02/24/2021   Head injury due to trauma 02/23/2021   Acute bronchitis 10/24/2020   Cholelithiasis 03/09/2020   Low back pain 10/29/2019   Pain in left wrist 10/01/2019   Cervicalgia 07/29/2019   Trigger finger, left middle finger 04/28/2018   Hypothyroidism 03/29/2017   Generalized anxiety disorder 06/20/2015   SUI (stress urinary incontinence, female) 02/05/2014   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   Cerebral artery occlusion with cerebral infarction ([place]) 05/31/2010   DM (diabetes mellitus), secondary, uncontrolled, with renal complications ([place]) 96/78/9381   THYROID NODULE, RIGHT 01/21/2009   Vitamin D deficiency 12/10/2007   HYPERLIPIDEMIA 06/17/2007   Essential hypertension 06/17/2007    Osteopenia 06/17/2007   Osteoarthrosis, unspecified whether generalized or localized, unspecified site 11/11/2006    Current Outpatient Medications on File Prior to Visit  Medication Sig Dispense Refill   aspirin 81 MG tablet Take 81 mg by mouth at bedtime.      benazepril (LOTENSIN) 20 MG tablet TAKE 1 TABLET DAILY 90 tablet 3   benzonatate (TESSALON PERLES) 100 MG capsule Take 1 capsule (100 mg total) by mouth 3 (three) times daily as needed. 20 capsule 0   blood glucose meter kit and supplies KIT Dispense based on patient and insurance preference. Use up to four times daily as directed. (FOR E11.9). 1 each 0   calcium citrate-vitamin D 500-400 MG-UNIT chewable tablet Chew 1 tablet by mouth at bedtime.      cefdinir (OMNICEF) 300 MG capsule Take 1 capsule (300 mg total) by mouth 2 (two) times daily. 20 capsule 0   cephALEXin (KEFLEX) 250 MG capsule Take 250 mg by mouth daily.     Cholecalciferol (VITAMIN D3) 1000 UNITS CAPS Take 1 capsule by mouth daily.     CRANBERRY PO Take 300 mg by mouth daily.     cycloSPORINE (RESTASIS) 0.05 % ophthalmic emulsion Place 1 drop into both eyes 2 (two) times daily.     diazepam (VALIUM) 5 MG tablet TAKE 1 TABLET EVERY 12 HOURS AS NEEDED FOR ANXIETY 180 tablet 0   Dulaglutide (TRULICITY) 3 OF/7.5ZW  SOPN Inject 3 mg as directed once a week. 6 mL 2   escitalopram (LEXAPRO) 10 MG tablet TAKE 1 TABLET DAILY 90 tablet 3   estradiol (ESTRACE) 0.1 MG/GM vaginal cream VZDGLOVF:6.4 Applicator Vaginal 3 Times a Week     levothyroxine (SYNTHROID) 50 MCG tablet Take 1 tablet (50 mcg total) by mouth daily. 90 tablet 3   metFORMIN (GLUCOPHAGE-XR) 500 MG 24 hr tablet TAKE 2 TABLETS WITH BREAKFAST AND 1 TABLET WITH DINNER 270 tablet 3   methenamine (HIPREX) 1 g tablet Take 1 g by mouth 2 (two) times daily.     methylcellulose (ARTIFICIAL TEARS) 1 % ophthalmic solution Place 1 drop into both eyes daily as needed (itching eyes).      Multiple Vitamins-Calcium (ONE-A-DAY  WOMENS FORMULA) TABS Take 1 tablet by mouth daily.     nitrofurantoin, macrocrystal-monohydrate, (MACROBID) 100 MG capsule Take 100 mg by mouth 2 (two) times daily.     pravastatin (PRAVACHOL) 40 MG tablet TAKE 1 TABLET AT BEDTIME 90 tablet 3   No current facility-administered medications on file prior to visit.    Past Medical History:  Diagnosis Date   Anxiety    Arthritis HANDS, NECK , BACK   Asymptomatic carotid artery stenosis LEFT --  MOD. PER DR WILLIS NOTE OCT 2012   Benign heart murmur    Depression    PMH of   Diabetes mellitus [facility]   DVT (deep venous thrombosis) ([place]) 2006   after arthroscopy   Frequency of urination    History of CVA (cerebrovascular accident) NEUROLOGIST  DR WILLIS----  06-01-2010-  LEFT BASAL GANGLIA HEMORRAGE   RESIDUAL RIGHT HAND NUMBNESS/TINGLING   Hyperlipidemia    Hypertension    Hypothyroid    MRSA (methicillin resistant staph aureus) culture positive    X3; last post TKR   Nocturia    Numbness and tingling in right hand RESIDUAL FROM CVA NOV 2011   Obesity    Redux therapy   Osteoporosis    Thyroid disease    right thyroid nodule-- BX DONE 08-15-2011    Past Surgical History:  Procedure Laterality Date   ABDOMINAL HYSTERECTOMY  1973   Endometriosis & fibroid   CARPAL TUNNEL RELEASE  2000   RIGHT   COLONOSCOPY  1999 & 2005   Dr Olevia Perches   cystoscope     to evaluate recurrent UTIs   CYSTOSCOPY W/ RETROGRADES  08/22/2011   Procedure: CYSTOSCOPY WITH RETROGRADE PYELOGRAM;  Surgeon: Molli Hazard, MD;  Location: [facility];  Service: Urology;  Laterality: Bilateral;   CYSTOSCOPY WITH BIOPSY  08/22/2011   Procedure: CYSTOSCOPY WITH BIOPSY;  Surgeon: Molli Hazard, MD;  Location: [facility];  Service: Urology;  Laterality: N/A;   g2 p2     JOINT REPLACEMENT  03-28-2006   LEFT THUMB   KNEE ARTHROSCOPY     BILATERAL PRIOR TO TOTAL KNEE   LEFT SHOULDER SURG  1990'S   LUMBAR LAMINECTOMY   08-26-2009   L 4 - 5   THYROID NODULE BX  08-15-2011   RIGHT   TOTAL KNEE ARTHROPLASTY  05-26-2002      LEFT   AND RIGHT  03-30-2098   WRIST SURGERY  2014   tendon placed in joint; Dr Burney Gauze    Social History   Socioeconomic History   Marital status: Widowed    Spouse name: Not on file   Number of children: 2   Years of education: Not on file  Highest education level: Not on file  Occupational History   Not on file  Tobacco Use   Smoking status: Never   Smokeless tobacco: Never  Vaping Use   Vaping Use: Never used  Substance and Sexual Activity   Alcohol use: Yes    Comment:  very rarely , < 1 glass wine / month   Drug use: No   Sexual activity: Not Currently  Other Topics Concern   Not on file  Social History Narrative   Not on file   Social Determinants of Health   Financial Resource Strain: Not on file  Food Insecurity: Not on file  Transportation Needs: Not on file  Physical Activity: Not on file  Stress: Not on file  Social Connections: Not on file    Family History  Problem Relation Age of Onset   Bladder Cancer Mother    Diabetes Mother    Brain cancer Sister    Stroke Paternal Grandfather        in 45s   Diabetes Sister 25       TYPE 1    Prostate cancer Brother    Alzheimer's disease Brother    Diabetes Maternal Aunt    Diabetes Maternal Uncle    Diabetes Maternal Grandmother        Type 2   Diabetes Maternal Grandfather        Type 2   Pancreatic cancer Sister    Kidney failure Sister        in context of DM & influenza   Heart disease Neg Hx     Review of Systems  Constitutional:  Positive for fatigue. Negative for fever.  Eyes:  Negative for visual disturbance.  Gastrointestinal:  Positive for nausea.  Neurological:  Positive for dizziness and headaches. Negative for weakness and numbness.      Objective:   Vitals:   02/24/21 1039  BP: 120/62  Pulse: 84  Temp: 97.9 F (36.6 C)  SpO2: 98%   BP Readings from Last 3  Encounters:  02/24/21 120/62  02/17/21 (!) 145/90  09/15/20 130/72   Wt Readings from Last 3 Encounters:  02/24/21 160 lb (72.6 kg)  02/17/21 163 lb (73.9 kg)  09/15/20 167 lb (75.8 kg)   Body mass index is 25.82 kg/m.   Physical Exam Constitutional:      General: She is not in acute distress.    Appearance: Normal appearance. She is not ill-appearing.  HENT:     Head: Normocephalic.     Comments: Small hematoma associated with swelling occipital region Cardiovascular:     Rate and Rhythm: Normal rate and regular rhythm.     Heart sounds: No murmur heard. Pulmonary:     Effort: Pulmonary effort is normal. No respiratory distress.     Breath sounds: No wheezing or rales.  Musculoskeletal:     Right lower leg: No edema.     Left lower leg: No edema.  Skin:    General: Skin is warm and dry.  Neurological:     General: No focal deficit present.     Mental Status: She is alert and oriented to person, place, and time.     Sensory: No sensory deficit.     Motor: No weakness.  Psychiatric:        Mood and Affect: Mood normal.       CT Cervical Spine Wo Contrast CLINICAL DATA:  Head injury after fall.  EXAM: CT HEAD WITHOUT CONTRAST  CT CERVICAL SPINE  WITHOUT CONTRAST  TECHNIQUE: Multidetector CT imaging of the head and cervical spine was performed following the standard protocol without intravenous contrast. Multiplanar CT image reconstructions of the cervical spine were also generated.  COMPARISON:  January 18, 2016.  FINDINGS: CT HEAD FINDINGS  Brain: No evidence of acute infarction, hemorrhage, hydrocephalus, extra-axial collection or mass lesion/mass effect.  Vascular: No hyperdense vessel or unexpected calcification.  Skull: Normal. Negative for fracture or focal lesion.  Sinuses/Orbits: No acute finding.  Other: None.  CT CERVICAL SPINE FINDINGS  Alignment: Mild grade 1 anterolisthesis of C3-4 is noted secondary to posterior facet joint  hypertrophy.  Skull base and vertebrae: No acute fracture. No primary bone lesion or focal pathologic process.  Soft tissues and spinal canal: No prevertebral fluid or swelling. No visible canal hematoma.  Disc levels: Severe degenerative disc disease is noted at C4-5, C5-6 and C6-7 with anterior osteophyte formation.  Upper chest: Negative.  Other: Degenerative changes are seen involving the left-sided posterior facet joints.  IMPRESSION: No acute intracranial abnormality seen.  Severe multilevel degenerative disc disease. No acute abnormality seen in the cervical spine.  Electronically Signed   By: Marijo Conception M.D.   On: 02/17/2021 16:14 CT Head Wo Contrast CLINICAL DATA:  Head injury after fall.  EXAM: CT HEAD WITHOUT CONTRAST  CT CERVICAL SPINE WITHOUT CONTRAST  TECHNIQUE: Multidetector CT imaging of the head and cervical spine was performed following the standard protocol without intravenous contrast. Multiplanar CT image reconstructions of the cervical spine were also generated.  COMPARISON:  January 18, 2016.  FINDINGS: CT HEAD FINDINGS  Brain: No evidence of acute infarction, hemorrhage, hydrocephalus, extra-axial collection or mass lesion/mass effect.  Vascular: No hyperdense vessel or unexpected calcification.  Skull: Normal. Negative for fracture or focal lesion.  Sinuses/Orbits: No acute finding.  Other: None.  CT CERVICAL SPINE FINDINGS  Alignment: Mild grade 1 anterolisthesis of C3-4 is noted secondary to posterior facet joint hypertrophy.  Skull base and vertebrae: No acute fracture. No primary bone lesion or focal pathologic process.  Soft tissues and spinal canal: No prevertebral fluid or swelling. No visible canal hematoma.  Disc levels: Severe degenerative disc disease is noted at C4-5, C5-6 and C6-7 with anterior osteophyte formation.  Upper chest: Negative.  Other: Degenerative changes are seen involving the  left-sided posterior facet joints.  IMPRESSION: No acute intracranial abnormality seen.  Severe multilevel degenerative disc disease. No acute abnormality seen in the cervical spine.  Electronically Signed   By: Marijo Conception M.D.   On: 02/17/2021 16:14     Assessment & Plan:    See Problem List for Assessment and Plan of chronic medical problems.    This visit occurred during the SARS-CoV-2 public health emergency.  Safety protocols were in place, including screening questions prior to the visit, additional usage of staff PPE, and extensive cleaning of exam room while observing appropriate contact time as indicated for disinfecting solutions.

## 2021-02-24 ENCOUNTER — Ambulatory Visit (INDEPENDENT_AMBULATORY_CARE_PROVIDER_SITE_OTHER): Payer: Medicare Other | Admitting: Internal Medicine

## 2021-02-24 ENCOUNTER — Other Ambulatory Visit: Payer: Self-pay

## 2021-02-24 ENCOUNTER — Encounter: Payer: Self-pay | Admitting: Internal Medicine

## 2021-02-24 DIAGNOSIS — I6523 Occlusion and stenosis of bilateral carotid arteries: Secondary | ICD-10-CM | POA: Diagnosis not present

## 2021-02-24 DIAGNOSIS — S060X0A Concussion without loss of consciousness, initial encounter: Secondary | ICD-10-CM

## 2021-02-24 DIAGNOSIS — I10 Essential (primary) hypertension: Secondary | ICD-10-CM

## 2021-02-24 DIAGNOSIS — S0990XA Unspecified injury of head, initial encounter: Secondary | ICD-10-CM | POA: Diagnosis not present

## 2021-02-24 DIAGNOSIS — S060XAA Concussion with loss of consciousness status unknown, initial encounter: Secondary | ICD-10-CM | POA: Insufficient documentation

## 2021-02-24 DIAGNOSIS — S060X9A Concussion with loss of consciousness of unspecified duration, initial encounter: Secondary | ICD-10-CM | POA: Insufficient documentation

## 2021-02-24 NOTE — Assessment & Plan Note (Signed)
Acute Recent fall and hit the back of her head-no LOC Having headaches, dizziness, some fatigue, slightly less steady Overall symptoms are improving She will continue symptomatic treatment Advised to rest and not to do too much-if she starts to have increased symptoms with any of her activities advised her to go back to doing less She will call with any questions or concerns

## 2021-02-24 NOTE — Patient Instructions (Addendum)
Your symptoms should improve slowly - if they do not please let us know.   Call with any questions.    Concussion, Adult  A concussion is a brain injury from a hard, direct hit (trauma) to the head or body. This direct hit causes the brain to shake quickly back and forth inside the skull. This can damage brain cells and cause chemical changes in the brain. A concussion may also be known as a mild traumatic braininjury (TBI). Concussions are usually not life-threatening, but the effects of a concussion can be serious. If you have a concussion, you should be very careful to avoidhaving a second concussion. What are the causes? This condition is caused by: A direct hit to your head, such as: Running into another player during a game. Being hit in a fight. Hitting your head on a hard surface. Sudden movement of your body that causes your brain to move back and forth inside the skull, such as in a car crash. What are the signs or symptoms? The signs of a concussion can be hard to notice. Early on, they may be missed by you, family members, and health care providers. You may look fine on theoutside but may act or feel differently. Every head injury is different. Symptoms are usually temporary but may last for days, weeks, or even months. Some symptoms appear right away, but other symptoms may not show up for hours or days. If your symptoms last longer thannormal, you may have post-concussion syndrome. Physical symptoms Headaches. Dizziness and problems with coordination or balance. Sensitivity to light or noise. Nausea or vomiting. Tiredness (fatigue). Vision or hearing problems. Changes in eating or sleeping patterns. Seizure. Mental and emotional symptoms Irritability or mood changes. Memory problems. Trouble concentrating, organizing, or making decisions. Slowness in thinking, acting or reacting, speaking, or reading. Anxiety or depression. How is this diagnosed? This condition is  diagnosed based on: Your symptoms. A description of your injury. You may also have tests, including: Imaging tests, such as a CT scan or an MRI. Neuropsychological tests. These measure your thinking, understanding, learning, and remembering abilities. How is this treated? Treatment for this condition includes: Stopping sports or activity if you are injured. If you hit your head or show signs of concussion: Do not return to sports or activities the same day. Get checked by a health care provider before you return to your activities. Physical and mental rest and careful observation, usually at home. Gradually return to your normal activities. Medicines to help with symptoms such as headaches, nausea, or difficulty sleeping. Avoid taking opioid pain medicine while recovering from a concussion. Avoiding alcohol and drugs. These may slow your recovery and can put you at risk of further injury. Referral to a concussion clinic or rehabilitation center. Recovery from a concussion can take time. How fast you recover depends on many factors. Return to activities only when: Your symptoms are completely gone. Your health care provider says that it is safe. Follow these instructions at home: Activity Limit activities that require a lot of thought or concentration, such as: Doing homework or job-related work. Watching TV. Working on the computer or phone. Playing memory games and puzzles. Rest. Rest helps your brain heal. Make sure you: Get plenty of sleep. Most adults should get 7-9 hours of sleep each night. Rest during the day. Take naps or rest breaks when you feel tired. Avoid physical activity like exercise until your health care provider says it is safe. Stop any activity that worsens  symptoms. Do not do high-risk activities that could cause a second concussion, such as riding a bike or playing sports. Ask your health care provider when you can return to your normal activities, such as  school, work, athletics, and driving. Your ability to react may be slower after a brain injury. Never do these activities if you are dizzy. Your health care provider will likely give you a plan for gradually returning to activities. General instructions  Take over-the-counter and prescription medicines only as told by your health care provider. Some medicines, such as blood thinners (anticoagulants) and aspirin, may increase the risk for complications, such as bleeding. Do not drink alcohol until your health care provider says you can. Watch your symptoms and tell others around you to do the same. Complications sometimes occur after a concussion. Older adults with a brain injury may have a higher risk of serious complications. Tell your work Freight forwarder, teachers, Government social research officer, school counselor, coach, or Product/process development scientist about your injury, symptoms, and restrictions. Keep all follow-up visits as told by your health care provider. This is important.  How is this prevented? Avoiding another brain injury is very important. In rare cases, another injury can lead to permanent brain damage, brain swelling, or death. The risk of this is greatest during the first 7-10 days after a head injury. Avoid injuries by: Stopping activities that could lead to a second concussion, such as contact or recreational sports, until your health care provider says it is okay. Taking these actions once you have returned to sports or activities: Avoiding plays or moves that can cause you to crash into another person. This is how most concussions occur. Following the rules and being respectful of other players. Do not engage in violent or illegal plays. Getting regular exercise that includes strength and balance training. Wearing a properly fitting helmet during sports, biking, or other activities. Helmets can help protect you from serious skull and brain injuries, but they may not protect you from a concussion. Even when wearing a  helmet, you should avoid being hit in the head. Contact a health care provider if: Your symptoms do not improve. You have new symptoms. You have another injury. Get help right away if: You have new or worsening physical symptoms, such as: A severe or worsening headache. Weakness or numbness in any part of your body, slurred speech, vision changes, or confusion. Your coordination gets worse. Vomiting repeatedly. You have a seizure. You have unusual behavior changes. You lose consciousness, are sleepier than normal, or are difficult to wake up. These symptoms may represent a serious problem that is an emergency. Do not wait to see if the symptoms will go away. Get medical help right away. Call your local emergency services (911 in the [place] Do not drive yourself to the hospital. Summary A concussion is a brain injury that results from a hard, direct hit (trauma) to your head or body. You may have imaging tests and neuropsychological tests to diagnose a concussion. Treatment for this condition includes physical and mental rest and careful observation. Ask your health care provider when you can return to your normal activities, such as school, work, athletics, and driving. Get help right away if you have a severe headache, weakness in any part of the body, seizures, behavior changes, changes in vision, or if you are confused or sleepier than normal. This information is not intended to replace advice given to you by your health care provider. Make sure you discuss any questions you have  with your healthcare provider. Document Revised: 05/21/2019 Document Reviewed: 05/21/2019 Elsevier Patient Education  [place].

## 2021-02-24 NOTE — Assessment & Plan Note (Signed)
Acute Head injury without LOC She did suffer a concussion Concussion symptoms slowly improving Headache improving Hematoma improving Continue symptomatic treatment

## 2021-02-24 NOTE — Assessment & Plan Note (Signed)
Chronic BP well controlled Continue benazepril 20 mg daily

## 2021-03-01 ENCOUNTER — Other Ambulatory Visit: Payer: Self-pay

## 2021-03-01 ENCOUNTER — Ambulatory Visit (INDEPENDENT_AMBULATORY_CARE_PROVIDER_SITE_OTHER): Payer: Medicare Other

## 2021-03-01 VITALS — BP 118/70 | HR 80 | Temp 97.7°F | Resp 16 | Ht 66.0 in | Wt 163.2 lb

## 2021-03-01 DIAGNOSIS — Z0000 Encounter for general adult medical examination without abnormal findings: Secondary | ICD-10-CM

## 2021-03-01 NOTE — Progress Notes (Signed)
Subjective:   [name] is a 80 y.o. female who presents for Medicare Annual (Subsequent) preventive examination.  Review of Systems     Cardiac Risk Factors include: advanced age (>13mn, >>75women);diabetes mellitus;hypertension;dyslipidemia;family history of premature cardiovascular disease     Objective:    Today's Vitals   03/01/21 0830  BP: 118/70  Pulse: 80  Resp: 16  Temp: 97.7 F (36.5 C)  SpO2: 96%  Weight: 163 lb 3.2 oz (74 kg)  Height: 5' 6"  (1.676 m)  PainSc: 0-No pain   Body mass index is 26.34 kg/m.  Advanced Directives 03/01/2021 02/17/2021 08/19/2019 03/25/2018 03/19/2017 01/18/2016 02/28/2015  Does Patient Have a Medical Advance Directive? Yes Yes Yes Yes Yes No Yes  Type of Advance Directive Living will;Healthcare Power of [place]um[place]allLiving will HPaintLiving will HHoliday LakeLiving will HMunroe FallsLiving will - Living will;Healthcare Power of ATanglewildeAdvance instruction for mental health treatment  Does patient want to make changes to medical advance directive? No - Patient declined - - - - - No - Patient declined  Copy of [place]in Chart? No - copy requested - - No - copy requested No - copy requested - No - copy requested  Would patient like information on creating a medical advance directive? - - - - - No - patient declined information -    Current Medications (verified) Outpatient Encounter Medications as of 03/01/2021  Medication Sig   aspirin 81 MG tablet Take 81 mg by mouth at bedtime.    benazepril (LOTENSIN) 20 MG tablet TAKE 1 TABLET DAILY   blood glucose meter kit and supplies KIT Dispense based on patient and insurance preference. Use up to four times daily as directed. (FOR E11.9).   calcium citrate-vitamin D 500-400 MG-UNIT chewable tablet Chew 1 tablet by mouth at bedtime.    cephALEXin (KEFLEX) 250 MG capsule Take 250 mg by mouth daily.    Cholecalciferol (VITAMIN D3) 1000 UNITS CAPS Take 1 capsule by mouth daily.   CRANBERRY PO Take 300 mg by mouth daily.   cycloSPORINE (RESTASIS) 0.05 % ophthalmic emulsion Place 1 drop into both eyes 2 (two) times daily.   diazepam (VALIUM) 5 MG tablet TAKE 1 TABLET EVERY 12 HOURS AS NEEDED FOR ANXIETY   Dulaglutide (TRULICITY) 3 MIR/4.8NISOPN Inject 3 mg as directed once a week.   escitalopram (LEXAPRO) 10 MG tablet TAKE 1 TABLET DAILY   estradiol (ESTRACE) 0.1 MG/GM vaginal cream SOEVOJJKK:9.3Applicator Vaginal 3 Times a Week   levothyroxine (SYNTHROID) 50 MCG tablet Take 1 tablet (50 mcg total) by mouth daily.   metFORMIN (GLUCOPHAGE-XR) 500 MG 24 hr tablet TAKE 2 TABLETS WITH BREAKFAST AND 1 TABLET WITH DINNER   methenamine (HIPREX) 1 g tablet Take 1 g by mouth 2 (two) times daily.   methylcellulose (ARTIFICIAL TEARS) 1 % ophthalmic solution Place 1 drop into both eyes daily as needed (itching eyes).    Multiple Vitamins-Calcium (ONE-A-DAY WOMENS FORMULA) TABS Take 1 tablet by mouth daily.   nitrofurantoin, macrocrystal-monohydrate, (MACROBID) 100 MG capsule Take 100 mg by mouth 2 (two) times daily.   pravastatin (PRAVACHOL) 40 MG tablet TAKE 1 TABLET AT BEDTIME   No facility-administered encounter medications on file as of 03/01/2021.    Allergies (verified) Conjugated estrogens, Percocet [oxycodone-acetaminophen], and Detrol [tolterodine]   History: Past Medical History:  Diagnosis Date   Anxiety    Arthritis HANDS, NECK , BACK   Asymptomatic carotid  artery stenosis LEFT --  MOD. PER DR WILLIS NOTE OCT 2012   Benign heart murmur    Depression    PMH of   Diabetes mellitus ORAL MED   DVT (deep venous thrombosis) ([place]) 2006   after arthroscopy   Frequency of urination    History of CVA (cerebrovascular accident) NEUROLOGIST  DR WILLIS----  06-01-2010-  LEFT BASAL GANGLIA HEMORRAGE   RESIDUAL RIGHT HAND NUMBNESS/TINGLING   Hyperlipidemia    Hypertension    Hypothyroid    MRSA  (methicillin resistant staph aureus) culture positive    X3; last post TKR   Nocturia    Numbness and tingling in right hand RESIDUAL FROM CVA NOV 2011   Obesity    Redux therapy   Osteoporosis    Thyroid disease    right thyroid nodule-- BX DONE 08-15-2011   Past Surgical History:  Procedure Laterality Date   ABDOMINAL HYSTERECTOMY  1973   Endometriosis & fibroid   CARPAL TUNNEL RELEASE  2000   RIGHT   COLONOSCOPY  1999 & 2005   Dr Olevia Perches   cystoscope     to evaluate recurrent UTIs   CYSTOSCOPY W/ RETROGRADES  08/22/2011   Procedure: CYSTOSCOPY WITH RETROGRADE PYELOGRAM;  Surgeon: Molli Hazard, MD;  Location[facility];  Service: Urology;  Laterality: Bilateral;   CYSTOSCOPY WITH BIOPSY  08/22/2011   Procedure: CYSTOSCOPY WITH BIOPSY;  Surgeon: Molli Hazard, MD;  [facility];  Service: Urology;  Laterality: N/A;   g2 p2     JOINT REPLACEMENT  03-28-2006   LEFT THUMB   KNEE ARTHROSCOPY     BILATERAL PRIOR TO TOTAL KNEE   LEFT SHOULDER SURG  1990'S   LUMBAR LAMINECTOMY  08-26-2009   L 4 - 5   THYROID NODULE BX  08-15-2011   RIGHT   TOTAL KNEE ARTHROPLASTY  05-26-2002      LEFT   AND RIGHT  03-30-2098   WRIST SURGERY  2014   tendon placed in joint; Dr Burney Gauze   Family History  Problem Relation Age of Onset   Bladder Cancer Mother    Diabetes Mother    Brain cancer Sister    Stroke Paternal Grandfather        in 10s   Diabetes Sister 66       TYPE 1    Prostate cancer Brother    Alzheimer's disease Brother    Diabetes Maternal Aunt    Diabetes Maternal Uncle    Diabetes Maternal Grandmother        Type 2   Diabetes Maternal Grandfather        Type 2   Pancreatic cancer Sister    Kidney failure Sister        in context of DM & influenza   Heart disease Neg Hx    Social History   Socioeconomic History   Marital status: Widowed    Spouse name: Not on file   Number of children: 2   Years of  education: Not on file   Highest education level: Not on file  Occupational History   Not on file  Tobacco Use   Smoking status: Never   Smokeless tobacco: Never  Vaping Use   Vaping Use: Never used  Substance and Sexual Activity   Alcohol use: Yes    Comment:  very rarely , < 1 glass wine / month   Drug use: No   Sexual activity: Not Currently  Other  Topics Concern   Not on file  Social History Narrative   Not on file   Social Determinants of Health   Financial Resource Strain: Low Risk    Difficulty of Paying Living Expenses: Not hard at all  Food Insecurity: No Food Insecurity   Worried About Charity fundraiser in the Last Year: Never true   [place] in the Last Year: Never true  Transportation Needs: No Transportation Needs   Lack of Transportation (Medical): No   Lack of Transportation (Non-Medical): No  Physical Activity: Sufficiently Active   Days of Exercise per Week: 3 days   Minutes of Exercise per Session: 60 min  Stress: No Stress Concern Present   Feeling of Stress : Not at all  Social Connections: Moderately Integrated   Frequency of Communication with Friends and Family: More than three times a week   Frequency of Social Gatherings with Friends and Family: More than three times a week   Attends Religious Services: More than 4 times per year   Active Member of [employer] or Organizations: Yes   Attends Archivist Meetings: More than 4 times per year   Marital Status: Widowed    Tobacco Counseling Counseling given: Not Answered   Clinical Intake:  Pre-visit preparation completed: Yes  Pain : No/denies pain Pain Score: 0-No pain     BMI - recorded: 26.34 Nutritional Status: BMI 25 -29 Overweight Nutritional Risks: None Diabetes: Yes CBG done?: Yes (154 fasting) CBG resulted in Enter/ Edit results?: Yes Did pt. bring in CBG monitor from home?: No  How often do you need to have someone help you when you read instructions,  pamphlets, or other written materials from your doctor or pharmacy?: 1 - Never What is the last grade level you completed in school?: High School Graduate; [employer]  Diabetic? yes  Interpreter Needed?: No  Information entered by :: Lisette Abu, LPN   Activities of Daily Living In your present state of health, do you have any difficulty performing the following activities: 03/01/2021 09/15/2020  Hearing? N N  Vision? N N  Difficulty concentrating or making decisions? N N  Walking or climbing stairs? N N  Dressing or bathing? N N  Doing errands, shopping? N N  Preparing Food and eating ? N -  Using the Toilet? N -  In the past six months, have you accidently leaked urine? N -  Do you have problems with loss of bowel control? N -  Managing your Medications? N -  Managing your Finances? N -  Housekeeping or managing your Housekeeping? N -  Some recent data might be hidden    Patient Care Team: Binnie Rail, MD as PCP - General (Internal Medicine) Sherlynn Stalls, MD as Consulting Physician (Ophthalmology)  Indicate any recent Medical Services you may have received from other than [facility] providers in the past year (date may be approximate).     Assessment:   This is a routine wellness examination for Jakya.  Hearing/Vision screen Hearing Screening - Comments:: Patient denied any hearing issues. Vision Screening - Comments:: Patient is being see by retinal specialist every 6 months by Dr. Sherlynn Stalls.  Dietary issues and exercise activities discussed: Current Exercise Habits: Home exercise routine, Type of exercise: Other - see comments (water aeorbics 3 times a week), Time (Minutes): 60, Frequency (Times/Week): 3, Weekly Exercise (Minutes/Week): 180, Intensity: Moderate, Exercise limited by: None identified   Goals Addressed  This Visit's Progress     Patient Stated (pt-stated)        My goal is not to fall anymore.      Depression  Screen PHQ 2/9 Scores 03/01/2021 09/15/2020 03/10/2019 03/25/2018 03/19/2017 03/28/2016 04/12/2015  PHQ - 2 Score 0 0 0 1 1 0 0  PHQ- 9 Score - - - 3 3 - -    Fall Risk Fall Risk  03/01/2021 03/09/2020 03/10/2019 03/25/2018 03/19/2017  Falls in the past year? 1 0 0 No No  Comment - - - - -  Number falls in past yr: 0 0 0 - -  Comment - - - - -  Injury with Fall? 1 0 - - -  Risk for fall due to : - No Fall Risks - - -  Risk for fall due to: Comment - - - - -  Follow up Falls evaluation completed Falls evaluation completed - - -    FALL RISK PREVENTION PERTAINING TO THE HOME:  Any stairs in or around the home? No  If so, are there any without handrails? No  Home free of loose throw rugs in walkways, pet beds, electrical cords, etc? Yes  Adequate lighting in your home to reduce risk of falls? Yes   ASSISTIVE DEVICES UTILIZED TO PREVENT FALLS:  Life alert? No  Use of a cane, walker or w/c? No  Grab bars in the bathroom? Yes  Shower chair or bench in shower? Yes  Elevated toilet seat or a handicapped toilet? Yes   TIMED UP AND GO:  Was the test performed? Yes .  Length of time to ambulate 10 feet: 6 sec.   Gait steady and fast without use of assistive device  Cognitive Function: Normal cognitive status assessed by direct observation by this Nurse Health Advisor. No abnormalities found.   MMSE - Mini Mental State Exam 03/25/2018 03/19/2017 01/12/2015  Not completed: - - Unable to complete  Orientation to time 5 5 -  Orientation to Place 5 5 -  Registration 3 3 -  Attention/ Calculation 5 5 -  Recall 3 2 -  Language- name 2 objects 2 2 -  Language- repeat 1 1 -  Language- follow 3 step command 3 3 -  Language- read & follow direction 1 1 -  Write a sentence 1 1 -  Copy design 1 1 -  Total score 30 29 -        Immunizations Immunization History  Administered Date(s) Administered   Influenza Whole 07/24/1999, 06/13/2005, 06/17/2007, 04/14/2008, 04/11/2010   Influenza, High Dose  Seasonal PF 03/28/2016, 03/19/2017, 03/25/2018   Influenza, Seasonal, Injecte, Preservative Fre 06/15/2014   Influenza-Unspecified 04/22/2013, 05/02/2015   PFIZER(Purple Top)SARS-COV-2 Vaccination 08/11/2019, 09/01/2019, 04/28/2020   Pneumococcal Conjugate-13 01/12/2015   Pneumococcal Polysaccharide-23 08/03/2004   Td 08/05/1996, 09/29/2010   Zoster Recombinat (Shingrix) 01/06/2018, 03/22/2018   Zoster, Live 12/21/2010    TDAP status: Due, Education has been provided regarding the importance of this vaccine. Advised may receive this vaccine at local pharmacy or Health Dept. Aware to provide a copy of the vaccination record if obtained from local pharmacy or Health Dept. Verbalized acceptance and understanding.  Flu Vaccine status: Up to date  Pneumococcal vaccine status: Up to date  Covid-19 vaccine status: Completed vaccines  Qualifies for Shingles Vaccine? Yes   Zostavax completed Yes   Shingrix Completed?: Yes  Screening Tests Health Maintenance  Topic Date Due   COVID-19 Vaccine (4 - Booster for [place] series) 08/29/2020  TETANUS/TDAP  09/28/2020   INFLUENZA VACCINE  02/20/2021   FOOT EXAM  03/09/2021   OPHTHALMOLOGY EXAM  03/11/2021   HEMOGLOBIN A1C  03/15/2021   DEXA SCAN  09/15/2024   PNA vac Low Risk Adult  Completed   Zoster Vaccines- Shingrix  Completed   HPV VACCINES  Aged Out    Health Maintenance  Health Maintenance Due  Topic Date Due   COVID-19 Vaccine (4 - Booster for Pfizer series) 08/29/2020   TETANUS/TDAP  09/28/2020   INFLUENZA VACCINE  02/20/2021    Colorectal cancer screening: No longer required.   Mammogram status: Completed 03/16/2020. Repeat every year  Bone Density status: Completed 09/16/2019. Results reflect: Bone density results: NORMAL. Repeat every 5 years.  Lung Cancer Screening: (Low Dose CT Chest recommended if Age 50-80 years, 30 pack-year currently smoking OR have quit w/in 15years.) does not qualify.   Lung Cancer Screening  Referral: no  Additional Screening:  Hepatitis C Screening: does not qualify; Completed no  Vision Screening: Recommended annual ophthalmology exams for early detection of glaucoma and other disorders of the eye. Is the patient up to date with their annual eye exam?  Yes  Who is the provider or what is the name of the office in which the patient attends annual eye exams? Sherlynn Stalls, MD. If pt is not established with a provider, would they like to be referred to a provider to establish care? No .   Dental Screening: Recommended annual dental exams for proper oral hygiene  Community Resource Referral / Chronic Care Management: CRR required this visit?  No   CCM required this visit?  No      Plan:     I have personally reviewed and noted the following in the patient's chart:   Medical and social history Use of alcohol, tobacco or illicit drugs  Current medications and supplements including opioid prescriptions.  Functional ability and status Nutritional status Physical activity Advanced directives List of other physicians Hospitalizations, surgeries, and ER visits in previous 12 months Vitals Screenings to include cognitive, depression, and falls Referrals and appointments  In addition, I have reviewed and discussed with patient certain preventive protocols, quality metrics, and best practice recommendations. A written personalized care plan for preventive services as well as general preventive health recommendations were provided to patient.     Sheral Flow, LPN   2/41/1464   Nurse Notes: n/a

## 2021-03-01 NOTE — Patient Instructions (Signed)
[name] , Thank you for taking time to come for your Medicare Wellness Visit. I appreciate your ongoing commitment to your health goals. Please review the following plan we discussed and let me know if I can assist you in the future.   Screening recommendations/referrals: Colonoscopy: last done 11/13/2016; no repeat needed due to age Mammogram: last done 03/16/2020 Bone Density: last done 09/16/2019; due every 5 years Recommended yearly ophthalmology/optometry visit for glaucoma screening and checkup Recommended yearly dental visit for hygiene and checkup  Vaccinations: Influenza vaccine: 04/26/2020 Pneumococcal vaccine: 08/03/2004, 01/12/2015 Tdap vaccine: 09/29/2010; due every 10 years (OVERDUE) Shingles vaccine: 01/06/2018, 03/22/2018   Covid-19: 08/11/2019, 09/01/2019, 04/28/2020  Advanced directives: Please bring a copy of your health care power of attorney and living will to the office at your convenience.  Conditions/risks identified: Yes, Client understands the importance of follow-up with providers by attending scheduled visits and discussed goals to eat healthier, increase physical activity, exercise the brain, socialize more, get enough sleep and make time for laughter.  Next appointment: Please schedule your next Medicare Wellness Visit with your Nurse Health Advisor in 1 year by calling [phone].   Preventive Care 22 Years and Older, Female Preventive care refers to lifestyle choices and visits with your health care provider that can promote health and wellness. What does preventive care include? A yearly physical exam. This is also called an annual well check. Dental exams once or twice a year. Routine eye exams. Ask your health care provider how often you should have your eyes checked. Personal lifestyle choices, including: Daily care of your teeth and gums. Regular physical activity. Eating a healthy diet. Avoiding tobacco and drug use. Limiting alcohol use. Practicing safe  sex. Taking low-dose aspirin every day. Taking vitamin and mineral supplements as recommended by your health care provider. What happens during an annual well check? The services and screenings done by your health care provider during your annual well check will depend on your age, overall health, lifestyle risk factors, and family history of disease. Counseling  Your health care provider may ask you questions about your: Alcohol use. Tobacco use. Drug use. Emotional well-being. Home and relationship well-being. Sexual activity. Eating habits. History of falls. Memory and ability to understand (cognition). Work and work Statistician. Reproductive health. Screening  You may have the following tests or measurements: Height, weight, and BMI. Blood pressure. Lipid and cholesterol levels. These may be checked every 5 years, or more frequently if you are over 31 years old. Skin check. Lung cancer screening. You may have this screening every year starting at age 76 if you have a 30-pack-year history of smoking and currently smoke or have quit within the past 15 years. Fecal occult blood test (FOBT) of the stool. You may have this test every year starting at age 22. Flexible sigmoidoscopy or colonoscopy. You may have a sigmoidoscopy every 5 years or a colonoscopy every 10 years starting at age 39. Hepatitis C blood test. Hepatitis B blood test. Sexually transmitted disease (STD) testing. Diabetes screening. This is done by checking your blood sugar (glucose) after you have not eaten for a while (fasting). You may have this done every 1-3 years. Bone density scan. This is done to screen for osteoporosis. You may have this done starting at age 23. Mammogram. This may be done every 1-2 years. Talk to your health care provider about how often you should have regular mammograms. Talk with your health care provider about your test results, treatment options, and if  necessary, the need for more  tests. Vaccines  Your health care provider may recommend certain vaccines, such as: Influenza vaccine. This is recommended every year. Tetanus, diphtheria, and acellular pertussis (Tdap, Td) vaccine. You may need a Td booster every 10 years. Zoster vaccine. You may need this after age 27. Pneumococcal 13-valent conjugate (PCV13) vaccine. One dose is recommended after age 68. Pneumococcal polysaccharide (PPSV23) vaccine. One dose is recommended after age 47. Talk to your health care provider about which screenings and vaccines you need and how often you need them. This information is not intended to replace advice given to you by your health care provider. Make sure you discuss any questions you have with your health care provider. Document Released: 08/05/2015 Document Revised: 03/28/2016 Document Reviewed: 05/10/2015 Elsevier Interactive Patient Education  2017 [place] Prevention in the Home Falls can cause injuries. They can happen to people of all ages. There are many things you can do to make your home safe and to help prevent falls. What can I do on the outside of my home? Regularly fix the edges of walkways and driveways and fix any cracks. Remove anything that might make you trip as you walk through a door, such as a raised step or threshold. Trim any bushes or trees on the path to your home. Use bright outdoor lighting. Clear any walking paths of anything that might make someone trip, such as rocks or tools. Regularly check to see if handrails are loose or broken. Make sure that both sides of any steps have handrails. Any raised decks and porches should have guardrails on the edges. Have any leaves, snow, or ice cleared regularly. Use sand or salt on walking paths during winter. Clean up any spills in your garage right away. This includes oil or grease spills. What can I do in the bathroom? Use night lights. Install grab bars by the toilet and in the tub and shower.  Do not use towel bars as grab bars. Use non-skid mats or decals in the tub or shower. If you need to sit down in the shower, use a plastic, non-slip stool. Keep the floor dry. Clean up any water that spills on the floor as soon as it happens. Remove soap buildup in the tub or shower regularly. Attach bath mats securely with double-sided non-slip rug tape. Do not have throw rugs and other things on the floor that can make you trip. What can I do in the bedroom? Use night lights. Make sure that you have a light by your bed that is easy to reach. Do not use any sheets or blankets that are too big for your bed. They should not hang down onto the floor. Have a firm chair that has side arms. You can use this for support while you get dressed. Do not have throw rugs and other things on the floor that can make you trip. What can I do in the kitchen? Clean up any spills right away. Avoid walking on wet floors. Keep items that you use a lot in easy-to-reach places. If you need to reach something above you, use a strong step stool that has a grab bar. Keep electrical cords out of the way. Do not use floor polish or wax that makes floors slippery. If you must use wax, use non-skid floor wax. Do not have throw rugs and other things on the floor that can make you trip. What can I do with my stairs? Do not leave any items  on the stairs. Make sure that there are handrails on both sides of the stairs and use them. Fix handrails that are broken or loose. Make sure that handrails are as long as the stairways. Check any carpeting to make sure that it is firmly attached to the stairs. Fix any carpet that is loose or worn. Avoid having throw rugs at the top or bottom of the stairs. If you do have throw rugs, attach them to the floor with carpet tape. Make sure that you have a light switch at the top of the stairs and the bottom of the stairs. If you do not have them, ask someone to add them for you. What else  can I do to help prevent falls? Wear shoes that: Do not have high heels. Have rubber bottoms. Are comfortable and fit you well. Are closed at the toe. Do not wear sandals. If you use a stepladder: Make sure that it is fully opened. Do not climb a closed stepladder. Make sure that both sides of the stepladder are locked into place. Ask someone to hold it for you, if possible. Clearly mark and make sure that you can see: Any grab bars or handrails. First and last steps. Where the edge of each step is. Use tools that help you move around (mobility aids) if they are needed. These include: Canes. Walkers. Scooters. Crutches. Turn on the lights when you go into a dark area. Replace any light bulbs as soon as they burn out. Set up your furniture so you have a clear path. Avoid moving your furniture around. If any of your floors are uneven, fix them. If there are any pets around you, be aware of where they are. Review your medicines with your doctor. Some medicines can make you feel dizzy. This can increase your chance of falling. Ask your doctor what other things that you can do to help prevent falls. This information is not intended to replace advice given to you by your health care provider. Make sure you discuss any questions you have with your health care provider. Document Released: 05/05/2009 Document Revised: 12/15/2015 Document Reviewed: 08/13/2014 Elsevier Interactive Patient Education  2017 [employer].

## 2021-03-14 DIAGNOSIS — H35341 Macular cyst, hole, or pseudohole, right eye: Secondary | ICD-10-CM | POA: Diagnosis not present

## 2021-03-15 NOTE — Patient Instructions (Addendum)
    Blood work was ordered.      Medications changes include :   none     Please followup in 6 months  

## 2021-03-15 NOTE — Progress Notes (Signed)
Subjective:    Patient ID: [name], female    DOB: 1940-12-14, 80 y.o.   MRN: [id number]  HPI The patient is here for follow up of their chronic medical problems, including DM, htn, hypothyroidism, hichol, anxiety  She does water aerobics 3/week.  She did have COVID-like symptoms and despite having a negative test she think she may have had it.  She still has some residual fatigue.  Medications and allergies reviewed with patient and updated if appropriate.  Patient Active Problem List   Diagnosis Date Noted   Concussion 02/24/2021   Head injury due to trauma 02/23/2021   Acute bronchitis 10/24/2020   Cholelithiasis 03/09/2020   Low back pain 10/29/2019   Pain in left wrist 10/01/2019   Cervicalgia 07/29/2019   Trigger finger, left middle finger 04/28/2018   Hypothyroidism 03/29/2017   Generalized anxiety disorder 06/20/2015   SUI (stress urinary incontinence, female) 02/05/2014   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   Cerebral artery occlusion with cerebral infarction ([place]) 05/31/2010   DM (diabetes mellitus), secondary, uncontrolled, with renal complications ([place]) 78/67/6720   THYROID NODULE, RIGHT 01/21/2009   Vitamin D deficiency 12/10/2007   HYPERLIPIDEMIA 06/17/2007   Essential hypertension 06/17/2007   Osteopenia 06/17/2007   Osteoarthrosis, unspecified whether generalized or localized, unspecified site 11/11/2006    Current Outpatient Medications on File Prior to Visit  Medication Sig Dispense Refill   amoxicillin (AMOXIL) 500 MG tablet Take 500 mg by mouth 3 (three) times daily.     aspirin 81 MG tablet Take 81 mg by mouth at bedtime.      benazepril (LOTENSIN) 20 MG tablet TAKE 1 TABLET DAILY 90 tablet 3   blood glucose meter kit and supplies KIT Dispense based on patient and insurance preference. Use up to four times daily as directed. (FOR E11.9). 1 each 0   calcium citrate-vitamin D 500-400 MG-UNIT chewable tablet Chew 1 tablet by mouth at bedtime.       cephALEXin (KEFLEX) 250 MG capsule Take 250 mg by mouth daily.     Cholecalciferol (VITAMIN D3) 1000 UNITS CAPS Take 1 capsule by mouth daily.     CRANBERRY PO Take 300 mg by mouth daily.     cycloSPORINE (RESTASIS) 0.05 % ophthalmic emulsion Place 1 drop into both eyes 2 (two) times daily.     diazepam (VALIUM) 5 MG tablet TAKE 1 TABLET EVERY 12 HOURS AS NEEDED FOR ANXIETY 180 tablet 0   Dulaglutide (TRULICITY) 3 NO/7.0JG SOPN Inject 3 mg as directed once a week. 6 mL 2   escitalopram (LEXAPRO) 10 MG tablet TAKE 1 TABLET DAILY 90 tablet 3   estradiol (ESTRACE) 0.1 MG/GM vaginal cream GEZMOQHU:7.6 Applicator Vaginal 3 Times a Week     levothyroxine (SYNTHROID) 50 MCG tablet Take 1 tablet (50 mcg total) by mouth daily. 90 tablet 3   metFORMIN (GLUCOPHAGE-XR) 500 MG 24 hr tablet TAKE 2 TABLETS WITH BREAKFAST AND 1 TABLET WITH DINNER 270 tablet 3   methylcellulose (ARTIFICIAL TEARS) 1 % ophthalmic solution Place 1 drop into both eyes daily as needed (itching eyes).      Multiple Vitamins-Calcium (ONE-A-DAY WOMENS FORMULA) TABS Take 1 tablet by mouth daily.     nitrofurantoin, macrocrystal-monohydrate, (MACROBID) 100 MG capsule Take 100 mg by mouth 2 (two) times daily.     pravastatin (PRAVACHOL) 40 MG tablet TAKE 1 TABLET AT BEDTIME 90 tablet 3   No current facility-administered medications on file prior to visit.    Past Medical History:  Diagnosis Date   Anxiety    Arthritis HANDS, NECK , BACK   Asymptomatic carotid artery stenosis LEFT --  MOD. PER DR WILLIS NOTE OCT 2012   Benign heart murmur    Depression    PMH of   Diabetes mellitus ORAL MED   DVT (deep venous thrombosis) ([place]) 2006   after arthroscopy   Frequency of urination    History of CVA (cerebrovascular accident) NEUROLOGIST  DR WILLIS----  06-01-2010-  LEFT BASAL GANGLIA HEMORRAGE   RESIDUAL RIGHT HAND NUMBNESS/TINGLING   Hyperlipidemia    Hypertension    Hypothyroid    MRSA (methicillin resistant staph aureus)  culture positive    X3; last post TKR   Nocturia    Numbness and tingling in right hand RESIDUAL FROM CVA NOV 2011   Obesity    Redux therapy   Osteoporosis    Thyroid disease    right thyroid nodule-- BX DONE 08-15-2011    Past Surgical History:  Procedure Laterality Date   ABDOMINAL HYSTERECTOMY  1973   Endometriosis & fibroid   CARPAL TUNNEL RELEASE  2000   RIGHT   COLONOSCOPY  1999 & 2005   Dr Olevia Perches   cystoscope     to evaluate recurrent UTIs   CYSTOSCOPY W/ RETROGRADES  08/22/2011   Procedure: CYSTOSCOPY WITH RETROGRADE PYELOGRAM;  Surgeon: Molli Hazard, MD;  Location: [facility];  Service: Urology;  Laterality: Bilateral;   CYSTOSCOPY WITH BIOPSY  08/22/2011   Procedure: CYSTOSCOPY WITH BIOPSY;  Surgeon: Molli Hazard, MD;  Location: [facility];  Service: Urology;  Laterality: N/A;   g2 p2     JOINT REPLACEMENT  03-28-2006   LEFT THUMB   KNEE ARTHROSCOPY     BILATERAL PRIOR TO TOTAL KNEE   LEFT SHOULDER SURG  1990'S   LUMBAR LAMINECTOMY  08-26-2009   L 4 - 5   THYROID NODULE BX  08-15-2011   RIGHT   TOTAL KNEE ARTHROPLASTY  05-26-2002      LEFT   AND RIGHT  03-30-2098   WRIST SURGERY  2014   tendon placed in joint; Dr Burney Gauze    Social History   Socioeconomic History   Marital status: Widowed    Spouse name: Not on file   Number of children: 2   Years of education: Not on file   Highest education level: Not on file  Occupational History   Not on file  Tobacco Use   Smoking status: Never   Smokeless tobacco: Never  Vaping Use   Vaping Use: Never used  Substance and Sexual Activity   Alcohol use: Yes    Comment:  very rarely , < 1 glass wine / month   Drug use: No   Sexual activity: Not Currently  Other Topics Concern   Not on file  Social History Narrative   Not on file   Social Determinants of Health   Financial Resource Strain: Low Risk    Difficulty of Paying Living Expenses: Not hard at all   Food Insecurity: No Food Insecurity   Worried About Charity fundraiser in the Last Year: Never true   [place] in the Last Year: Never true  Transportation Needs: No Transportation Needs   Lack of Transportation (Medical): No   Lack of Transportation (Non-Medical): No  Physical Activity: Sufficiently Active   Days of Exercise per Week: 3 days   Minutes of Exercise per Session: 60 min  Stress: No  Stress Concern Present   Feeling of Stress : Not at all  Social Connections: Moderately Integrated   Frequency of Communication with Friends and Family: More than three times a week   Frequency of Social Gatherings with Friends and Family: More than three times a week   Attends Religious Services: More than 4 times per year   Active Member of [employer] or Organizations: Yes   Attends Archivist Meetings: More than 4 times per year   Marital Status: Widowed    Family History  Problem Relation Age of Onset   Bladder Cancer Mother    Diabetes Mother    Brain cancer Sister    Stroke Paternal Grandfather        in 27s   Diabetes Sister 47       TYPE 1    Prostate cancer Brother    Alzheimer's disease Brother    Diabetes Maternal Aunt    Diabetes Maternal Uncle    Diabetes Maternal Grandmother        Type 2   Diabetes Maternal Grandfather        Type 2   Pancreatic cancer Sister    Kidney failure Sister        in context of DM & influenza   Heart disease Neg Hx     Review of Systems  Constitutional:  Negative for chills and fever.  Respiratory:  Negative for cough, shortness of breath and wheezing.   Cardiovascular:  Negative for chest pain, palpitations and leg swelling.  Neurological:  Negative for dizziness, light-headedness and headaches.      Objective:   Vitals:   03/16/21 1118  BP: 124/80  Pulse: 76  Temp: 98.1 F (36.7 C)  SpO2: 97%   BP Readings from Last 3 Encounters:  03/16/21 124/80  03/01/21 118/70  02/24/21 120/62   Wt Readings from Last  3 Encounters:  03/16/21 163 lb (73.9 kg)  03/01/21 163 lb 3.2 oz (74 kg)  02/24/21 160 lb (72.6 kg)   Body mass index is 26.31 kg/m.   Physical Exam    Constitutional: Appears well-developed and well-nourished. No distress.  HENT:  Head: Normocephalic and atraumatic.  Neck: Neck supple. No tracheal deviation present. No thyromegaly present.  No cervical lymphadenopathy Cardiovascular: Normal rate, regular rhythm and normal heart sounds.   No murmur heard. No carotid bruit .  No edema Pulmonary/Chest: Effort normal and breath sounds normal. No respiratory distress. No has no wheezes. No rales.  Skin: Skin is warm and dry. Not diaphoretic.  Psychiatric: Normal mood and affect. Behavior is normal.   Diabetic Foot Exam - Simple   Simple Foot Form Diabetic Foot exam was performed with the following findings: Yes 03/16/2021 11:59 AM  Visual Inspection No deformities, no ulcerations, no other skin breakdown bilaterally: Yes Sensation Testing Intact to touch and monofilament testing bilaterally: Yes Pulse Check Posterior Tibialis and Dorsalis pulse intact bilaterally: Yes Comments       Assessment & Plan:    See Problem List for Assessment and Plan of chronic medical problems.    This visit occurred during the SARS-CoV-2 public health emergency.  Safety protocols were in place, including screening questions prior to the visit, additional usage of staff PPE, and extensive cleaning of exam room while observing appropriate contact time as indicated for disinfecting solutions.

## 2021-03-16 ENCOUNTER — Encounter: Payer: Self-pay | Admitting: Internal Medicine

## 2021-03-16 ENCOUNTER — Ambulatory Visit (INDEPENDENT_AMBULATORY_CARE_PROVIDER_SITE_OTHER): Payer: Medicare Other | Admitting: Internal Medicine

## 2021-03-16 ENCOUNTER — Other Ambulatory Visit: Payer: Self-pay

## 2021-03-16 VITALS — BP 124/80 | HR 76 | Temp 98.1°F | Ht 66.0 in | Wt 163.0 lb

## 2021-03-16 DIAGNOSIS — I6523 Occlusion and stenosis of bilateral carotid arteries: Secondary | ICD-10-CM | POA: Diagnosis not present

## 2021-03-16 DIAGNOSIS — E559 Vitamin D deficiency, unspecified: Secondary | ICD-10-CM | POA: Diagnosis not present

## 2021-03-16 DIAGNOSIS — E538 Deficiency of other specified B group vitamins: Secondary | ICD-10-CM | POA: Insufficient documentation

## 2021-03-16 DIAGNOSIS — F411 Generalized anxiety disorder: Secondary | ICD-10-CM | POA: Diagnosis not present

## 2021-03-16 DIAGNOSIS — E039 Hypothyroidism, unspecified: Secondary | ICD-10-CM | POA: Diagnosis not present

## 2021-03-16 DIAGNOSIS — I10 Essential (primary) hypertension: Secondary | ICD-10-CM | POA: Diagnosis not present

## 2021-03-16 DIAGNOSIS — E1329 Other specified diabetes mellitus with other diabetic kidney complication: Secondary | ICD-10-CM

## 2021-03-16 DIAGNOSIS — E1365 Other specified diabetes mellitus with hyperglycemia: Secondary | ICD-10-CM

## 2021-03-16 DIAGNOSIS — IMO0002 Reserved for concepts with insufficient information to code with codable children: Secondary | ICD-10-CM

## 2021-03-16 DIAGNOSIS — E782 Mixed hyperlipidemia: Secondary | ICD-10-CM | POA: Diagnosis not present

## 2021-03-16 DIAGNOSIS — R202 Paresthesia of skin: Secondary | ICD-10-CM | POA: Diagnosis not present

## 2021-03-16 LAB — CBC WITH DIFFERENTIAL/PLATELET
Basophils Absolute: 0 10*3/uL (ref 0.0–0.1)
Basophils Relative: 0.5 % (ref 0.0–3.0)
Eosinophils Absolute: 0.3 10*3/uL (ref 0.0–0.7)
Eosinophils Relative: 3.9 % (ref 0.0–5.0)
HCT: 39 % (ref 36.0–46.0)
Hemoglobin: 13.6 g/dL (ref 12.0–15.0)
Lymphocytes Relative: 24 % (ref 12.0–46.0)
Lymphs Abs: 1.7 10*3/uL (ref 0.7–4.0)
MCHC: 34.8 g/dL (ref 30.0–36.0)
MCV: 89.4 fl (ref 78.0–100.0)
Monocytes Absolute: 0.6 10*3/uL (ref 0.1–1.0)
Monocytes Relative: 8.1 % (ref 3.0–12.0)
Neutro Abs: 4.4 10*3/uL (ref 1.4–7.7)
Neutrophils Relative %: 63.5 % (ref 43.0–77.0)
Platelets: 239 10*3/uL (ref 150.0–400.0)
RBC: 4.36 Mil/uL (ref 3.87–5.11)
RDW: 13 % (ref 11.5–15.5)
WBC: 7 10*3/uL (ref 4.0–10.5)

## 2021-03-16 LAB — COMPREHENSIVE METABOLIC PANEL
ALT: 12 U/L (ref 0–35)
AST: 17 U/L (ref 0–37)
Albumin: 4.2 g/dL (ref 3.5–5.2)
Alkaline Phosphatase: 34 U/L — ABNORMAL LOW (ref 39–117)
BUN: 13 mg/dL (ref 6–23)
CO2: 29 mEq/L (ref 19–32)
Calcium: 9.9 mg/dL (ref 8.4–10.5)
Chloride: 102 mEq/L (ref 96–112)
Creatinine, Ser: 0.69 mg/dL (ref 0.40–1.20)
GFR: 80.73 mL/min (ref >60.00)
Glucose, Bld: 168 mg/dL — ABNORMAL HIGH (ref 70–99)
Potassium: 4 mEq/L (ref 3.5–5.1)
Sodium: 139 mEq/L (ref 135–145)
Total Bilirubin: 0.6 mg/dL (ref 0.2–1.2)
Total Protein: 6.8 g/dL (ref 6.0–8.3)

## 2021-03-16 LAB — VITAMIN D 25 HYDROXY (VIT D DEFICIENCY, FRACTURES): VITD: 60.06 ng/mL (ref 30.00–100.00)

## 2021-03-16 LAB — LIPID PANEL
Cholesterol: 119 mg/dL (ref 0–200)
HDL: 46.8 mg/dL (ref >39.00)
LDL Cholesterol: 54 mg/dL (ref 0–99)
NonHDL: 71.86
Total CHOL/HDL Ratio: 3
Triglycerides: 90 mg/dL (ref 0.0–149.0)
VLDL: 18 mg/dL (ref 0.0–40.0)

## 2021-03-16 LAB — TSH: TSH: 1.85 u[IU]/mL (ref 0.35–5.50)

## 2021-03-16 LAB — VITAMIN B12: Vitamin B-12: 229 pg/mL (ref 211–911)

## 2021-03-16 LAB — HEMOGLOBIN A1C: Hgb A1c MFr Bld: 6.2 % (ref 4.6–6.5)

## 2021-03-16 NOTE — Assessment & Plan Note (Signed)
Chronic Controlled, stable Continue valium 5 mg bid prn and lexparo 10 mg

## 2021-03-16 NOTE — Assessment & Plan Note (Signed)
Chronic Check lipid panel  Continue pravastatin 40mg  Regular exercise and healthy diet encouraged  

## 2021-03-16 NOTE — Assessment & Plan Note (Addendum)
Chronic Lab Results  Component Value Date   HGBA1C 6.2 09/15/2020   Controlled Continue metformin xr 1000 mg in am, 500 mg in pm Continue Trulicity 3 mg weekly

## 2021-03-16 NOTE — Assessment & Plan Note (Signed)
Chronic Taking vitamin D daily Check vitamin D level  

## 2021-03-16 NOTE — Assessment & Plan Note (Signed)
Has tingling mostly in her right hand Will check B12 level

## 2021-03-16 NOTE — Assessment & Plan Note (Signed)
Chronic BP well controlled Continue benazepril 20 mg qd cmp

## 2021-03-16 NOTE — Assessment & Plan Note (Signed)
Chronic  Clinically euthyroid Currently taking levothyroxine 50 mcg daily Check tsh  Titrate med dose if needed  

## 2021-03-22 DIAGNOSIS — Z1231 Encounter for screening mammogram for malignant neoplasm of breast: Secondary | ICD-10-CM | POA: Diagnosis not present

## 2021-03-22 LAB — HM MAMMOGRAPHY

## 2021-03-30 ENCOUNTER — Encounter: Payer: Self-pay | Admitting: Internal Medicine

## 2021-03-30 NOTE — Progress Notes (Signed)
Outside notes received. Information abstracted. Notes sent to scan.  

## 2021-04-03 DIAGNOSIS — E119 Type 2 diabetes mellitus without complications: Secondary | ICD-10-CM | POA: Diagnosis not present

## 2021-04-03 LAB — HM DIABETES EYE EXAM

## 2021-05-11 ENCOUNTER — Encounter: Payer: Self-pay | Admitting: Internal Medicine

## 2021-05-11 NOTE — Progress Notes (Signed)
Outside notes received. Information abstracted. Notes sent to scan.  

## 2021-05-13 ENCOUNTER — Other Ambulatory Visit: Payer: Self-pay | Admitting: Internal Medicine

## 2021-05-15 ENCOUNTER — Emergency Department (HOSPITAL_COMMUNITY)
Admission: EM | Admit: 2021-05-15 | Discharge: 2021-05-16 | Disposition: A | Discharge status: Home / Self Care | Payer: Medicare Other | Attending: Emergency Medicine | Admitting: Emergency Medicine

## 2021-05-15 ENCOUNTER — Encounter (HOSPITAL_COMMUNITY): Payer: Self-pay

## 2021-05-15 ENCOUNTER — Telehealth: Payer: Self-pay | Admitting: Internal Medicine

## 2021-05-15 DIAGNOSIS — Z96698 Presence of other orthopedic joint implants: Secondary | ICD-10-CM | POA: Insufficient documentation

## 2021-05-15 DIAGNOSIS — Z79899 Other long term (current) drug therapy: Secondary | ICD-10-CM | POA: Diagnosis not present

## 2021-05-15 DIAGNOSIS — Z7984 Long term (current) use of oral hypoglycemic drugs: Secondary | ICD-10-CM | POA: Insufficient documentation

## 2021-05-15 DIAGNOSIS — K819 Cholecystitis, unspecified: Secondary | ICD-10-CM

## 2021-05-15 DIAGNOSIS — E871 Hypo-osmolality and hyponatremia: Secondary | ICD-10-CM

## 2021-05-15 DIAGNOSIS — R1011 Right upper quadrant pain: Secondary | ICD-10-CM | POA: Diagnosis present

## 2021-05-15 DIAGNOSIS — I10 Essential (primary) hypertension: Secondary | ICD-10-CM

## 2021-05-15 DIAGNOSIS — E119 Type 2 diabetes mellitus without complications: Secondary | ICD-10-CM | POA: Diagnosis not present

## 2021-05-15 DIAGNOSIS — R109 Unspecified abdominal pain: Secondary | ICD-10-CM | POA: Diagnosis not present

## 2021-05-15 DIAGNOSIS — E039 Hypothyroidism, unspecified: Secondary | ICD-10-CM | POA: Insufficient documentation

## 2021-05-15 DIAGNOSIS — R1110 Vomiting, unspecified: Secondary | ICD-10-CM | POA: Diagnosis not present

## 2021-05-15 DIAGNOSIS — Z96653 Presence of artificial knee joint, bilateral: Secondary | ICD-10-CM | POA: Insufficient documentation

## 2021-05-15 DIAGNOSIS — Z7982 Long term (current) use of aspirin: Secondary | ICD-10-CM | POA: Diagnosis not present

## 2021-05-15 LAB — CBC WITH DIFFERENTIAL/PLATELET
Abs Immature Granulocytes: 0.07 10*3/uL (ref 0.00–0.07)
Basophils Absolute: 0 10*3/uL (ref 0.0–0.1)
Basophils Relative: 0 %
Eosinophils Absolute: 0 10*3/uL (ref 0.0–0.5)
Eosinophils Relative: 0 %
HCT: 41.1 % (ref 36.0–46.0)
Hemoglobin: 14.7 g/dL (ref 12.0–15.0)
Immature Granulocytes: 0 %
Lymphocytes Relative: 6 %
Lymphs Abs: 1 10*3/uL (ref 0.7–4.0)
MCH: 31.6 pg (ref 26.0–34.0)
MCHC: 35.8 g/dL (ref 30.0–36.0)
MCV: 88.4 fL (ref 80.0–100.0)
Monocytes Absolute: 0.5 10*3/uL (ref 0.1–1.0)
Monocytes Relative: 3 %
Neutro Abs: 14.4 10*3/uL — ABNORMAL HIGH (ref 1.7–7.7)
Neutrophils Relative %: 91 %
Platelets: 239 10*3/uL (ref 150–400)
RBC: 4.65 MIL/uL (ref 3.87–5.11)
RDW: 12.2 % (ref 11.5–15.5)
WBC: 16 10*3/uL — ABNORMAL HIGH (ref 4.0–10.5)
nRBC: 0 % (ref 0.0–0.2)

## 2021-05-15 LAB — COMPREHENSIVE METABOLIC PANEL
ALT: 16 U/L (ref 0–44)
AST: 18 U/L (ref 15–41)
Albumin: 4.7 g/dL (ref 3.5–5.0)
Alkaline Phosphatase: 39 U/L (ref 38–126)
Anion gap: 10 (ref 5–15)
BUN: 18 mg/dL (ref 8–23)
CO2: 26 mmol/L (ref 22–32)
Calcium: 10 mg/dL (ref 8.9–10.3)
Chloride: 98 mmol/L (ref 98–111)
Creatinine, Ser: 0.58 mg/dL (ref 0.44–1.00)
GFR, Estimated: >60 mL/min (ref >60)
Glucose, Bld: 231 mg/dL — ABNORMAL HIGH (ref 70–99)
Potassium: 3.7 mmol/L (ref 3.5–5.1)
Sodium: 134 mmol/L — ABNORMAL LOW (ref 135–145)
Total Bilirubin: 1 mg/dL (ref 0.3–1.2)
Total Protein: 7.5 g/dL (ref 6.5–8.1)

## 2021-05-15 LAB — LIPASE, BLOOD: Lipase: 28 U/L (ref 11–51)

## 2021-05-15 NOTE — Telephone Encounter (Signed)
Patient calling in c/o severe pain in back since 2:00 am this morning, thinks it is possible gallstone  Transferred call to team health for further triage

## 2021-05-15 NOTE — ED Triage Notes (Signed)
Pt presents with c/o abdominal pain with vomiting since 2 am this morning.

## 2021-05-16 ENCOUNTER — Emergency Department (HOSPITAL_COMMUNITY): Payer: Medicare Other

## 2021-05-16 DIAGNOSIS — R1110 Vomiting, unspecified: Secondary | ICD-10-CM | POA: Diagnosis not present

## 2021-05-16 DIAGNOSIS — K819 Cholecystitis, unspecified: Secondary | ICD-10-CM | POA: Diagnosis not present

## 2021-05-16 DIAGNOSIS — R109 Unspecified abdominal pain: Secondary | ICD-10-CM | POA: Diagnosis not present

## 2021-05-16 LAB — URINALYSIS, ROUTINE W REFLEX MICROSCOPIC
Bilirubin Urine: NEGATIVE
Glucose, UA: 50 mg/dL — AB
Hgb urine dipstick: NEGATIVE
Ketones, ur: 80 mg/dL — AB
Nitrite: NEGATIVE
Protein, ur: 30 mg/dL — AB
Specific Gravity, Urine: 1.021 (ref 1.005–1.030)
WBC, UA: >50 WBC/hpf — ABNORMAL HIGH (ref 0–5)
pH: 5 (ref 5.0–8.0)

## 2021-05-16 MED ORDER — TRAMADOL HCL 50 MG PO TABS: 50.0000 mg | ORAL_TABLET | Freq: Four times a day (QID) | ORAL | 0 refills | Status: DC | PRN

## 2021-05-16 MED ORDER — LACTATED RINGERS IV BOLUS
1000.0000 mL | Freq: Once | INTRAVENOUS | Status: AC
  Administered 2021-05-16: 1000 mL via INTRAVENOUS

## 2021-05-16 MED ORDER — MORPHINE SULFATE (PF) 4 MG/ML IV SOLN
4.0000 mg | Freq: Once | INTRAVENOUS | Status: AC
  Administered 2021-05-16: 4 mg via INTRAVENOUS
  Filled 2021-05-16: qty 1

## 2021-05-16 MED ORDER — IOHEXOL 350 MG/ML SOLN
80.0000 mL | Freq: Once | INTRAVENOUS | Status: AC | PRN
  Administered 2021-05-16: 80 mL via INTRAVENOUS

## 2021-05-16 MED ORDER — ONDANSETRON HCL 4 MG PO TABS: 4.0000 mg | ORAL_TABLET | Freq: Four times a day (QID) | ORAL | 0 refills | Status: DC | PRN

## 2021-05-16 MED ORDER — ONDANSETRON HCL 4 MG/2ML IJ SOLN
4.0000 mg | Freq: Once | INTRAMUSCULAR | Status: AC
  Administered 2021-05-16: 4 mg via INTRAVENOUS
  Filled 2021-05-16: qty 2

## 2021-05-16 NOTE — ED Notes (Signed)
Patient transported to CT 

## 2021-05-16 NOTE — ED Provider Notes (Signed)
[facility] DEPT Provider Note   CSN: [id number] Arrival date & time: 05/15/21  0945     History Chief Complaint  Patient presents with   Abdominal Pain    [name] is a 80 y.o. female.  The history is provided by the patient.  Abdominal Pain She has history of hypertension, diabetes, hyperlipidemia, stroke and comes in complaining of right upper quadrant pain which started last night.  Pain radiates to the back.  There is associated nausea and vomiting.  Pain was rated at 7/10 at its worst, has subsided slightly to now it is 5/10.  She denies fever, chills, sweats.  She is never had pain like this before, but does state that she has been told that she has a large gallstone.  She still has her gallbladder.   Past Medical History:  Diagnosis Date   Anxiety    Arthritis HANDS, NECK , BACK   Asymptomatic carotid artery stenosis LEFT --  MOD. PER DR WILLIS NOTE OCT 2012   Benign heart murmur    Depression    PMH of   Diabetes mellitus ORAL MED   DVT (deep venous thrombosis) ([place]) 2006   after arthroscopy   Frequency of urination    History of CVA (cerebrovascular accident) NEUROLOGIST  DR WILLIS----  06-01-2010-  LEFT BASAL GANGLIA HEMORRAGE   RESIDUAL RIGHT HAND NUMBNESS/TINGLING   Hyperlipidemia    Hypertension    Hypothyroid    MRSA (methicillin resistant staph aureus) culture positive    X3; last post TKR   Nocturia    Numbness and tingling in right hand RESIDUAL FROM CVA NOV 2011   Obesity    Redux therapy   Osteoporosis    Thyroid disease    right thyroid nodule-- BX DONE 08-15-2011    Patient Active Problem List   Diagnosis Date Noted   Tingling 03/16/2021   Vitamin B12 deficiency 03/16/2021   Concussion 02/24/2021   Head injury due to trauma 02/23/2021   Acute bronchitis 10/24/2020   Cholelithiasis 03/09/2020   Low back pain 10/29/2019   Pain in left wrist 10/01/2019   Cervicalgia 07/29/2019   Trigger finger, left  middle finger 04/28/2018   Hypothyroidism 03/29/2017   Generalized anxiety disorder 06/20/2015   SUI (stress urinary incontinence, female) 02/05/2014   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   Cerebral artery occlusion with cerebral infarction ([place]) 05/31/2010   DM (diabetes mellitus), secondary, uncontrolled, with renal complications ([place]) 67/59/1638   THYROID NODULE, RIGHT 01/21/2009   Vitamin D deficiency 12/10/2007   HYPERLIPIDEMIA 06/17/2007   Essential hypertension 06/17/2007   Osteopenia 06/17/2007   Osteoarthrosis, unspecified whether generalized or localized, unspecified site 11/11/2006    Past Surgical History:  Procedure Laterality Date   ABDOMINAL HYSTERECTOMY  1973   Endometriosis & fibroid   CARPAL TUNNEL RELEASE  2000   RIGHT   COLONOSCOPY  1999 & 2005   Dr Olevia Perches   cystoscope     to evaluate recurrent UTIs   CYSTOSCOPY W/ RETROGRADES  08/22/2011   Procedure: CYSTOSCOPY WITH RETROGRADE PYELOGRAM;  Surgeon: Molli Hazard, MD;  Location[facility];  Service: Urology;  Laterality: Bilateral;   CYSTOSCOPY WITH BIOPSY  08/22/2011   Procedure: CYSTOSCOPY WITH BIOPSY;  Surgeon: Molli Hazard, MD;  Location[facility];  Service: Urology;  Laterality: N/A;   g2 p2     JOINT REPLACEMENT  03-28-2006   LEFT THUMB   KNEE ARTHROSCOPY     BILATERAL  PRIOR TO TOTAL KNEE   LEFT SHOULDER SURG  1990'S   LUMBAR LAMINECTOMY  08-26-2009   L 4 - 5   THYROID NODULE BX  08-15-2011   RIGHT   TOTAL KNEE ARTHROPLASTY  05-26-2002      LEFT   AND RIGHT  03-30-2098   WRIST SURGERY  2014   tendon placed in joint; Dr Burney Gauze     OB History   No obstetric history on file.     Family History  Problem Relation Age of Onset   Bladder Cancer Mother    Diabetes Mother    Brain cancer Sister    Stroke Paternal Grandfather        in 81s   Diabetes Sister 48       TYPE 1    Prostate cancer Brother    Alzheimer's disease Brother     Diabetes Maternal Aunt    Diabetes Maternal Uncle    Diabetes Maternal Grandmother        Type 2   Diabetes Maternal Grandfather        Type 2   Pancreatic cancer Sister    Kidney failure Sister        in context of DM & influenza   Heart disease Neg Hx     Social History   Tobacco Use   Smoking status: Never   Smokeless tobacco: Never  Vaping Use   Vaping Use: Never used  Substance Use Topics   Alcohol use: Yes    Comment:  very rarely , < 1 glass wine / month   Drug use: No    Home Medications Prior to Admission medications   Medication Sig Start Date End Date Taking? Authorizing Provider  amoxicillin (AMOXIL) 500 MG tablet Take 500 mg by mouth 3 (three) times daily. 03/13/21   [provider]  aspirin 81 MG tablet Take 81 mg by mouth at bedtime.     [provider]  benazepril (LOTENSIN) 20 MG tablet TAKE 1 TABLET DAILY 05/27/20   Binnie Rail, MD  blood glucose meter kit and supplies KIT Dispense based on patient and insurance preference. Use up to four times daily as directed. (FOR E11.9). 03/09/20   Binnie Rail, MD  calcium citrate-vitamin D 500-400 MG-UNIT chewable tablet Chew 1 tablet by mouth at bedtime.     [provider]  cephALEXin (KEFLEX) 250 MG capsule Take 250 mg by mouth daily. 02/22/21   [provider]  Cholecalciferol (VITAMIN D3) 1000 UNITS CAPS Take 1 capsule by mouth daily.    [provider]  CRANBERRY PO Take 300 mg by mouth daily.    [provider]  cycloSPORINE (RESTASIS) 0.05 % ophthalmic emulsion Place 1 drop into both eyes 2 (two) times daily.    [provider]  diazepam (VALIUM) 5 MG tablet TAKE 1 TABLET EVERY 12 HOURS AS NEEDED FOR ANXIETY 05/15/21   Burns, Claudina Lick, MD  Dulaglutide (TRULICITY) 3 VF/6.4PP SOPN Inject 3 mg as directed once a week. 11/25/20   Binnie Rail, MD  escitalopram (LEXAPRO) 10 MG tablet TAKE 1 TABLET DAILY 05/15/21   Binnie Rail, MD  estradiol (ESTRACE)  0.1 MG/GM vaginal cream IRJJOACZ:6.6 Applicator Vaginal 3 Times a Week 08/26/20   [provider]  metFORMIN (GLUCOPHAGE-XR) 500 MG 24 hr tablet TAKE 2 TABLETS WITH BREAKFAST AND 1 TABLET WITH DINNER 01/24/21   Burns, Claudina Lick, MD  methylcellulose (ARTIFICIAL TEARS) 1 % ophthalmic solution Place 1 drop  into both eyes daily as needed (itching eyes).     [provider]  Multiple Vitamins-Calcium (ONE-A-DAY WOMENS FORMULA) TABS Take 1 tablet by mouth daily.    [provider]  pravastatin (PRAVACHOL) 40 MG tablet TAKE 1 TABLET AT BEDTIME 02/15/21   Binnie Rail, MD  SYNTHROID 50 MCG tablet TAKE 1 TABLET DAILY 05/15/21   Binnie Rail, MD    Allergies    Conjugated estrogens, Percocet [oxycodone-acetaminophen], and Detrol [tolterodine]  Review of Systems   Review of Systems  Gastrointestinal:  Positive for abdominal pain.  All other systems reviewed and are negative.  Physical Exam Updated Vital Signs BP (!) 194/92   Pulse 81   Temp 97.8 F (36.6 C) (Oral)   Resp 18   Ht 5' 6" (1.676 m)   Wt 71.7 kg   SpO2 97%   BMI 25.50 kg/m   Physical Exam Vitals and nursing note reviewed.  80 year old female, resting comfortably and in no acute distress. Vital signs are significant for elevated blood pressure. Oxygen saturation is 97%, which is normal. Head is normocephalic and atraumatic. PERRLA, EOMI. Oropharynx is clear. Neck is nontender and supple without adenopathy or JVD. Back is nontender and there is no CVA tenderness. Lungs are clear without rales, wheezes, or rhonchi. Chest is nontender. Heart has regular rate and rhythm without murmur. Abdomen is soft, flat, with moderate right upper quadrant tenderness and +/- Murphy sign.  There are no masses or hepatosplenomegaly and peristalsis is hypoactive. Extremities have no cyanosis or edema, full range of motion is present. Skin is warm and dry without rash. Neurologic: Mental status is normal, cranial nerves are  intact, there are no motor or sensory deficits.  ED Results / Procedures / Treatments   Labs (all labs ordered are listed, but only abnormal results are displayed) Labs Reviewed  CBC WITH DIFFERENTIAL/PLATELET - Abnormal; Notable for the following components:      Result Value   WBC 16.0 (*)    Neutro Abs 14.4 (*)    All other components within normal limits  COMPREHENSIVE METABOLIC PANEL - Abnormal; Notable for the following components:   Sodium 134 (*)    Glucose, Bld 231 (*)    All other components within normal limits  URINALYSIS, ROUTINE W REFLEX MICROSCOPIC - Abnormal; Notable for the following components:   APPearance HAZY (*)    Glucose, UA 50 (*)    Ketones, ur 80 (*)    Protein, ur 30 (*)    Leukocytes,Ua MODERATE (*)    WBC, UA >50 (*)    Bacteria, UA RARE (*)    All other components within normal limits  LIPASE, BLOOD   Radiology CT ABDOMEN PELVIS W CONTRAST  Result Date: 05/16/2021 CLINICAL DATA:  Abdominal pain with nausea and vomiting. EXAM: CT ABDOMEN AND PELVIS WITH CONTRAST TECHNIQUE: Multidetector CT imaging of the abdomen and pelvis was performed using the standard protocol following bolus administration of intravenous contrast. CONTRAST:  70m OMNIPAQUE IOHEXOL 350 MG/ML SOLN COMPARISON:  February 02, 2020 FINDINGS: Lower chest: Mild atelectasis is seen within the bilateral lung bases. Hepatobiliary: No focal liver abnormality is seen. A 2.0 cm x 1.9 cm gallstone is seen within the neck of a moderately distended gallbladder. Additional 1.9 cm x 1.7 cm gallstone is seen within the dependent portion of the body of the gallbladder. Very mild pericholecystic inflammatory fat stranding is seen. The common bile duct measures approximately 7 mm. Pancreas: Unremarkable. No pancreatic ductal dilatation  or surrounding inflammatory changes. Spleen: Normal in size without focal abnormality. Adrenals/Urinary Tract: Adrenal glands are unremarkable. Kidneys are normal, without renal  calculi, focal lesion, or hydronephrosis. Bladder is unremarkable. Stomach/Bowel: Stomach is within normal limits. The appendix is not visualized. Stool is seen throughout the large bowel. No evidence of bowel wall thickening, distention, or inflammatory changes. Vascular/Lymphatic: Aortic atherosclerosis. No enlarged abdominal or pelvic lymph nodes. Reproductive: Status post hysterectomy. No adnexal masses. Other: No abdominal wall hernia or abnormality. No abdominopelvic ascites. Musculoskeletal: No acute or significant osseous findings. IMPRESSION: Cholelithiasis with findings consistent with acute cholecystitis. Aortic Atherosclerosis (ICD10-I70.0). Electronically Signed   By: Virgina Norfolk M.D.   On: 05/16/2021 03:31    Procedures Procedures   Medications Ordered in ED Medications  lactated ringers bolus 1,000 mL (has no administration in time range)  morphine 4 MG/ML injection 4 mg (has no administration in time range)  ondansetron (ZOFRAN) injection 4 mg (has no administration in time range)    ED Course  I have reviewed the triage vital signs and the nursing notes.  Pertinent labs & imaging results that were available during my care of the patient were reviewed by me and considered in my medical decision making (see chart for details).   MDM Rules/Calculators/A&P                         Right upper quadrant pain suspicious for biliary colic.  Old records are reviewed confirming CT scans which have shown presence of cholelithiasis.  Labs had been obtained prior to my seeing the patient and do show significant leukocytosis with left shift, also borderline hyponatremia which is not felt to be clinically significant.  In light of leukocytosis, will send for CT of abdomen and pelvis to make sure there is no other pathology.  She will be given IV fluids, morphine, ondansetron.  CT is consistent with acute cholecystitis.  However, patient states that pain is much improved and she is not  having any nausea.  She is felt to be safe for discharge.  She is discharged with prescription for tramadol and ondansetron, referred to general surgery for evaluation for elective cholecystectomy.  Return precautions discussed.  Final Clinical Impression(s) / ED Diagnoses Final diagnoses:  Cholecystitis  Hyponatremia  Elevated blood pressure reading with diagnosis of hypertension    Rx / DC Orders ED Discharge Orders          Ordered    traMADol (ULTRAM) 50 MG tablet  Every 6 hours PRN        05/16/21 0407    ondansetron (ZOFRAN) 4 MG tablet  Every 6 hours PRN        05/16/21 4098             Delora Fuel, MD 11/91/47 [phone]

## 2021-05-16 NOTE — Discharge Instructions (Signed)
Return if pain is not being adequately controlled, vomiting is not being adequately controlled, or if you start running a fever.  Stay on a low-fat diet.

## 2021-05-17 ENCOUNTER — Encounter: Payer: Self-pay | Admitting: Internal Medicine

## 2021-05-17 ENCOUNTER — Other Ambulatory Visit: Payer: Self-pay

## 2021-05-17 ENCOUNTER — Ambulatory Visit (INDEPENDENT_AMBULATORY_CARE_PROVIDER_SITE_OTHER): Payer: Medicare Other | Admitting: Internal Medicine

## 2021-05-17 VITALS — BP 120/64 | HR 98 | Temp 99.0°F | Ht 66.0 in | Wt 164.0 lb

## 2021-05-17 DIAGNOSIS — K8000 Calculus of gallbladder with acute cholecystitis without obstruction: Secondary | ICD-10-CM | POA: Diagnosis not present

## 2021-05-17 DIAGNOSIS — I6523 Occlusion and stenosis of bilateral carotid arteries: Secondary | ICD-10-CM

## 2021-05-17 MED ORDER — ONDANSETRON HCL 4 MG PO TABS: 4.0000 mg | ORAL_TABLET | Freq: Four times a day (QID) | ORAL | 0 refills | Status: DC | PRN

## 2021-05-17 MED ORDER — TRAMADOL HCL 50 MG PO TABS: 50.0000 mg | ORAL_TABLET | Freq: Four times a day (QID) | ORAL | 0 refills | Status: DC | PRN

## 2021-05-17 NOTE — Telephone Encounter (Signed)
Pt. Called on 10.24.2022 and states she has back pain, and severe upper abdominal pain, and vomiting x 3 and still nauseated. 0200 started, indigestion, constipation - took meds and didn't help. Didn't feel well prior night either, ate apple and PB. Chills all night.Pt. was advised to go to ED and pt. Did comply.

## 2021-05-17 NOTE — Patient Instructions (Addendum)
   Continue a bland diet.  Continue increased water intake.    Medications changes include :   continue tramadol for pain and zofran as needed for nausea.    Your prescription(s) have been submitted to your pharmacy. Please take as directed and contact our office if you believe you are having problem(s) with the medication(s).   A referral was ordered for [facility] surgery - call them tomorrow -- Phone: [phone].       Someone from their office will call you to schedule an appointment.

## 2021-05-17 NOTE — Assessment & Plan Note (Addendum)
Chronic-has had gallstones since at least 2014 and has never had symptoms.  She was told by previous doctor that this was a large stone and should not cause any issues and has not wanted to have anything done with it Went to the ED for right upper quadrant pain-symptomatic cholelithiasis, without cholecystitis Needs cholecystectomy Referred to surgery Continue Zofran as needed, tramadol as needed Low-fat diet

## 2021-05-17 NOTE — Assessment & Plan Note (Signed)
Acute Right upper quadrant pain radiating towards back-CT scan with cholelithiasis with acute cholecystitis She is still having significant pain, minimal nausea She is eating minimal, but is trying to increase her water intake Needs surgery as soon as possible-urgent referral to surgery Continue tramadol 50 mg every 6 hours as needed for pain-discussed this may make her chronic constipation worse.  Continue stool softener Continue Zofran 4 mg every 6 hours as needed for nausea Refills of the above medication sent to pharmacy She will call with any concerns-I did advise her to call surgery tomorrow to help facilitate making an appointment as soon as possible

## 2021-05-17 NOTE — Progress Notes (Signed)
Subjective:    Patient ID: [name], female    DOB: Dec 09, 1940, 80 y.o.   MRN: [id number]  This visit occurred during the SARS-CoV-2 public health emergency.  Safety protocols were in place, including screening questions prior to the visit, additional usage of staff PPE, and extensive cleaning of exam room while observing appropriate contact time as indicated for disinfecting solutions.     HPI The patient is here for follow up from the emergency room.  05/15/2021-she went to the emergency room for right upper quadrant abdominal pain that started the night before.  Pain radiates to the back and she had associated nausea and vomiting.  Pain at its worst was 7/10.  She had no chills, fevers.  She denied other episodes, but has a known gallstone.  White blood cell count was elevated.  CT of the abdomen and pelvis showed cholelithiasis with acute cholecystitis.    She is still having pain.  Pain is improved slightly, but right upper quadrant and mid back is still tender.  Pain is worse with any movement, hiccuping, coughing or sneezing.  It is best when she stays still.  She is experiencing constipation.  She has not been eating much at all.  She is just starting to drink a little bit of water and has been eating some bland food.  She is having some minimal nausea intermittently.  She has not vomited since leaving the emergency room.  She has been taking the tramadol and Zofran and that has helped.  She denies any fevers.  Medications and allergies reviewed with patient and updated if appropriate.  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     aspirin 81 MG tablet Take 81 mg by mouth at bedtime.      benazepril (LOTENSIN) 20 MG tablet TAKE 1 TABLET DAILY 90 tablet 3   blood glucose meter kit and supplies KIT Dispense based on patient and insurance preference. Use up to four times daily as directed. (FOR E11.9). 1 each 0   calcium citrate-vitamin D 500-400 MG-UNIT chewable tablet Chew 1 tablet by mouth at bedtime.      cephALEXin (KEFLEX) 250 MG capsule Take 250 mg by mouth daily.     Cholecalciferol (VITAMIN D3) 1000 UNITS CAPS Take 1 capsule by mouth daily.     CRANBERRY PO Take 300 mg by mouth daily.     cycloSPORINE (RESTASIS) 0.05 % ophthalmic emulsion Place 1 drop into both eyes 2 (two)  times daily.     diazepam (VALIUM) 5 MG tablet TAKE 1 TABLET EVERY 12 HOURS AS NEEDED FOR ANXIETY 180 tablet 0   Dulaglutide (TRULICITY) 3 ZH/0.8MV SOPN Inject 3 mg as directed once a week. 6 mL 2   escitalopram (LEXAPRO) 10 MG tablet TAKE 1 TABLET DAILY 90 tablet 3   estradiol (ESTRACE) 0.1 MG/GM vaginal cream HQIONGEX:5.2 Applicator Vaginal 3 Times a Week     metFORMIN (GLUCOPHAGE-XR) 500 MG 24 hr tablet TAKE 2 TABLETS WITH BREAKFAST AND 1 TABLET WITH DINNER 270 tablet 3   methylcellulose (ARTIFICIAL TEARS) 1 % ophthalmic  solution Place 1 drop into both eyes daily as needed (itching eyes).      Multiple Vitamins-Calcium (ONE-A-DAY WOMENS FORMULA) TABS Take 1 tablet by mouth daily.     pravastatin (PRAVACHOL) 40 MG tablet TAKE 1 TABLET AT BEDTIME 90 tablet 3   SYNTHROID 50 MCG tablet TAKE 1 TABLET DAILY 90 tablet 3   No current facility-administered medications on file prior to visit.    Past Medical History:  Diagnosis Date   Anxiety    Arthritis HANDS, NECK , BACK   Asymptomatic carotid artery stenosis LEFT --  MOD. PER DR WILLIS NOTE OCT 2012   Benign heart murmur    Depression    PMH of   Diabetes mellitus ORAL MED   DVT (deep venous thrombosis) ([place]) 2006   after arthroscopy   Frequency of urination    History of CVA (cerebrovascular accident) NEUROLOGIST  DR WILLIS----  06-01-2010-  LEFT BASAL GANGLIA HEMORRAGE   RESIDUAL RIGHT HAND NUMBNESS/TINGLING   Hyperlipidemia    Hypertension    Hypothyroid    MRSA (methicillin resistant staph aureus) culture positive    X3; last post TKR   Nocturia    Numbness and tingling in right hand RESIDUAL FROM CVA NOV 2011   Obesity    Redux therapy   Osteoporosis    Thyroid disease    right thyroid nodule-- BX DONE 08-15-2011    Past Surgical History:  Procedure Laterality Date   ABDOMINAL HYSTERECTOMY  1973   Endometriosis & fibroid   CARPAL TUNNEL RELEASE  2000   RIGHT   COLONOSCOPY  1999 & 2005   Dr Olevia Perches   cystoscope     to evaluate recurrent UTIs   CYSTOSCOPY W/ RETROGRADES  08/22/2011   Procedure: CYSTOSCOPY WITH RETROGRADE PYELOGRAM;  Surgeon: Molli Hazard, MD;  Location: [facility];  Service: Urology;  Laterality: Bilateral;   CYSTOSCOPY WITH BIOPSY  08/22/2011   Procedure: CYSTOSCOPY WITH BIOPSY;  Surgeon: Molli Hazard, MD;  [facility]  Service: Urology;  Laterality: N/A;   g2 p2     JOINT REPLACEMENT  03-28-2006   LEFT THUMB   KNEE ARTHROSCOPY     BILATERAL PRIOR TO TOTAL  KNEE   LEFT SHOULDER SURG  1990'S   LUMBAR LAMINECTOMY  08-26-2009   L 4 - 5   THYROID NODULE BX  08-15-2011   RIGHT   TOTAL KNEE ARTHROPLASTY  05-26-2002      LEFT   AND RIGHT  03-30-2098   WRIST SURGERY  2014   tendon placed in joint; Dr Burney Gauze    Social History   Socioeconomic History   Marital status: Widowed    Spouse name: Not on file   Number of children: 2   Years of education: Not on file   Highest education level: Not on file  Occupational History   Not  on file  Tobacco Use   Smoking status: Never   Smokeless tobacco: Never  Vaping Use   Vaping Use: Never used  Substance and Sexual Activity   Alcohol use: Yes    Comment:  very rarely , < 1 glass wine / month   Drug use: No   Sexual activity: Not Currently  Other Topics Concern   Not on file  Social History Narrative   Not on file   Social Determinants of Health   Financial Resource Strain: Low Risk    Difficulty of Paying Living Expenses: Not hard at all  Food Insecurity: No Food Insecurity   Worried About Charity fundraiser in the Last Year: Never true   [place] in the Last Year: Never true  Transportation Needs: No Transportation Needs   Lack of Transportation (Medical): No   Lack of Transportation (Non-Medical): No  Physical Activity: Sufficiently Active   Days of Exercise per Week: 3 days   Minutes of Exercise per Session: 60 min  Stress: No Stress Concern Present   Feeling of Stress : Not at all  Social Connections: Moderately Integrated   Frequency of Communication with Friends and Family: More than three times a week   Frequency of Social Gatherings with Friends and Family: More than three times a week   Attends Religious Services: More than 4 times per year   Active Member of [employer] or Organizations: Yes   Attends Archivist Meetings: More than 4 times per year   Marital Status: Widowed    Family History  Problem Relation Age of Onset   Bladder Cancer Mother     Diabetes Mother    Brain cancer Sister    Stroke Paternal Grandfather        in 39s   Diabetes Sister 46       TYPE 1    Prostate cancer Brother    Alzheimer's disease Brother    Diabetes Maternal Aunt    Diabetes Maternal Uncle    Diabetes Maternal Grandmother        Type 2   Diabetes Maternal Grandfather        Type 2   Pancreatic cancer Sister    Kidney failure Sister        in context of DM & influenza   Heart disease Neg Hx     Review of Systems  Constitutional:  Positive for chills. Negative for fever.  Gastrointestinal:  Positive for abdominal pain, constipation, nausea (mild) and vomiting (not since leaving ED). Negative for blood in stool.  Neurological:  Positive for light-headedness and headaches.      Objective:   Vitals:   05/17/21 1558  BP: 120/64  Pulse: 98  Temp: 99 F (37.2 C)  SpO2: 96%   BP Readings from Last 3 Encounters:  05/17/21 120/64  05/16/21 (!) 147/81  03/16/21 124/80   Wt Readings from Last 3 Encounters:  05/17/21 164 lb (74.4 kg)  05/15/21 158 lb 0.2 oz (71.7 kg)  03/16/21 163 lb (73.9 kg)   Body mass index is 26.47 kg/m.   Physical Exam    Constitutional: Appears well-developed and well-nourished. No distress.  HENT:  Head: Normocephalic and atraumatic.  Abdomen: Soft, nondistended, tenderness with light palpation right upper quadrant around towards back Skin: Skin is warm and dry. Not diaphoretic.       Assessment & Plan:    See Problem List for Assessment and Plan of chronic medical problems.

## 2021-05-22 ENCOUNTER — Other Ambulatory Visit: Payer: Self-pay

## 2021-05-22 ENCOUNTER — Other Ambulatory Visit: Payer: Self-pay | Admitting: Surgery

## 2021-05-22 ENCOUNTER — Encounter (HOSPITAL_COMMUNITY): Payer: Self-pay | Admitting: Surgery

## 2021-05-22 DIAGNOSIS — K8020 Calculus of gallbladder without cholecystitis without obstruction: Secondary | ICD-10-CM | POA: Diagnosis not present

## 2021-05-22 NOTE — H&P (Signed)
REFERRING PHYSICIAN:  Binnie Rail, MD   PROVIDER:  Tami Lin, MD (Dr. Nedra Hai as well)   MRN: [id number] DOB: Aug 22, 1940 DATE OF ENCOUNTER: 05/22/2021   Subjective    Chief Complaint: Possible gallstones; recently seen for cholecystitis by ER     History of Present Illness: [name] is a 80 y.o. female with history of HTN, DM, HLD, hypothyroidism, stroke whom is seen in the office today as a referral by Dr. Quay Burow for evaluation of possible symptomatic cholelithiasis.   She presented to the emergency department 05/16/2021 with right upper quadrant abdominal pain that radiated to her back.  She underwent work-up.   WBC 16. LFTs were normal.  Lipase normal.   CT abdomen/pelvis 05/16/2021 which I reviewed showed gallstones with findings concerning for acute cholecystitis.  There is a 2 x 2 centimeter gallstone in the neck of the gallbladder that is moderately distended.  An additional 2 x 2 centimeter stone in the dependent portion of the gallbladder body.  Mild pericholecystic inflammatory changes.  CBD 7 mm   Her pain had improved and by review of the ER documentation, she was discharged home.   She was referred to see [place] in follow-up.  She reports that she has had ongoing discomfort in the right upper quadrant.  This is improved relative when she was in the hospital being evaluated.  She did have nausea and vomiting the first day but has had none since.  Decreased appetite however.  No fever or chills at home.   PMH: HTN, DM, HLD, hypothyroidism, stroke   PSH: Sigmoidectomy years ago for which she describes to be a colovesical fistula.   FHx: Denies any known family history of colorectal, breast, endometrial or ovarian cancer   Social Hx: Denies use of tobacco/EtOH/illicit drug.  She is retired.  She is here today with her daughter.     Review of Systems: A complete review of systems was obtained from the patient.  I have reviewed this information and  discussed as appropriate with the patient.  See HPI as well for other ROS.   All of the below systems have been reviewed with the patient and positives are indicated with bold text General: chills, fever or night sweats Eyes: blurry vision or double vision ENT: epistaxis or sore throat Allergy/Immunology: itchy/watery eyes or nasal congestion Hematologic/Lymphatic: bleeding problems, blood clots or swollen lymph nodes Endocrine: temperature intolerance or unexpected weight changes Breast: new or changing breast lumps or nipple discharge Resp: cough, shortness of breath, or wheezing CV: chest pain or dyspnea on exertion GI: as per HPI GU: dysuria, trouble voiding, or hematuria MSK: joint pain or joint stiffness Neuro: TIA or stroke symptoms Derm: pruritus and skin lesion changes Psych: anxiety and depression     Medical History: Past Medical History      Past Medical History:  Diagnosis Date   Anxiety     Arthritis     Diabetes mellitus without complication (CMS-HCC)     History of stroke     Thyroid disease          There is no problem list on file for this patient.     Past Surgical History       Past Surgical History:  Procedure Laterality Date   knee       MASTECTOMY PARTIAL / LUMPECTOMY            Allergies       Allergies  Allergen Reactions  Conjugated Estrogens Other (See Comments)      REACTION: weight gain  & fibrocystic breast disease changes REACTION: weight gain  & fibrocystic breast disease changes     Oxycodone-Acetaminophen Itching   Tolterodine Other (See Comments)      Rxed by [facility] Excessive drying Rxed by [facility] Excessive drying                Current Outpatient Medications on File Prior to Visit  Medication Sig Dispense Refill   amoxicillin (AMOXIL) 500 MG tablet         benazepriL (LOTENSIN) 20 MG tablet         cephalexin (KEFLEX) 250 MG capsule         diazePAM (VALIUM) 5 MG tablet         escitalopram  oxalate (LEXAPRO) 10 MG tablet         estradioL (ESTRACE) 0.01 % (0.1 mg/gram) vaginal cream         levothyroxine (SYNTHROID) 50 MCG tablet         methenamine hippurate (HIPREX) 1 gram tablet         miscellaneous medical supply Misc Dispense based on patient and insurance preference. Use up to four times daily as directed. (FOR E11.9).       aspirin 81 MG EC tablet Take by mouth       CALCIUM CITRATE-VITAMIN D2 ORAL Take by mouth       cranberry fruit extract (CRANBERRY EXTRACT, BULK,) 12:1 Powd Take by mouth       cyclobenzaprine (FLEXERIL) 10 MG tablet Take 10 mg by mouth 3 (three) times daily as needed for Muscle spasms       metFORMIN (GLUCOPHAGE-XR) 500 MG XR tablet         ondansetron (ZOFRAN) 4 MG tablet         pravastatin (PRAVACHOL) 40 MG tablet         traMADoL (ULTRAM) 50 mg tablet         TRULICITY 3 IA/1.6 mL subcutaneous pen injector          No current facility-administered medications on file prior to visit.      Family History  History reviewed. No pertinent family history.      Social History       Tobacco Use  Smoking Status Never Smoker  Smokeless Tobacco Never Used      Social History  Social History        Socioeconomic History   Marital status: Widowed  Tobacco Use   Smoking status: Never Smoker   Smokeless tobacco: Never Used  Substance and Sexual Activity   Alcohol use: Never   Drug use: Never        Objective:         Vitals:    05/22/21 1055  BP: (!) 140/80  Pulse: 95  Temp: 36.4 C (97.6 F)  SpO2: 97%  Weight: 72.9 kg (160 lb 12.8 oz)  Height: 167.6 cm (5\' 6" )    Body mass index is 25.95 kg/m.   Constitutional: NAD; conversant; no deformities; wearing mask Eyes: Moist conjunctiva; no lid lag; anicteric Neck: Trachea midline; no thyromegaly Lungs: Normal respiratory effort; no tactile fremitus CV: RRR; no palpable thrills; no pitting edema GI: Abdomen is soft, focally tender/discomfort to palpation RUQ; negative  Murphy's; no palpable hepatosplenomegaly MSK: Normal range of motion of extremities; no clubbing/cyanosis Psychiatric: Appropriate affect; alert and oriented x3 Lymphatic: No palpable cervical or axillary lymphadenopathy  Labs, Imaging and Diagnostic Testing: I have personally reviewed the relevant labs and CT scan 05/16/21   Assessment and Plan:  Diagnoses and all orders for this visit:   Symptomatic cholelithiasis       [name] is a very pleasant 80 y.o. female with hx of HTN, DM, HLD, hypothyroidism, stroke here for evaluation of symptomatic cholelithiasis/recent acute cholecystitis. Has been afebrile, improved but persistent symptoms.   -We spent time today reviewing her work-up thus far. -The anatomy and physiology of the hepatobiliary system was discussed with the patient with associated pictures. We also spent time reviewing the pathophysiology of gallbladder disease. -The options for treatment were discussed and we have recommended surgery. We reviewed surgery as the most definitive treatment option moving forward - laparoscopic cholecystectomy. -Given her symptoms which are improved but persistent, we discussed proceeding urgently with cholecystectomy.  I have spoken to our on-call physician at [facility], Dr. Ninfa Linden, about this.  We will tentatively plan for her to have her surgery done in the morning.  We discussed that she would meet him in preop prior to surgery as well. -The planned procedure, material risks (including, but not limited to, pain, bleeding, infection, scarring, need for blood transfusion, damage to surrounding structures- blood vessels/nerves/viscus/organs, damage to bile duct, bile leak, need for additional procedures, hernia, worsening of pre-existing medical conditions, pancreatitis, pneumonia, heart attack, stroke, death) benefits and alternatives to surgery were discussed at length. I noted a good probability that the procedure would help improve their  symptoms. The patient's questions were answered to her satisfaction, she voiced understanding and they elected to proceed with surgery. Additionally, we discussed typical postoperative expectations and the recovery process.

## 2021-05-22 NOTE — Progress Notes (Addendum)
COVID swab appointment: Same Day Covid Test  COVID Vaccine Completed: Yes x2 Date COVID Vaccine completed:  08-11-19 09-01-19 Has received booster:  Yes x1 04-28-20 COVID vaccine manufacturer: [employer]      Date of COVID positive in last 90 days:  No  PCP - Billey Gosling, MD Cardiologist - N/A  Chest x-ray - N/A EKG - N/A Stress Test - N/A ECHO - 06-19-2010 Epic Cardiac Cath - N/A Pacemaker/ICD device last checked: Spinal Cord Stimulator:  Sleep Study - Yes, mild sleep apnea CPAP - No  Fasting Blood Sugar - 120 to 190 Checks Blood Sugar - one time a day  Blood Thinner Instructions: Aspirin Instructions:  ASA 81 mg Last Dose:  05-21-21  Activity level:  Can go up a flight of stairs and perform activities of daily living without stopping and without symptoms of chest pain or shortness of breath.  Able to exercise without symptoms, does water aerobics 3 times a week   Anesthesia review: N/A  Hx of stroke in 2011, only deficit is numbness in the fingertips of right hand.  Patient denies shortness of breath, fever, cough and chest pain at PAT appointment   Patient verbalized understanding of instructions that were given to them at the PAT appointment. Patient was also instructed that they will need to review over the PAT instructions again at home before surgery.

## 2021-05-23 ENCOUNTER — Encounter (HOSPITAL_COMMUNITY): Payer: Self-pay | Admitting: Surgery

## 2021-05-23 ENCOUNTER — Observation Stay (HOSPITAL_COMMUNITY)
Admission: RE | Admit: 2021-05-23 | Discharge: 2021-05-24 | Disposition: A | Discharge status: Home / Self Care | Payer: Medicare Other | Attending: Surgery | Admitting: Surgery

## 2021-05-23 ENCOUNTER — Encounter (HOSPITAL_COMMUNITY): Admission: RE | Disposition: A | Discharge status: Home / Self Care | Payer: Self-pay | Source: Home / Self Care

## 2021-05-23 ENCOUNTER — Ambulatory Visit (HOSPITAL_COMMUNITY): Payer: Medicare Other | Admitting: Certified Registered"

## 2021-05-23 DIAGNOSIS — Z7982 Long term (current) use of aspirin: Secondary | ICD-10-CM | POA: Insufficient documentation

## 2021-05-23 DIAGNOSIS — K8000 Calculus of gallbladder with acute cholecystitis without obstruction: Secondary | ICD-10-CM | POA: Diagnosis not present

## 2021-05-23 DIAGNOSIS — I10 Essential (primary) hypertension: Secondary | ICD-10-CM | POA: Diagnosis not present

## 2021-05-23 DIAGNOSIS — Z79899 Other long term (current) drug therapy: Secondary | ICD-10-CM | POA: Insufficient documentation

## 2021-05-23 DIAGNOSIS — Z9049 Acquired absence of other specified parts of digestive tract: Secondary | ICD-10-CM

## 2021-05-23 DIAGNOSIS — K8012 Calculus of gallbladder with acute and chronic cholecystitis without obstruction: Secondary | ICD-10-CM | POA: Diagnosis not present

## 2021-05-23 DIAGNOSIS — E119 Type 2 diabetes mellitus without complications: Secondary | ICD-10-CM | POA: Diagnosis not present

## 2021-05-23 DIAGNOSIS — E559 Vitamin D deficiency, unspecified: Secondary | ICD-10-CM | POA: Diagnosis not present

## 2021-05-23 DIAGNOSIS — Z20822 Contact with and (suspected) exposure to covid-19: Secondary | ICD-10-CM | POA: Diagnosis not present

## 2021-05-23 DIAGNOSIS — E039 Hypothyroidism, unspecified: Secondary | ICD-10-CM | POA: Insufficient documentation

## 2021-05-23 DIAGNOSIS — Z7984 Long term (current) use of oral hypoglycemic drugs: Secondary | ICD-10-CM | POA: Diagnosis not present

## 2021-05-23 DIAGNOSIS — K8066 Calculus of gallbladder and bile duct with acute and chronic cholecystitis without obstruction: Secondary | ICD-10-CM | POA: Diagnosis not present

## 2021-05-23 DIAGNOSIS — Z01818 Encounter for other preprocedural examination: Secondary | ICD-10-CM

## 2021-05-23 DIAGNOSIS — F411 Generalized anxiety disorder: Secondary | ICD-10-CM | POA: Diagnosis not present

## 2021-05-23 HISTORY — PX: CHOLECYSTECTOMY: SHX55

## 2021-05-23 HISTORY — DX: Cerebral infarction, unspecified: I63.9

## 2021-05-23 HISTORY — DX: Headache, unspecified: R51.9

## 2021-05-23 LAB — CBC
HCT: 36.3 % (ref 36.0–46.0)
Hemoglobin: 12.5 g/dL (ref 12.0–15.0)
MCH: 31.3 pg (ref 26.0–34.0)
MCHC: 34.4 g/dL (ref 30.0–36.0)
MCV: 90.8 fL (ref 80.0–100.0)
Platelets: 336 10*3/uL (ref 150–400)
RBC: 4 MIL/uL (ref 3.87–5.11)
RDW: 12 % (ref 11.5–15.5)
WBC: 8.2 10*3/uL (ref 4.0–10.5)
nRBC: 0 % (ref 0.0–0.2)

## 2021-05-23 LAB — HEMOGLOBIN A1C
Hgb A1c MFr Bld: 6.1 % — ABNORMAL HIGH (ref 4.8–5.6)
Mean Plasma Glucose: 128.37 mg/dL

## 2021-05-23 LAB — GLUCOSE, CAPILLARY
Glucose-Capillary: 180 mg/dL — ABNORMAL HIGH (ref 70–99)
Glucose-Capillary: 151 mg/dL — ABNORMAL HIGH (ref 70–99)
Glucose-Capillary: 180 mg/dL — ABNORMAL HIGH (ref 70–99)
Glucose-Capillary: 203 mg/dL — ABNORMAL HIGH (ref 70–99)
Glucose-Capillary: 153 mg/dL — ABNORMAL HIGH (ref 70–99)

## 2021-05-23 LAB — SURGICAL PCR SCREEN
MRSA, PCR: NEGATIVE
Staphylococcus aureus: NEGATIVE

## 2021-05-23 LAB — SARS CORONAVIRUS 2 BY RT PCR (HOSPITAL ORDER, PERFORMED IN ~~LOC~~ HOSPITAL LAB): SARS Coronavirus 2: NEGATIVE

## 2021-05-23 LAB — CREATININE, SERUM
Creatinine, Ser: 0.6 mg/dL (ref 0.44–1.00)
GFR, Estimated: >60 mL/min (ref >60)

## 2021-05-23 SURGERY — LAPAROSCOPIC CHOLECYSTECTOMY
Anesthesia: General | Site: Abdomen

## 2021-05-23 MED ORDER — ESCITALOPRAM OXALATE 10 MG PO TABS
10.0000 mg | ORAL_TABLET | Freq: Every day | ORAL | Status: DC
  Administered 2021-05-24: 10 mg via ORAL
  Filled 2021-05-23: qty 1

## 2021-05-23 MED ORDER — FENTANYL CITRATE PF 50 MCG/ML IJ SOSY
PREFILLED_SYRINGE | INTRAMUSCULAR | Status: DC
Start: 2021-05-23 — End: 2021-05-23
  Filled 2021-05-23: qty 3

## 2021-05-23 MED ORDER — LIDOCAINE 2% (20 MG/ML) 5 ML SYRINGE
INTRAMUSCULAR | Status: DC | PRN
  Administered 2021-05-23: 70 mg via INTRAVENOUS

## 2021-05-23 MED ORDER — FENTANYL CITRATE (PF) 100 MCG/2ML IJ SOLN
INTRAMUSCULAR | Status: DC | PRN
  Administered 2021-05-23 (×4): 50 ug via INTRAVENOUS

## 2021-05-23 MED ORDER — CHLORHEXIDINE GLUCONATE CLOTH 2 % EX PADS: 6.0000 | MEDICATED_PAD | Freq: Once | CUTANEOUS | Status: DC

## 2021-05-23 MED ORDER — PANTOPRAZOLE SODIUM 40 MG IV SOLR
40.0000 mg | Freq: Every day | INTRAVENOUS | Status: DC
  Administered 2021-05-23: 40 mg via INTRAVENOUS
  Filled 2021-05-23: qty 40

## 2021-05-23 MED ORDER — DOCUSATE SODIUM 100 MG PO CAPS
100.0000 mg | ORAL_CAPSULE | Freq: Two times a day (BID) | ORAL | Status: DC
  Administered 2021-05-23 – 2021-05-24 (×2): 100 mg via ORAL
  Filled 2021-05-23 (×2): qty 1

## 2021-05-23 MED ORDER — INSULIN ASPART 100 UNIT/ML IJ SOLN: 0.0000 [IU] | Freq: Every day | INTRAMUSCULAR | Status: DC

## 2021-05-23 MED ORDER — ORAL CARE MOUTH RINSE: 15.0000 mL | Freq: Once | OROMUCOSAL | Status: AC

## 2021-05-23 MED ORDER — FENTANYL CITRATE PF 50 MCG/ML IJ SOSY: 25.0000 ug | PREFILLED_SYRINGE | INTRAMUSCULAR | Status: DC | PRN

## 2021-05-23 MED ORDER — 0.9 % SODIUM CHLORIDE (POUR BTL) OPTIME
TOPICAL | Status: DC | PRN
  Administered 2021-05-23: 275 mL

## 2021-05-23 MED ORDER — ACETAMINOPHEN 500 MG PO TABS
1000.0000 mg | ORAL_TABLET | ORAL | Status: AC
  Administered 2021-05-23: 1000 mg via ORAL
  Filled 2021-05-23: qty 2

## 2021-05-23 MED ORDER — FENTANYL CITRATE (PF) 100 MCG/2ML IJ SOLN
INTRAMUSCULAR | Status: AC
  Filled 2021-05-23: qty 2

## 2021-05-23 MED ORDER — SUGAMMADEX SODIUM 200 MG/2ML IV SOLN
INTRAVENOUS | Status: DC | PRN
  Administered 2021-05-23: 200 mg via INTRAVENOUS

## 2021-05-23 MED ORDER — BENAZEPRIL HCL 10 MG PO TABS: 20.0000 mg | ORAL_TABLET | Freq: Every day | ORAL | Status: DC

## 2021-05-23 MED ORDER — DIPHENHYDRAMINE HCL 50 MG/ML IJ SOLN: 12.5000 mg | Freq: Four times a day (QID) | INTRAMUSCULAR | Status: DC | PRN

## 2021-05-23 MED ORDER — PRAVASTATIN SODIUM 20 MG PO TABS
40.0000 mg | ORAL_TABLET | Freq: Every day | ORAL | Status: DC
  Administered 2021-05-23: 40 mg via ORAL
  Filled 2021-05-23: qty 2

## 2021-05-23 MED ORDER — PHENYLEPHRINE HCL-NACL 20-0.9 MG/250ML-% IV SOLN
INTRAVENOUS | Status: AC
  Filled 2021-05-23: qty 250

## 2021-05-23 MED ORDER — ONDANSETRON HCL 4 MG/2ML IJ SOLN: 4.0000 mg | Freq: Four times a day (QID) | INTRAMUSCULAR | Status: DC | PRN

## 2021-05-23 MED ORDER — LEVOTHYROXINE SODIUM 50 MCG PO TABS
50.0000 ug | ORAL_TABLET | Freq: Every day | ORAL | Status: DC
  Administered 2021-05-24: 50 ug via ORAL
  Filled 2021-05-23: qty 1

## 2021-05-23 MED ORDER — LACTATED RINGERS IV SOLN: INTRAVENOUS | Status: DC

## 2021-05-23 MED ORDER — OXYCODONE HCL 5 MG PO TABS
5.0000 mg | ORAL_TABLET | ORAL | Status: DC | PRN
  Administered 2021-05-23: 10 mg via ORAL
  Administered 2021-05-24: 5 mg via ORAL
  Filled 2021-05-23: qty 1
  Filled 2021-05-23: qty 2

## 2021-05-23 MED ORDER — PROPOFOL 10 MG/ML IV BOLUS
INTRAVENOUS | Status: DC | PRN
  Administered 2021-05-23: 150 mg via INTRAVENOUS

## 2021-05-23 MED ORDER — ENOXAPARIN SODIUM 40 MG/0.4ML IJ SOSY
40.0000 mg | PREFILLED_SYRINGE | INTRAMUSCULAR | Status: DC
  Administered 2021-05-24: 40 mg via SUBCUTANEOUS
  Filled 2021-05-23: qty 0.4

## 2021-05-23 MED ORDER — INSULIN ASPART 100 UNIT/ML IJ SOLN
4.0000 [IU] | Freq: Three times a day (TID) | INTRAMUSCULAR | Status: DC
  Administered 2021-05-23 – 2021-05-24 (×2): 4 [IU] via SUBCUTANEOUS

## 2021-05-23 MED ORDER — METFORMIN HCL ER 500 MG PO TB24
500.0000 mg | ORAL_TABLET | Freq: Every day | ORAL | Status: DC
  Administered 2021-05-23: 500 mg via ORAL
  Filled 2021-05-23: qty 1

## 2021-05-23 MED ORDER — SUCCINYLCHOLINE CHLORIDE 200 MG/10ML IV SOSY
PREFILLED_SYRINGE | INTRAVENOUS | Status: DC | PRN
  Administered 2021-05-23: 100 mg via INTRAVENOUS

## 2021-05-23 MED ORDER — ACETAMINOPHEN 500 MG PO TABS
1000.0000 mg | ORAL_TABLET | Freq: Four times a day (QID) | ORAL | Status: DC
  Administered 2021-05-23 – 2021-05-24 (×4): 1000 mg via ORAL
  Filled 2021-05-23 (×4): qty 2

## 2021-05-23 MED ORDER — SODIUM CHLORIDE 0.9 % IV SOLN
2.0000 g | INTRAVENOUS | Status: DC
  Administered 2021-05-23: 2 g via INTRAVENOUS
  Filled 2021-05-23: qty 20

## 2021-05-23 MED ORDER — HYDROCODONE-ACETAMINOPHEN 5-325 MG PO TABS: 1.0000 | ORAL_TABLET | ORAL | Status: DC | PRN

## 2021-05-23 MED ORDER — SODIUM CHLORIDE 0.9 % IV SOLN: INTRAVENOUS | Status: DC

## 2021-05-23 MED ORDER — CYCLOSPORINE 0.05 % OP EMUL
1.0000 [drp] | Freq: Two times a day (BID) | OPHTHALMIC | Status: DC
  Administered 2021-05-23 – 2021-05-24 (×2): 1 [drp] via OPHTHALMIC
  Filled 2021-05-23 (×2): qty 30

## 2021-05-23 MED ORDER — BUPIVACAINE HCL (PF) 0.5 % IJ SOLN
INTRAMUSCULAR | Status: AC
  Filled 2021-05-23: qty 30

## 2021-05-23 MED ORDER — CHLORHEXIDINE GLUCONATE 0.12 % MT SOLN
15.0000 mL | Freq: Once | OROMUCOSAL | Status: AC
  Administered 2021-05-23: 15 mL via OROMUCOSAL

## 2021-05-23 MED ORDER — METOPROLOL TARTRATE 5 MG/5ML IV SOLN: 5.0000 mg | Freq: Four times a day (QID) | INTRAVENOUS | Status: DC | PRN

## 2021-05-23 MED ORDER — HYDROMORPHONE HCL 1 MG/ML IJ SOLN: 0.5000 mg | INTRAMUSCULAR | Status: DC | PRN

## 2021-05-23 MED ORDER — BUPIVACAINE HCL (PF) 0.5 % IJ SOLN
INTRAMUSCULAR | Status: DC | PRN
  Administered 2021-05-23: 20 mL

## 2021-05-23 MED ORDER — ONDANSETRON HCL 4 MG/2ML IJ SOLN
INTRAMUSCULAR | Status: DC | PRN
  Administered 2021-05-23: 4 mg via INTRAVENOUS

## 2021-05-23 MED ORDER — INSULIN ASPART 100 UNIT/ML IJ SOLN
0.0000 [IU] | Freq: Three times a day (TID) | INTRAMUSCULAR | Status: DC
  Administered 2021-05-23: 5 [IU] via SUBCUTANEOUS

## 2021-05-23 MED ORDER — PHENYLEPHRINE HCL-NACL 20-0.9 MG/250ML-% IV SOLN
INTRAVENOUS | Status: DC | PRN
  Administered 2021-05-23: 50 ug/min via INTRAVENOUS

## 2021-05-23 MED ORDER — DIPHENHYDRAMINE HCL 12.5 MG/5ML PO ELIX: 12.5000 mg | ORAL_SOLUTION | Freq: Four times a day (QID) | ORAL | Status: DC | PRN

## 2021-05-23 MED ORDER — SENNOSIDES-DOCUSATE SODIUM 8.6-50 MG PO TABS: 1.0000 | ORAL_TABLET | Freq: Every evening | ORAL | Status: DC | PRN

## 2021-05-23 MED ORDER — ONDANSETRON 4 MG PO TBDP: 4.0000 mg | ORAL_TABLET | Freq: Four times a day (QID) | ORAL | Status: DC | PRN

## 2021-05-23 MED ORDER — MELATONIN 3 MG PO TABS: 3.0000 mg | ORAL_TABLET | Freq: Every evening | ORAL | Status: DC | PRN

## 2021-05-23 MED ORDER — SODIUM CHLORIDE 0.9 % IR SOLN
Status: DC | PRN
  Administered 2021-05-23: 1000 mL

## 2021-05-23 MED ORDER — DEXAMETHASONE SODIUM PHOSPHATE 10 MG/ML IJ SOLN
INTRAMUSCULAR | Status: DC | PRN
  Administered 2021-05-23: 8 mg via INTRAVENOUS

## 2021-05-23 MED ORDER — METFORMIN HCL ER 500 MG PO TB24
1000.0000 mg | ORAL_TABLET | Freq: Every day | ORAL | Status: DC
  Administered 2021-05-24: 1000 mg via ORAL
  Filled 2021-05-23: qty 2

## 2021-05-23 MED ORDER — CEFAZOLIN SODIUM-DEXTROSE 2-4 GM/100ML-% IV SOLN
2.0000 g | INTRAVENOUS | Status: AC
  Administered 2021-05-23: 2 g via INTRAVENOUS
  Filled 2021-05-23: qty 100

## 2021-05-23 MED ORDER — DIAZEPAM 5 MG PO TABS: 5.0000 mg | ORAL_TABLET | Freq: Every evening | ORAL | Status: DC | PRN

## 2021-05-23 MED ORDER — METHOCARBAMOL 500 MG PO TABS
500.0000 mg | ORAL_TABLET | Freq: Four times a day (QID) | ORAL | Status: DC | PRN
  Administered 2021-05-23: 500 mg via ORAL
  Filled 2021-05-23: qty 1

## 2021-05-23 MED ORDER — SIMETHICONE 80 MG PO CHEW: 40.0000 mg | CHEWABLE_TABLET | Freq: Four times a day (QID) | ORAL | Status: DC | PRN

## 2021-05-23 MED ORDER — ROCURONIUM BROMIDE 10 MG/ML (PF) SYRINGE
PREFILLED_SYRINGE | INTRAVENOUS | Status: DC | PRN
  Administered 2021-05-23: 40 mg via INTRAVENOUS

## 2021-05-23 SURGICAL SUPPLY — 31 items
APPLIER CLIP 5 13 M/L LIGAMAX5 (MISCELLANEOUS) ×2
BAG COUNTER SPONGE SURGICOUNT (BAG) IMPLANT
CABLE HIGH FREQUENCY MONO STRZ (ELECTRODE) ×2 IMPLANT
CHLORAPREP W/TINT 26 (MISCELLANEOUS) ×2 IMPLANT
CLIP APPLIE 5 13 M/L LIGAMAX5 (MISCELLANEOUS) ×1 IMPLANT
COVER MAYO STAND STRL (DRAPES) IMPLANT
DECANTER SPIKE VIAL GLASS SM (MISCELLANEOUS) ×2 IMPLANT
DERMABOND ADVANCED (GAUZE/BANDAGES/DRESSINGS) ×1
DERMABOND ADVANCED .7 DNX12 (GAUZE/BANDAGES/DRESSINGS) ×1 IMPLANT
DRAPE C-ARM 42X120 X-RAY (DRAPES) IMPLANT
ELECT REM PT RETURN 15FT ADLT (MISCELLANEOUS) ×2 IMPLANT
GLOVE SURG ENC MOIS LTX SZ7.5 (GLOVE) ×2 IMPLANT
GOWN STRL REUS W/TWL XL LVL3 (GOWN DISPOSABLE) ×8 IMPLANT
HEMOSTAT SURGICEL 4X8 (HEMOSTASIS) IMPLANT
IRRIG SUCT STRYKERFLOW 2 WTIP (MISCELLANEOUS) ×2
IRRIGATION SUCT STRKRFLW 2 WTP (MISCELLANEOUS) ×1 IMPLANT
KIT BASIN OR (CUSTOM PROCEDURE TRAY) ×2 IMPLANT
KIT TURNOVER KIT A (KITS) IMPLANT
PENCIL SMOKE EVACUATOR (MISCELLANEOUS) ×2 IMPLANT
POUCH SPECIMEN RETRIEVAL 10MM (ENDOMECHANICALS) ×2 IMPLANT
SCISSORS LAP 5X35 DISP (ENDOMECHANICALS) ×2 IMPLANT
SET CHOLANGIOGRAPH MIX (MISCELLANEOUS) IMPLANT
SET TUBE SMOKE EVAC HIGH FLOW (TUBING) ×2 IMPLANT
SLEEVE XCEL OPT CAN 5 100 (ENDOMECHANICALS) ×4 IMPLANT
SUT MNCRL AB 4-0 PS2 18 (SUTURE) ×2 IMPLANT
SUT VICRYL 0 UR6 27IN ABS (SUTURE) ×2 IMPLANT
TOWEL OR 17X26 10 PK STRL BLUE (TOWEL DISPOSABLE) ×2 IMPLANT
TOWEL OR NON WOVEN STRL DISP B (DISPOSABLE) IMPLANT
TRAY LAPAROSCOPIC (CUSTOM PROCEDURE TRAY) ×2 IMPLANT
TROCAR BLADELESS OPT 5 100 (ENDOMECHANICALS) ×2 IMPLANT
TROCAR XCEL BLUNT TIP 100MML (ENDOMECHANICALS) ×2 IMPLANT

## 2021-05-23 NOTE — Discharge Instructions (Addendum)
[facility] ______[facility] °LAPAROSCOPIC SURGERY: POST OP INSTRUCTIONS °Always review your discharge instruction sheet given to you by the facility where your surgery was performed. °IF YOU HAVE DISABILITY OR FAMILY LEAVE FORMS, YOU MUST BRING THEM TO THE OFFICE FOR PROCESSING.   °DO NOT GIVE THEM TO YOUR DOCTOR. ° °A prescription for pain medication may be given to you upon discharge.  Take your pain medication as prescribed, if needed.  If narcotic pain medicine is not needed, then you may take acetaminophen (Tylenol) or ibuprofen (Advil) as needed. °Take your usually prescribed medications unless otherwise directed. °If you need a refill on your pain medication, please contact your pharmacy.  They will contact our office to request authorization. Prescriptions will not be filled after 5pm or on week-ends. °You should follow a light diet the first few days after arrival home, such as soup and crackers, etc.  Be sure to include lots of fluids daily. °Most patients will experience some swelling and bruising in the area of the incisions.  Ice packs will help.  Swelling and bruising can take several days to resolve.  °It is common to experience some constipation if taking pain medication after surgery.  Increasing fluid intake and taking a stool softener (such as Colace) will usually help or prevent this problem from occurring.  A mild laxative (Milk of Magnesia or Miralax) should be taken according to package instructions if there are no bowel movements after 48 hours. °Unless discharge instructions indicate otherwise, you may remove your bandages 24-48 hours after surgery, and you may shower at that time.  You may have steri-strips (small skin tapes) in place directly over the incision.  These strips should be left on the skin for 7-10 days.  If your surgeon used skin glue on the incision, you may shower in 24 hours.  The glue will flake off over the next 2-3 weeks.  Any sutures or staples will be  removed at the office during your follow-up visit. °ACTIVITIES:  You may resume regular (light) daily activities beginning the next day--such as daily self-care, walking, climbing stairs--gradually increasing activities as tolerated.  You may have sexual intercourse when it is comfortable.  Refrain from any heavy lifting or straining until approved by your doctor. °You may drive when you are no longer taking prescription pain medication, you can comfortably wear a seatbelt, and you can safely maneuver your car and apply brakes. °RETURN TO WORK:  __________________________________________________________ °You should see your doctor in the office for a follow-up appointment approximately 2-3 weeks after your surgery.  Make sure that you call for this appointment within a day or two after you arrive home to insure a convenient appointment time. °OTHER INSTRUCTIONS: __________________________________________________________________________________________________________________________ __________________________________________________________________________________________________________________________ °WHEN TO CALL YOUR DOCTOR: °Fever over 101.0 °Inability to urinate °Continued bleeding from incision. °Increased pain, redness, or drainage from the incision. °Increasing abdominal pain ° °The clinic staff is available to answer your questions during regular business hours.  Please don’t hesitate to call and ask to speak to one of the nurses for clinical concerns.  If you have a medical emergency, go to the nearest emergency room or call 911.  A surgeon from [facility] is always on call at the hospital. °[address], [address], [place], [place]  [address] ? [address] [address], [place], [place]   [address] °[phone] ? [phone] ? FAX [phone] °Web site: [email or web address] ° ° ° °Managing Your Pain After Surgery Without Opioids ° ° ° °Thank you for   participating in our program to  help patients manage their pain after surgery without opioids. This is part of our effort to provide you with the best care possible, without exposing you or your family to the risk that opioids pose. ° °What pain can I expect after surgery? °You can expect to have some pain after surgery. This is normal. The pain is typically worse the day after surgery, and quickly begins to get better. °Many studies have found that many patients are able to manage their pain after surgery with Over-the-Counter (OTC) medications such as Tylenol and Motrin. If you have a condition that does not allow you to take Tylenol or Motrin, notify your surgical team. ° °How will I manage my pain? °The best strategy for controlling your pain after surgery is around the clock pain control with Tylenol (acetaminophen) and Motrin (ibuprofen or Advil). Alternating these medications with each other allows you to maximize your pain control. In addition to Tylenol and Motrin, you can use heating pads or ice packs on your incisions to help reduce your pain. ° °How will I alternate your regular strength over-the-counter pain medication? °You will take a dose of pain medication every three hours. °Start by taking 650 mg of Tylenol (2 pills of 325 mg) °3 hours later take 600 mg of Motrin (3 pills of 200 mg) °3 hours after taking the Motrin take 650 mg of Tylenol °3 hours after that take 600 mg of Motrin. ° ° °- 1 - ° °See example - if your first dose of Tylenol is at 12:00 PM ° ° °12:00 PM Tylenol 650 mg (2 pills of 325 mg)  °3:00 PM Motrin 600 mg (3 pills of 200 mg)  °6:00 PM Tylenol 650 mg (2 pills of 325 mg)  °9:00 PM Motrin 600 mg (3 pills of 200 mg)  °Continue alternating every 3 hours  ° °We recommend that you follow this schedule around-the-clock for at least 3 days after surgery, or until you feel that it is no longer needed. Use the table on the last page of this handout to keep track of the medications you are taking. °Important: °Do not take  more than 3000mg of Tylenol or 3200mg of Motrin in a 24-hour period. °Do not take ibuprofen/Motrin if you have a history of bleeding stomach ulcers, severe kidney disease, &/or actively taking a blood thinner ° °What if I still have pain? °If you have pain that is not controlled with the over-the-counter pain medications (Tylenol and Motrin or Advil) you might have what we call “breakthrough” pain. You will receive a prescription for a small amount of an opioid pain medication such as Oxycodone, Tramadol, or Tylenol with Codeine. Use these opioid pills in the first 24 hours after surgery if you have breakthrough pain. Do not take more than 1 pill every 4-6 hours. ° °If you still have uncontrolled pain after using all opioid pills, don't hesitate to call our staff using the number provided. We will help make sure you are managing your pain in the best way possible, and if necessary, we can provide a prescription for additional pain medication. ° ° °Day 1   ° °Time  °Name of Medication Number of pills taken  °Amount of Acetaminophen  °Pain Level  ° °Comments  °AM PM       °AM PM       °AM PM       °AM PM       °AM PM       °  AM PM       °AM PM       °AM PM       °Total Daily amount of Acetaminophen °Do not take more than  3,000 mg per day    ° ° °Day 2   ° °Time  °Name of Medication Number of pills °taken  °Amount of Acetaminophen  °Pain Level  ° °Comments  °AM PM       °AM PM       °AM PM       °AM PM       °AM PM       °AM PM       °AM PM       °AM PM       °Total Daily amount of Acetaminophen °Do not take more than  3,000 mg per day    ° ° °Day 3   ° °Time  °Name of Medication Number of pills taken  °Amount of Acetaminophen  °Pain Level  ° °Comments  °AM PM       °AM PM       °AM PM       °AM PM       ° ° ° °AM PM       °AM PM       °AM PM       °AM PM       °Total Daily amount of Acetaminophen °Do not take more than  3,000 mg per day    ° ° °Day 4   ° °Time  °Name of Medication Number of pills taken  °Amount of  Acetaminophen  °Pain Level  ° °Comments  °AM PM       °AM PM       °AM PM       °AM PM       °AM PM       °AM PM       °AM PM       °AM PM       °Total Daily amount of Acetaminophen °Do not take more than  3,000 mg per day    ° ° °Day 5   ° °Time  °Name of Medication Number °of pills taken  °Amount of Acetaminophen  °Pain Level  ° °Comments  °AM PM       °AM PM       °AM PM       °AM PM       °AM PM       °AM PM       °AM PM       °AM PM       °Total Daily amount of Acetaminophen °Do not take more than  3,000 mg per day    ° ° ° °Day 6   ° °Time  °Name of Medication Number of pills °taken  °Amount of Acetaminophen  °Pain Level  °Comments  °AM PM       °AM PM       °AM PM       °AM PM       °AM PM       °AM PM       °AM PM       °AM PM       °Total Daily amount of Acetaminophen °Do not take more than  3,000 mg per day    ° ° °Day 7   ° °Time  °  Name of Medication Number of pills taken  °Amount of Acetaminophen  °Pain Level  ° °Comments  °AM PM       °AM PM       °AM PM       °AM PM       °AM PM       °AM PM       °AM PM       °AM PM       °Total Daily amount of Acetaminophen °Do not take more than  3,000 mg per day    ° ° ° ° °For additional information about how and where to safely dispose of unused opioid °medications - https://www.morepowerfulnc.org ° °Disclaimer: This document contains information and/or instructional materials adapted from [facility] for the typical patient with your condition. It does not replace medical advice from your health care provider because your experience may differ from that of the °typical patient. Talk to your health care provider if you have any questions about this °document, your condition or your treatment plan. °Adapted from [facility] ° °

## 2021-05-23 NOTE — Interval H&P Note (Signed)
History and Physical Interval Note: no change in H and P  05/23/2021 8:33 AM  [name]  has presented today for surgery, with the diagnosis of SYMPTOMATIC CHOELITHIASIS.  The various methods of treatment have been discussed with the patient and family. After consideration of risks, benefits and other options for treatment, the patient has consented to  Procedure(s): LAPAROSCOPIC CHOLECYSTECTOMY (N/A) as a surgical intervention.  The patient's history has been reviewed, patient examined, no change in status, stable for surgery.  I have reviewed the patient's chart and labs.  Questions were answered to the patient's satisfaction.     Coralie Keens

## 2021-05-23 NOTE — Anesthesia Preprocedure Evaluation (Addendum)
Anesthesia Evaluation  Patient identified by MRN, date of birth, ID band Patient awake    Reviewed: Allergy & Precautions, NPO status , Patient's Chart, lab work & pertinent test results  Airway Mallampati: II  TM Distance: >3 FB     Dental   Pulmonary    breath sounds clear to auscultation       Cardiovascular hypertension, + Valvular Problems/Murmurs  Rhythm:Regular Rate:Normal     Neuro/Psych  Headaches, PSYCHIATRIC DISORDERS CVA    GI/Hepatic Neg liver ROS, History noted Dr. Nyoka Cowden   Endo/Other  diabetesHypothyroidism   Renal/GU Renal disease     Musculoskeletal  (+) Arthritis ,   Abdominal   Peds  Hematology   Anesthesia Other Findings   Reproductive/Obstetrics                             Anesthesia Physical Anesthesia Plan  ASA: 3  Anesthesia Plan: General   Post-op Pain Management:    Induction: Intravenous  PONV Risk Score and Plan: 3 and Ondansetron, Dexamethasone and Midazolam  Airway Management Planned: Oral ETT  Additional Equipment:   Intra-op Plan:   Post-operative Plan: Extubation in OR  Informed Consent: I have reviewed the patients History and Physical, chart, labs and discussed the procedure including the risks, benefits and alternatives for the proposed anesthesia with the patient or authorized representative who has indicated his/her understanding and acceptance.     Dental advisory given  Plan Discussed with: CRNA and Anesthesiologist  Anesthesia Plan Comments:        Anesthesia Quick Evaluation

## 2021-05-23 NOTE — Anesthesia Postprocedure Evaluation (Signed)
Anesthesia Post Note  Patient: [name]  Procedure(s) Performed: LAPAROSCOPIC CHOLECYSTECTOMY (Abdomen)     Patient location during evaluation: PACU Anesthesia Type: General Level of consciousness: awake Pain management: pain level controlled Vital Signs Assessment: post-procedure vital signs reviewed and stable Respiratory status: spontaneous breathing Cardiovascular status: stable Postop Assessment: no apparent nausea or vomiting Anesthetic complications: no   No notable events documented.  Last Vitals:  Vitals:   05/23/21 1135 05/23/21 1240  BP: (!) 159/84 (!) 154/67  Pulse: 70 68  Resp: 17 17  Temp:  (!) 36.4 C  SpO2: 96% 96%    Last Pain:  Vitals:   05/23/21 1240  TempSrc: Oral  PainSc:                  Avien Taha

## 2021-05-23 NOTE — Op Note (Addendum)
LAPAROSCOPIC CHOLECYSTECTOMY  Procedure Note  [name] 05/23/2021   Pre-op Diagnosis: SYMPTOMATIC CHOLELITHIASIS     Post-op Diagnosis: ACUTE CHOLECYSTITIS WITH CHOLELITHIASIS  Procedure(s): LAPAROSCOPIC CHOLECYSTECTOMY  Surgeon(s): Coralie Keens, MD  Anesthesia: General  Staff:  Circulator: Carron Curie, RN Physician Assistant: Winferd Humphrey, PA-C Scrub Person: Lovett Sox, CST; Loney Hering  Estimated Blood Loss: Minimal               Specimens: sent to path  Indications: This is an 80 year old female who presented last week to the emergency department with abdominal pain.  She was found on CT scan to have cholecystitis and elevated white blood count.  Her liver function tests were normal.  She was feeling better so she was discharged home with arrange follow-up with general surgery.  She presented to our office yesterday still having abdominal complaints and tenderness therefore the decision was made to proceed urgently to the operating room for a cholecystectomy  Findings: The patient is found to have an acutely inflamed, distended gallbladder which was quite woody and firm.  Procedure: The patient is brought to operating identifies correct patient.  She is placed upon the operating table general anesthesia was induced.  Her abdomen was then prepped and draped in usual sterile fashion.  I made a small vertical incision below the umbilicus with a scalpel.  I then carried this down to the fascia which was then opened with a scalpel.  A hemostat was then used to pass to the peritoneal cavity under direct vision.  A 0 Vicryl pursing suture was placed around the fascial opening.  The [facility] port was placed to the opening and insufflation of the abdomen was begun.  I To 5 mm trocar in the patient's epigastrium and 2 more in the right upper quadrant all under direct vision.  The gallbladder is found to be acutely distended and inflamed with signs of impending  necrosis.  I had to aspirate bile from the gallbladder in order to facilitate grasping it.  I was able to grasp the gallbladder.  There was a large stone in the gallbladder as well as another in the neck of the gallbladder.  I was able to dissect out the cystic duct and achieved a critical window around it.  I placed 3 clips proximally and 1 distally and transected it.  The cystic artery was identified and clipped proximally distally and transected as well.  The gallbladder was then slowly dissected free from the liver bed with electrocautery.  Once the gallbladder was freed from the liver bed it was placed in an Endosac and removed through the incision at the umbilicus.  I then copiously irrigated the abdomen normal saline.  Hemostasis appeared to be achieved.  I reevaluated the clips and they were all in place.  We then tied the suture at the umbilicus close closing the fascial defect once the port was removed.  After irrigating the abdomen all ports were removed under direct vision.  I placed another figure-of-eight 0 Vicryl suture at the fascial opening at the umbilicus.  All incisions were then anesthetized Marcaine and closed with 4-0 Monocryl.  Dermabond was then applied.  Patient tolerated the procedure well.  All counts were correct at the end of the procedure.  She was then extubated in the operating room and taken in stable condition to the recovery room.          Coralie Keens   Date: 05/23/2021  Time: 10:12 AM

## 2021-05-23 NOTE — Anesthesia Procedure Notes (Signed)
Procedure Name: Intubation Date/Time: 05/23/2021 9:17 AM Performed by: Cleda Daub, CRNA Pre-anesthesia Checklist: Patient identified, Emergency Drugs available, Suction available and Patient being monitored Patient Re-evaluated:Patient Re-evaluated prior to induction Oxygen Delivery Method: Circle system utilized Preoxygenation: Pre-oxygenation with 100% oxygen Induction Type: IV induction, Rapid sequence and Cricoid Pressure applied Laryngoscope Size: Mac and 3 Grade View: Grade I Tube type: Oral Tube size: 7.0 mm Number of attempts: 1 Airway Equipment and Method: Stylet and Oral airway Placement Confirmation: ETT inserted through vocal cords under direct vision, positive ETCO2 and breath sounds checked- equal and bilateral Secured at: 21 cm Tube secured with: Tape Dental Injury: Teeth and Oropharynx as per pre-operative assessment

## 2021-05-23 NOTE — Transfer of Care (Signed)
Immediate Anesthesia Transfer of Care Note  Patient: [name]  Procedure(s) Performed: LAPAROSCOPIC CHOLECYSTECTOMY (Abdomen)  Patient Location: PACU  Anesthesia Type:General  Level of Consciousness: awake, alert , oriented and patient cooperative  Airway & Oxygen Therapy: Patient Spontanous Breathing and Patient connected to face mask oxygen  Post-op Assessment: Report given to RN and Post -op Vital signs reviewed and stable  Post vital signs: Reviewed and stable  Last Vitals:  Vitals Value Taken Time  BP 175/82 05/23/21 1035  Temp 36.5 C 05/23/21 1035  Pulse 80 05/23/21 1041  Resp 15 05/23/21 1041  SpO2 100 % 05/23/21 1041  Vitals shown include unvalidated device data.  Last Pain:  Vitals:   05/23/21 1035  TempSrc:   PainSc: Asleep         Complications: No notable events documented.

## 2021-05-24 ENCOUNTER — Encounter (HOSPITAL_COMMUNITY): Payer: Self-pay | Admitting: Surgery

## 2021-05-24 DIAGNOSIS — K8012 Calculus of gallbladder with acute and chronic cholecystitis without obstruction: Secondary | ICD-10-CM | POA: Diagnosis not present

## 2021-05-24 DIAGNOSIS — Z7982 Long term (current) use of aspirin: Secondary | ICD-10-CM | POA: Diagnosis not present

## 2021-05-24 DIAGNOSIS — Z9049 Acquired absence of other specified parts of digestive tract: Secondary | ICD-10-CM

## 2021-05-24 DIAGNOSIS — E119 Type 2 diabetes mellitus without complications: Secondary | ICD-10-CM | POA: Diagnosis not present

## 2021-05-24 DIAGNOSIS — Z20822 Contact with and (suspected) exposure to covid-19: Secondary | ICD-10-CM | POA: Diagnosis not present

## 2021-05-24 DIAGNOSIS — E039 Hypothyroidism, unspecified: Secondary | ICD-10-CM | POA: Diagnosis not present

## 2021-05-24 DIAGNOSIS — I10 Essential (primary) hypertension: Secondary | ICD-10-CM | POA: Diagnosis not present

## 2021-05-24 HISTORY — DX: Acquired absence of other specified parts of digestive tract: Z90.49

## 2021-05-24 LAB — COMPREHENSIVE METABOLIC PANEL
ALT: 24 U/L (ref 0–44)
AST: 30 U/L (ref 15–41)
Albumin: 3 g/dL — ABNORMAL LOW (ref 3.5–5.0)
Alkaline Phosphatase: 64 U/L (ref 38–126)
Anion gap: 7 (ref 5–15)
BUN: 9 mg/dL (ref 8–23)
CO2: 28 mmol/L (ref 22–32)
Calcium: 8.8 mg/dL — ABNORMAL LOW (ref 8.9–10.3)
Chloride: 104 mmol/L (ref 98–111)
Creatinine, Ser: 0.56 mg/dL (ref 0.44–1.00)
GFR, Estimated: >60 mL/min (ref >60)
Glucose, Bld: 124 mg/dL — ABNORMAL HIGH (ref 70–99)
Potassium: 3.8 mmol/L (ref 3.5–5.1)
Sodium: 139 mmol/L (ref 135–145)
Total Bilirubin: 0.5 mg/dL (ref 0.3–1.2)
Total Protein: 6 g/dL — ABNORMAL LOW (ref 6.5–8.1)

## 2021-05-24 LAB — CBC
HCT: 30.7 % — ABNORMAL LOW (ref 36.0–46.0)
Hemoglobin: 10.8 g/dL — ABNORMAL LOW (ref 12.0–15.0)
MCH: 31.8 pg (ref 26.0–34.0)
MCHC: 35.2 g/dL (ref 30.0–36.0)
MCV: 90.3 fL (ref 80.0–100.0)
Platelets: 316 10*3/uL (ref 150–400)
RBC: 3.4 MIL/uL — ABNORMAL LOW (ref 3.87–5.11)
RDW: 12 % (ref 11.5–15.5)
WBC: 12.6 10*3/uL — ABNORMAL HIGH (ref 4.0–10.5)
nRBC: 0 % (ref 0.0–0.2)

## 2021-05-24 LAB — GLUCOSE, CAPILLARY: Glucose-Capillary: 122 mg/dL — ABNORMAL HIGH (ref 70–99)

## 2021-05-24 LAB — SURGICAL PATHOLOGY

## 2021-05-24 MED ORDER — DOCUSATE SODIUM 100 MG PO CAPS: 100.0000 mg | ORAL_CAPSULE | Freq: Two times a day (BID) | ORAL | Status: DC | PRN

## 2021-05-24 MED ORDER — METHOCARBAMOL 500 MG PO TABS: 500.0000 mg | ORAL_TABLET | Freq: Four times a day (QID) | ORAL | 0 refills | Status: AC | PRN

## 2021-05-24 MED ORDER — ACETAMINOPHEN 500 MG PO TABS: 1000.0000 mg | ORAL_TABLET | Freq: Four times a day (QID) | ORAL | Status: AC

## 2021-05-24 MED ORDER — POLYETHYLENE GLYCOL 3350 17 G PO PACK
17.0000 g | PACK | Freq: Every day | ORAL | 0 refills | Status: DC | PRN
Start: 2021-05-24 — End: 2023-04-01

## 2021-05-24 MED ORDER — OXYCODONE HCL 5 MG PO TABS: 5.0000 mg | ORAL_TABLET | Freq: Four times a day (QID) | ORAL | 0 refills | Status: AC | PRN

## 2021-05-24 NOTE — Discharge Summary (Signed)
[facility] Discharge Summary   Patient ID: [name] MRN: [id number] DOB/AGE: [birth year] 80 y.o.  Admit date: 05/23/2021 Discharge date: 05/24/2021  Admitting Diagnosis: Cholecystitis, acute with cholelithiasis [K80.00]  Discharge Diagnosis Cholecystitis, acute with cholelithiasis [K80.00] s/p laparoscopic cholecystectomy   Consultants None  Imaging: No results found.  Procedures Dr. Ninfa Linden (05/23/21) - Laparoscopic Cholecystectomy  Hospital Course:  80 year old female who presented to [facility] for laparoscopic cholecystectomy after being evaluated in clinic and found to have acute cholecystitis with cholelithiasis. Patient underwent procedure listed above and was subsequently admitted to observation overnight.  Tolerated procedure well and was transferred to the floor.  Diet was advanced as tolerated.  On POD1, the patient was voiding well, tolerating diet, ambulating well, pain well controlled, vital signs stable, incisions c/d/i and felt stable for discharge home.  Patient will follow up in our office in 3 weeks and knows to call with questions or concerns.  She was prescribed oxycodone and robaxin to alternate with tylenol for pain control. She has had itchiness with oxycodone in the past but none so far. She has tramadol at home and was instructed to only use opioid pain medication every 6 hours.  Physical Exam: General:  Alert, NAD, pleasant, comfortable Resp: effort nonlabored on room air Heart: regular rate and rhythm. Palpable radial pulses bilaterally Abd:  Soft, ND, mild tenderness, incisions C/D/I  I or a member of my team have reviewed this patient in the Controlled Substance Database.   Allergies as of 05/24/2021       Reactions   Conjugated Estrogens    REACTION: weight gain  & fibrocystic breast disease changes   Percocet [oxycodone-acetaminophen] Itching   Patient can take regular tylenol with no problem.   Detrol [tolterodine]     Rxed by [facility] Excessive drying        Medication List     TAKE these medications    acetaminophen 500 MG tablet Commonly known as: TYLENOL Take 2 tablets (1,000 mg total) by mouth every 6 (six) hours for 3 days.   aspirin 81 MG chewable tablet Chew 81 mg by mouth at bedtime.   aspirin-acetaminophen-caffeine 250-250-65 MG tablet Commonly known as: EXCEDRIN MIGRAINE Take 1 tablet by mouth 2 (two) times daily as needed for headache.   benazepril 20 MG tablet Commonly known as: LOTENSIN TAKE 1 TABLET DAILY What changed: when to take this   blood glucose meter kit and supplies Kit Dispense based on patient and insurance preference. Use up to four times daily as directed. (FOR E11.9).   CALCIUM CITRATE + D PO Take 1 tablet by mouth at bedtime.   cephALEXin 250 MG capsule Commonly known as: KEFLEX Take 250 mg by mouth every other day. At bedtime   CRANBERRY PO Take 1 capsule by mouth at bedtime.   cycloSPORINE 0.05 % ophthalmic emulsion Commonly known as: RESTASIS Place 1 drop into both eyes 2 (two) times daily.   diazepam 5 MG tablet Commonly known as: VALIUM TAKE 1 TABLET EVERY 12 HOURS AS NEEDED FOR ANXIETY What changed: See the new instructions.   docusate sodium 100 MG capsule Commonly known as: COLACE Take 1 capsule (100 mg total) by mouth 2 (two) times daily as needed for mild constipation.   escitalopram 10 MG tablet Commonly known as: LEXAPRO TAKE 1 TABLET DAILY   estradiol 0.1 MG/GM vaginal cream Commonly known as: ESTRACE Place 1 Applicatorful vaginally every three (3) days as needed (vaginal dryness).   metFORMIN  500 MG 24 hr tablet Commonly known as: GLUCOPHAGE-XR TAKE 2 TABLETS WITH BREAKFAST AND 1 TABLET WITH DINNER   methocarbamol 500 MG tablet Commonly known as: ROBAXIN Take 1 tablet (500 mg total) by mouth every 6 (six) hours as needed for up to 3 days for muscle spasms (abdominal pain).   methylcellulose 1 % ophthalmic  solution Commonly known as: ARTIFICIAL TEARS Place 1 drop into both eyes daily as needed (itching eyes).   multivitamin with minerals Tabs tablet Take 1 tablet by mouth daily. One A Day for Women   ondansetron 4 MG tablet Commonly known as: ZOFRAN Take 1 tablet (4 mg total) by mouth every 6 (six) hours as needed for nausea or vomiting.   oxyCODONE 5 MG immediate release tablet Commonly known as: Oxy IR/ROXICODONE Take 1 tablet (5 mg total) by mouth every 6 (six) hours as needed for up to 5 days for severe pain or moderate pain. DO not take with tramadol. Only take opioid pain medication every 6 hours   polyethylene glycol 17 g packet Commonly known as: MIRALAX / GLYCOLAX Take 17 g by mouth daily as needed for moderate constipation or severe constipation.   pravastatin 40 MG tablet Commonly known as: PRAVACHOL TAKE 1 TABLET AT BEDTIME   Synthroid 50 MCG tablet Generic drug: levothyroxine TAKE 1 TABLET DAILY   traMADol 50 MG tablet Commonly known as: ULTRAM Take 1 tablet (50 mg total) by mouth every 6 (six) hours as needed.   Trulicity 3 UG/6.4GE Sopn Generic drug: Dulaglutide Inject 3 mg as directed once a week. What changed: when to take this   Vitamin B-12 5000 MCG Tbdp Take 10,000 mcg by mouth in the morning.   Vitamin D3 50 MCG (2000 UT) Tabs Take 2,000 Units by mouth at bedtime.          Follow-up [facility], [place]. Go on 06/13/2021.   Specialty: General Surgery Why: follow up on 11/22 at 8:30am, please arrive 15 minutes early to complete check in process and bring photo ID and insurance card if you have them Contact information: [address] [place] [phone]                Signed: Caroll Rancher [facility] 05/24/2021, 8:44 AM Please see Amion for pager number during day hours 7:00am-4:30pm

## 2021-06-01 DIAGNOSIS — N3020 Other chronic cystitis without hematuria: Secondary | ICD-10-CM | POA: Diagnosis not present

## 2021-06-03 ENCOUNTER — Other Ambulatory Visit: Payer: Self-pay | Admitting: Internal Medicine

## 2021-06-05 ENCOUNTER — Telehealth: Payer: Self-pay | Admitting: Internal Medicine

## 2021-06-05 ENCOUNTER — Other Ambulatory Visit: Payer: Self-pay

## 2021-06-05 MED ORDER — TRULICITY 3 MG/0.5ML ~~LOC~~ SOAJ: 3.0000 mg | SUBCUTANEOUS | 2 refills | Status: DC

## 2021-06-05 NOTE — Telephone Encounter (Signed)
1.Medication Requested: Dulaglutide (TRULICITY) 3 JW/9.2HV SOPN  2. Pharmacy (Name, [place]): [facility] [place], [place] [facility] [place]  Phone:  [phone] Fax:  [phone]   3. On Med List: yes  4. Last Visit with PCP: 10.26.22  5. Next visit date with PCP: 03.02.23   Agent: Please be advised that RX refills may take up to 3 business days. We ask that you follow-up with your pharmacy.

## 2021-06-05 NOTE — Telephone Encounter (Signed)
Sent in today 

## 2021-06-20 DIAGNOSIS — Z23 Encounter for immunization: Secondary | ICD-10-CM | POA: Diagnosis not present

## 2021-07-12 ENCOUNTER — Encounter: Payer: Self-pay | Admitting: Internal Medicine

## 2021-07-19 ENCOUNTER — Other Ambulatory Visit: Payer: Self-pay

## 2021-07-19 ENCOUNTER — Encounter (HOSPITAL_BASED_OUTPATIENT_CLINIC_OR_DEPARTMENT_OTHER): Payer: Self-pay | Admitting: *Deleted

## 2021-07-19 ENCOUNTER — Emergency Department (HOSPITAL_BASED_OUTPATIENT_CLINIC_OR_DEPARTMENT_OTHER)
Admission: EM | Admit: 2021-07-19 | Discharge: 2021-07-19 | Disposition: A | Discharge status: Home / Self Care | Payer: Medicare Other | Attending: Emergency Medicine | Admitting: Emergency Medicine

## 2021-07-19 DIAGNOSIS — R350 Frequency of micturition: Secondary | ICD-10-CM | POA: Diagnosis not present

## 2021-07-19 DIAGNOSIS — Z5321 Procedure and treatment not carried out due to patient leaving prior to being seen by health care provider: Secondary | ICD-10-CM | POA: Insufficient documentation

## 2021-07-19 LAB — URINALYSIS, MICROSCOPIC (REFLEX)

## 2021-07-19 LAB — URINALYSIS, ROUTINE W REFLEX MICROSCOPIC
Bilirubin Urine: NEGATIVE
Glucose, UA: NEGATIVE mg/dL
Ketones, ur: NEGATIVE mg/dL
Nitrite: NEGATIVE
Protein, ur: NEGATIVE mg/dL
Specific Gravity, Urine: >=1.03 (ref 1.005–1.030)
pH: 5.5 (ref 5.0–8.0)

## 2021-07-19 NOTE — ED Triage Notes (Signed)
C/o painful freq urination x 1 week

## 2021-07-20 LAB — URINE CULTURE: Culture: NO GROWTH

## 2021-07-22 ENCOUNTER — Telehealth: Payer: Self-pay | Admitting: Internal Medicine

## 2021-07-25 DIAGNOSIS — N3020 Other chronic cystitis without hematuria: Secondary | ICD-10-CM | POA: Diagnosis not present

## 2021-08-14 NOTE — Progress Notes (Signed)
Subjective:    Patient ID: [name], female    DOB: [birth year], 81 y.o.   MRN: [id number]  This visit occurred during the SARS-CoV-2 public health emergency.  Safety protocols were in place, including screening questions prior to the visit, additional usage of staff PPE, and extensive cleaning of exam room while observing appropriate contact time as indicated for disinfecting solutions.    HPI She is here for an acute visit for cold symptoms.   Her symptoms started 10 days  She is experiencing   She has tried taking nyquil and dayquil   She as been having loose stools.   a lot of times at night especially she gets leaking from her rectum this started 2-3 weeks ago.     She does have some urinary incontinence  Medications and allergies reviewed with patient and updated if appropriate.  Patient Active Problem List   Diagnosis Date Noted   History of laparoscopic cholecystectomy 05/24/2021   Calculus of gallbladder with acute cholecystitis without obstruction 05/17/2021   Tingling 03/16/2021   Vitamin B12 deficiency 03/16/2021   Concussion 02/24/2021   Head injury due to trauma 02/23/2021   Acute bronchitis 10/24/2020   Low back pain 10/29/2019   Pain in left wrist 10/01/2019   Cervicalgia 07/29/2019   Trigger finger, left middle finger 04/28/2018   Hypothyroidism 03/29/2017   Generalized anxiety disorder 06/20/2015   SUI (stress urinary incontinence, female) 02/05/2014   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   Cerebral artery occlusion with cerebral infarction ([place]) 05/31/2010   DM (diabetes mellitus), secondary, uncontrolled, with renal complications ([place]) 42/39/5320   THYROID NODULE, RIGHT 01/21/2009   Vitamin D deficiency 12/10/2007   HYPERLIPIDEMIA 06/17/2007   Essential hypertension 06/17/2007   Osteopenia 06/17/2007   Osteoarthrosis, unspecified whether generalized or localized, unspecified site 11/11/2006    Current Outpatient Medications on File Prior  to Visit  Medication Sig Dispense Refill   aspirin 81 MG chewable tablet Chew 81 mg by mouth at bedtime.     aspirin-acetaminophen-caffeine (EXCEDRIN MIGRAINE) 250-250-65 MG tablet Take 1 tablet by mouth 2 (two) times daily as needed for headache.     benazepril (LOTENSIN) 20 MG tablet TAKE 1 TABLET DAILY 90 tablet 3   blood glucose meter kit and supplies KIT Dispense based on patient and insurance preference. Use up to four times daily as directed. (FOR E11.9). 1 each 0   Calcium Citrate-Vitamin D (CALCIUM CITRATE + D PO) Take 1 tablet by mouth at bedtime.     cephALEXin (KEFLEX) 250 MG capsule Take 250 mg by mouth every other day. At bedtime     Cholecalciferol (VITAMIN D3) 50 MCG (2000 UT) TABS Take 2,000 Units by mouth at bedtime.     CRANBERRY PO Take 1 capsule by mouth at bedtime.     Cyanocobalamin (VITAMIN B-12) 5000 MCG TBDP Take 10,000 mcg by mouth in the morning.     cycloSPORINE (RESTASIS) 0.05 % ophthalmic emulsion Place 1 drop into both eyes 2 (two) times daily.     diazepam (VALIUM) 5 MG tablet Take 1 tablet (5 mg total) by mouth at bedtime. 30 tablet 2   docusate sodium (COLACE) 100 MG capsule Take 1 capsule (100 mg total) by mouth 2 (two) times daily as needed for mild constipation.     Dulaglutide (TRULICITY) 3 EB/3.4DH SOPN Inject 3 mg as directed once a week. 6 mL 2   escitalopram (LEXAPRO) 10 MG tablet TAKE 1 TABLET DAILY 90 tablet 3  estradiol (ESTRACE) 0.1 MG/GM vaginal cream Place 1 Applicatorful vaginally every three (3) days as needed (vaginal dryness).     metFORMIN (GLUCOPHAGE-XR) 500 MG 24 hr tablet TAKE 2 TABLETS WITH BREAKFAST AND 1 TABLET WITH DINNER 270 tablet 3   methylcellulose (ARTIFICIAL TEARS) 1 % ophthalmic solution Place 1 drop into both eyes daily as needed (itching eyes).      Multiple Vitamin (MULTIVITAMIN WITH MINERALS) TABS tablet Take 1 tablet by mouth daily. One A Day for Women     ondansetron (ZOFRAN) 4 MG tablet Take 1 tablet (4 mg total) by mouth  every 6 (six) hours as needed for nausea or vomiting. 20 tablet 0   polyethylene glycol (MIRALAX / GLYCOLAX) 17 g packet Take 17 g by mouth daily as needed for moderate constipation or severe constipation.  0   pravastatin (PRAVACHOL) 40 MG tablet TAKE 1 TABLET AT BEDTIME 90 tablet 3   SYNTHROID 50 MCG tablet TAKE 1 TABLET DAILY 90 tablet 3   traMADol (ULTRAM) 50 MG tablet Take 1 tablet (50 mg total) by mouth every 6 (six) hours as needed. 30 tablet 0   nitrofurantoin (MACRODANTIN) 50 MG capsule Take 50 mg by mouth at bedtime.     No current facility-administered medications on file prior to visit.    Past Medical History:  Diagnosis Date   Anxiety    Arthritis HANDS, NECK , BACK   Asymptomatic carotid artery stenosis LEFT --  MOD. PER DR WILLIS NOTE OCT 2012   Benign heart murmur    Echo 2011 in [place]   Depression    PMH of   Diabetes mellitus [facility]   DVT (deep venous thrombosis) ([place]) 2006   after arthroscopy   Frequency of urination    Headache    History of CVA (cerebrovascular accident) NEUROLOGIST  DR WILLIS----  06-01-2010-  LEFT BASAL GANGLIA HEMORRAGE   RESIDUAL RIGHT HAND NUMBNESS/TINGLING   Hyperlipidemia    Hypertension    Hypothyroid    MRSA (methicillin resistant staph aureus) culture positive    X3; last post TKR   Nocturia    Numbness and tingling in right hand RESIDUAL FROM CVA NOV 2011   Obesity    Redux therapy   Osteoporosis    Stroke [facility])    Thyroid disease    right thyroid nodule-- BX DONE 08-15-2011    Past Surgical History:  Procedure Laterality Date   ABDOMINAL HYSTERECTOMY  1973   Endometriosis & fibroid   CARPAL TUNNEL RELEASE  2000   RIGHT   CHOLECYSTECTOMY N/A 05/23/2021   Procedure: LAPAROSCOPIC CHOLECYSTECTOMY;  Surgeon: Coralie Keens, MD;  Location: [facility];  Service: General;  Laterality: N/A;   COLONOSCOPY  1999 & 2005   Dr Olevia Perches   cystoscope     to evaluate recurrent UTIs   CYSTOSCOPY W/ RETROGRADES  08/22/2011   Procedure:  CYSTOSCOPY WITH RETROGRADE PYELOGRAM;  Surgeon: Molli Hazard, MD;  Location: [facility];  Service: Urology;  Laterality: Bilateral;   CYSTOSCOPY WITH BIOPSY  08/22/2011   Procedure: CYSTOSCOPY WITH BIOPSY;  Surgeon: Molli Hazard, MD;  Location[facility];  Service: Urology;  Laterality: N/A;   g2 p2     JOINT REPLACEMENT  03-28-2006   LEFT THUMB   KNEE ARTHROSCOPY     BILATERAL PRIOR TO TOTAL KNEE   LEFT SHOULDER SURG  1990'S   LUMBAR LAMINECTOMY  08-26-2009   L 4 - 5   THYROID NODULE BX  08-15-2011   RIGHT   TOTAL KNEE ARTHROPLASTY  05-26-2002      LEFT   AND RIGHT  03-30-2098   WRIST SURGERY  2014   tendon placed in joint; Dr Burney Gauze    Social History   Socioeconomic History   Marital status: Widowed    Spouse name: Not on file   Number of children: 2   Years of education: Not on file   Highest education level: Not on file  Occupational History   Not on file  Tobacco Use   Smoking status: Never   Smokeless tobacco: Never  Vaping Use   Vaping Use: Never used  Substance and Sexual Activity   Alcohol use: Yes    Comment:  very rarely , < 1 glass wine / month   Drug use: No   Sexual activity: Not Currently    Birth control/protection: Surgical  Other Topics Concern   Not on file  Social History Narrative   Not on file   Social Determinants of Health   Financial Resource Strain: Low Risk    Difficulty of Paying Living Expenses: Not hard at all  Food Insecurity: No Food Insecurity   Worried About Charity fundraiser in the Last Year: Never true   [place] in the Last Year: Never true  Transportation Needs: No Transportation Needs   Lack of Transportation (Medical): No   Lack of Transportation (Non-Medical): No  Physical Activity: Sufficiently Active   Days of Exercise per Week: 3 days   Minutes of Exercise per Session: 60 min  Stress: No Stress Concern Present   Feeling of Stress : Not at all  Social  Connections: Moderately Integrated   Frequency of Communication with Friends and Family: More than three times a week   Frequency of Social Gatherings with Friends and Family: More than three times a week   Attends Religious Services: More than 4 times per year   Active Member of [employer] or Organizations: Yes   Attends Archivist Meetings: More than 4 times per year   Marital Status: Widowed    Family History  Problem Relation Age of Onset   Bladder Cancer Mother    Diabetes Mother    Brain cancer Sister    Stroke Paternal Grandfather        in 46s   Diabetes Sister 25       TYPE 1    Prostate cancer Brother    Alzheimer's disease Brother    Diabetes Maternal Aunt    Diabetes Maternal Uncle    Diabetes Maternal Grandmother        Type 2   Diabetes Maternal Grandfather        Type 2   Pancreatic cancer Sister    Kidney failure Sister        in context of DM & influenza   Heart disease Neg Hx     Review of Systems  Constitutional:  Positive for fatigue. Negative for fever.  HENT:  Positive for congestion, ear pain (right ear), rhinorrhea, sinus pressure (mild) and sore throat. Negative for sinus pain.   Respiratory:  Positive for cough (productive), shortness of breath (mild) and wheezing (mild).   Gastrointestinal:  Negative for abdominal pain, blood in stool and nausea.  Musculoskeletal:  Negative for myalgias.  Neurological:  Positive for dizziness (occ, chronic) and headaches.      Objective:   Vitals:   08/15/21 1545  BP: 126/80  Pulse: 75  Temp:  98.6 F (37 C)  SpO2: 95%   BP Readings from Last 3 Encounters:  08/15/21 126/80  07/19/21 133/88  05/24/21 (!) 166/66   Wt Readings from Last 3 Encounters:  08/15/21 155 lb 9.6 oz (70.6 kg)  07/19/21 150 lb (68 kg)  05/23/21 158 lb 11.7 oz (72 kg)   Body mass index is 25.11 kg/m.   Physical Exam    GENERAL APPEARANCE: Appears stated age, well appearing, NAD EYES: conjunctiva clear, no  icterus HENT: bilateral tympanic membranes and ear canals normal, oropharynx with no erythema or exudates, trachea midline, no cervical or supraclavicular lymphadenopathy LUNGS: Unlabored breathing, good air entry bilaterally, clear to auscultation without wheeze or crackles CARDIOVASCULAR: Normal S1,S2 , no edema SKIN: Warm, dry      Assessment & Plan:    See Problem List for Assessment and Plan of chronic medical problems.

## 2021-08-15 ENCOUNTER — Other Ambulatory Visit: Payer: Self-pay

## 2021-08-15 ENCOUNTER — Ambulatory Visit (INDEPENDENT_AMBULATORY_CARE_PROVIDER_SITE_OTHER): Payer: Medicare Other | Admitting: Internal Medicine

## 2021-08-15 ENCOUNTER — Encounter: Payer: Self-pay | Admitting: Internal Medicine

## 2021-08-15 DIAGNOSIS — R159 Full incontinence of feces: Secondary | ICD-10-CM

## 2021-08-15 DIAGNOSIS — J069 Acute upper respiratory infection, unspecified: Secondary | ICD-10-CM | POA: Insufficient documentation

## 2021-08-15 MED ORDER — CEFDINIR 300 MG PO CAPS: 300.0000 mg | ORAL_CAPSULE | Freq: Two times a day (BID) | ORAL | 0 refills | Status: DC

## 2021-08-15 MED ORDER — TRULICITY 1.5 MG/0.5ML ~~LOC~~ SOAJ: 1.5000 mg | SUBCUTANEOUS | 5 refills | Status: DC

## 2021-08-15 MED ORDER — METFORMIN HCL ER 500 MG PO TB24
1000.0000 mg | ORAL_TABLET | Freq: Two times a day (BID) | ORAL | 3 refills | Status: DC
Start: 2021-08-15 — End: 2022-01-19

## 2021-08-15 NOTE — Assessment & Plan Note (Signed)
Acute She has had that in the past Does have chronic diarrhea-partial colectomy in the past For the past 2-3 weeks has noticed mild fecal incontinence, especially at night Stool is loose ?  Related to increasing metformin dose not too long ago-we will try to decrease that to see if that helps Also has some urinary incontinence and has done pelvic PT in the past-recommended considering doing pelvic PT again Can consider GI referral

## 2021-08-15 NOTE — Patient Instructions (Addendum)
° ° °  Medications changes include :   trulicity 1.5 mg weekly, cefdinir for your cold    Your prescription(s) have been submitted to your pharmacy. Please take as directed and contact our office if you believe you are having problem(s) with the medication(s).

## 2021-08-15 NOTE — Assessment & Plan Note (Signed)
Chronic On Trulicity, but has not been able to get the prescription for approximately 1 month-we will increase metformin to 2 pills twice daily and that may be causing some loose stool Will resend Trulicity to pharmacy at 1.5 mg weekly-hopefully they have the medication Continue to monitor sugars and when sugars become more controlled we will decrease metformin May need to consider Rybelsus if Trulicity is not available May need to add second medication so that we can further decrease metformin

## 2021-08-15 NOTE — Assessment & Plan Note (Signed)
Acute Symptoms for 10 days with wet sounding cough, occasional wheeze and mild shortness of breath along with typical cold symptoms Not improving with conservative measures Will start cefdinir 300 mg twice daily x10 days Continue over-the-counter medication for symptom relief

## 2021-09-12 DIAGNOSIS — H43813 Vitreous degeneration, bilateral: Secondary | ICD-10-CM | POA: Diagnosis not present

## 2021-09-12 DIAGNOSIS — H35373 Puckering of macula, bilateral: Secondary | ICD-10-CM | POA: Diagnosis not present

## 2021-09-12 DIAGNOSIS — H35341 Macular cyst, hole, or pseudohole, right eye: Secondary | ICD-10-CM | POA: Diagnosis not present

## 2021-09-12 DIAGNOSIS — H26493 Other secondary cataract, bilateral: Secondary | ICD-10-CM | POA: Diagnosis not present

## 2021-09-20 ENCOUNTER — Encounter: Payer: Self-pay | Admitting: Internal Medicine

## 2021-09-20 NOTE — Progress Notes (Signed)
? ? ? ? ?Subjective:  ? ? Patient ID: [name], female    DOB: 04-09-1941, 81 y.o.   MRN: [id number] ? ?This visit occurred during the SARS-CoV-2 public health emergency.  Safety protocols were in place, including screening questions prior to the visit, additional usage of staff PPE, and extensive cleaning of exam room while observing appropriate contact time as indicated for disinfecting solutions.   ? ? ?HPI ?[name] is here for follow up of her chronic medical problems, including htn, DM, hypothyroid, hld, anxiety ? ? ?Not doing Aerobics right now ? ?About one week ago got two ticks off of her abdomen - she was outside and was itching.   ? ?Medications and allergies reviewed with patient and updated if appropriate. ? ?Current Outpatient Medications on File Prior to Visit  ?Medication Sig Dispense Refill  ? aspirin 81 MG chewable tablet Chew 81 mg by mouth at bedtime.    ? aspirin-acetaminophen-caffeine (EXCEDRIN MIGRAINE) 250-250-65 MG tablet Take 1 tablet by mouth 2 (two) times daily as needed for headache.    ? benazepril (LOTENSIN) 20 MG tablet TAKE 1 TABLET DAILY 90 tablet 3  ? blood glucose meter kit and supplies KIT Dispense based on patient and insurance preference. Use up to four times daily as directed. (FOR E11.9). 1 each 0  ? Calcium Citrate-Vitamin D (CALCIUM CITRATE + D PO) Take 1 tablet by mouth at bedtime.    ? cephALEXin (KEFLEX) 250 MG capsule Take 250 mg by mouth every other day. At bedtime    ? Cholecalciferol (VITAMIN D3) 50 MCG (2000 UT) TABS Take 2,000 Units by mouth at bedtime.    ? CRANBERRY PO Take 1 capsule by mouth at bedtime.    ? Cyanocobalamin (VITAMIN B-12) 5000 MCG TBDP Take 10,000 mcg by mouth in the morning.    ? cycloSPORINE (RESTASIS) 0.05 % ophthalmic emulsion Place 1 drop into both eyes 2 (two) times daily.    ? diazepam (VALIUM) 5 MG tablet Take 1 tablet (5 mg total) by mouth at bedtime. 30 tablet 2  ? docusate sodium (COLACE) 100 MG capsule Take 1 capsule (100 mg total)  by mouth 2 (two) times daily as needed for mild constipation.    ? escitalopram (LEXAPRO) 10 MG tablet TAKE 1 TABLET DAILY 90 tablet 3  ? estradiol (ESTRACE) 0.1 MG/GM vaginal cream Place 1 Applicatorful vaginally every three (3) days as needed (vaginal dryness).    ? metFORMIN (GLUCOPHAGE-XR) 500 MG 24 hr tablet Take 2 tablets (1,000 mg total) by mouth 2 (two) times daily with a meal. TAKE 2 TABLETS WITH BREAKFAST AND 1 TABLET WITH DINNER 360 tablet 3  ? methylcellulose (ARTIFICIAL TEARS) 1 % ophthalmic solution Place 1 drop into both eyes daily as needed (itching eyes).     ? Multiple Vitamin (MULTIVITAMIN WITH MINERALS) TABS tablet Take 1 tablet by mouth daily. One A Day for Women    ? nitrofurantoin (MACRODANTIN) 50 MG capsule Take 50 mg by mouth at bedtime.    ? ondansetron (ZOFRAN) 4 MG tablet Take 1 tablet (4 mg total) by mouth every 6 (six) hours as needed for nausea or vomiting. 20 tablet 0  ? polyethylene glycol (MIRALAX / GLYCOLAX) 17 g packet Take 17 g by mouth daily as needed for moderate constipation or severe constipation.  0  ? pravastatin (PRAVACHOL) 40 MG tablet TAKE 1 TABLET AT BEDTIME 90 tablet 3  ? SYNTHROID 50 MCG tablet TAKE 1 TABLET DAILY 90 tablet 3  ?  Cran[place] POWD Take by mouth.    ? ?No current facility-administered medications on file prior to visit.  ? ? ? ?Review of Systems  ?Constitutional:  Negative for chills and fever.  ?Respiratory:  Positive for cough (little residual cough since URI). Negative for shortness of breath and wheezing.   ?Cardiovascular:  Negative for chest pain, palpitations and leg swelling.  ?Musculoskeletal:  Positive for neck pain.  ?Neurological:  Positive for headaches (from neck). Negative for light-headedness.  ? ?   ?Objective:  ? ?Vitals:  ? 09/21/21 1106  ?BP: 124/76  ?Pulse: 82  ?Temp: 98.1 ?F (36.7 ?C)  ?SpO2: 97%  ? ?BP Readings from Last 3 Encounters:  ?09/21/21 124/76  ?08/15/21 126/80  ?07/19/21 133/88  ? ?Wt Readings from Last 3 Encounters:   ?09/21/21 159 lb 9.6 oz (72.4 kg)  ?08/15/21 155 lb 9.6 oz (70.6 kg)  ?07/19/21 150 lb (68 kg)  ? ?Body mass index is 25.76 kg/m?. ? ?  ?Physical Exam ?Constitutional:   ?   General: She is not in acute distress. ?   Appearance: Normal appearance.  ?HENT:  ?   Head: Normocephalic and atraumatic.  ?Eyes:  ?   Conjunctiva/sclera: Conjunctivae normal.  ?Cardiovascular:  ?   Rate and Rhythm: Normal rate and regular rhythm.  ?   Heart sounds: Normal heart sounds. No murmur heard. ?Pulmonary:  ?   Effort: Pulmonary effort is normal. No respiratory distress.  ?   Breath sounds: Normal breath sounds. No wheezing.  ?Musculoskeletal:  ?   Cervical back: Neck supple.  ?   Right lower leg: No edema.  ?   Left lower leg: No edema.  ?Lymphadenopathy:  ?   Cervical: No cervical adenopathy.  ?Skin: ?   Findings: No rash.  ?Neurological:  ?   Mental Status: She is alert. Mental status is at baseline.  ?Psychiatric:     ?   Mood and Affect: Mood normal.     ?   Behavior: Behavior normal.  ? ?   ? ?Lab Results  ?Component Value Date  ? WBC 12.6 (H) 05/24/2021  ? HGB 10.8 (L) 05/24/2021  ? HCT 30.7 (L) 05/24/2021  ? PLT 316 05/24/2021  ? GLUCOSE 124 (H) 05/24/2021  ? CHOL 119 03/16/2021  ? TRIG 90.0 03/16/2021  ? HDL 46.80 03/16/2021  ? LDLDIRECT 61.0 06/30/2018  ? [place] 54 03/16/2021  ? ALT 24 05/24/2021  ? AST 30 05/24/2021  ? NA 139 05/24/2021  ? K 3.8 05/24/2021  ? CL 104 05/24/2021  ? CREATININE 0.56 05/24/2021  ? BUN 9 05/24/2021  ? CO2 28 05/24/2021  ? TSH 1.85 03/16/2021  ? INR 1.8 (H) 04/02/2008  ? HGBA1C 6.1 (H) 05/23/2021  ? MICROALBUR <0.7 01/12/2015  ? ? ? ?Assessment & Plan:  ? ? ?See Problem List for Assessment and Plan of chronic medical problems.  ? ? ?

## 2021-09-20 NOTE — Patient Instructions (Addendum)
? ? ? ?  Blood work was ordered.   ? ? ?Medications changes include :   none ? ? ?Your tr[facility]iticy has [facility]  ? ? ? ?Return in about 6 months (around 03/24/2022) for follow up. ? ?

## 2021-09-21 ENCOUNTER — Ambulatory Visit (INDEPENDENT_AMBULATORY_CARE_PROVIDER_SITE_OTHER): Payer: Medicare Other | Admitting: Internal Medicine

## 2021-09-21 ENCOUNTER — Other Ambulatory Visit: Payer: Self-pay

## 2021-09-21 VITALS — BP 124/76 | HR 82 | Temp 98.1°F | Ht 66.0 in | Wt 159.6 lb

## 2021-09-21 DIAGNOSIS — E1169 Type 2 diabetes mellitus with other specified complication: Secondary | ICD-10-CM

## 2021-09-21 DIAGNOSIS — F411 Generalized anxiety disorder: Secondary | ICD-10-CM

## 2021-09-21 DIAGNOSIS — I10 Essential (primary) hypertension: Secondary | ICD-10-CM | POA: Diagnosis not present

## 2021-09-21 DIAGNOSIS — E538 Deficiency of other specified B group vitamins: Secondary | ICD-10-CM | POA: Diagnosis not present

## 2021-09-21 DIAGNOSIS — E782 Mixed hyperlipidemia: Secondary | ICD-10-CM

## 2021-09-21 DIAGNOSIS — E039 Hypothyroidism, unspecified: Secondary | ICD-10-CM

## 2021-09-21 LAB — TSH: TSH: 2.73 u[IU]/mL (ref 0.35–5.50)

## 2021-09-21 LAB — LIPID PANEL
Cholesterol: 125 mg/dL (ref 0–200)
HDL: 55.7 mg/dL (ref >39.00)
LDL Cholesterol: 46 mg/dL (ref 0–99)
NonHDL: 69.37
Total CHOL/HDL Ratio: 2
Triglycerides: 118 mg/dL (ref 0.0–149.0)
VLDL: 23.6 mg/dL (ref 0.0–40.0)

## 2021-09-21 LAB — COMPREHENSIVE METABOLIC PANEL
ALT: 12 U/L (ref 0–35)
AST: 18 U/L (ref 0–37)
Albumin: 4.2 g/dL (ref 3.5–5.2)
Alkaline Phosphatase: 38 U/L — ABNORMAL LOW (ref 39–117)
BUN: 13 mg/dL (ref 6–23)
CO2: 30 mEq/L (ref 19–32)
Calcium: 9.6 mg/dL (ref 8.4–10.5)
Chloride: 103 mEq/L (ref 96–112)
Creatinine, Ser: 0.71 mg/dL (ref 0.40–1.20)
GFR: 78.8 mL/min (ref >60.00)
Glucose, Bld: 143 mg/dL — ABNORMAL HIGH (ref 70–99)
Potassium: 3.9 mEq/L (ref 3.5–5.1)
Sodium: 140 mEq/L (ref 135–145)
Total Bilirubin: 0.5 mg/dL (ref 0.2–1.2)
Total Protein: 6.7 g/dL (ref 6.0–8.3)

## 2021-09-21 LAB — VITAMIN B12: Vitamin B-12: 685 pg/mL (ref 211–911)

## 2021-09-21 LAB — HEMOGLOBIN A1C: Hgb A1c MFr Bld: 6.5 % (ref 4.6–6.5)

## 2021-09-21 MED ORDER — TRULICITY 1.5 MG/0.5ML ~~LOC~~ SOAJ
1.5000 mg | SUBCUTANEOUS | 3 refills | Status: DC
Start: 2021-09-21 — End: 2022-08-23

## 2021-09-21 NOTE — Assessment & Plan Note (Signed)
Chronic Taking B12 daily Check B12 level 

## 2021-09-21 NOTE — Assessment & Plan Note (Signed)
Chronic Blood pressure well controlled CMP Continue benazepril 20 mg daily 

## 2021-09-21 NOTE — Assessment & Plan Note (Signed)
Chronic Regular exercise and healthy diet encouraged Check lipid panel  Continue pravastatin 40 mg daily 

## 2021-09-21 NOTE — Assessment & Plan Note (Signed)
Chronic ?Controlled, Stable ?Continue Lexapro 10 mg daily, diazepam 5 mg daily at bedtime as needed ?

## 2021-09-21 NOTE — Assessment & Plan Note (Signed)
Chronic ?Lab Results  ?Component Value Date  ? HGBA1C 6.1 (H) 05/23/2021  ? ?Controlled ?Continue metformin 1000 mg twice daily with meals, Trulicity 1.5 mg weekly ?Check A1c ?

## 2021-09-21 NOTE — Assessment & Plan Note (Signed)
Chronic  ?Clinically euthyroid ?Currently taking Synthroid 50 mcg daily ?Check tsh  ?Titrate med dose if needed ?

## 2021-10-02 MED ORDER — DIAZEPAM 5 MG PO TABS: 5.0000 mg | ORAL_TABLET | Freq: Every day | ORAL | 2 refills | Status: DC

## 2021-10-02 NOTE — Telephone Encounter (Signed)
1.Medication Requested: diazepam (VALIUM) 5 MG tablet ? ?2. Pharmacy (Name, Street, [facility]): [facility] [place], [place]  ?Phone:  [phone] ?Fax:  [phone] ? ? ?3. On Med List: yes ? ?4. Last Visit with PCP: 03.02.23 ? ?5. Next visit date with PCP: 09.05.23 ? ? ?Agent: Please be advised that RX refills may take up to 3 business days. We ask that you follow-up with your pharmacy.  ?

## 2021-11-07 DIAGNOSIS — N3020 Other chronic cystitis without hematuria: Secondary | ICD-10-CM | POA: Diagnosis not present

## 2021-12-21 DIAGNOSIS — L820 Inflamed seborrheic keratosis: Secondary | ICD-10-CM | POA: Diagnosis not present

## 2021-12-21 DIAGNOSIS — L570 Actinic keratosis: Secondary | ICD-10-CM | POA: Diagnosis not present

## 2022-01-18 DIAGNOSIS — M5412 Radiculopathy, cervical region: Secondary | ICD-10-CM | POA: Diagnosis not present

## 2022-01-19 ENCOUNTER — Other Ambulatory Visit: Payer: Self-pay | Admitting: Internal Medicine

## 2022-01-25 DIAGNOSIS — R202 Paresthesia of skin: Secondary | ICD-10-CM | POA: Diagnosis not present

## 2022-01-25 DIAGNOSIS — M4316 Spondylolisthesis, lumbar region: Secondary | ICD-10-CM | POA: Diagnosis not present

## 2022-01-25 DIAGNOSIS — M4310 Spondylolisthesis, site unspecified: Secondary | ICD-10-CM | POA: Diagnosis not present

## 2022-01-25 DIAGNOSIS — M5412 Radiculopathy, cervical region: Secondary | ICD-10-CM | POA: Diagnosis not present

## 2022-01-25 DIAGNOSIS — M545 Low back pain, unspecified: Secondary | ICD-10-CM | POA: Diagnosis not present

## 2022-01-25 DIAGNOSIS — M47812 Spondylosis without myelopathy or radiculopathy, cervical region: Secondary | ICD-10-CM | POA: Diagnosis not present

## 2022-01-25 DIAGNOSIS — R200 Anesthesia of skin: Secondary | ICD-10-CM | POA: Diagnosis not present

## 2022-02-12 ENCOUNTER — Telehealth: Payer: Self-pay

## 2022-02-12 ENCOUNTER — Other Ambulatory Visit: Payer: Self-pay | Admitting: Internal Medicine

## 2022-02-12 DIAGNOSIS — M5412 Radiculopathy, cervical region: Secondary | ICD-10-CM | POA: Diagnosis not present

## 2022-02-12 DIAGNOSIS — M4310 Spondylolisthesis, site unspecified: Secondary | ICD-10-CM | POA: Diagnosis not present

## 2022-02-12 MED ORDER — DIAZEPAM 5 MG PO TABS: 5.0000 mg | ORAL_TABLET | Freq: Every day | ORAL | 1 refills | Status: DC

## 2022-02-12 NOTE — Telephone Encounter (Signed)
Pharmacy requesting 90 day supply refill on: diazepam (VALIUM) 5 MG tablet  Pharmacy: , [place]

## 2022-02-15 ENCOUNTER — Telehealth: Payer: Self-pay | Admitting: Internal Medicine

## 2022-02-15 MED ORDER — DIAZEPAM 5 MG PO TABS: 5.0000 mg | ORAL_TABLET | Freq: Every day | ORAL | 1 refills | Status: DC

## 2022-02-15 NOTE — Telephone Encounter (Signed)
Pt is requesting a refill of diazepam (VALIUM) 5 MG tablet RX. Please send to [id number]  Phone:  [phone]  Fax:  [phone]

## 2022-03-05 ENCOUNTER — Telehealth: Payer: Self-pay | Admitting: Internal Medicine

## 2022-03-05 NOTE — Telephone Encounter (Signed)
Left message for patient to call back to schedule Medicare Annual Wellness Visit   Last AWV  03/01/21  Please schedule at anytime with [facility] [place] if patient calls the office back.      Any questions, please call me at [phone]

## 2022-03-07 ENCOUNTER — Ambulatory Visit (INDEPENDENT_AMBULATORY_CARE_PROVIDER_SITE_OTHER): Payer: Medicare Other

## 2022-03-07 DIAGNOSIS — Z0000 Encounter for general adult medical examination without abnormal findings: Secondary | ICD-10-CM | POA: Diagnosis not present

## 2022-03-07 NOTE — Progress Notes (Signed)
I connected with Daine Floras today by telephone and verified that I am speaking with the correct person using two identifiers. Location patient: home Location provider: work Persons participating in the virtual visit: patient, provider.   I discussed the limitations, risks, security and privacy concerns of performing an evaluation and management service by telephone and the availability of in person appointments. I also discussed with the patient that there may be a patient responsible charge related to this service. The patient expressed understanding and verbally consented to this telephonic visit.    Interactive audio and video telecommunications were attempted between this provider and patient, however failed, due to patient having technical difficulties OR patient did not have access to video capability.  We continued and completed visit with audio only.  Some vital signs may be absent or patient reported.   Time Spent with patient on telephone encounter: 30 minutes  Subjective:   [name] is a 81 y.o. female who presents for Medicare Annual (Subsequent) preventive examination.  Review of Systems     Cardiac Risk Factors include: advanced age (>89mn, >>59women);diabetes mellitus;hypertension;family history of premature cardiovascular disease;dyslipidemia;sedentary lifestyle     Objective:    Today's Vitals   03/07/22 1401  PainSc: 2    There is no height or weight on file to calculate BMI.     03/07/2022    2:06 PM 05/22/2021    1:16 PM 05/15/2021    9:54 AM 03/01/2021    8:54 AM 02/17/2021    3:09 PM 08/19/2019    3:39 PM 03/25/2018   11:46 AM  Advanced Directives  Does Patient Have a Medical Advance Directive? Yes Yes No Yes Yes Yes Yes  Type of Advance Directive Living will;Healthcare Power of [place]  Living will;Healthcare Power of [place] will [place] will  [place] will  Does patient want to make changes to medical advance directive? No - Patient declined   No - Patient declined     Copy of [place] Chart? No - copy requested No - copy requested  No - copy requested   No - copy requested  Would patient like information on creating a medical advance directive?   No - Patient declined        Current Medications (verified) Outpatient Encounter Medications as of 03/07/2022  Medication Sig   gabapentin (NEURONTIN) 300 MG capsule Take by mouth.   aspirin 81 MG chewable tablet Chew 81 mg by mouth at bedtime.   aspirin-acetaminophen-caffeine (EXCEDRIN MIGRAINE) 250-250-65 MG tablet Take 1 tablet by mouth 2 (two) times daily as needed for headache.   benazepril (LOTENSIN) 20 MG tablet TAKE 1 TABLET DAILY   blood glucose meter kit and supplies KIT Dispense based on patient and insurance preference. Use up to four times daily as directed. (FOR E11.9).   Calcium Citrate-Vitamin D (CALCIUM CITRATE + D PO) Take 1 tablet by mouth at bedtime.   cephALEXin (KEFLEX) 250 MG capsule Take 250 mg by mouth every other day. At bedtime   Cholecalciferol (VITAMIN D3) 50 MCG (2000 UT) TABS Take 2,000 Units by mouth at bedtime.   CRANBERRY PO Take 1 capsule by mouth at bedtime.   Cranberry POWD Take by mouth.   Cyanocobalamin (VITAMIN B-12) 5000 MCG TBDP Take 10,000 mcg by mouth in the morning.   cycloSPORINE (RESTASIS) 0.05 % ophthalmic emulsion Place 1 drop into both eyes 2 (two) times  daily.   diazepam (VALIUM) 5 MG tablet Take 1 tablet (5 mg total) by mouth at bedtime.   docusate sodium (COLACE) 100 MG capsule Take 1 capsule (100 mg total) by mouth 2 (two) times daily as needed for mild constipation.   Dulaglutide (TRULICITY) 1.5 ZO/1.0RU SOPN Inject 1.5 mg into the skin once a week.   escitalopram (LEXAPRO) 10 MG tablet TAKE 1 TABLET DAILY   estradiol (ESTRACE) 0.1 MG/GM vaginal cream Place 1 Applicatorful vaginally  every three (3) days as needed (vaginal dryness).   metFORMIN (GLUCOPHAGE-XR) 500 MG 24 hr tablet TAKE 2 TABLETS WITH BREAKFAST AND 1 TABLET WITH DINNER   methylcellulose (ARTIFICIAL TEARS) 1 % ophthalmic solution Place 1 drop into both eyes daily as needed (itching eyes).    Multiple Vitamin (MULTIVITAMIN WITH MINERALS) TABS tablet Take 1 tablet by mouth daily. One A Day for Women   nitrofurantoin (MACRODANTIN) 50 MG capsule Take 50 mg by mouth at bedtime.   ondansetron (ZOFRAN) 4 MG tablet Take 1 tablet (4 mg total) by mouth every 6 (six) hours as needed for nausea or vomiting.   polyethylene glycol (MIRALAX / GLYCOLAX) 17 g packet Take 17 g by mouth daily as needed for moderate constipation or severe constipation.   pravastatin (PRAVACHOL) 40 MG tablet TAKE 1 TABLET AT BEDTIME   SYNTHROID 50 MCG tablet TAKE 1 TABLET DAILY   No facility-administered encounter medications on file as of 03/07/2022.    Allergies (verified) Conjugated estrogens, Percocet [oxycodone-acetaminophen], and Detrol [tolterodine]   History: Past Medical History:  Diagnosis Date   Anxiety    Arthritis HANDS, NECK , BACK   Asymptomatic carotid artery stenosis LEFT --  MOD. PER DR WILLIS NOTE OCT 2012   Benign heart murmur    Echo 2011 in [place]   Depression    PMH of   Diabetes mellitus [facility]   DVT (deep venous thrombosis) ([place]) 2006   after arthroscopy   Frequency of urination    Headache    History of CVA (cerebrovascular accident) NEUROLOGIST  DR WILLIS----  06-01-2010-  LEFT BASAL GANGLIA HEMORRAGE   RESIDUAL RIGHT HAND NUMBNESS/TINGLING   Hyperlipidemia    Hypertension    Hypothyroid    MRSA (methicillin resistant staph aureus) culture positive    X3; last post TKR   Nocturia    Numbness and tingling in right hand RESIDUAL FROM CVA NOV 2011   Obesity    Redux therapy   Osteoporosis    Stroke [facility])    Thyroid disease    right thyroid nodule-- BX DONE 08-15-2011   Past Surgical History:   Procedure Laterality Date   ABDOMINAL HYSTERECTOMY  1973   Endometriosis & fibroid   CARPAL TUNNEL RELEASE  2000   RIGHT   CHOLECYSTECTOMY N/A 05/23/2021   Procedure: LAPAROSCOPIC CHOLECYSTECTOMY;  Surgeon: Coralie Keens, MD;  Location: [facility];  Service: General;  Laterality: N/A;   COLONOSCOPY  1999 & 2005   Dr Olevia Perches   cystoscope     to evaluate recurrent UTIs   CYSTOSCOPY W/ RETROGRADES  08/22/2011   Procedure: CYSTOSCOPY WITH RETROGRADE PYELOGRAM;  Surgeon: Molli Hazard, MD;  Location[facility];  Service: Urology;  Laterality: Bilateral;   CYSTOSCOPY WITH BIOPSY  08/22/2011   Procedure: CYSTOSCOPY WITH BIOPSY;  Surgeon: Molli Hazard, MD;  Location: [facility];  Service: Urology;  Laterality: N/A;   g2 p2     JOINT REPLACEMENT  03-28-2006   LEFT THUMB  KNEE ARTHROSCOPY     BILATERAL PRIOR TO TOTAL KNEE   LEFT SHOULDER SURG  1990'S   LUMBAR LAMINECTOMY  08-26-2009   L 4 - 5   THYROID NODULE BX  08-15-2011   RIGHT   TOTAL KNEE ARTHROPLASTY  05-26-2002      LEFT   AND RIGHT  03-30-2098   WRIST SURGERY  2014   tendon placed in joint; Dr Burney Gauze   Family History  Problem Relation Age of Onset   Bladder Cancer Mother    Diabetes Mother    Brain cancer Sister    Stroke Paternal Grandfather        in 93s   Diabetes Sister 21       TYPE 1    Prostate cancer Brother    Alzheimer's disease Brother    Diabetes Maternal Aunt    Diabetes Maternal Uncle    Diabetes Maternal Grandmother        Type 2   Diabetes Maternal Grandfather        Type 2   Pancreatic cancer Sister    Kidney failure Sister        in context of DM & influenza   Heart disease Neg Hx    Social History   Socioeconomic History   Marital status: Widowed    Spouse name: Not on file   Number of children: 2   Years of education: Not on file   Highest education level: Not on file  Occupational History   Not on file  Tobacco Use   Smoking status:  Never   Smokeless tobacco: Never  Vaping Use   Vaping Use: Never used  Substance and Sexual Activity   Alcohol use: Yes    Comment:  very rarely , < 1 glass wine / month   Drug use: No   Sexual activity: Not Currently    Birth control/protection: Surgical  Other Topics Concern   Not on file  Social History Narrative   Not on file   Social Determinants of Health   Financial Resource Strain: Low Risk  (03/07/2022)   Overall Financial Resource Strain (CARDIA)    Difficulty of Paying Living Expenses: Not hard at all  Food Insecurity: No Food Insecurity (03/07/2022)   Hunger Vital Sign    Worried About Running Out of Food in the Last Year: Never true    [place] in the Last Year: Never true  Transportation Needs: No Transportation Needs (03/07/2022)   PRAPARE - Hydrologist (Medical): No    Lack of Transportation (Non-Medical): No  Physical Activity: Inactive (03/07/2022)   Exercise Vital Sign    Days of Exercise per Week: 0 days    Minutes of Exercise per Session: 0 min  Stress: No Stress Concern Present (03/07/2022)   [place]    Feeling of Stress : Not at all  Social Connections: Moderately Integrated (03/07/2022)   Social Connection and Isolation Panel [NHANES]    Frequency of Communication with Friends and Family: More than three times a week    Frequency of Social Gatherings with Friends and Family: More than three times a week    Attends Religious Services: More than 4 times per year    Active Member of [employer] or Organizations: Yes    Attends Archivist Meetings: More than 4 times per year    Marital Status: Widowed    Tobacco Counseling Counseling given:  Not Answered   Clinical Intake:  Pre-visit preparation completed: Yes  Pain : 0-10 Pain Score: 2  Pain Type: Chronic pain, Neuropathic pain Pain Location: Back Pain Orientation: Lower Pain Radiating  Towards: right lower extremity Pain Descriptors / Indicators: Discomfort, Dull, Aching, Constant, Radiating Pain Onset: More than a month ago Pain Frequency: Constant Pain Relieving Factors: Steroid Injections Effect of Pain on Daily Activities: Patient does wear corrective lenses/contacts.   Eye exam done by:  Pain Relieving Factors: Steroid Injections  Nutritional Risks: None Diabetes: Yes CBG done?: No Did pt. bring in CBG monitor from home?: No  How often do you need to have someone help you when you read instructions, pamphlets, or other written materials from your doctor or pharmacy?: 1 - Never What is the last grade level you completed in school?: HSG; [employer]  Diabetic? no  Interpreter Needed?: No  Information entered by :: Lisette Abu, LPN.   Activities of Daily Living    03/07/2022    2:11 PM 05/23/2021    1:00 PM  In your present state of health, do you have any difficulty performing the following activities:  Hearing? 0   Vision? 0   Difficulty concentrating or making decisions? 0   Walking or climbing stairs? 1   Dressing or bathing? 0   Doing errands, shopping? 0 0  Preparing Food and eating ? N   Using the Toilet? N   In the past six months, have you accidently leaked urine? Y   Do you have problems with loss of bowel control? Y   Managing your Medications? N   Managing your Finances? N   Housekeeping or managing your Housekeeping? Y     Patient Care Team: Binnie Rail, MD as PCP - General (Internal Medicine) Sherlynn Stalls, MD as Consulting Physician (Ophthalmology) [facility] as Consulting Physician (Optometry)  Indicate any recent Medical Services you may have received from other than [facility] providers in the past year (date may be approximate).     Assessment:   This is a routine wellness examination for [name].  Hearing/Vision screen Hearing Screening - Comments:: Patient denied any hearing difficulty.   No  hearing aids.  Vision Screening - Comments:: Patient does wear corrective lenses/contacts.  Eye exam done by: [facility]   Dietary issues and exercise activities discussed: Current Exercise Habits: The patient does not participate in regular exercise at present, Exercise limited by: orthopedic condition(s);neurologic condition(s) (Spinal Stenosis radiating to right lower extremity.)   Goals Addressed             This Visit's Progress    To relieve the pain in my lower back.        Depression Screen    03/07/2022    2:10 PM 09/29/2021   11:10 AM 03/01/2021    8:53 AM 09/15/2020   11:33 AM 03/10/2019    9:55 AM 03/25/2018   12:30 PM 03/19/2017   11:28 AM  PHQ 2/9 Scores  PHQ - 2 Score 2 0 0 0 0 1 1  PHQ- 9 Score _0 Fall Risk    03/07/2022    2:07 PM 09/29/2021   11:10 AM 03/01/2021    8:56 AM 03/09/2020    2:02 PM 03/10/2019    9:55 AM  Fall Risk   Falls in the past year?  1 1 0 0  Number falls in past yr: 1 1 0 0 0  Comment 7 falls per patient      Injury with Fall? _0 0   Risk for fall due to : Impaired balance/gait;Orthopedic patient;History of fall(s) History of fall(s)  No Fall Risks   Follow up Falls prevention discussed Falls evaluation completed Falls evaluation completed Falls evaluation completed     [place]:  Any stairs in or around the home? No  If so, are there any without handrails? No  Home free of loose throw rugs in walkways, pet beds, electrical cords, etc? Yes  Adequate lighting in your home to reduce risk of falls? Yes   ASSISTIVE DEVICES UTILIZED TO PREVENT FALLS:  Life alert? No  Use of a cane, walker or w/c? Yes  Grab bars in the bathroom? Yes  Shower chair or bench in shower? Yes  Elevated toilet seat or a handicapped toilet? Yes   TIMED UP AND GO:  Was the test performed? No .  Length of time to ambulate 10 feet: n/a sec.   Appearance of gait: Gait not evaluated during this  visit.  Cognitive Function:    03/25/2018   11:47 AM 03/19/2017   12:17 PM 01/12/2015    9:27 AM  MMSE - Mini Mental State Exam  Not completed:   Unable to complete  Orientation to time 5 5   Orientation to Place 5 5   Registration 3 3   Attention/ Calculation 5 5   Recall 3 2   Language- name 2 objects 2 2   Language- repeat 1 1   Language- follow 3 step command 3 3   Language- read & follow direction 1 1   Write a sentence 1 1   Copy design 1 1   Total score 30 29         03/07/2022    2:18 PM  6CIT Screen  What Year? 0 points  What month? 0 points  What time? 0 points  Count back from 20 0 points  Months in reverse 0 points  Repeat phrase 0 points  Total Score 0 points    Immunizations Immunization History  Administered Date(s) Administered   Influenza Whole 07/24/1999, 06/13/2005, 06/17/2007, 04/14/2008, 04/11/2010   Influenza, High Dose Seasonal PF 03/28/2016, 03/19/2017, 03/25/2018   Influenza, Seasonal, Injecte, Preservative Fre 06/15/2014   Influenza-Unspecified 04/22/2013, 05/02/2015   PFIZER(Purple Top)SARS-COV-2 Vaccination 08/11/2019, 09/01/2019, 04/28/2020   Pneumococcal Conjugate-13 01/12/2015   Pneumococcal Polysaccharide-23 08/03/2004   Td 08/05/1996, 09/29/2010   Zoster Recombinat (Shingrix) 01/06/2018, 03/22/2018   Zoster, Live 12/21/2010    TDAP status: Due, Education has been provided regarding the importance of this vaccine. Advised may receive this vaccine at local pharmacy or Health Dept. Aware to provide a copy of the vaccination record if obtained from local pharmacy or Health Dept. Verbalized acceptance and understanding.  Flu Vaccine status: Up to date  Pneumococcal vaccine status: Up to date  Covid-19 vaccine status: Completed vaccines  Qualifies for Shingles Vaccine? Yes   Zostavax completed Yes   Shingrix Completed?: Yes  Screening Tests Health Maintenance  Topic Date Due   Diabetic kidney evaluation - Urine ACR  01/12/2016    COVID-19 Vaccine (4 - Pfizer risk series) 06/23/2020   INFLUENZA VACCINE  02/20/2022   TETANUS/TDAP  03/16/2022 (Originally 09/28/2020)   FOOT EXAM  03/16/2022   HEMOGLOBIN A1C  03/24/2022   OPHTHALMOLOGY EXAM  04/03/2022   Diabetic kidney evaluation - GFR measurement  09/22/2022   DEXA SCAN  09/15/2024  Pneumonia Vaccine 26+ Years old  Completed   Zoster Vaccines- Shingrix  Completed   HPV VACCINES  Aged Out    Health Maintenance  Health Maintenance Due  Topic Date Due   Diabetic kidney evaluation - Urine ACR  01/12/2016   COVID-19 Vaccine (4 - Pfizer risk series) 06/23/2020   INFLUENZA VACCINE  02/20/2022    Colorectal cancer screening: No longer required.   Mammogram status: Completed 03/22/2021. Repeat every year  Bone Density status: Completed 09/16/2019. Results reflect: Bone density results: NORMAL. Repeat every 5 years.  Lung Cancer Screening: (Low Dose CT Chest recommended if Age 3-80 years, 30 pack-year currently smoking OR have quit w/in 15years.) does not qualify.   Lung Cancer Screening Referral: no  Additional Screening:  Hepatitis C Screening: does not qualify; Completed no  Vision Screening: Recommended annual ophthalmology exams for early detection of glaucoma and other disorders of the eye. Is the patient up to date with their annual eye exam?  Yes  Who is the provider or what is the name of the office in which the patient attends annual eye exams? Sherlynn Stalls, MD. If pt is not established with a provider, would they like to be referred to a provider to establish care? No .   Dental Screening: Recommended annual dental exams for proper oral hygiene  Community Resource Referral / Chronic Care Management: CRR required this visit?  No   CCM required this visit?  No      Plan:     I have personally reviewed and noted the following in the patient's chart:   Medical and social history Use of alcohol, tobacco or illicit drugs  Current medications  and supplements including opioid prescriptions.  Functional ability and status Nutritional status Physical activity Advanced directives List of other physicians Hospitalizations, surgeries, and ER visits in previous 12 months Vitals Screenings to include cognitive, depression, and falls Referrals and appointments  In addition, I have reviewed and discussed with patient certain preventive protocols, quality metrics, and best practice recommendations. A written personalized care plan for preventive services as well as general preventive health recommendations were provided to patient.     Sheral Flow, LPN   [id number]   Nurse Notes:  Patient is cogitatively intact. There were no vitals filed for this visit. There is no height or weight on file to calculate BMI. Patient uses a cane for unsteady gait and balance. Medications reviewed with patient; no opioid use noted.

## 2022-03-07 NOTE — Patient Instructions (Signed)
[name] , Thank you for taking time to come for your Medicare Wellness Visit. I appreciate your ongoing commitment to your health goals. Please review the following plan we discussed and let me know if I can assist you in the future.   Screening recommendations/referrals: Colonoscopy: Not a candidate for screening due to age 81: 03/22/2021; due every year Bone Density: 09/16/2019; due every 5 years Recommended yearly ophthalmology/optometry visit for glaucoma screening and checkup Recommended yearly dental visit for hygiene and checkup  Vaccinations: Influenza vaccine: 06/20/2021 Pneumococcal vaccine: 08/03/2004, 01/12/2015 Tdap vaccine: due Shingles vaccine: 01/06/2018, 03/22/2018   Covid-19: 08/11/2019, 09/01/2019, 04/28/2020  Advanced directives: Yes; Please bring a copy of your health care power of attorney and living will to the office at your convenience.  Conditions/risks identified: Yes  Next appointment: Please schedule your next Medicare Wellness Visit with your Nurse Health Advisor in 1 year by calling [phone].   Preventive Care 26 Years and Older, Female Preventive care refers to lifestyle choices and visits with your health care provider that can promote health and wellness. What does preventive care include? A yearly physical exam. This is also called an annual well check. Dental exams once or twice a year. Routine eye exams. Ask your health care provider how often you should have your eyes checked. Personal lifestyle choices, including: Daily care of your teeth and gums. Regular physical activity. Eating a healthy diet. Avoiding tobacco and drug use. Limiting alcohol use. Practicing safe sex. Taking low-dose aspirin every day. Taking vitamin and mineral supplements as recommended by your health care provider. What happens during an annual well check? The services and screenings done by your health care provider during your annual well check will depend on your  age, overall health, lifestyle risk factors, and family history of disease. Counseling  Your health care provider may ask you questions about your: Alcohol use. Tobacco use. Drug use. Emotional well-being. Home and relationship well-being. Sexual activity. Eating habits. History of falls. Memory and ability to understand (cognition). Work and work Statistician. Reproductive health. Screening  You may have the following tests or measurements: Height, weight, and BMI. Blood pressure. Lipid and cholesterol levels. These may be checked every 5 years, or more frequently if you are over 72 years old. Skin check. Lung cancer screening. You may have this screening every year starting at age 33 if you have a 30-pack-year history of smoking and currently smoke or have quit within the past 15 years. Fecal occult blood test (FOBT) of the stool. You may have this test every year starting at age 12. Flexible sigmoidoscopy or colonoscopy. You may have a sigmoidoscopy every 5 years or a colonoscopy every 10 years starting at age 86. Hepatitis C blood test. Hepatitis B blood test. Sexually transmitted disease (STD) testing. Diabetes screening. This is done by checking your blood sugar (glucose) after you have not eaten for a while (fasting). You may have this done every 1-3 years. Bone density scan. This is done to screen for osteoporosis. You may have this done starting at age 43. Mammogram. This may be done every 1-2 years. Talk to your health care provider about how often you should have regular mammograms. Talk with your health care provider about your test results, treatment options, and if necessary, the need for more tests. Vaccines  Your health care provider may recommend certain vaccines, such as: Influenza vaccine. This is recommended every year. Tetanus, diphtheria, and acellular pertussis (Tdap, Td) vaccine. You may need a Td booster every  10 years. Zoster vaccine. You may need this after  age 74. Pneumococcal 13-valent conjugate (PCV13) vaccine. One dose is recommended after age 10. Pneumococcal polysaccharide (PPSV23) vaccine. One dose is recommended after age 53. Talk to your health care provider about which screenings and vaccines you need and how often you need them. This information is not intended to replace advice given to you by your health care provider. Make sure you discuss any questions you have with your health care provider. Document Released: 08/05/2015 Document Revised: 03/28/2016 Document Reviewed: 05/10/2015 Elsevier Interactive Patient Education  2017 [place] Prevention in the Home Falls can cause injuries. They can happen to people of all ages. There are many things you can do to make your home safe and to help prevent falls. What can I do on the outside of my home? Regularly fix the edges of walkways and driveways and fix any cracks. Remove anything that might make you trip as you walk through a door, such as a raised step or threshold. Trim any bushes or trees on the path to your home. Use bright outdoor lighting. Clear any walking paths of anything that might make someone trip, such as rocks or tools. Regularly check to see if handrails are loose or broken. Make sure that both sides of any steps have handrails. Any raised decks and porches should have guardrails on the edges. Have any leaves, snow, or ice cleared regularly. Use sand or salt on walking paths during winter. Clean up any spills in your garage right away. This includes oil or grease spills. What can I do in the bathroom? Use night lights. Install grab bars by the toilet and in the tub and shower. Do not use towel bars as grab bars. Use non-skid mats or decals in the tub or shower. If you need to sit down in the shower, use a plastic, non-slip stool. Keep the floor dry. Clean up any water that spills on the floor as soon as it happens. Remove soap buildup in the tub or shower  regularly. Attach bath mats securely with double-sided non-slip rug tape. Do not have throw rugs and other things on the floor that can make you trip. What can I do in the bedroom? Use night lights. Make sure that you have a light by your bed that is easy to reach. Do not use any sheets or blankets that are too big for your bed. They should not hang down onto the floor. Have a firm chair that has side arms. You can use this for support while you get dressed. Do not have throw rugs and other things on the floor that can make you trip. What can I do in the kitchen? Clean up any spills right away. Avoid walking on wet floors. Keep items that you use a lot in easy-to-reach places. If you need to reach something above you, use a strong step stool that has a grab bar. Keep electrical cords out of the way. Do not use floor polish or wax that makes floors slippery. If you must use wax, use non-skid floor wax. Do not have throw rugs and other things on the floor that can make you trip. What can I do with my stairs? Do not leave any items on the stairs. Make sure that there are handrails on both sides of the stairs and use them. Fix handrails that are broken or loose. Make sure that handrails are as long as the stairways. Check any carpeting to make  sure that it is firmly attached to the stairs. Fix any carpet that is loose or worn. Avoid having throw rugs at the top or bottom of the stairs. If you do have throw rugs, attach them to the floor with carpet tape. Make sure that you have a light switch at the top of the stairs and the bottom of the stairs. If you do not have them, ask someone to add them for you. What else can I do to help prevent falls? Wear shoes that: Do not have high heels. Have rubber bottoms. Are comfortable and fit you well. Are closed at the toe. Do not wear sandals. If you use a stepladder: Make sure that it is fully opened. Do not climb a closed stepladder. Make sure that  both sides of the stepladder are locked into place. Ask someone to hold it for you, if possible. Clearly mark and make sure that you can see: Any grab bars or handrails. First and last steps. Where the edge of each step is. Use tools that help you move around (mobility aids) if they are needed. These include: Canes. Walkers. Scooters. Crutches. Turn on the lights when you go into a dark area. Replace any light bulbs as soon as they burn out. Set up your furniture so you have a clear path. Avoid moving your furniture around. If any of your floors are uneven, fix them. If there are any pets around you, be aware of where they are. Review your medicines with your doctor. Some medicines can make you feel dizzy. This can increase your chance of falling. Ask your doctor what other things that you can do to help prevent falls. This information is not intended to replace advice given to you by your health care provider. Make sure you discuss any questions you have with your health care provider. Document Released: 05/05/2009 Document Revised: 12/15/2015 Document Reviewed: 08/13/2014 Elsevier Interactive Patient Education  2017 [employer].

## 2022-03-08 NOTE — Progress Notes (Signed)
Medical screening examination/treatment/procedure(s) were performed by non-physician practitioner and as supervising physician I was immediately available for consultation/collaboration.  I agree with above. Kynli Chou J Jenniferann Stuckert, MD  

## 2022-03-13 DIAGNOSIS — M5416 Radiculopathy, lumbar region: Secondary | ICD-10-CM | POA: Diagnosis not present

## 2022-03-26 ENCOUNTER — Encounter: Payer: Self-pay | Admitting: Internal Medicine

## 2022-03-26 NOTE — Progress Notes (Signed)
Subjective:    Patient ID: [name], female    DOB: [birth year], 81 y.o.   MRN: [id number]     HPI [name] is here for follow up of her chronic medical problems, including htn, hypothyroidism, hld, GAD, DM, B12 def, vit d def  She is taking her medications daily as prescribed.  Has been having back issues - sciatica.  She had an injection and that helped.  It is wearing off now.  She is scheduled to have a second injection.     She has not been exercising due to the pain.    Medications and allergies reviewed with patient and updated if appropriate.  Current Outpatient Medications on File Prior to Visit  Medication Sig Dispense Refill   aspirin 81 MG chewable tablet Chew 81 mg by mouth at bedtime.     aspirin-acetaminophen-caffeine (EXCEDRIN MIGRAINE) 250-250-65 MG tablet Take 1 tablet by mouth 2 (two) times daily as needed for headache.     benazepril (LOTENSIN) 20 MG tablet TAKE 1 TABLET DAILY 90 tablet 3   blood glucose meter kit and supplies KIT Dispense based on patient and insurance preference. Use up to four times daily as directed. (FOR E11.9). 1 each 0   Calcium Citrate-Vitamin D (CALCIUM CITRATE + D PO) Take 1 tablet by mouth at bedtime.     cephALEXin (KEFLEX) 250 MG capsule Take 250 mg by mouth every other day. At bedtime     Cholecalciferol (VITAMIN D3) 50 MCG (2000 UT) TABS Take 2,000 Units by mouth at bedtime.     Cyanocobalamin (VITAMIN B-12) 5000 MCG TBDP Take 10,000 mcg by mouth in the morning.     cycloSPORINE (RESTASIS) 0.05 % ophthalmic emulsion Place 1 drop into both eyes 2 (two) times daily.     diazepam (VALIUM) 5 MG tablet Take 1 tablet (5 mg total) by mouth at bedtime. 90 tablet 1   docusate sodium (COLACE) 100 MG capsule Take 1 capsule (100 mg total) by mouth 2 (two) times daily as needed for mild constipation.     Dulaglutide (TRULICITY) 1.5 YS/0.6TK SOPN Inject 1.5 mg into the skin once a week. 6 mL 3   escitalopram (LEXAPRO) 10 MG tablet TAKE 1  TABLET DAILY 90 tablet 3   estradiol (ESTRACE) 0.1 MG/GM vaginal cream Place 1 Applicatorful vaginally every three (3) days as needed (vaginal dryness).     gabapentin (NEURONTIN) 300 MG capsule Take by mouth.     metFORMIN (GLUCOPHAGE-XR) 500 MG 24 hr tablet TAKE 2 TABLETS WITH BREAKFAST AND 1 TABLET WITH DINNER 270 tablet 3   methylcellulose (ARTIFICIAL TEARS) 1 % ophthalmic solution Place 1 drop into both eyes daily as needed (itching eyes).      Multiple Vitamin (MULTIVITAMIN WITH MINERALS) TABS tablet Take 1 tablet by mouth daily. One A Day for Women     nitrofurantoin (MACRODANTIN) 50 MG capsule Take 50 mg by mouth at bedtime.     polyethylene glycol (MIRALAX / GLYCOLAX) 17 g packet Take 17 g by mouth daily as needed for moderate constipation or severe constipation.  0   pravastatin (PRAVACHOL) 40 MG tablet TAKE 1 TABLET AT BEDTIME 90 tablet 3   SYNTHROID 50 MCG tablet TAKE 1 TABLET DAILY 90 tablet 3   No current facility-administered medications on file prior to visit.     Review of Systems  Constitutional:  Negative for chills and fever.  Respiratory:  Negative for cough, shortness of breath and wheezing.  Cardiovascular:  Negative for chest pain, palpitations and leg swelling.  Musculoskeletal:  Positive for back pain and neck pain.  Neurological:  Negative for dizziness, light-headedness and headaches (occ from neck).       Objective:   Vitals:   03/27/22 1111  BP: 126/76  Pulse: 61  Temp: 98 F (36.7 C)  SpO2: 97%   BP Readings from Last 3 Encounters:  03/27/22 126/76  09/21/21 124/76  08/15/21 126/80   Wt Readings from Last 3 Encounters:  03/27/22 162 lb 12.8 oz (73.8 kg)  09/21/21 159 lb 9.6 oz (72.4 kg)  08/15/21 155 lb 9.6 oz (70.6 kg)   Body mass index is 26.28 kg/m.    Physical Exam Constitutional:      General: She is not in acute distress.    Appearance: Normal appearance.  HENT:     Head: Normocephalic and atraumatic.  Eyes:      Conjunctiva/sclera: Conjunctivae normal.  Cardiovascular:     Rate and Rhythm: Normal rate and regular rhythm.     Heart sounds: Normal heart sounds. No murmur heard. Pulmonary:     Effort: Pulmonary effort is normal. No respiratory distress.     Breath sounds: Normal breath sounds. No wheezing.  Musculoskeletal:     Cervical back: Neck supple.     Right lower leg: No edema.     Left lower leg: No edema.  Lymphadenopathy:     Cervical: No cervical adenopathy.  Skin:    General: Skin is warm and dry.     Findings: No rash.  Neurological:     Mental Status: She is alert. Mental status is at baseline.  Psychiatric:        Mood and Affect: Mood normal.        Behavior: Behavior normal.       Diabetic Foot Exam - Simple   Simple Foot Form Diabetic Foot exam was performed with the following findings: Yes 03/27/2022 11:41 AM  Visual Inspection No deformities, no ulcerations, no other skin breakdown bilaterally: Yes Sensation Testing See comments: Yes Pulse Check Posterior Tibialis and Dorsalis pulse intact bilaterally: Yes Comments Left foot with normal sensation.  Right toes with decreased sensation ( related to sciatica)      Lab Results  Component Value Date   WBC 12.6 (H) 05/24/2021   HGB 10.8 (L) 05/24/2021   HCT 30.7 (L) 05/24/2021   PLT 316 05/24/2021   GLUCOSE 143 (H) 09/21/2021   CHOL 125 09/21/2021   TRIG 118.0 09/21/2021   HDL 55.70 09/21/2021   LDLDIRECT 61.0 06/30/2018   LDLCALC 46 09/21/2021   ALT 12 09/21/2021   AST 18 09/21/2021   NA 140 09/21/2021   K 3.9 09/21/2021   CL 103 09/21/2021   CREATININE 0.71 09/21/2021   BUN 13 09/21/2021   CO2 30 09/21/2021   TSH 2.73 09/21/2021   INR 1.8 (H) 04/02/2008   HGBA1C 6.5 09/21/2021   MICROALBUR <0.7 01/12/2015     Assessment & Plan:    See Problem List for Assessment and Plan of chronic medical problems.

## 2022-03-26 NOTE — Patient Instructions (Addendum)
     Blood work was ordered.      Medications changes include :   none     Return in about 6 months (around 09/25/2022) for follow up.

## 2022-03-27 ENCOUNTER — Ambulatory Visit (INDEPENDENT_AMBULATORY_CARE_PROVIDER_SITE_OTHER): Payer: Medicare Other | Admitting: Internal Medicine

## 2022-03-27 ENCOUNTER — Encounter: Payer: Self-pay | Admitting: Internal Medicine

## 2022-03-27 VITALS — BP 126/76 | HR 61 | Temp 98.0°F | Ht 66.0 in | Wt 162.8 lb

## 2022-03-27 DIAGNOSIS — E538 Deficiency of other specified B group vitamins: Secondary | ICD-10-CM | POA: Diagnosis not present

## 2022-03-27 DIAGNOSIS — M542 Cervicalgia: Secondary | ICD-10-CM

## 2022-03-27 DIAGNOSIS — I10 Essential (primary) hypertension: Secondary | ICD-10-CM

## 2022-03-27 DIAGNOSIS — M5431 Sciatica, right side: Secondary | ICD-10-CM | POA: Diagnosis not present

## 2022-03-27 DIAGNOSIS — E039 Hypothyroidism, unspecified: Secondary | ICD-10-CM

## 2022-03-27 DIAGNOSIS — E559 Vitamin D deficiency, unspecified: Secondary | ICD-10-CM | POA: Diagnosis not present

## 2022-03-27 DIAGNOSIS — E1169 Type 2 diabetes mellitus with other specified complication: Secondary | ICD-10-CM | POA: Diagnosis not present

## 2022-03-27 DIAGNOSIS — E782 Mixed hyperlipidemia: Secondary | ICD-10-CM | POA: Diagnosis not present

## 2022-03-27 DIAGNOSIS — F411 Generalized anxiety disorder: Secondary | ICD-10-CM | POA: Diagnosis not present

## 2022-03-27 LAB — COMPREHENSIVE METABOLIC PANEL
ALT: 16 U/L (ref 0–35)
AST: 19 U/L (ref 0–37)
Albumin: 3.9 g/dL (ref 3.5–5.2)
Alkaline Phosphatase: 33 U/L — ABNORMAL LOW (ref 39–117)
BUN: 17 mg/dL (ref 6–23)
CO2: 27 mEq/L (ref 19–32)
Calcium: 9.7 mg/dL (ref 8.4–10.5)
Chloride: 105 mEq/L (ref 96–112)
Creatinine, Ser: 0.68 mg/dL (ref 0.40–1.20)
GFR: 80.43 mL/min (ref >60.00)
Glucose, Bld: 124 mg/dL — ABNORMAL HIGH (ref 70–99)
Potassium: 4.1 mEq/L (ref 3.5–5.1)
Sodium: 141 mEq/L (ref 135–145)
Total Bilirubin: 0.5 mg/dL (ref 0.2–1.2)
Total Protein: 6.6 g/dL (ref 6.0–8.3)

## 2022-03-27 LAB — CBC WITH DIFFERENTIAL/PLATELET
Basophils Absolute: 0.1 10*3/uL (ref 0.0–0.1)
Basophils Relative: 0.5 % (ref 0.0–3.0)
Eosinophils Absolute: 0.4 10*3/uL (ref 0.0–0.7)
Eosinophils Relative: 3.3 % (ref 0.0–5.0)
HCT: 37.3 % (ref 36.0–46.0)
Hemoglobin: 13.2 g/dL (ref 12.0–15.0)
Lymphocytes Relative: 18 % (ref 12.0–46.0)
Lymphs Abs: 1.9 10*3/uL (ref 0.7–4.0)
MCHC: 35.2 g/dL (ref 30.0–36.0)
MCV: 91 fl (ref 78.0–100.0)
Monocytes Absolute: 0.6 10*3/uL (ref 0.1–1.0)
Monocytes Relative: 5.5 % (ref 3.0–12.0)
Neutro Abs: 7.8 10*3/uL — ABNORMAL HIGH (ref 1.4–7.7)
Neutrophils Relative %: 72.7 % (ref 43.0–77.0)
Platelets: 206 10*3/uL (ref 150.0–400.0)
RBC: 4.1 Mil/uL (ref 3.87–5.11)
RDW: 12.9 % (ref 11.5–15.5)
WBC: 10.7 10*3/uL — ABNORMAL HIGH (ref 4.0–10.5)

## 2022-03-27 LAB — MICROALBUMIN / CREATININE URINE RATIO
Creatinine,U: 125.7 mg/dL
Microalb Creat Ratio: 1.5 mg/g (ref 0.0–30.0)
Microalb, Ur: 1.9 mg/dL (ref 0.0–1.9)

## 2022-03-27 LAB — TSH: TSH: 1.62 u[IU]/mL (ref 0.35–5.50)

## 2022-03-27 LAB — LIPID PANEL
Cholesterol: 130 mg/dL (ref 0–200)
HDL: 54.9 mg/dL (ref >39.00)
LDL Cholesterol: 48 mg/dL (ref 0–99)
NonHDL: 74.89
Total CHOL/HDL Ratio: 2
Triglycerides: 134 mg/dL (ref 0.0–149.0)
VLDL: 26.8 mg/dL (ref 0.0–40.0)

## 2022-03-27 LAB — VITAMIN B12: Vitamin B-12: 413 pg/mL (ref 211–911)

## 2022-03-27 LAB — HEMOGLOBIN A1C: Hgb A1c MFr Bld: 6.3 % (ref 4.6–6.5)

## 2022-03-27 LAB — VITAMIN D 25 HYDROXY (VIT D DEFICIENCY, FRACTURES): VITD: 32.43 ng/mL (ref 30.00–100.00)

## 2022-03-27 MED ORDER — CLONAZEPAM 0.5 MG PO TABS: 0.5000 mg | ORAL_TABLET | Freq: Every day | ORAL | 5 refills | Status: DC

## 2022-03-27 NOTE — Assessment & Plan Note (Signed)
Following with neurosurgery On gabapentin which is helping Has had an epidural steroid injection which helped-scheduled for another 1

## 2022-03-27 NOTE — Assessment & Plan Note (Signed)
Chronic  Clinically euthyroid Check tsh and will titrate med dose if needed Currently taking Synthroid 50 mcg daily 

## 2022-03-27 NOTE — Assessment & Plan Note (Addendum)
Chronic Controlled, Stable Diazepam not that effective for sleep and would like to go back to the clonazepam Continue Lexapro 10 mg daily, restart clonazepam 0.5 mg at bedtime

## 2022-03-27 NOTE — Assessment & Plan Note (Signed)
Chronic Chronic neck pain with headaches Following with orthopedics Gabapentin 300 mg at night has helped and symptoms controlled

## 2022-03-27 NOTE — Assessment & Plan Note (Signed)
Chronic Taking vitamin D daily Check vitamin D level  

## 2022-03-27 NOTE — Assessment & Plan Note (Signed)
Chronic Blood pressure well controlled CMP Continue benazepril 20 mg daily 

## 2022-03-27 NOTE — Assessment & Plan Note (Signed)
Chronic Taking vitamin B12 Check B12 level 

## 2022-03-27 NOTE — Assessment & Plan Note (Signed)
Chronic Regular exercise and healthy diet encouraged Check lipid panel  Continue pravastatin 40 mg daily 

## 2022-03-27 NOTE — Assessment & Plan Note (Signed)
Chronic  Lab Results  Component Value Date   HGBA1C 6.5 09/21/2021   Sugars well controlled Testing sugars 1 times a day Check A1c, urine microalbumin today Continue Trulicity 1.5 mg weekly, metformin XR 1000 mg with breakfast, 500 mg with dinner Stressed regular exercise, diabetic diet

## 2022-03-28 DIAGNOSIS — Z1231 Encounter for screening mammogram for malignant neoplasm of breast: Secondary | ICD-10-CM | POA: Diagnosis not present

## 2022-03-28 LAB — HM MAMMOGRAPHY

## 2022-03-29 DIAGNOSIS — M5416 Radiculopathy, lumbar region: Secondary | ICD-10-CM | POA: Diagnosis not present

## 2022-03-30 ENCOUNTER — Encounter: Payer: Self-pay | Admitting: Internal Medicine

## 2022-04-16 DIAGNOSIS — Z23 Encounter for immunization: Secondary | ICD-10-CM | POA: Diagnosis not present

## 2022-04-17 ENCOUNTER — Other Ambulatory Visit: Payer: Self-pay | Admitting: Internal Medicine

## 2022-04-24 DIAGNOSIS — M5416 Radiculopathy, lumbar region: Secondary | ICD-10-CM | POA: Diagnosis not present

## 2022-05-07 DIAGNOSIS — E119 Type 2 diabetes mellitus without complications: Secondary | ICD-10-CM | POA: Diagnosis not present

## 2022-05-07 LAB — HM DIABETES EYE EXAM

## 2022-05-17 ENCOUNTER — Other Ambulatory Visit: Payer: Self-pay | Admitting: Internal Medicine

## 2022-05-22 DIAGNOSIS — H43813 Vitreous degeneration, bilateral: Secondary | ICD-10-CM | POA: Diagnosis not present

## 2022-05-22 DIAGNOSIS — H35341 Macular cyst, hole, or pseudohole, right eye: Secondary | ICD-10-CM | POA: Diagnosis not present

## 2022-05-22 DIAGNOSIS — H35373 Puckering of macula, bilateral: Secondary | ICD-10-CM | POA: Diagnosis not present

## 2022-05-25 DIAGNOSIS — N3020 Other chronic cystitis without hematuria: Secondary | ICD-10-CM | POA: Diagnosis not present

## 2022-05-30 DIAGNOSIS — M5412 Radiculopathy, cervical region: Secondary | ICD-10-CM | POA: Diagnosis not present

## 2022-05-30 DIAGNOSIS — M5416 Radiculopathy, lumbar region: Secondary | ICD-10-CM | POA: Diagnosis not present

## 2022-05-30 NOTE — Therapy (Signed)
OUTPATIENT PHYSICAL THERAPY THORACOLUMBAR EVALUATION   Patient Name: [name] MRN: [id number] DOB:[birth year], 81 y.o., female Today's Date: 05/31/2022   PT End of Session - 05/31/22 0924     Visit Number 1    Date for PT Re-Evaluation 08/23/22    PT Start Time 0925    PT Stop Time 1010    PT Time Calculation (min) 45 min    Activity Tolerance Patient tolerated treatment well    Behavior During Therapy [facility] for tasks assessed/performed             Past Medical History:  Diagnosis Date   Anxiety    Arthritis HANDS, NECK , BACK   Asymptomatic carotid artery stenosis LEFT --  MOD. PER DR WILLIS NOTE OCT 2012   Benign heart murmur    Echo 2011 in [place]   Depression    PMH of   Diabetes mellitus ORAL MED   DVT (deep venous thrombosis) ([place]) 2006   after arthroscopy   Frequency of urination    Headache    History of CVA (cerebrovascular accident) NEUROLOGIST  DR WILLIS----  06-01-2010-  LEFT BASAL GANGLIA HEMORRAGE   RESIDUAL RIGHT HAND NUMBNESS/TINGLING   History of laparoscopic cholecystectomy 05/24/2021   Hyperlipidemia    Hypertension    Hypothyroid    MRSA (methicillin resistant staph aureus) culture positive    X3; last post TKR   Nocturia    Numbness and tingling in right hand RESIDUAL FROM CVA NOV 2011   Obesity    Redux therapy   Osteoporosis    Stroke [facility])    Thyroid disease    right thyroid nodule-- BX DONE 08-15-2011   Past Surgical History:  Procedure Laterality Date   ABDOMINAL HYSTERECTOMY  1973   Endometriosis & fibroid   CARPAL TUNNEL RELEASE  2000   RIGHT   CHOLECYSTECTOMY N/A 05/23/2021   Procedure: LAPAROSCOPIC CHOLECYSTECTOMY;  Surgeon: Coralie Keens, MD;  Location: [facility];  Service: General;  Laterality: N/A;   COLONOSCOPY  1999 & 2005   Dr Olevia Perches   cystoscope     to evaluate recurrent UTIs   CYSTOSCOPY W/ RETROGRADES  08/22/2011   Procedure: CYSTOSCOPY WITH RETROGRADE PYELOGRAM;  Surgeon: Molli Hazard, MD;  Location:  [facility];  Service: Urology;  Laterality: Bilateral;   CYSTOSCOPY WITH BIOPSY  08/22/2011   Procedure: CYSTOSCOPY WITH BIOPSY;  Surgeon: Molli Hazard, MD;  Location: [facility];  Service: Urology;  Laterality: N/A;   g2 p2     JOINT REPLACEMENT  03-28-2006   LEFT THUMB   KNEE ARTHROSCOPY     BILATERAL PRIOR TO TOTAL KNEE   LEFT SHOULDER SURG  1990'S   LUMBAR LAMINECTOMY  08-26-2009   L 4 - 5   THYROID NODULE BX  08-15-2011   RIGHT   TOTAL KNEE ARTHROPLASTY  05-26-2002      LEFT   AND RIGHT  03-30-2098   WRIST SURGERY  2014   tendon placed in joint; Dr Burney Gauze   Patient Active Problem List   Diagnosis Date Noted   Sciatica of right side 03/27/2022   Calculus of gallbladder with acute cholecystitis without obstruction 05/17/2021   Tingling 03/16/2021   Vitamin B12 deficiency 03/16/2021   Head injury due to trauma 02/23/2021   Low back pain 10/29/2019   Pain in left wrist 10/01/2019   Cervicalgia 07/29/2019   Trigger finger, left middle finger 04/28/2018   Hypothyroidism 03/29/2017   Fecal incontinence 09/25/2016  Generalized anxiety disorder 06/20/2015   SUI (stress urinary incontinence, female) 02/05/2014   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   Cerebral artery occlusion with cerebral infarction ([place]) 05/31/2010   Diabetes ([place]) 01/24/2010   THYROID NODULE, RIGHT 01/21/2009   Vitamin D deficiency 12/10/2007   HYPERLIPIDEMIA 06/17/2007   Essential hypertension 06/17/2007   Osteopenia 06/17/2007   Osteoarthrosis, unspecified whether generalized or localized, unspecified site 11/11/2006    PCP: Billey Gosling  REFERRING PROVIDER: Earnie Larsson  REFERRING DIAG: M54.16  Rationale for Evaluation and Treatment: Rehabilitation  THERAPY DIAG:  Muscle weakness (generalized)  Repeated falls  Other abnormalities of gait and mobility  Other low back pain  ONSET DATE: "about a year ago"  SUBJECTIVE:                                                                                                                                                                                            SUBJECTIVE STATEMENT: My back pain has gotten worse over the past year. It is mainly L4-5 and was pinching my sciatic nerve, I had a couple of shot. It is doing pretty good as long as I am not doing anything. I try to be active but I have not been lately.   PERTINENT HISTORY:  Lumbar laminectomy L4-5  PAIN:  Are you having pain? No  PRECAUTIONS: None  WEIGHT BEARING RESTRICTIONS: No  FALLS:  Has patient fallen in last 6 months? Yes. Number of falls 5-6  LIVING ENVIRONMENT: Lives with: lives with their daughter Lives in: House/apartment Stairs: No Has following equipment at home: Single point cane and Environmental consultant - 2 wheeled  OCCUPATION: Retired  PLOF: Independent  PATIENT GOALS: not to fall, get my balance better   NEXT MD VISIT:   OBJECTIVE:   SCREENING FOR RED FLAGS: Bowel or bladder incontinence: No Spinal tumors: No Cauda equina syndrome: No Compression fracture: No Abdominal aneurysm: No  COGNITION: Overall cognitive status: Within functional limits for tasks assessed     SENSATION: WFL  MUSCLE LENGTH: Hamstrings: tightness BLE  POSTURE: rounded shoulders and forward head   LUMBAR ROM:   AROM eval  Flexion Mid shin  Extension 50% w/pain  Right lateral flexion Pain at end range  Left lateral flexion Pain at end range  Right rotation Pain at end range  Left rotation Pain at end range   (Blank rows = not tested)  LOWER EXTREMITY ROM:   grossly WFL    LOWER EXTREMITY MMT:    MMT Right eval Left eval  Hip flexion 3+ 3+  Hip extension 3+ 3+  Hip abduction 3+ 3+  Hip adduction  Hip internal rotation    Hip external rotation    Knee flexion 4 4  Knee extension 4 4  Ankle dorsiflexion    Ankle plantarflexion    Ankle inversion    Ankle eversion     (Blank rows = not tested)  LUMBAR  SPECIAL TESTS:  Straight leg raise test: Positive  FUNCTIONAL TESTS:  5 times sit to stand: 24.30s Timed up and go (TUG): 16.13  GAIT: Distance walked: in clinic  Assistive device utilized: None Level of assistance: Modified independence Comments: poor foot clearance, trendelenberg, decreased step length, antalgic gait, slowed speed   TODAY'S TREATMENT:                                                                                                                              DATE: 05/31/22- Eval    PATIENT EDUCATION:  Education details: HEP, hip and core weakness contributing to LBP, fall risk Person educated: Patient Education method: Explanation Education comprehension: verbalized understanding  HOME EXERCISE PROGRAM: Access Code: [email or web address] URL: [email or web address] Date: 05/31/2022 Prepared by: Andris Baumann  Exercises - Supine Bridge  - 1 x daily - 7 x weekly - 2 sets - 10 reps - Clamshell with Resistance  - 1 x daily - 7 x weekly - 2 sets - 10 reps - Standing Hip Abduction with Counter Support  - 1 x daily - 7 x weekly - 2 sets - 10 reps - Standing Hip Extension with Counter Support  - 1 x daily - 7 x weekly - 2 sets - 10 reps  ASSESSMENT:  CLINICAL IMPRESSION: Patient is a 81 y.o. female who was seen today for physical therapy evaluation and treatment for low back pain. Patient reports her back pain has resolved for the most part after a steroid injection 3-4wks ago. However, she states that she feels really weak and her balance is not steady. She has repeated falls and presents with core and hip weakness. Patient scores a 44 on the BERG and presents as moderate fall risk. She will benefit from skilled PT intervention to decrease her risk for falls and to increase her strength to allow her to be more stable with household chores and gardening.   OBJECTIVE IMPAIRMENTS: Abnormal gait, decreased balance, difficulty walking, decreased strength, and pain.     PARTICIPATION LIMITATIONS: cleaning, laundry, and yard work  [employer] POTENTIAL: Good  CLINICAL DECISION MAKING: Stable/uncomplicated  EVALUATION COMPLEXITY: Low  GOALS: Goals reviewed with patient? No  SHORT TERM GOALS: Target date: 07/12/22  Patient will be independent with initial HEP. Goal status: INITIAL  2.  Patient will be educated on strategies to decrease risk of falls.  Baseline: 5-6 falls in the last 78month Goal status: INITIAL   LONG TERM GOALS: Target date: 08/23/22  Patient will be independent with advanced/ongoing HEP to improve outcomes and carryover.  Goal status: INITIAL  2.  Patient will demonstrate decreased fall risk by scoring < 12 sec on TUG.  Baseline: 16.31s Goal status: INITIAL  3.  Patient will demonstrate decreased fall risk by scoring < 15 sec on 5xSTS. Baseline: 24.30s Goal status: INITIAL  4.  Patient will demonstrate improved functional LE strength as demonstrated by 5/5. Goal status: INITIAL  5.  Patient will score 52 on Berg Balance test to demonstrate lower risk of falls. (MCID= 8 points) .  Baseline: 44 Goal status: INITIAL   PLAN:  PT FREQUENCY: 2x/week  PT DURATION: 12 weeks  PLANNED INTERVENTIONS: Therapeutic exercises, Therapeutic activity, Neuromuscular re-education, Balance training, Gait training, Patient/Family education, Self Care, Joint mobilization, Stair training, Dry Needling, Electrical stimulation, Cryotherapy, Moist heat, Ionotophoresis '4mg'$ /ml Dexamethasone, and Manual therapy.  PLAN FOR NEXT SESSION: hip strengthening, balance training   Andris Baumann, PT 05/31/2022, 10:10 AM

## 2022-05-31 ENCOUNTER — Ambulatory Visit: Payer: Medicare Other | Attending: Neurosurgery

## 2022-05-31 DIAGNOSIS — M5459 Other low back pain: Secondary | ICD-10-CM | POA: Diagnosis not present

## 2022-05-31 DIAGNOSIS — R296 Repeated falls: Secondary | ICD-10-CM | POA: Diagnosis not present

## 2022-05-31 DIAGNOSIS — R2689 Other abnormalities of gait and mobility: Secondary | ICD-10-CM | POA: Diagnosis not present

## 2022-05-31 DIAGNOSIS — M6281 Muscle weakness (generalized): Secondary | ICD-10-CM | POA: Diagnosis not present

## 2022-06-04 ENCOUNTER — Ambulatory Visit: Payer: Medicare Other | Admitting: Physical Therapy

## 2022-06-04 ENCOUNTER — Encounter: Payer: Self-pay | Admitting: Physical Therapy

## 2022-06-04 DIAGNOSIS — R2689 Other abnormalities of gait and mobility: Secondary | ICD-10-CM | POA: Diagnosis not present

## 2022-06-04 DIAGNOSIS — M5459 Other low back pain: Secondary | ICD-10-CM | POA: Diagnosis not present

## 2022-06-04 DIAGNOSIS — M6281 Muscle weakness (generalized): Secondary | ICD-10-CM

## 2022-06-04 DIAGNOSIS — R296 Repeated falls: Secondary | ICD-10-CM | POA: Diagnosis not present

## 2022-06-04 NOTE — Therapy (Signed)
OUTPATIENT PHYSICAL THERAPY THORACOLUMBAR TREATMENT   Patient Name: [name] MRN: [id number] DOB:[birth year], 81 y.o., female Today's Date: 06/04/2022   PT End of Session - 06/04/22 1148     Visit Number 2    Date for PT Re-Evaluation 08/23/22    PT Start Time 6811    PT Stop Time 1230    PT Time Calculation (min) 45 min    Activity Tolerance Patient tolerated treatment well    Behavior During Therapy WFL for tasks assessed/performed             Past Medical History:  Diagnosis Date   Anxiety    Arthritis HANDS, NECK , BACK   Asymptomatic carotid artery stenosis LEFT --  MOD. PER DR WILLIS NOTE OCT 2012   Benign heart murmur    Echo 2011 in [place]   Depression    PMH of   Diabetes mellitus [facility]AL [facility]   DVT (deep venous thrombosis) ([place]) 2006   after arthroscopy   Frequency of urination    Headache    History of CVA (cerebrovascular accident) NEUROLOGIST  DR WILLIS----  06-01-2010-  LEFT BASAL GANGLIA HEMORRAGE   RESIDUAL RIGHT HAND NUMBNESS/TINGLING   History of laparoscopic cholecystectomy 05/24/2021   Hyperlipidemia    Hypertension    Hypothyroid    MRSA (methicillin resistant staph aureus) culture positive    X3; last post TKR   Nocturia    Numbness and tingling in right hand RESIDUAL FROM CVA NOV 2011   Obesity    Redux therapy   Osteoporosis    Stroke [facility])    Thyroid disease    right thyroid nodule-- BX DONE 08-15-2011   Past Surgical History:  Procedure Laterality Date   ABDOMINAL HYSTERECTOMY  1973   Endometriosis & fibroid   CARPAL TUNNEL RELEASE  2000   RIGHT   CHOLECYSTECTOMY N/A 05/23/2021   Procedure: LAPAROSCOPIC CHOLECYSTECTOMY;  Surgeon: Coralie Keens, MD;  Location: [facility];  Service: General;  Laterality: N/A;   COLONOSCOPY  1999 & 2005   Dr Olevia Perches   cystoscope     to evaluate recurrent UTIs   CYSTOSCOPY W/ RETROGRADES  08/22/2011   Procedure: CYSTOSCOPY WITH RETROGRADE PYELOGRAM;  Surgeon: Molli Hazard, MD;  Location:  [facility];  Service: Urology;  Laterality: Bilateral;   CYSTOSCOPY WITH BIOPSY  08/22/2011   Procedure: CYSTOSCOPY WITH BIOPSY;  Surgeon: Molli Hazard, MD;  Location: [facility];  Service: Urology;  Laterality: N/A;   g2 p2     JOINT REPLACEMENT  03-28-2006   LEFT THUMB   KNEE ARTHROSCOPY     BILATERAL PRIOR TO TOTAL KNEE   LEFT SHOULDER SURG  1990'S   LUMBAR LAMINECTOMY  08-26-2009   L 4 - 5   THYROID NODULE BX  08-15-2011   RIGHT   TOTAL KNEE ARTHROPLASTY  05-26-2002      LEFT   AND RIGHT  03-30-2098   WRIST SURGERY  2014   tendon placed in joint; Dr Burney Gauze   Patient Active Problem List   Diagnosis Date Noted   Sciatica of right side 03/27/2022   Calculus of gallbladder with acute cholecystitis without obstruction 05/17/2021   Tingling 03/16/2021   Vitamin B12 deficiency 03/16/2021   Head injury due to trauma 02/23/2021   Low back pain 10/29/2019   Pain in left wrist 10/01/2019   Cervicalgia 07/29/2019   Trigger finger, left middle finger 04/28/2018   Hypothyroidism 03/29/2017   Fecal incontinence 09/25/2016  Generalized anxiety disorder 06/20/2015   SUI (stress urinary incontinence, female) 02/05/2014   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   Cerebral artery occlusion with cerebral infarction ([place]) 05/31/2010   Diabetes ([place]) 01/24/2010   THYROID NODULE, RIGHT 01/21/2009   Vitamin D deficiency 12/10/2007   HYPERLIPIDEMIA 06/17/2007   Essential hypertension 06/17/2007   Osteopenia 06/17/2007   Osteoarthrosis, unspecified whether generalized or localized, unspecified site 11/11/2006    PCP: Billey Gosling  REFERRING PROVIDER: Earnie Larsson  REFERRING DIAG: M54.16  Rationale for Evaluation and Treatment: Rehabilitation  THERAPY DIAG:  Muscle weakness (generalized)  Other low back pain  Repeated falls  ONSET DATE: "about a year ago"  SUBJECTIVE:                                                                                                                                                                                            SUBJECTIVE STATEMENT: "Im not hurting right now" Has been doing HEP  PERTINENT HISTORY:  Lumbar laminectomy L4-5  PAIN:  Are you having pain? No  PRECAUTIONS: None  WEIGHT BEARING RESTRICTIONS: No  FALLS:  Has patient fallen in last 6 months? Yes. Number of falls 5-6  LIVING ENVIRONMENT: Lives with: lives with their daughter Lives in: House/apartment Stairs: No Has following equipment at home: Single point cane and Environmental consultant - 2 wheeled  OCCUPATION: Retired  PLOF: Independent  PATIENT GOALS: not to fall, get my balance better   NEXT MD VISIT:   OBJECTIVE:   SCREENING FOR RED FLAGS: Bowel or bladder incontinence: No Spinal tumors: No Cauda equina syndrome: No Compression fracture: No Abdominal aneurysm: No  COGNITION: Overall cognitive status: Within functional limits for tasks assessed     SENSATION: WFL  MUSCLE LENGTH: Hamstrings: tightness BLE  POSTURE: rounded shoulders and forward head   LUMBAR ROM:   AROM eval  Flexion Mid shin  Extension 50% w/pain  Right lateral flexion Pain at end range  Left lateral flexion Pain at end range  Right rotation Pain at end range  Left rotation Pain at end range   (Blank rows = not tested)  LOWER EXTREMITY ROM:   grossly WFL    LOWER EXTREMITY MMT:    MMT Right eval Left eval  Hip flexion 3+ 3+  Hip extension 3+ 3+  Hip abduction 3+ 3+  Hip adduction    Hip internal rotation    Hip external rotation    Knee flexion 4 4  Knee extension 4 4  Ankle dorsiflexion    Ankle plantarflexion    Ankle inversion    Ankle eversion     (Blank rows = not tested)  LUMBAR SPECIAL TESTS:  Straight leg raise test: Positive  FUNCTIONAL TESTS:  5 times sit to stand: 24.30s Timed up and go (TUG): 16.13  GAIT: Distance walked: in clinic  Assistive device utilized: None Level of assistance: Modified  independence Comments: poor foot clearance, trendelenberg, decreased step length, antalgic gait, slowed speed   TODAY'S TREATMENT:                                                                                                                              DATE:  06/04/22 Bike L3 x 6 min Hamstring curls 20lb 2x10 Leg Ext 5lb 2x10  Rows green 2x10  Shoulder Ext red 2x10 S2S 2x10 Bridges x10 LE on Pball bridges, K2C, Oblq     PATIENT EDUCATION:  Education details: HEP, hip and core weakness contributing to LBP, fall risk Person educated: Patient Education method: Explanation Education comprehension: verbalized understanding  HOME EXERCISE PROGRAM: Access Code: [email or web address] URL: [email or web address] Date: 05/31/2022 Prepared by: Andris Baumann  Exercises - Supine Bridge  - 1 x daily - 7 x weekly - 2 sets - 10 reps - Clamshell with Resistance  - 1 x daily - 7 x weekly - 2 sets - 10 reps - Standing Hip Abduction with Counter Support  - 1 x daily - 7 x weekly - 2 sets - 10 reps - Standing Hip Extension with Counter Support  - 1 x daily - 7 x weekly - 2 sets - 10 reps  ASSESSMENT:  CLINICAL IMPRESSION: Patient enters doing well wit no pain. Session consisted of postural and LE strength. Cue not to allow LE to push against table with sit to stands. Cues also needed for posture with rows and extensions. No reports of increase pain. Weakness noted with supine bridges LE on Pball.   OBJECTIVE IMPAIRMENTS: Abnormal gait, decreased balance, difficulty walking, decreased strength, and pain.    PARTICIPATION LIMITATIONS: cleaning, laundry, and yard work  [employer] POTENTIAL: Good  CLINICAL DECISION MAKING: Stable/uncomplicated  EVALUATION COMPLEXITY: Low  GOALS: Goals reviewed with patient? No  SHORT TERM GOALS: Target date: 07/12/22  Patient will be independent with initial HEP. Goal status: INITIAL  2.  Patient will be educated on strategies to decrease risk of  falls.  Baseline: 5-6 falls in the last 33month Goal status: INITIAL   LONG TERM GOALS: Target date: 08/23/22  Patient will be independent with advanced/ongoing HEP to improve outcomes and carryover.  Goal status: INITIAL  2.  Patient will demonstrate decreased fall risk by scoring < 12 sec on TUG. Baseline: 16.31s Goal status: INITIAL  3.  Patient will demonstrate decreased fall risk by scoring < 15 sec on 5xSTS. Baseline: 24.30s Goal status: INITIAL  4.  Patient will demonstrate improved functional LE strength as demonstrated by 5/5. Goal status: INITIAL  5.  Patient will score 52 on Berg Balance test to demonstrate lower risk of falls. (MCID= 8 points) .  Baseline: 44 Goal status: INITIAL  PLAN:  PT FREQUENCY: 2x/week  PT DURATION: 12 weeks  PLANNED INTERVENTIONS: Therapeutic exercises, Therapeutic activity, Neuromuscular re-education, Balance training, Gait training, Patient/Family education, Self Care, Joint mobilization, Stair training, Dry Needling, Electrical stimulation, Cryotherapy, Moist heat, Ionotophoresis '4mg'$ /ml Dexamethasone, and Manual therapy.  PLAN FOR NEXT SESSION: hip strengthening, balance training   Scot Jun, PTA 06/04/2022, 11:50 AM

## 2022-06-07 ENCOUNTER — Encounter: Payer: Self-pay | Admitting: Physical Therapy

## 2022-06-07 ENCOUNTER — Ambulatory Visit: Payer: Medicare Other | Admitting: Physical Therapy

## 2022-06-07 DIAGNOSIS — R296 Repeated falls: Secondary | ICD-10-CM | POA: Diagnosis not present

## 2022-06-07 DIAGNOSIS — M5459 Other low back pain: Secondary | ICD-10-CM

## 2022-06-07 DIAGNOSIS — M6281 Muscle weakness (generalized): Secondary | ICD-10-CM

## 2022-06-07 DIAGNOSIS — R2689 Other abnormalities of gait and mobility: Secondary | ICD-10-CM | POA: Diagnosis not present

## 2022-06-07 NOTE — Therapy (Signed)
OUTPATIENT PHYSICAL THERAPY THORACOLUMBAR TREATMENT   Patient Name: [name] MRN: [id number] DOB:Mar 07, 1941, 81 y.o., female Today's Date: 06/07/2022   PT End of Session - 06/07/22 1347     Visit Number 3    Date for PT Re-Evaluation 08/23/22    PT Start Time 1345    PT Stop Time 1430    PT Time Calculation (min) 45 min    Activity Tolerance Patient tolerated treatment well    Behavior During Therapy WFL for tasks assessed/performed             Past Medical History:  Diagnosis Date   Anxiety    Arthritis HANDS, NECK , BACK   Asymptomatic carotid artery stenosis LEFT --  MOD. PER DR WILLIS NOTE OCT 2012   Benign heart murmur    Echo 2011 in [place]   Depression    PMH of   Diabetes mellitus ORAL MED   DVT (deep venous thrombosis) ([place]) 2006   after arthroscopy   Frequency of urination    Headache    History of CVA (cerebrovascular accident) NEUROLOGIST  DR WILLIS----  06-01-2010-  LEFT BASAL GANGLIA HEMORRAGE   RESIDUAL RIGHT HAND NUMBNESS/TINGLING   History of laparoscopic cholecystectomy 05/24/2021   Hyperlipidemia    Hypertension    Hypothyroid    MRSA (methicillin resistant staph aureus) culture positive    X3; last post TKR   Nocturia    Numbness and tingling in right hand RESIDUAL FROM CVA NOV 2011   Obesity    Redux therapy   Osteoporosis    Stroke [facility])    Thyroid disease    right thyroid nodule-- BX DONE 08-15-2011   Past Surgical History:  Procedure Laterality Date   ABDOMINAL HYSTERECTOMY  1973   Endometriosis & fibroid   CARPAL TUNNEL RELEASE  2000   RIGHT   CHOLECYSTECTOMY N/A 05/23/2021   Procedure: LAPAROSCOPIC CHOLECYSTECTOMY;  Surgeon: Coralie Keens, MD;  Location: [facility];  Service: General;  Laterality: N/A;   COLONOSCOPY  1999 & 2005   Dr Olevia Perches   cystoscope     to evaluate recurrent UTIs   CYSTOSCOPY W/ RETROGRADES  08/22/2011   Procedure: CYSTOSCOPY WITH RETROGRADE PYELOGRAM;  Surgeon: Molli Hazard, MD;  Location:  [facility];  Service: Urology;  Laterality: Bilateral;   CYSTOSCOPY WITH BIOPSY  08/22/2011   Procedure: CYSTOSCOPY WITH BIOPSY;  Surgeon: Molli Hazard, MD;  Location: [facility];  Service: Urology;  Laterality: N/A;   g2 p2     JOINT REPLACEMENT  03-28-2006   LEFT THUMB   KNEE ARTHROSCOPY     BILATERAL PRIOR TO TOTAL KNEE   LEFT SHOULDER SURG  1990'S   LUMBAR LAMINECTOMY  08-26-2009   L 4 - 5   THYROID NODULE BX  08-15-2011   RIGHT   TOTAL KNEE ARTHROPLASTY  05-26-2002      LEFT   AND RIGHT  03-30-2098   WRIST SURGERY  2014   tendon placed in joint; Dr Burney Gauze   Patient Active Problem List   Diagnosis Date Noted   Sciatica of right side 03/27/2022   Calculus of gallbladder with acute cholecystitis without obstruction 05/17/2021   Tingling 03/16/2021   Vitamin B12 deficiency 03/16/2021   Head injury due to trauma 02/23/2021   Low back pain 10/29/2019   Pain in left wrist 10/01/2019   Cervicalgia 07/29/2019   Trigger finger, left middle finger 04/28/2018   Hypothyroidism 03/29/2017   Fecal incontinence 09/25/2016  Generalized anxiety disorder 06/20/2015   SUI (stress urinary incontinence, female) 02/05/2014   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   Cerebral artery occlusion with cerebral infarction ([place]) 05/31/2010   Diabetes ([place]) 01/24/2010   THYROID NODULE, RIGHT 01/21/2009   Vitamin D deficiency 12/10/2007   HYPERLIPIDEMIA 06/17/2007   Essential hypertension 06/17/2007   Osteopenia 06/17/2007   Osteoarthrosis, unspecified whether generalized or localized, unspecified site 11/11/2006    PCP: Billey Gosling  REFERRING PROVIDER: Earnie Larsson  REFERRING DIAG: M54.16  Rationale for Evaluation and Treatment: Rehabilitation  THERAPY DIAG:  Muscle weakness (generalized)  Other low back pain  ONSET DATE: "about a year ago"  SUBJECTIVE:                                                                                                                                                                                            SUBJECTIVE STATEMENT: "Better, Im not as [place] a  I was"  PERTINENT HISTORY:  Lumbar laminectomy L4-5  PAIN:  Are you having pain? No  PRECAUTIONS: None  WEIGHT BEARING RESTRICTIONS: No  FALLS:  Has patient fallen in last 6 months? Yes. Number of falls 5-6  LIVING ENVIRONMENT: Lives with: lives with their daughter Lives in: House/apartment Stairs: No Has following equipment at home: Single point cane and Environmental consultant - 2 wheeled  OCCUPATION: Retired  PLOF: Independent  PATIENT GOALS: not to fall, get my balance better   NEXT MD VISIT:   OBJECTIVE:   SCREENING FOR RED FLAGS: Bowel or bladder incontinence: No Spinal tumors: No Cauda equina syndrome: No Compression fracture: No Abdominal aneurysm: No  COGNITION: Overall cognitive status: Within functional limits for tasks assessed     SENSATION: WFL  MUSCLE LENGTH: Hamstrings: tightness BLE  POSTURE: rounded shoulders and forward head   LUMBAR ROM:   AROM eval  Flexion Mid shin  Extension 50% w/pain  Right lateral flexion Pain at end range  Left lateral flexion Pain at end range  Right rotation Pain at end range  Left rotation Pain at end range   (Blank rows = not tested)  LOWER EXTREMITY ROM:   grossly WFL    LOWER EXTREMITY MMT:    MMT Right eval Left eval  Hip flexion 3+ 3+  Hip extension 3+ 3+  Hip abduction 3+ 3+  Hip adduction    Hip internal rotation    Hip external rotation    Knee flexion 4 4  Knee extension 4 4  Ankle dorsiflexion    Ankle plantarflexion    Ankle inversion    Ankle eversion     (Blank rows = not tested)  LUMBAR SPECIAL  TESTS:  Straight leg raise test: Positive  FUNCTIONAL TESTS:  5 times sit to stand: 24.30s Timed up and go (TUG): 16.13  GAIT: Distance walked: in clinic  Assistive device utilized: None Level of assistance: Modified independence Comments: poor  foot clearance, trendelenberg, decreased step length, antalgic gait, slowed speed   TODAY'S TREATMENT:                                                                                                                              DATE:  06/04/22 NuStep L5 x 7 min 30lb resisted gait 4 way x 3 each Hamstring curls 20lb 2x12 Leg Ext 5lb 2x10  Bridges 2x10 LE on Pball bridges, K2C, Oblq HS, piriformis, and glite stretch  06/04/22 Bike L3 x 6 min Hamstring curls 20lb 2x10 Leg Ext 5lb 2x10  Rows green 2x10  Shoulder Ext red 2x10 S2S 2x10 Bridges x10 LE on Pball bridges, K2C, Oblq     PATIENT EDUCATION:  Education details: HEP, hip and core weakness contributing to LBP, fall risk Person educated: Patient Education method: Explanation Education comprehension: verbalized understanding  HOME EXERCISE PROGRAM: Access Code: [email or web address] URL: [email or web address] Date: 05/31/2022 Prepared by: Andris Baumann  Exercises - Supine Bridge  - 1 x daily - 7 x weekly - 2 sets - 10 reps - Clamshell with Resistance  - 1 x daily - 7 x weekly - 2 sets - 10 reps - Standing Hip Abduction with Counter Support  - 1 x daily - 7 x weekly - 2 sets - 10 reps - Standing Hip Extension with Counter Support  - 1 x daily - 7 x weekly - 2 sets - 10 reps  ASSESSMENT:  CLINICAL IMPRESSION: Again patient enters doing well with no pain.  Continues with a progression of LE and balance interventions. Cue for full ROM needed with seated LAQ. Some hip weakness present with resisted side steps. Cue for core engagement needed with supine interventions.  OBJECTIVE IMPAIRMENTS: Abnormal gait, decreased balance, difficulty walking, decreased strength, and pain.    PARTICIPATION LIMITATIONS: cleaning, laundry, and yard work  [employer] POTENTIAL: Good  CLINICAL DECISION MAKING: Stable/uncomplicated  EVALUATION COMPLEXITY: Low  GOALS: Goals reviewed with patient? No  SHORT TERM GOALS: Target date:  07/12/22  Patient will be independent with initial HEP. Goal status: INITIAL  2.  Patient will be educated on strategies to decrease risk of falls.  Baseline: 5-6 falls in the last 13month Goal status: INITIAL   LONG TERM GOALS: Target date: 08/23/22  Patient will be independent with advanced/ongoing HEP to improve outcomes and carryover.  Goal status: INITIAL  2.  Patient will demonstrate decreased fall risk by scoring < 12 sec on TUG. Baseline: 16.31s Goal status: INITIAL  3.  Patient will demonstrate decreased fall risk by scoring < 15 sec on 5xSTS. Baseline: 24.30s Goal status: INITIAL  4.  Patient will demonstrate improved functional LE strength as demonstrated by 5/5. Goal status: INITIAL  5.  Patient will score 52 on Berg Balance test to demonstrate lower risk of falls. (MCID= 8 points) .  Baseline: 44 Goal status: INITIAL   PLAN:  PT FREQUENCY: 2x/week  PT DURATION: 12 weeks  PLANNED INTERVENTIONS: Therapeutic exercises, Therapeutic activity, Neuromuscular re-education, Balance training, Gait training, Patient/Family education, Self Care, Joint mobilization, Stair training, Dry Needling, Electrical stimulation, Cryotherapy, Moist heat, Ionotophoresis '4mg'$ /ml Dexamethasone, and Manual therapy.  PLAN FOR NEXT SESSION: hip strengthening, balance training   Scot Jun, PTA 06/07/2022, 1:48 PM

## 2022-06-11 NOTE — Therapy (Signed)
OUTPATIENT PHYSICAL THERAPY THORACOLUMBAR TREATMENT   Patient Name: [name] MRN: [id number] DOB:November 12, 1940, 81 y.o., female Today's Date: 06/12/2022   PT End of Session - 06/12/22 1542     Visit Number 4    Date for PT Re-Evaluation 08/23/22    PT Start Time 1543    PT Stop Time 1627    PT Time Calculation (min) 44 min    Activity Tolerance Patient tolerated treatment well    Behavior During Therapy WFL for tasks assessed/performed              Past Medical History:  Diagnosis Date   Anxiety    Arthritis HANDS, NECK , BACK   Asymptomatic carotid artery stenosis LEFT --  MOD. PER DR WILLIS NOTE OCT 2012   Benign heart murmur    Echo 2011 in [place]   Depression    PMH of   Diabetes mellitus [facility]AL [facility]   DVT (deep venous thrombosis) ([place]) 2006   after arthroscopy   Frequency of urination    Headache    History of CVA (cerebrovascular accident) NEUROLOGIST  DR WILLIS----  06-01-2010-  LEFT BASAL GANGLIA HEMORRAGE   RESIDUAL RIGHT HAND NUMBNESS/TINGLING   History of laparoscopic cholecystectomy 05/24/2021   Hyperlipidemia    Hypertension    Hypothyroid    MRSA (methicillin resistant staph aureus) culture positive    X3; last post TKR   Nocturia    Numbness and tingling in right hand RESIDUAL FROM CVA NOV 2011   Obesity    Redux therapy   Osteoporosis    Stroke [facility])    Thyroid disease    right thyroid nodule-- BX DONE 08-15-2011   Past Surgical History:  Procedure Laterality Date   ABDOMINAL HYSTERECTOMY  1973   Endometriosis & fibroid   CARPAL TUNNEL RELEASE  2000   RIGHT   CHOLECYSTECTOMY N/A 05/23/2021   Procedure: LAPAROSCOPIC CHOLECYSTECTOMY;  Surgeon: Coralie Keens, MD;  Location: [facility];  Service: General;  Laterality: N/A;   COLONOSCOPY  1999 & 2005   Dr Olevia Perches   cystoscope     to evaluate recurrent UTIs   CYSTOSCOPY W/ RETROGRADES  08/22/2011   Procedure: CYSTOSCOPY WITH RETROGRADE PYELOGRAM;  Surgeon: Molli Hazard, MD;  Location:  [facility];  Service: Urology;  Laterality: Bilateral;   CYSTOSCOPY WITH BIOPSY  08/22/2011   Procedure: CYSTOSCOPY WITH BIOPSY;  Surgeon: Molli Hazard, MD;  Location[facility];  Service: Urology;  Laterality: N/A;   g2 p2     JOINT REPLACEMENT  03-28-2006   LEFT THUMB   KNEE ARTHROSCOPY     BILATERAL PRIOR TO TOTAL KNEE   LEFT SHOULDER SURG  1990'S   LUMBAR LAMINECTOMY  08-26-2009   L 4 - 5   THYROID NODULE BX  08-15-2011   RIGHT   TOTAL KNEE ARTHROPLASTY  05-26-2002      LEFT   AND RIGHT  03-30-2098   WRIST SURGERY  2014   tendon placed in joint; Dr Burney Gauze   Patient Active Problem List   Diagnosis Date Noted   Sciatica of right side 03/27/2022   Calculus of gallbladder with acute cholecystitis without obstruction 05/17/2021   Tingling 03/16/2021   Vitamin B12 deficiency 03/16/2021   Head injury due to trauma 02/23/2021   Low back pain 10/29/2019   Pain in left wrist 10/01/2019   Cervicalgia 07/29/2019   Trigger finger, left middle finger 04/28/2018   Hypothyroidism 03/29/2017   Fecal incontinence 09/25/2016  Generalized anxiety disorder 06/20/2015   SUI (stress urinary incontinence, female) 02/05/2014   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   Cerebral artery occlusion with cerebral infarction ([place]) 05/31/2010   Diabetes ([place]) 01/24/2010   THYROID NODULE, RIGHT 01/21/2009   Vitamin D deficiency 12/10/2007   HYPERLIPIDEMIA 06/17/2007   Essential hypertension 06/17/2007   Osteopenia 06/17/2007   Osteoarthrosis, unspecified whether generalized or localized, unspecified site 11/11/2006    PCP: Billey Gosling  REFERRING PROVIDER: Earnie Larsson  REFERRING DIAG: M54.16  Rationale for Evaluation and Treatment: Rehabilitation  THERAPY DIAG:  Muscle weakness (generalized)  Other low back pain  Repeated falls  Other abnormalities of gait and mobility  ONSET DATE: "about a year ago"  SUBJECTIVE:                                                                                                                                                                                            SUBJECTIVE STATEMENT: I am doing good, I think it is helping me. I can tell I am more stable than I was and every now and then I get a little pain in my back but it doesn't bother me too much   PERTINENT HISTORY:  Lumbar laminectomy L4-5  PAIN:  Are you having pain? No  PRECAUTIONS: None  WEIGHT BEARING RESTRICTIONS: No  FALLS:  Has patient fallen in last 6 months? Yes. Number of falls 5-6  LIVING ENVIRONMENT: Lives with: lives with their daughter Lives in: House/apartment Stairs: No Has following equipment at home: Single point cane and Environmental consultant - 2 wheeled  OCCUPATION: Retired  PLOF: Independent  PATIENT GOALS: not to fall, get my balance better   NEXT MD VISIT:   OBJECTIVE:   SCREENING FOR RED FLAGS: Bowel or bladder incontinence: No Spinal tumors: No Cauda equina syndrome: No Compression fracture: No Abdominal aneurysm: No  COGNITION: Overall cognitive status: Within functional limits for tasks assessed     SENSATION: WFL  MUSCLE LENGTH: Hamstrings: tightness BLE  POSTURE: rounded shoulders and forward head   LUMBAR ROM:   AROM eval  Flexion Mid shin  Extension 50% w/pain  Right lateral flexion Pain at end range  Left lateral flexion Pain at end range  Right rotation Pain at end range  Left rotation Pain at end range   (Blank rows = not tested)  LOWER EXTREMITY ROM:   grossly WFL    LOWER EXTREMITY MMT:    MMT Right eval Left eval  Hip flexion 3+ 3+  Hip extension 3+ 3+  Hip abduction 3+ 3+  Hip adduction    Hip internal rotation    Hip external rotation  Knee flexion 4 4  Knee extension 4 4  Ankle dorsiflexion    Ankle plantarflexion    Ankle inversion    Ankle eversion     (Blank rows = not tested)  LUMBAR SPECIAL TESTS:  Straight leg raise test: Positive  FUNCTIONAL  TESTS:  5 times sit to stand: 24.30s Timed up and go (TUG): 16.13  GAIT: Distance walked: in clinic  Assistive device utilized: None Level of assistance: Modified independence Comments: poor foot clearance, trendelenberg, decreased step length, antalgic gait, slowed speed   TODAY'S TREATMENT:                                                                                                                              DATE:  06/12/22 NuStep L5 x38mns  Leg ext 5# 2x10 HS curls 20# 2x10 Rows and lats 15# 2x10 Shoulder ext 5# 2x10 S2S on Airex 2x10- elevated mat table  Walking on beam  06/04/22 NuStep L5 x 7 min 30lb resisted gait 4 way x 3 each Hamstring curls 20lb 2x12 Leg Ext 5lb 2x10  Bridges 2x10 LE on Pball bridges, K2C, Oblq HS, piriformis, and glite stretch  06/04/22 Bike L3 x 6 min Hamstring curls 20lb 2x10 Leg Ext 5lb 2x10  Rows green 2x10  Shoulder Ext red 2x10 S2S 2x10 Bridges x10 LE on Pball bridges, K2C, Oblq     PATIENT EDUCATION:  Education details: HEP, hip and core weakness contributing to LBP, fall risk Person educated: Patient Education method: Explanation Education comprehension: verbalized understanding  HOME EXERCISE PROGRAM: Access Code: [email or web address] URL: [email or web address] Date: 05/31/2022 Prepared by: MAndris Baumann Exercises - Supine Bridge  - 1 x daily - 7 x weekly - 2 sets - 10 reps - Clamshell with Resistance  - 1 x daily - 7 x weekly - 2 sets - 10 reps - Standing Hip Abduction with Counter Support  - 1 x daily - 7 x weekly - 2 sets - 10 reps - Standing Hip Extension with Counter Support  - 1 x daily - 7 x weekly - 2 sets - 10 reps  ASSESSMENT:  CLINICAL IMPRESSION: Patient enters doing well with no pain.  Continues with a progression of LE and balance interventions. Difficulty with sit to stands on airex and narrow walking on beam. Could benefit from ongoing balance tasks to decrease fall risk.    OBJECTIVE  IMPAIRMENTS: Abnormal gait, decreased balance, difficulty walking, decreased strength, and pain.    PARTICIPATION LIMITATIONS: cleaning, laundry, and yard work  [employer]OT[employer]: Good  CLINICAL DECISION MAKING: Stable/uncomplicated  EVALUATION COMPLEXITY: Low  GOALS: Goals reviewed with patient? No  SHORT TERM GOALS: Target date: 07/12/22  Patient will be independent with initial HEP. Goal status: INITIAL  2.  Patient will be educated on strategies to decrease risk of falls.  Baseline: 5-6 falls in the last 631monthGoal status: INITIAL   LONG TERM GOALS: Target date: 08/23/22  Patient will be independent with  advanced/ongoing HEP to improve outcomes and carryover.  Goal status: INITIAL  2.  Patient will demonstrate decreased fall risk by scoring < 12 sec on TUG. Baseline: 16.31s Goal status: INITIAL  3.  Patient will demonstrate decreased fall risk by scoring < 15 sec on 5xSTS. Baseline: 24.30s Goal status: INITIAL  4.  Patient will demonstrate improved functional LE strength as demonstrated by 5/5. Goal status: INITIAL  5.  Patient will score 52 on Berg Balance test to demonstrate lower risk of falls. (MCID= 8 points) .  Baseline: 44 Goal status: INITIAL   PLAN:  PT FREQUENCY: 2x/week  PT DURATION: 12 weeks  PLANNED INTERVENTIONS: Therapeutic exercises, Therapeutic activity, Neuromuscular re-education, Balance training, Gait training, Patient/Family education, Self Care, Joint mobilization, Stair training, Dry Needling, Electrical stimulation, Cryotherapy, Moist heat, Ionotophoresis '4mg'$ /ml Dexamethasone, and Manual therapy.  PLAN FOR NEXT SESSION: hip strengthening, balance training   Andris Baumann, PT 06/12/2022, 4:27 PM

## 2022-06-12 ENCOUNTER — Ambulatory Visit: Payer: Medicare Other

## 2022-06-12 DIAGNOSIS — R296 Repeated falls: Secondary | ICD-10-CM

## 2022-06-12 DIAGNOSIS — M6281 Muscle weakness (generalized): Secondary | ICD-10-CM

## 2022-06-12 DIAGNOSIS — M5459 Other low back pain: Secondary | ICD-10-CM | POA: Diagnosis not present

## 2022-06-12 DIAGNOSIS — R2689 Other abnormalities of gait and mobility: Secondary | ICD-10-CM | POA: Diagnosis not present

## 2022-06-18 ENCOUNTER — Ambulatory Visit: Payer: Medicare Other | Admitting: Physical Therapy

## 2022-06-18 ENCOUNTER — Encounter: Payer: Self-pay | Admitting: Physical Therapy

## 2022-06-18 DIAGNOSIS — M6281 Muscle weakness (generalized): Secondary | ICD-10-CM | POA: Diagnosis not present

## 2022-06-18 DIAGNOSIS — R2689 Other abnormalities of gait and mobility: Secondary | ICD-10-CM | POA: Diagnosis not present

## 2022-06-18 DIAGNOSIS — R296 Repeated falls: Secondary | ICD-10-CM

## 2022-06-18 DIAGNOSIS — M5459 Other low back pain: Secondary | ICD-10-CM | POA: Diagnosis not present

## 2022-06-18 NOTE — Therapy (Signed)
OUTPATIENT PHYSICAL THERAPY THORACOLUMBAR TREATMENT   Patient Name: LISAANN ATHA MRN: [id number] DOB:May 26, 1941, 81 y.o., female Today's Date: 06/18/2022   PT End of Session - 06/18/22 1345     Visit Number 5    Date for PT Re-Evaluation 08/23/22    PT Start Time 1345    PT Stop Time 1430    PT Time Calculation (min) 45 min    Activity Tolerance Patient tolerated treatment well    Behavior During Therapy WFL for tasks assessed/performed              Past Medical History:  Diagnosis Date   Anxiety    Arthritis HANDS, NECK , BACK   Asymptomatic carotid artery stenosis LEFT --  MOD. PER DR WILLIS NOTE OCT 2012   Benign heart murmur    Echo 2011 in [place]   Depression    PMH of   Diabetes mellitus [facility]   DVT (deep venous thrombosis) ([place]) 2006   after arthroscopy   Frequency of urination    Headache    History of CVA (cerebrovascular accident) NEUROLOGIST  DR WILLIS----  06-01-2010-  LEFT BASAL GANGLIA HEMORRAGE   RESIDUAL RIGHT HAND NUMBNESS/TINGLING   History of laparoscopic cholecystectomy 05/24/2021   Hyperlipidemia    Hypertension    Hypothyroid    MRSA (methicillin resistant staph aureus) culture positive    X3; last post TKR   Nocturia    Numbness and tingling in right hand RESIDUAL FROM CVA NOV 2011   Obesity    Redux therapy   Osteoporosis    Stroke [facility])    Thyroid disease    right thyroid nodule-- BX DONE 08-15-2011   Past Surgical History:  Procedure Laterality Date   ABDOMINAL HYSTERECTOMY  1973   Endometriosis & fibroid   CARPAL TUNNEL RELEASE  2000   RIGHT   CHOLECYSTECTOMY N/A 05/23/2021   Procedure: LAPAROSCOPIC CHOLECYSTECTOMY;  Surgeon: Coralie Keens, MD;  Location: [facility];  Service: General;  Laterality: N/A;   COLONOSCOPY  1999 & 2005   Dr Olevia Perches   cystoscope     to evaluate recurrent UTIs   CYSTOSCOPY W/ RETROGRADES  08/22/2011   Procedure: CYSTOSCOPY WITH RETROGRADE PYELOGRAM;  Surgeon: Molli Hazard, MD;  Location:  [facility];  Service: Urology;  Laterality: Bilateral;   CYSTOSCOPY WITH BIOPSY  08/22/2011   Procedure: CYSTOSCOPY WITH BIOPSY;  Surgeon: Molli Hazard, MD;  Location: [facility];  Service: Urology;  Laterality: N/A;   g2 p2     JOINT REPLACEMENT  03-28-2006   LEFT THUMB   KNEE ARTHROSCOPY     BILATERAL PRIOR TO TOTAL KNEE   LEFT SHOULDER SURG  1990'S   LUMBAR LAMINECTOMY  08-26-2009   L 4 - 5   THYROID NODULE BX  08-15-2011   RIGHT   TOTAL KNEE ARTHROPLASTY  05-26-2002      LEFT   AND RIGHT  03-30-2098   WRIST SURGERY  2014   tendon placed in joint; Dr Burney Gauze   Patient Active Problem List   Diagnosis Date Noted   Sciatica of right side 03/27/2022   Calculus of gallbladder with acute cholecystitis without obstruction 05/17/2021   Tingling 03/16/2021   Vitamin B12 deficiency 03/16/2021   Head injury due to trauma 02/23/2021   Low back pain 10/29/2019   Pain in left wrist 10/01/2019   Cervicalgia 07/29/2019   Trigger finger, left middle finger 04/28/2018   Hypothyroidism 03/29/2017   Fecal incontinence 09/25/2016  Generalized anxiety disorder 06/20/2015   SUI (stress urinary incontinence, female) 02/05/2014   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   Cerebral artery occlusion with cerebral infarction (HCC) 05/31/2010   Diabetes (HCC) 01/24/2010   THYROID NODULE, RIGHT 01/21/2009   Vitamin D deficiency 12/10/2007   HYPERLIPIDEMIA 06/17/2007   Essential hypertension 06/17/2007   Osteopenia 06/17/2007   Osteoarthrosis, unspecified whether generalized or localized, unspecified site 11/11/2006    PCP: Stacy Burns  REFERRING PROVIDER: Henry Pool  REFERRING DIAG: M54.16  Rationale for Evaluation and Treatment: Rehabilitation  THERAPY DIAG:  Muscle weakness (generalized)  Other low back pain  Repeated falls  ONSET DATE: "about a year ago"  SUBJECTIVE:                                                                                                                                                                                            SUBJECTIVE STATEMENT: Pretty good  PERTINENT HISTORY:  Lumbar laminectomy L4-5  PAIN:  Are you having pain? No  PRECAUTIONS: None  WEIGHT BEARING RESTRICTIONS: No  FALLS:  Has patient fallen in last 6 months? Yes. Number of falls 5-6  LIVING ENVIRONMENT: Lives with: lives with their daughter Lives in: House/apartment Stairs: No Has following equipment at home: Single point cane and Walker - 2 wheeled  OCCUPATION: Retired  PLOF: Independent  PATIENT GOALS: not to fall, get my balance better   NEXT MD VISIT:   OBJECTIVE:   SCREENING FOR RED FLAGS: Bowel or bladder incontinence: No Spinal tumors: No Cauda equina syndrome: No Compression fracture: No Abdominal aneurysm: No  COGNITION: Overall cognitive status: Within functional limits for tasks assessed     SENSATION: WFL  MUSCLE LENGTH: Hamstrings: tightness BLE  POSTURE: rounded shoulders and forward head   LUMBAR ROM:   AROM eval  Flexion Mid shin  Extension 50% w/pain  Right lateral flexion Pain at end range  Left lateral flexion Pain at end range  Right rotation Pain at end range  Left rotation Pain at end range   (Blank rows = not tested)  LOWER EXTREMITY ROM:   grossly WFL    LOWER EXTREMITY MMT:    MMT Right eval Left eval  Hip flexion 3+ 3+  Hip extension 3+ 3+  Hip abduction 3+ 3+  Hip adduction    Hip internal rotation    Hip external rotation    Knee flexion 4 4  Knee extension 4 4  Ankle dorsiflexion    Ankle plantarflexion    Ankle inversion    Ankle eversion     (Blank rows = not tested)  LUMBAR SPECIAL TESTS:  Straight leg   raise test: Positive  FUNCTIONAL TESTS:  5 times sit to stand: 24.30s Timed up and go (TUG): 16.13  GAIT: Distance walked: in clinic  Assistive device utilized: None Level of assistance: Modified independence Comments: poor foot  clearance, trendelenberg, decreased step length, antalgic gait, slowed speed   TODAY'S TREATMENT:                                                                                                                              DATE:  06/18/22 NuStep L5 x6 min Leg ext 10# 2x10 HS curls 25# 2x10 Rows & Lats 20lb 2x10 Lumbar Ext black band 2x10 S2S OHP yellow wball 2x10 5lb standing shoulder Ext 2x10  06/12/22 NuStep L5 x6mins  Leg ext 5# 2x10 HS curls 20# 2x10 Rows and lats 15# 2x10 Shoulder ext 5# 2x10 S2S on Airex 2x10- elevated mat table  Walking on beam  06/04/22 NuStep L5 x 7 min 30lb resisted gait 4 way x 3 each Hamstring curls 20lb 2x12 Leg Ext 5lb 2x10  Bridges 2x10 LE on Pball bridges, K2C, Oblq HS, piriformis, and glite stretch  06/04/22 Bike L3 x 6 min Hamstring curls 20lb 2x10 Leg Ext 5lb 2x10  Rows green 2x10  Shoulder Ext red 2x10 S2S 2x10 Bridges x10 LE on Pball bridges, K2C, Oblq     PATIENT EDUCATION:  Education details: HEP, hip and core weakness contributing to LBP, fall risk Person educated: Patient Education method: Explanation Education comprehension: verbalized understanding  HOME EXERCISE PROGRAM: Access Code: [email or web address] URL: [email or web address] Date: 05/31/2022 Prepared by: Mona Sajjad  Exercises - Supine Bridge  - 1 x daily - 7 x weekly - 2 sets - 10 reps - Clamshell with Resistance  - 1 x daily - 7 x weekly - 2 sets - 10 reps - Standing Hip Abduction with Counter Support  - 1 x daily - 7 x weekly - 2 sets - 10 reps - Standing Hip Extension with Counter Support  - 1 x daily - 7 x weekly - 2 sets - 10 reps  ASSESSMENT:  CLINICAL IMPRESSION: Patient enters doing well with no pain.  Continues with a progression of LE and balance interventions. Pt has progressed decreasing her TUG and 5X S2S time meeting goal. Some postural fatigue and weakness present with standing shoulder Ext.  OBJECTIVE IMPAIRMENTS: Abnormal gait,  decreased balance, difficulty walking, decreased strength, and pain.    PARTICIPATION LIMITATIONS: cleaning, laundry, and yard work  REHAB POTENTIAL: Good  CLINICAL DECISION MAKING: Stable/uncomplicated  EVALUATION COMPLEXITY: Low  GOALS: Goals reviewed with patient? No  SHORT TERM GOALS: Target date: 07/12/22  Patient will be independent with initial HEP. Goal status: Met 06/18/22  2.  Patient will be educated on strategies to decrease risk of falls.  Baseline: 5-6 falls in the last 6months Goal status: INITIAL   LONG TERM GOALS: Target date: 08/23/22  Patient will be independent with advanced/ongoing HEP to improve outcomes and carryover.  Goal status:   Ongoing   2.  Patient will demonstrate decreased fall risk by scoring < 12 sec on TUG. Baseline: 16.31s Goal status: Met 9.17 sec 06/18/22  3.  Patient will demonstrate decreased fall risk by scoring < 15 sec on 5xSTS. Baseline: 24.30s Goal status: Met 06/18/22 14.16 sec  4.  Patient will demonstrate improved functional LE strength as demonstrated by 5/5. Goal status: INITIAL  5.  Patient will score 52 on Berg Balance test to demonstrate lower risk of falls. (MCID= 8 points) .  Baseline: 44 Goal status: INITIAL   PLAN:  PT FREQUENCY: 2x/week  PT DURATION: 12 weeks  PLANNED INTERVENTIONS: Therapeutic exercises, Therapeutic activity, Neuromuscular re-education, Balance training, Gait training, Patient/Family education, Self Care, Joint mobilization, Stair training, Dry Needling, Electrical stimulation, Cryotherapy, Moist heat, Ionotophoresis 4mg/ml Dexamethasone, and Manual therapy.  PLAN FOR NEXT SESSION: hip strengthening, balance training    G , PTA 06/18/2022, 1:46 PM  

## 2022-06-20 ENCOUNTER — Ambulatory Visit: Payer: Medicare Other

## 2022-06-22 ENCOUNTER — Ambulatory Visit: Payer: Medicare Other | Attending: Neurosurgery

## 2022-06-22 DIAGNOSIS — R2689 Other abnormalities of gait and mobility: Secondary | ICD-10-CM | POA: Insufficient documentation

## 2022-06-22 DIAGNOSIS — M5459 Other low back pain: Secondary | ICD-10-CM | POA: Diagnosis not present

## 2022-06-22 DIAGNOSIS — R296 Repeated falls: Secondary | ICD-10-CM | POA: Insufficient documentation

## 2022-06-22 DIAGNOSIS — M6281 Muscle weakness (generalized): Secondary | ICD-10-CM | POA: Diagnosis not present

## 2022-06-22 NOTE — Therapy (Signed)
OUTPATIENT PHYSICAL THERAPY THORACOLUMBAR TREATMENT   Patient Name: [name] MRN: [id number] DOB:[birth year], 81 y.o., female Today's Date: 06/22/2022   PT End of Session - 06/22/22 0926     Visit Number 6    Date for PT Re-Evaluation 08/23/22    PT Start Time 0926    PT Stop Time 1010    PT Time Calculation (min) 44 min    Activity Tolerance Patient tolerated treatment well    Behavior During Therapy [employer] for tasks assessed/performed              Past Medical History:  Diagnosis Date   Anxiety    Arthritis HANDS, NECK , BACK   Asymptomatic carotid artery stenosis LEFT --  MOD. PER DR WILLIS NOTE OCT 2012   Benign heart murmur    Echo 2011 in [place]   Depression    PMH of   Diabetes mellitus [facility]   DVT (deep venous thrombosis) ([place]) 2006   after arthroscopy   Frequency of urination    Headache    History of CVA (cerebrovascular accident) NEUROLOGIST  DR WILLIS----  06-01-2010-  LEFT BASAL GANGLIA HEMORRAGE   RESIDUAL RIGHT HAND NUMBNESS/TINGLING   History of laparoscopic cholecystectomy 05/24/2021   Hyperlipidemia    Hypertension    Hypothyroid    MRSA (methicillin resistant staph aureus) culture positive    X3; last post TKR   Nocturia    Numbness and tingling in right hand RESIDUAL FROM CVA NOV 2011   Obesity    Redux therapy   Osteoporosis    Stroke [facility])    Thyroid disease    right thyroid nodule-- BX DONE 08-15-2011   Past Surgical History:  Procedure Laterality Date   ABDOMINAL HYSTERECTOMY  1973   Endometriosis & fibroid   CARPAL TUNNEL RELEASE  2000   RIGHT   CHOLECYSTECTOMY N/A 05/23/2021   Procedure: LAPAROSCOPIC CHOLECYSTECTOMY;  Surgeon: Coralie Keens, MD;  Location: [facility];  Service: General;  Laterality: N/A;   COLONOSCOPY  1999 & 2005   Dr Olevia Perches   cystoscope     to evaluate recurrent UTIs   CYSTOSCOPY W/ RETROGRADES  08/22/2011   Procedure: CYSTOSCOPY WITH RETROGRADE PYELOGRAM;  Surgeon: Molli Hazard, MD;  Location:  [facility];  Service: Urology;  Laterality: Bilateral;   CYSTOSCOPY WITH BIOPSY  08/22/2011   Procedure: CYSTOSCOPY WITH BIOPSY;  Surgeon: Molli Hazard, MD;  Location: [facility];  Service: Urology;  Laterality: N/A;   g2 p2     JOINT REPLACEMENT  03-28-2006   LEFT THUMB   KNEE ARTHROSCOPY     BILATERAL PRIOR TO TOTAL KNEE   LEFT SHOULDER SURG  1990'S   LUMBAR LAMINECTOMY  08-26-2009   L 4 - 5   THYROID NODULE BX  08-15-2011   RIGHT   TOTAL KNEE ARTHROPLASTY  05-26-2002      LEFT   AND RIGHT  03-30-2098   WRIST SURGERY  2014   tendon placed in joint; Dr Burney Gauze   Patient Active Problem List   Diagnosis Date Noted   Sciatica of right side 03/27/2022   Calculus of gallbladder with acute cholecystitis without obstruction 05/17/2021   Tingling 03/16/2021   Vitamin B12 deficiency 03/16/2021   Head injury due to trauma 02/23/2021   Low back pain 10/29/2019   Pain in left wrist 10/01/2019   Cervicalgia 07/29/2019   Trigger finger, left middle finger 04/28/2018   Hypothyroidism 03/29/2017   Fecal incontinence 09/25/2016  Generalized anxiety disorder 06/20/2015   SUI (stress urinary incontinence, female) 02/05/2014   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   Cerebral artery occlusion with cerebral infarction ([place]) 05/31/2010   Diabetes ([place]) 01/24/2010   THYROID NODULE, RIGHT 01/21/2009   Vitamin D deficiency 12/10/2007   HYPERLIPIDEMIA 06/17/2007   Essential hypertension 06/17/2007   Osteopenia 06/17/2007   Osteoarthrosis, unspecified whether generalized or localized, unspecified site 11/11/2006    PCP: Billey Gosling  REFERRING PROVIDER: Earnie Larsson  REFERRING DIAG: M54.16  Rationale for Evaluation and Treatment: Rehabilitation  THERAPY DIAG:  Muscle weakness (generalized)  Other low back pain  Other abnormalities of gait and mobility  Repeated falls  ONSET DATE: "about a year ago"  SUBJECTIVE:                                                                                                                                                                                            SUBJECTIVE STATEMENT: Not having pain just a little bit of soreness.   PERTINENT HISTORY:  Lumbar laminectomy L4-5  PAIN:  Are you having pain? No  PRECAUTIONS: None  WEIGHT BEARING RESTRICTIONS: No  FALLS:  Has patient fallen in last 6 months? Yes. Number of falls 5-6  LIVING ENVIRONMENT: Lives with: lives with their daughter Lives in: House/apartment Stairs: No Has following equipment at home: Single point cane and Environmental consultant - 2 wheeled  OCCUPATION: Retired  PLOF: Independent  PATIENT GOALS: not to fall, get my balance better   NEXT MD VISIT:   OBJECTIVE:   SCREENING FOR RED FLAGS: Bowel or bladder incontinence: No Spinal tumors: No Cauda equina syndrome: No Compression fracture: No Abdominal aneurysm: No  COGNITION: Overall cognitive status: Within functional limits for tasks assessed     SENSATION: WFL  MUSCLE LENGTH: Hamstrings: tightness BLE  POSTURE: rounded shoulders and forward head   LUMBAR ROM:   AROM eval  Flexion Mid shin  Extension 50% w/pain  Right lateral flexion Pain at end range  Left lateral flexion Pain at end range  Right rotation Pain at end range  Left rotation Pain at end range   (Blank rows = not tested)  LOWER EXTREMITY ROM:   grossly WFL    LOWER EXTREMITY MMT:    MMT Right eval Left eval  Hip flexion 3+ 3+  Hip extension 3+ 3+  Hip abduction 3+ 3+  Hip adduction    Hip internal rotation    Hip external rotation    Knee flexion 4 4  Knee extension 4 4  Ankle dorsiflexion    Ankle plantarflexion    Ankle inversion    Ankle eversion     (  Blank rows = not tested)  LUMBAR SPECIAL TESTS:  Straight leg raise test: Positive  FUNCTIONAL TESTS:  5 times sit to stand: 24.30s Timed up and go (TUG): 16.13  GAIT: Distance walked: in clinic  Assistive device  utilized: None Level of assistance: Modified independence Comments: poor foot clearance, trendelenberg, decreased step length, antalgic gait, slowed speed   TODAY'S TREATMENT:                                                                                                                              DATE:  06/22/22 NuStep L5 x45mns LE on Pball bridges, K2C, small bridge x10 each HS, piriformis, and glute stretch 30s each Bridges 2x10 S2S with red ball push out 2x10 Resisted gait 30# 4 x 4 way Rows and Lats 20# 2x10   06/18/22 NuStep L5 x6 min Leg ext 10# 2x10 HS curls 25# 2x10 Rows & Lats 20lb 2x10 Lumbar Ext black band 2x10 S2S OHP yellow wball 2x10 5lb standing shoulder Ext 2x10  06/12/22 NuStep L5 x681ms  Leg ext 5# 2x10 HS curls 20# 2x10 Rows and lats 15# 2x10 Shoulder ext 5# 2x10 S2S on Airex 2x10- elevated mat table  Walking on beam  06/04/22 NuStep L5 x 7 min 30lb resisted gait 4 way x 3 each Hamstring curls 20lb 2x12 Leg Ext 5lb 2x10  Bridges 2x10 LE on Pball bridges, K2C, Oblq HS, piriformis, and glite stretch  06/04/22 Bike L3 x 6 min Hamstring curls 20lb 2x10 Leg Ext 5lb 2x10  Rows green 2x10  Shoulder Ext red 2x10 S2S 2x10 Bridges x10 LE on Pball bridges, K2C, Oblq     PATIENT EDUCATION:  Education details: HEP, hip and core weakness contributing to LBP, fall risk Person educated: Patient Education method: Explanation Education comprehension: verbalized understanding  HOME EXERCISE PROGRAM: Access Code: [email or web address] URL: [email or web address] Date: 05/31/2022 Prepared by: MoAndris BaumannExercises - Supine Bridge  - 1 x daily - 7 x weekly - 2 sets - 10 reps - Clamshell with Resistance  - 1 x daily - 7 x weekly - 2 sets - 10 reps - Standing Hip Abduction with Counter Support  - 1 x daily - 7 x weekly - 2 sets - 10 reps - Standing Hip Extension with Counter Support  - 1 x daily - 7 x weekly - 2 sets - 10  reps  ASSESSMENT:  CLINICAL IMPRESSION: Patient enters doing well with no pain, some soreness after last visit states [facility] Continues with a strengthening and balance interventions. Does well with all interventions.   OBJECTIVE IMPAIRMENTS: Abnormal gait, decreased balance, difficulty walking, decreased strength, and pain.    PARTICIPATION LIMITATIONS: cleaning, laundry, and yard work  REBrink's CompanyOTENTIAL: Good  CLINICAL DECISION MAKING: Stable/uncomplicated  EVALUATION COMPLEXITY: Low  GOALS: Goals reviewed with patient? No  SHORT TERM GOALS: Target date: 07/12/22  Patient will be independent with initial HEP. Goal status:  Met 06/18/22  2.  Patient will be educated on strategies to decrease risk of falls.  Baseline: 5-6 falls in the last 40month Goal status: INITIAL   LONG TERM GOALS: Target date: 08/23/22  Patient will be independent with advanced/ongoing HEP to improve outcomes and carryover.  Goal status: Ongoing   2.  Patient will demonstrate decreased fall risk by scoring < 12 sec on TUG. Baseline: 16.31s Goal status: Met 9.17 sec 06/18/22  3.  Patient will demonstrate decreased fall risk by scoring < 15 sec on 5xSTS. Baseline: 24.30s Goal status: Met 06/18/22 14.16 sec  4.  Patient will demonstrate improved functional LE strength as demonstrated by 5/5. Goal status: INITIAL  5.  Patient will score 52 on Berg Balance test to demonstrate lower risk of falls. (MCID= 8 points) .  Baseline: 44 Goal status: INITIAL   PLAN:  PT FREQUENCY: 2x/week  PT DURATION: 12 weeks  PLANNED INTERVENTIONS: Therapeutic exercises, Therapeutic activity, Neuromuscular re-education, Balance training, Gait training, Patient/Family education, Self Care, Joint mobilization, Stair training, Dry Needling, Electrical stimulation, Cryotherapy, Moist heat, Ionotophoresis 47mml Dexamethasone, and Manual therapy.  PLAN FOR NEXT SESSION: hip strengthening,  balance training   MoAndris BaumannPT 06/22/2022, 10:11 AM

## 2022-06-25 ENCOUNTER — Ambulatory Visit: Payer: Medicare Other

## 2022-06-25 DIAGNOSIS — R296 Repeated falls: Secondary | ICD-10-CM

## 2022-06-25 DIAGNOSIS — M6281 Muscle weakness (generalized): Secondary | ICD-10-CM

## 2022-06-25 DIAGNOSIS — M5459 Other low back pain: Secondary | ICD-10-CM | POA: Diagnosis not present

## 2022-06-25 DIAGNOSIS — R2689 Other abnormalities of gait and mobility: Secondary | ICD-10-CM | POA: Diagnosis not present

## 2022-06-25 NOTE — Therapy (Signed)
OUTPATIENT PHYSICAL THERAPY THORACOLUMBAR TREATMENT   Patient Name: [name] MRN: [id number] DOB:[birth year], 81 y.o., female Today's Date: 06/25/2022   PT End of Session - 06/25/22 1106     Visit Number 7    Date for PT Re-Evaluation 08/23/22    PT Start Time 1105    PT Stop Time 1145    PT Time Calculation (min) 40 min    Activity Tolerance Patient tolerated treatment well    Behavior During Therapy WFL for tasks assessed/performed               Past Medical History:  Diagnosis Date   Anxiety    Arthritis HANDS, NECK , BACK   Asymptomatic carotid artery stenosis LEFT --  MOD. PER DR WILLIS NOTE OCT 2012   Benign heart murmur    Echo 2011 in [place]   Depression    PMH of   Diabetes mellitus ORAL MED   DVT (deep venous thrombosis) ([place]) 2006   after arthroscopy   Frequency of urination    Headache    History of CVA (cerebrovascular accident) NEUROLOGIST  DR WILLIS----  06-01-2010-  LEFT BASAL GANGLIA HEMORRAGE   RESIDUAL RIGHT HAND NUMBNESS/TINGLING   History of laparoscopic cholecystectomy 05/24/2021   Hyperlipidemia    Hypertension    Hypothyroid    MRSA (methicillin resistant staph aureus) culture positive    X3; last post TKR   Nocturia    Numbness and tingling in right hand RESIDUAL FROM CVA NOV 2011   Obesity    Redux therapy   Osteoporosis    Stroke [facility])    Thyroid disease    right thyroid nodule-- BX DONE 08-15-2011   Past Surgical History:  Procedure Laterality Date   ABDOMINAL HYSTERECTOMY  1973   Endometriosis & fibroid   CARPAL TUNNEL RELEASE  2000   RIGHT   CHOLECYSTECTOMY N/A 05/23/2021   Procedure: LAPAROSCOPIC CHOLECYSTECTOMY;  Surgeon: Coralie Keens, MD;  Location: [facility];  Service: General;  Laterality: N/A;   COLONOSCOPY  1999 & 2005   Dr Olevia Perches   cystoscope     to evaluate recurrent UTIs   CYSTOSCOPY W/ RETROGRADES  08/22/2011   Procedure: CYSTOSCOPY WITH RETROGRADE PYELOGRAM;  Surgeon: Molli Hazard, MD;  Location:  [facility];  Service: Urology;  Laterality: Bilateral;   CYSTOSCOPY WITH BIOPSY  08/22/2011   Procedure: CYSTOSCOPY WITH BIOPSY;  Surgeon: Molli Hazard, MD;  Location: [facility];  Service: Urology;  Laterality: N/A;   g2 p2     JOINT REPLACEMENT  03-28-2006   LEFT THUMB   KNEE ARTHROSCOPY     BILATERAL PRIOR TO TOTAL KNEE   LEFT SHOULDER SURG  1990'S   LUMBAR LAMINECTOMY  08-26-2009   L 4 - 5   THYROID NODULE BX  08-15-2011   RIGHT   TOTAL KNEE ARTHROPLASTY  05-26-2002      LEFT   AND RIGHT  03-30-2098   WRIST SURGERY  2014   tendon placed in joint; Dr Burney Gauze   Patient Active Problem List   Diagnosis Date Noted   Sciatica of right side 03/27/2022   Calculus of gallbladder with acute cholecystitis without obstruction 05/17/2021   Tingling 03/16/2021   Vitamin B12 deficiency 03/16/2021   Head injury due to trauma 02/23/2021   Low back pain 10/29/2019   Pain in left wrist 10/01/2019   Cervicalgia 07/29/2019   Trigger finger, left middle finger 04/28/2018   Hypothyroidism 03/29/2017   Fecal incontinence  09/25/2016   Generalized anxiety disorder 06/20/2015   SUI (stress urinary incontinence, female) 02/05/2014   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   Cerebral artery occlusion with cerebral infarction ([place]) 05/31/2010   Diabetes ([place]) 01/24/2010   THYROID NODULE, RIGHT 01/21/2009   Vitamin D deficiency 12/10/2007   HYPERLIPIDEMIA 06/17/2007   Essential hypertension 06/17/2007   Osteopenia 06/17/2007   Osteoarthrosis, unspecified whether generalized or localized, unspecified site 11/11/2006    PCP: Billey Gosling  REFERRING PROVIDER: Earnie Larsson  REFERRING DIAG: M54.16  Rationale for Evaluation and Treatment: Rehabilitation  THERAPY DIAG:  Muscle weakness (generalized)  Other low back pain  Other abnormalities of gait and mobility  Repeated falls  ONSET DATE: "about a year ago"  SUBJECTIVE:                                                                                                                                                                                            SUBJECTIVE STATEMENT: The back feels good, no pain. I am better but still think I can use some more PT to work on strength and balance.   PERTINENT HISTORY:  Lumbar laminectomy L4-5  PAIN:  Are you having pain? No  PRECAUTIONS: None  WEIGHT BEARING RESTRICTIONS: No  FALLS:  Has patient fallen in last 6 months? Yes. Number of falls 5-6  LIVING ENVIRONMENT: Lives with: lives with their daughter Lives in: House/apartment Stairs: No Has following equipment at home: Single point cane and Environmental consultant - 2 wheeled  OCCUPATION: Retired  PLOF: Independent  PATIENT GOALS: not to fall, get my balance better   NEXT MD VISIT:   OBJECTIVE:   SCREENING FOR RED FLAGS: Bowel or bladder incontinence: No Spinal tumors: No Cauda equina syndrome: No Compression fracture: No Abdominal aneurysm: No  COGNITION: Overall cognitive status: Within functional limits for tasks assessed     SENSATION: WFL  MUSCLE LENGTH: Hamstrings: tightness BLE  POSTURE: rounded shoulders and forward head   LUMBAR ROM:   AROM eval  Flexion Mid shin  Extension 50% w/pain  Right lateral flexion Pain at end range  Left lateral flexion Pain at end range  Right rotation Pain at end range  Left rotation Pain at end range   (Blank rows = not tested)  LOWER EXTREMITY ROM:   grossly WFL    LOWER EXTREMITY MMT:    MMT Right eval Left eval  Hip flexion 3+ 3+  Hip extension 3+ 3+  Hip abduction 3+ 3+  Hip adduction    Hip internal rotation    Hip external rotation    Knee flexion 4 4  Knee extension 4 4  Ankle dorsiflexion    Ankle plantarflexion    Ankle inversion    Ankle eversion     (Blank rows = not tested)  LUMBAR SPECIAL TESTS:  Straight leg raise test: Positive  FUNCTIONAL TESTS:  5 times sit to stand: 24.30s Timed up and go  (TUG): 16.13  GAIT: Distance walked: in clinic  Assistive device utilized: None Level of assistance: Modified independence Comments: poor foot clearance, trendelenberg, decreased step length, antalgic gait, slowed speed   TODAY'S TREATMENT:                                                                                                                              DATE:  06/25/22 Bike L2 x101mns  Leg ext 10# 2x10 HS curls 25# 2x10 STS on airex 2x10 Walking beam side steps and narrow stance Shoulder ext 5# x10, 10# x10  Cable rows 5# 2x10  Leg press 20# 2x10  06/22/22 NuStep L5 x666ms LE on Pball bridges, K2C, small bridge x10 each HS, piriformis, and glute stretch 30s each Bridges 2x10 S2S with red ball push out 2x10 Resisted gait 30# 4 x 4 way Rows and Lats 20# 2x10   06/18/22 NuStep L5 x6 min Leg ext 10# 2x10 HS curls 25# 2x10 Rows & Lats 20lb 2x10 Lumbar Ext black band 2x10 S2S OHP yellow wball 2x10 5lb standing shoulder Ext 2x10  06/12/22 NuStep L5 x6m12m  Leg ext 5# 2x10 HS curls 20# 2x10 Rows and lats 15# 2x10 Shoulder ext 5# 2x10 S2S on Airex 2x10- elevated mat table  Walking on beam  06/04/22 NuStep L5 x 7 min 30lb resisted gait 4 way x 3 each Hamstring curls 20lb 2x12 Leg Ext 5lb 2x10  Bridges 2x10 LE on Pball bridges, K2C, Oblq HS, piriformis, and glite stretch  06/04/22 Bike L3 x 6 min Hamstring curls 20lb 2x10 Leg Ext 5lb 2x10  Rows green 2x10  Shoulder Ext red 2x10 S2S 2x10 Bridges x10 LE on Pball bridges, K2C, Oblq     PATIENT EDUCATION:  Education details: HEP, hip and core weakness contributing to LBP, fall risk Person educated: Patient Education method: Explanation Education comprehension: verbalized understanding  HOME EXERCISE PROGRAM: Access Code: [email or web address] URL: [email or web address] Date: 05/31/2022 Prepared by: MonAndris Baumannxercises - Supine Bridge  - 1 x daily - 7 x weekly - 2 sets - 10 reps -  Clamshell with Resistance  - 1 x daily - 7 x weekly - 2 sets - 10 reps - Standing Hip Abduction with Counter Support  - 1 x daily - 7 x weekly - 2 sets - 10 reps - Standing Hip Extension with Counter Support  - 1 x daily - 7 x weekly - 2 sets - 10 reps  ASSESSMENT:  CLINICAL IMPRESSION: Patient enters doing well with no pain, would like to continue with PT to work on strength and balance. Most difficulty with walking on beam. Requires cues with cable exercises for form.  Does well with all interventions.   OBJECTIVE IMPAIRMENTS: Abnormal gait, decreased balance, difficulty walking, decreased strength, and pain.    PARTICIPATION LIMITATIONS: cleaning, laundry, and yard work  [employer] POTENTIAL: Good  CLINICAL DECISION MAKING: Stable/uncomplicated  EVALUATION COMPLEXITY: Low  GOALS: Goals reviewed with patient? No  SHORT TERM GOALS: Target date: 07/12/22  Patient will be independent with initial HEP. Goal status: Met 06/18/22  2.  Patient will be educated on strategies to decrease risk of falls.  Baseline: 5-6 falls in the last 33month Goal status: INITIAL   LONG TERM GOALS: Target date: 08/23/22  Patient will be independent with advanced/ongoing HEP to improve outcomes and carryover.  Goal status: Ongoing   2.  Patient will demonstrate decreased fall risk by scoring < 12 sec on TUG. Baseline: 16.31s Goal status: Met 9.17 sec 06/18/22  3.  Patient will demonstrate decreased fall risk by scoring < 15 sec on 5xSTS. Baseline: 24.30s Goal status: Met 06/18/22 14.16 sec  4.  Patient will demonstrate improved functional LE strength as demonstrated by 5/5. Goal status: INITIAL  5.  Patient will score 52 on Berg Balance test to demonstrate lower risk of falls. (MCID= 8 points) .  Baseline: 44 Goal status: INITIAL   PLAN:  PT FREQUENCY: 2x/week  PT DURATION: 12 weeks  PLANNED INTERVENTIONS: Therapeutic exercises, Therapeutic activity, Neuromuscular re-education, Balance  training, Gait training, Patient/Family education, Self Care, Joint mobilization, Stair training, Dry Needling, Electrical stimulation, Cryotherapy, Moist heat, Ionotophoresis 470mml Dexamethasone, and Manual therapy.  PLAN FOR NEXT SESSION: hip strengthening, balance training   MoAndris BaumannPT 06/25/2022, 11:43 AM

## 2022-06-27 NOTE — Therapy (Signed)
OUTPATIENT PHYSICAL THERAPY THORACOLUMBAR TREATMENT   Patient Name: [name] MRN: [id number] DOB:1940/09/19, 81 y.o., female Today's Date: 06/28/2022   PT End of Session - 06/28/22 1100     Visit Number 8    Date for PT Re-Evaluation 08/23/22    PT Start Time 1100    PT Stop Time 1145    PT Time Calculation (min) 45 min    Activity Tolerance Patient tolerated treatment well    Behavior During Therapy WFL for tasks assessed/performed               Past Medical History:  Diagnosis Date   Anxiety    Arthritis HANDS, NECK , BACK   Asymptomatic carotid artery stenosis LEFT --  MOD. PER DR WILLIS NOTE OCT 2012   Benign heart murmur    Echo 2011 in [place]   Depression    PMH of   Diabetes mellitus [facility]AL [facility]   DVT (deep venous thrombosis) ([place]) 2006   after arthroscopy   Frequency of urination    Headache    History of CVA (cerebrovascular accident) NEUROLOGIST  DR WILLIS----  06-01-2010-  LEFT BASAL GANGLIA HEMORRAGE   RESIDUAL RIGHT HAND NUMBNESS/TINGLING   History of laparoscopic cholecystectomy 05/24/2021   Hyperlipidemia    Hypertension    Hypothyroid    MRSA (methicillin resistant staph aureus) culture positive    X3; last post TKR   Nocturia    Numbness and tingling in right hand RESIDUAL FROM CVA NOV 2011   Obesity    Redux therapy   Osteoporosis    Stroke [facility])    Thyroid disease    right thyroid nodule-- BX DONE 08-15-2011   Past Surgical History:  Procedure Laterality Date   ABDOMINAL HYSTERECTOMY  1973   Endometriosis & fibroid   CARPAL TUNNEL RELEASE  2000   RIGHT   CHOLECYSTECTOMY N/A 05/23/2021   Procedure: LAPAROSCOPIC CHOLECYSTECTOMY;  Surgeon: Coralie Keens, MD;  Location: [facility];  Service: General;  Laterality: N/A;   COLONOSCOPY  1999 & 2005   Dr Olevia Perches   cystoscope     to evaluate recurrent UTIs   CYSTOSCOPY W/ RETROGRADES  08/22/2011   Procedure: CYSTOSCOPY WITH RETROGRADE PYELOGRAM;  Surgeon: Molli Hazard, MD;  [facility]:  [facility];  Service: Urology;  Laterality: Bilateral;   CYSTOSCOPY WITH BIOPSY  08/22/2011   Procedure: CYSTOSCOPY WITH BIOPSY;  Surgeon: Molli Hazard, MD;  Location: [facility];  Service: Urology;  Laterality: N/A;   g2 p2     JOINT REPLACEMENT  03-28-2006   LEFT THUMB   KNEE ARTHROSCOPY     BILATERAL PRIOR TO TOTAL KNEE   LEFT SHOULDER SURG  1990'S   LUMBAR LAMINECTOMY  08-26-2009   L 4 - 5   THYROID NODULE BX  08-15-2011   RIGHT   TOTAL KNEE ARTHROPLASTY  05-26-2002      LEFT   AND RIGHT  03-30-2098   WRIST SURGERY  2014   tendon placed in joint; Dr Burney Gauze   Patient Active Problem List   Diagnosis Date Noted   Sciatica of right side 03/27/2022   Calculus of gallbladder with acute cholecystitis without obstruction 05/17/2021   Tingling 03/16/2021   Vitamin B12 deficiency 03/16/2021   Head injury due to trauma 02/23/2021   Low back pain 10/29/2019   Pain in left wrist 10/01/2019   Cervicalgia 07/29/2019   Trigger finger, left middle finger 04/28/2018   Hypothyroidism 03/29/2017   Fecal incontinence  09/25/2016   Generalized anxiety disorder 06/20/2015   SUI (stress urinary incontinence, female) 02/05/2014   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   Cerebral artery occlusion with cerebral infarction ([place]) 05/31/2010   Diabetes ([place]) 01/24/2010   THYROID NODULE, RIGHT 01/21/2009   Vitamin D deficiency 12/10/2007   HYPERLIPIDEMIA 06/17/2007   Essential hypertension 06/17/2007   Osteopenia 06/17/2007   Osteoarthrosis, unspecified whether generalized or localized, unspecified site 11/11/2006    PCP: Billey Gosling  REFERRING PROVIDER: Earnie Larsson  REFERRING DIAG: M54.16  Rationale for Evaluation and Treatment: Rehabilitation  THERAPY DIAG:  Muscle weakness (generalized)  Other low back pain  Repeated falls  Other abnormalities of gait and mobility  ONSET DATE: "about a year ago"  SUBJECTIVE:                                                                                                                                                                                            SUBJECTIVE STATEMENT: I am doing okay, not having pain.   PERTINENT HISTORY:  Lumbar laminectomy L4-5  PAIN:  Are you having pain? No  PRECAUTIONS: None  WEIGHT BEARING RESTRICTIONS: No  FALLS:  Has patient fallen in last 6 months? Yes. Number of falls 5-6  LIVING ENVIRONMENT: Lives with: lives with their daughter Lives in: House/apartment Stairs: No Has following equipment at home: Single point cane and Environmental consultant - 2 wheeled  OCCUPATION: Retired  PLOF: Independent  PATIENT GOALS: not to fall, get my balance better   NEXT MD VISIT:   OBJECTIVE:   SCREENING FOR RED FLAGS: Bowel or bladder incontinence: No Spinal tumors: No Cauda equina syndrome: No Compression fracture: No Abdominal aneurysm: No  COGNITION: Overall cognitive status: Within functional limits for tasks assessed     SENSATION: WFL  MUSCLE LENGTH: Hamstrings: tightness BLE  POSTURE: rounded shoulders and forward head   LUMBAR ROM:   AROM eval  Flexion Mid shin  Extension 50% w/pain  Right lateral flexion Pain at end range  Left lateral flexion Pain at end range  Right rotation Pain at end range  Left rotation Pain at end range   (Blank rows = not tested)  LOWER EXTREMITY ROM:   grossly WFL    LOWER EXTREMITY MMT:    MMT Right eval Left eval  Hip flexion 3+ 3+  Hip extension 3+ 3+  Hip abduction 3+ 3+  Hip adduction    Hip internal rotation    Hip external rotation    Knee flexion 4 4  Knee extension 4 4  Ankle dorsiflexion    Ankle plantarflexion    Ankle inversion    Ankle eversion     (  Blank rows = not tested)  LUMBAR SPECIAL TESTS:  Straight leg raise test: Positive  FUNCTIONAL TESTS:  5 times sit to stand: 24.30s Timed up and go (TUG): 16.13  GAIT: Distance walked: in clinic  Assistive device utilized:  None Level of assistance: Modified independence Comments: poor foot clearance, trendelenberg, decreased step length, antalgic gait, slowed speed   TODAY'S TREATMENT:                                                                                                                              DATE:  06/28/22 Bike L2 x52mns  Rows and Lats 15# 2x10 BlackTB ext 2x10 BlackTB crunches 2x10 Supine stretching HS, IT band, piriformis Bridges with green band abd 2x10 SL clamshells greenTB 2x10  06/25/22 Bike L2 x699ms  Leg ext 10# 2x10 HS curls 25# 2x10 STS on airex 2x10 Walking beam side steps and narrow stance Shoulder ext 5# x10, 10# x10  Cable rows 5# 2x10  Leg press 20# 2x10  06/22/22 NuStep L5 x6m43m LE on Pball bridges, K2C, small bridge x10 each HS, piriformis, and glute stretch 30s each Bridges 2x10 S2S with red ball push out 2x10 Resisted gait 30# 4 x 4 way Rows and Lats 20# 2x10   06/18/22 NuStep L5 x6 min Leg ext 10# 2x10 HS curls 25# 2x10 Rows & Lats 20lb 2x10 Lumbar Ext black band 2x10 S2S OHP yellow wball 2x10 5lb standing shoulder Ext 2x10  06/12/22 NuStep L5 x6mi21m Leg ext 5# 2x10 HS curls 20# 2x10 Rows and lats 15# 2x10 Shoulder ext 5# 2x10 S2S on Airex 2x10- elevated mat table  Walking on beam  06/04/22 NuStep L5 x 7 min 30lb resisted gait 4 way x 3 each Hamstring curls 20lb 2x12 Leg Ext 5lb 2x10  Bridges 2x10 LE on Pball bridges, K2C, Oblq HS, piriformis, and glite stretch  06/04/22 Bike L3 x 6 min Hamstring curls 20lb 2x10 Leg Ext 5lb 2x10  Rows green 2x10  Shoulder Ext red 2x10 S2S 2x10 Bridges x10 LE on Pball bridges, K2C, Oblq     PATIENT EDUCATION:  Education details: HEP, hip and core weakness contributing to LBP, fall risk Person educated: Patient Education method: Explanation Education comprehension: verbalized understanding  HOME EXERCISE PROGRAM: Access Code: [email or web address] URL: [email or web address] Date:  05/31/2022 Prepared by: MonaAndris Baumannercises - Supine Bridge  - 1 x daily - 7 x weekly - 2 sets - 10 reps - Clamshell with Resistance  - 1 x daily - 7 x weekly - 2 sets - 10 reps - Standing Hip Abduction with Counter Support  - 1 x daily - 7 x weekly - 2 sets - 10 reps - Standing Hip Extension with Counter Support  - 1 x daily - 7 x weekly - 2 sets - 10 reps  ASSESSMENT:  CLINICAL IMPRESSION: Patient enters doing well with no pain, we continued with back strengthening. Also did some core exercises. Try more balance tasks  and LE strengthening to decrease her risk for falls next visit.  OBJECTIVE IMPAIRMENTS: Abnormal gait, decreased balance, difficulty walking, decreased strength, and pain.    PARTICIPATION LIMITATIONS: cleaning, laundry, and yard work  [employer] POTENTIAL: Good  CLINICAL DECISION MAKING: Stable/uncomplicated  EVALUATION COMPLEXITY: Low  GOALS: Goals reviewed with patient? No  SHORT TERM GOALS: Target date: 07/12/22  Patient will be independent with initial HEP. Goal status: Met 06/18/22  2.  Patient will be educated on strategies to decrease risk of falls.  Baseline: 5-6 falls in the last 29month Goal status: INITIAL   LONG TERM GOALS: Target date: 08/23/22  Patient will be independent with advanced/ongoing HEP to improve outcomes and carryover.  Goal status: Ongoing   2.  Patient will demonstrate decreased fall risk by scoring < 12 sec on TUG. Baseline: 16.31s Goal status: Met 9.17 sec 06/18/22  3.  Patient will demonstrate decreased fall risk by scoring < 15 sec on 5xSTS. Baseline: 24.30s Goal status: Met 06/18/22 14.16 sec  4.  Patient will demonstrate improved functional LE strength as demonstrated by 5/5. Goal status: INITIAL  5.  Patient will score 52 on Berg Balance test to demonstrate lower risk of falls. (MCID= 8 points) .  Baseline: 44 Goal status: INITIAL   PLAN:  PT FREQUENCY: 2x/week  PT DURATION: 12 weeks  PLANNED  INTERVENTIONS: Therapeutic exercises, Therapeutic activity, Neuromuscular re-education, Balance training, Gait training, Patient/Family education, Self Care, Joint mobilization, Stair training, Dry Needling, Electrical stimulation, Cryotherapy, Moist heat, Ionotophoresis 478mml Dexamethasone, and Manual therapy.  PLAN FOR NEXT SESSION: LE/hip strengthening, balance training (step ups on airex, side steps on airex, walking on beam, STS on airex), maybe recheck BEFlorida Outpatient Surgery Center LtdPT 06/28/2022, 11:44 AM

## 2022-06-28 ENCOUNTER — Ambulatory Visit: Payer: Medicare Other

## 2022-06-28 ENCOUNTER — Encounter: Payer: Self-pay | Admitting: Internal Medicine

## 2022-06-28 DIAGNOSIS — R2689 Other abnormalities of gait and mobility: Secondary | ICD-10-CM | POA: Diagnosis not present

## 2022-06-28 DIAGNOSIS — M5459 Other low back pain: Secondary | ICD-10-CM | POA: Diagnosis not present

## 2022-06-28 DIAGNOSIS — R296 Repeated falls: Secondary | ICD-10-CM | POA: Diagnosis not present

## 2022-06-28 DIAGNOSIS — M6281 Muscle weakness (generalized): Secondary | ICD-10-CM | POA: Diagnosis not present

## 2022-06-28 NOTE — Progress Notes (Signed)
Outside notes received. Information abstracted. Notes sent to scan.  

## 2022-07-03 ENCOUNTER — Ambulatory Visit: Payer: Medicare Other | Admitting: Physical Therapy

## 2022-07-05 ENCOUNTER — Ambulatory Visit: Payer: Medicare Other | Admitting: Physical Therapy

## 2022-07-05 ENCOUNTER — Encounter: Payer: Self-pay | Admitting: Physical Therapy

## 2022-07-05 DIAGNOSIS — R2689 Other abnormalities of gait and mobility: Secondary | ICD-10-CM

## 2022-07-05 DIAGNOSIS — R296 Repeated falls: Secondary | ICD-10-CM | POA: Diagnosis not present

## 2022-07-05 DIAGNOSIS — M5459 Other low back pain: Secondary | ICD-10-CM

## 2022-07-05 DIAGNOSIS — M6281 Muscle weakness (generalized): Secondary | ICD-10-CM

## 2022-07-05 NOTE — Therapy (Signed)
OUTPATIENT PHYSICAL THERAPY THORACOLUMBAR TREATMENT   Patient Name: [name] MRN: [id number] DOB:04/21/1941, 81 y.o., female Today's Date: 07/05/2022   PT End of Session - 07/05/22 1430     Visit Number 9    Date for PT Re-Evaluation 08/23/22    PT Start Time 1430    PT Stop Time 1515    PT Time Calculation (min) 45 min    Activity Tolerance Patient tolerated treatment well    Behavior During Therapy WFL for tasks assessed/performed               Past Medical History:  Diagnosis Date   Anxiety    Arthritis HANDS, NECK , BACK   Asymptomatic carotid artery stenosis LEFT --  MOD. PER DR WILLIS NOTE OCT 2012   Benign heart murmur    Echo 2011 in [place]   Depression    PMH of   Diabetes mellitus ORAL MED   DVT (deep venous thrombosis) ([place]) 2006   after arthroscopy   Frequency of urination    Headache    History of CVA (cerebrovascular accident) NEUROLOGIST  DR WILLIS----  06-01-2010-  LEFT BASAL GANGLIA HEMORRAGE   RESIDUAL RIGHT HAND NUMBNESS/TINGLING   History of laparoscopic cholecystectomy 05/24/2021   Hyperlipidemia    Hypertension    Hypothyroid    MRSA (methicillin resistant staph aureus) culture positive    X3; last post TKR   Nocturia    Numbness and tingling in right hand RESIDUAL FROM CVA NOV 2011   Obesity    Redux therapy   Osteoporosis    Stroke [facility])    Thyroid disease    right thyroid nodule-- BX DONE 08-15-2011   Past Surgical History:  Procedure Laterality Date   ABDOMINAL HYSTERECTOMY  1973   Endometriosis & fibroid   CARPAL TUNNEL RELEASE  2000   RIGHT   CHOLECYSTECTOMY N/A 05/23/2021   Procedure: LAPAROSCOPIC CHOLECYSTECTOMY;  Surgeon: Coralie Keens, MD;  Location: [facility];  Service: General;  Laterality: N/A;   COLONOSCOPY  1999 & 2005   Dr Olevia Perches   cystoscope     to evaluate recurrent UTIs   CYSTOSCOPY W/ RETROGRADES  08/22/2011   Procedure: CYSTOSCOPY WITH RETROGRADE PYELOGRAM;  Surgeon: Molli Hazard, MD;  Location:  [facility];  Service: Urology;  Laterality: Bilateral;   CYSTOSCOPY WITH BIOPSY  08/22/2011   Procedure: CYSTOSCOPY WITH BIOPSY;  Surgeon: Molli Hazard, MD;  Location: [facility];  Service: Urology;  Laterality: N/A;   g2 p2     JOINT REPLACEMENT  03-28-2006   LEFT THUMB   KNEE ARTHROSCOPY     BILATERAL PRIOR TO TOTAL KNEE   LEFT SHOULDER SURG  1990'S   LUMBAR LAMINECTOMY  08-26-2009   L 4 - 5   THYROID NODULE BX  08-15-2011   RIGHT   TOTAL KNEE ARTHROPLASTY  05-26-2002      LEFT   AND RIGHT  03-30-2098   WRIST SURGERY  2014   tendon placed in joint; Dr Burney Gauze   Patient Active Problem List   Diagnosis Date Noted   Sciatica of right side 03/27/2022   Calculus of gallbladder with acute cholecystitis without obstruction 05/17/2021   Tingling 03/16/2021   Vitamin B12 deficiency 03/16/2021   Head injury due to trauma 02/23/2021   Low back pain 10/29/2019   Pain in left wrist 10/01/2019   Cervicalgia 07/29/2019   Trigger finger, left middle finger 04/28/2018   Hypothyroidism 03/29/2017   Fecal incontinence  09/25/2016   Generalized anxiety disorder 06/20/2015   SUI (stress urinary incontinence, female) 02/05/2014   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   Cerebral artery occlusion with cerebral infarction ([place]) 05/31/2010   Diabetes ([place]) 01/24/2010   THYROID NODULE, RIGHT 01/21/2009   Vitamin D deficiency 12/10/2007   HYPERLIPIDEMIA 06/17/2007   Essential hypertension 06/17/2007   Osteopenia 06/17/2007   Osteoarthrosis, unspecified whether generalized or localized, unspecified site 11/11/2006    PCP: Billey Gosling  REFERRING PROVIDER: Earnie Larsson  REFERRING DIAG: M54.16  Rationale for Evaluation and Treatment: Rehabilitation  THERAPY DIAG:  Muscle weakness (generalized)  Other low back pain  Repeated falls  Other abnormalities of gait and mobility  ONSET DATE: "about a year ago"  SUBJECTIVE:                                                                                                                                                                                            SUBJECTIVE STATEMENT: "Still kind of weak" Don't have any back pain  PERTINENT HISTORY:  Lumbar laminectomy L4-5  PAIN:  Are you having pain? No  PRECAUTIONS: None  WEIGHT BEARING RESTRICTIONS: No  FALLS:  Has patient fallen in last 6 months? Yes. Number of falls 5-6  LIVING ENVIRONMENT: Lives with: lives with their daughter Lives in: House/apartment Stairs: No Has following equipment at home: Single point cane and Environmental consultant - 2 wheeled  OCCUPATION: Retired  PLOF: Independent  PATIENT GOALS: not to fall, get my balance better   NEXT MD VISIT:   OBJECTIVE:   SCREENING FOR RED FLAGS: Bowel or bladder incontinence: No Spinal tumors: No Cauda equina syndrome: No Compression fracture: No Abdominal aneurysm: No  COGNITION: Overall cognitive status: Within functional limits for tasks assessed     SENSATION: WFL  MUSCLE LENGTH: Hamstrings: tightness BLE  POSTURE: rounded shoulders and forward head   LUMBAR ROM:   AROM eval  Flexion Mid shin  Extension 50% w/pain  Right lateral flexion Pain at end range  Left lateral flexion Pain at end range  Right rotation Pain at end range  Left rotation Pain at end range   (Blank rows = not tested)  LOWER EXTREMITY ROM:   grossly WFL    LOWER EXTREMITY MMT:    MMT Right eval Left eval  Hip flexion 3+ 3+  Hip extension 3+ 3+  Hip abduction 3+ 3+  Hip adduction    Hip internal rotation    Hip external rotation    Knee flexion 4 4  Knee extension 4 4  Ankle dorsiflexion    Ankle plantarflexion    Ankle inversion    Ankle  eversion     (Blank rows = not tested)  LUMBAR SPECIAL TESTS:  Straight leg raise test: Positive  FUNCTIONAL TESTS:  5 times sit to stand: 24.30s Timed up and go (TUG): 16.13  GAIT: Distance walked: in clinic  Assistive device  utilized: None Level of assistance: Modified independence Comments: poor foot clearance, trendelenberg, decreased step length, antalgic gait, slowed speed   TODAY'S TREATMENT:                                                                                                                              DATE:  07/05/22 Bike L2 x 6 min Rows and Lats 15# 2x10 Standing shoulder Extension 5lb 2x10 S2S holding yellow ball 2x10     06/28/22 Bike L2 x33mns  Rows and Lats 105# 2x10 BlackTB ext 2x10 BlackTB crunches 2x10 Supine stretching HS, IT band, piriformis Bridges with green band abd 2x10 SL clamshells greenTB 2x10 Leg ext 5# 2x10 HS curls 20# 2x10  06/25/22 Bike L2 x667ms  Leg ext 10# 2x10 HS curls 25# 2x10 STS on airex 2x10 Walking beam side steps and narrow stance Shoulder ext 5# x10, 10# x10  Cable rows 5# 2x10  Leg press 20# 2x10  06/22/22 NuStep L5 x6m18m LE on Pball bridges, K2C, small bridge x10 each HS, piriformis, and glute stretch 30s each Bridges 2x10 S2S with red ball push out 2x10 Resisted gait 30# 4 x 4 way Rows and Lats 20# 2x10   06/18/22 NuStep L5 x6 min Leg ext 10# 2x10 HS curls 25# 2x10 Rows & Lats 20lb 2x10 Lumbar Ext black band 2x10 S2S OHP yellow wball 2x10 5lb standing shoulder Ext 2x10  06/12/22 NuStep L5 x6mi3m Leg ext 5# 2x10 HS curls 20# 2x10 Rows and lats 15# 2x10 Shoulder ext 5# 2x10 S2S on Airex 2x10- elevated mat table  Walking on beam  06/04/22 NuStep L5 x 7 min 30lb resisted gait 4 way x 3 each Hamstring curls 20lb 2x12 Leg Ext 5lb 2x10  Bridges 2x10 LE on Pball bridges, K2C, Oblq HS, piriformis, and glite stretch  06/04/22 Bike L3 x 6 min Hamstring curls 20lb 2x10 Leg Ext 5lb 2x10  Rows green 2x10  Shoulder Ext red 2x10 S2S 2x10 Bridges x10 LE on Pball bridges, K2C, Oblq     PATIENT EDUCATION:  Education details: HEP, hip and core weakness contributing to LBP, fall risk Person educated:  Patient Education method: Explanation Education comprehension: verbalized understanding  HOME EXERCISE PROGRAM: Access Code: [email or web address] URL: [email or web address] Date: 05/31/2022 Prepared by: MonaAndris Baumannercises - Supine Bridge  - 1 x daily - 7 x weekly - 2 sets - 10 reps - Clamshell with Resistance  - 1 x daily - 7 x weekly - 2 sets - 10 reps - Standing Hip Abduction with Counter Support  - 1 x daily - 7 x weekly - 2 sets - 10 reps - Standing Hip Extension with Counter Support  - 1  x daily - 7 x weekly - 2 sets - 10 reps  ASSESSMENT:  CLINICAL IMPRESSION: Patient enters doing well with no pain. She stated she was just getting over a cold and has some fatigue. Session adjusted with less intensities and or reps due to pt subjective reports. Increase rest given between each intervention. Postural cues needed with shoulder Ext. Ceu for LE placement needed with sit to stands.    OBJECTIVE IMPAIRMENTS: Abnormal gait, decreased balance, difficulty walking, decreased strength, and pain.    PARTICIPATION LIMITATIONS: cleaning, laundry, and yard work  [employer] POTENTIAL: Good  CLINICAL DECISION MAKING: Stable/uncomplicated  EVALUATION COMPLEXITY: Low  GOALS: Goals reviewed with patient? No  SHORT TERM GOALS: Target date: 07/12/22  Patient will be independent with initial HEP. Goal status: Met 06/18/22  2.  Patient will be educated on strategies to decrease risk of falls.  Baseline: 5-6 falls in the last 72month Goal status: INITIAL   LONG TERM GOALS: Target date: 08/23/22  Patient will be independent with advanced/ongoing HEP to improve outcomes and carryover.  Goal status: Ongoing   2.  Patient will demonstrate decreased fall risk by scoring < 12 sec on TUG. Baseline: 16.31s Goal status: Met 9.17 sec 06/18/22  3.  Patient will demonstrate decreased fall risk by scoring < 15 sec on 5xSTS. Baseline: 24.30s Goal status: Met 06/18/22 14.16 sec  4.  Patient  will demonstrate improved functional LE strength as demonstrated by 5/5. Goal status: INITIAL  5.  Patient will score 52 on Berg Balance test to demonstrate lower risk of falls. (MCID= 8 points) .  Baseline: 44 Goal status: INITIAL   PLAN:  PT FREQUENCY: 2x/week  PT DURATION: 12 weeks  PLANNED INTERVENTIONS: Therapeutic exercises, Therapeutic activity, Neuromuscular re-education, Balance training, Gait training, Patient/Family education, Self Care, Joint mobilization, Stair training, Dry Needling, Electrical stimulation, Cryotherapy, Moist heat, Ionotophoresis 486mml Dexamethasone, and Manual therapy.  PLAN FOR NEXT SESSION: LE/hip strengthening, balance training (step ups on airex, side steps on airex, walking on beam, STS on airex), maybe recheck BERG    RoScot JunPTA 07/05/2022, 2:31 PM

## 2022-07-06 NOTE — Therapy (Signed)
OUTPATIENT PHYSICAL THERAPY THORACOLUMBAR TREATMENT Progress Note Reporting Period 05/31/22 to 07/09/22  See note below for Objective Data and Assessment of Progress/Goals.      Patient Name: [name] MRN: [id number] DOB:[birth year], 81 y.o., female Today's Date: 07/05/2022   PT End of Session - 07/05/22 1430     Visit Number 9    Date for PT Re-Evaluation 08/23/22    PT Start Time 1430    PT Stop Time 1515    PT Time Calculation (min) 45 min    Activity Tolerance Patient tolerated treatment well    Behavior During Therapy WFL for tasks assessed/performed               Past Medical History:  Diagnosis Date   Anxiety    Arthritis HANDS, NECK , BACK   Asymptomatic carotid artery stenosis LEFT --  MOD. PER DR WILLIS NOTE OCT 2012   Benign heart murmur    Echo 2011 in [place]   Depression    PMH of   Diabetes mellitus ORAL MED   DVT (deep venous thrombosis) ([place]) 2006   after arthroscopy   Frequency of urination    Headache    History of CVA (cerebrovascular accident) NEUROLOGIST  DR WILLIS----  06-01-2010-  LEFT BASAL GANGLIA HEMORRAGE   RESIDUAL RIGHT HAND NUMBNESS/TINGLING   History of laparoscopic cholecystectomy 05/24/2021   Hyperlipidemia    Hypertension    Hypothyroid    MRSA (methicillin resistant staph aureus) culture positive    X3; last post TKR   Nocturia    Numbness and tingling in right hand RESIDUAL FROM CVA NOV 2011   Obesity    Redux therapy   Osteoporosis    Stroke [facility])    Thyroid disease    right thyroid nodule-- BX DONE 08-15-2011   Past Surgical History:  Procedure Laterality Date   ABDOMINAL HYSTERECTOMY  1973   Endometriosis & fibroid   CARPAL TUNNEL RELEASE  2000   RIGHT   CHOLECYSTECTOMY N/A 05/23/2021   Procedure: LAPAROSCOPIC CHOLECYSTECTOMY;  Surgeon: Coralie Keens, MD;  Location: [facility];  Service: General;  Laterality: N/A;   COLONOSCOPY  1999 & 2005   Dr Olevia Perches   cystoscope     to evaluate recurrent UTIs    CYSTOSCOPY W/ RETROGRADES  08/22/2011   Procedure: CYSTOSCOPY WITH RETROGRADE PYELOGRAM;  Surgeon: Molli Hazard, MD;  [facility];  Service: Urology;  Laterality: Bilateral;   CYSTOSCOPY WITH BIOPSY  08/22/2011   Procedure: CYSTOSCOPY WITH BIOPSY;  Surgeon: Molli Hazard, MD;  Location: [facility];  Service: Urology;  Laterality: N/A;   g2 p2     JOINT REPLACEMENT  03-28-2006   LEFT THUMB   KNEE ARTHROSCOPY     BILATERAL PRIOR TO TOTAL KNEE   LEFT SHOULDER SURG  1990'S   LUMBAR LAMINECTOMY  08-26-2009   L 4 - 5   THYROID NODULE BX  08-15-2011   RIGHT   TOTAL KNEE ARTHROPLASTY  05-26-2002      LEFT   AND RIGHT  03-30-2098   WRIST SURGERY  2014   tendon placed in joint; Dr Burney Gauze   Patient Active Problem List   Diagnosis Date Noted   Sciatica of right side 03/27/2022   Calculus of gallbladder with acute cholecystitis without obstruction 05/17/2021   Tingling 03/16/2021   Vitamin B12 deficiency 03/16/2021   Head injury due to trauma 02/23/2021   Low back pain 10/29/2019   Pain in left wrist  10/01/2019   Cervicalgia 07/29/2019   Trigger finger, left middle finger 04/28/2018   Hypothyroidism 03/29/2017   Fecal incontinence 09/25/2016   Generalized anxiety disorder 06/20/2015   SUI (stress urinary incontinence, female) 02/05/2014   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   Cerebral artery occlusion with cerebral infarction ([place]) 05/31/2010   Diabetes ([place]) 01/24/2010   THYROID NODULE, RIGHT 01/21/2009   Vitamin D deficiency 12/10/2007   HYPERLIPIDEMIA 06/17/2007   Essential hypertension 06/17/2007   Osteopenia 06/17/2007   Osteoarthrosis, unspecified whether generalized or localized, unspecified site 11/11/2006    PCP: Billey Gosling  REFERRING PROVIDER: Earnie Larsson  REFERRING DIAG: M54.16  Rationale for Evaluation and Treatment: Rehabilitation  THERAPY DIAG:  Muscle weakness (generalized)  Other low back  pain  Repeated falls  Other abnormalities of gait and mobility  ONSET DATE: "about a year ago"  SUBJECTIVE:                                                                                                                                                                                           SUBJECTIVE STATEMENT: Feeling better, still kind of weak.   PERTINENT HISTORY:  Lumbar laminectomy L4-5  PAIN:  Are you having pain? No  PRECAUTIONS: None  WEIGHT BEARING RESTRICTIONS: No  FALLS:  Has patient fallen in last 6 months? Yes. Number of falls 5-6  LIVING ENVIRONMENT: Lives with: lives with their daughter Lives in: House/apartment Stairs: No Has following equipment at home: Single point cane and Environmental consultant - 2 wheeled  OCCUPATION: Retired  PLOF: Independent  PATIENT GOALS: not to fall, get my balance better   NEXT MD VISIT:   OBJECTIVE:   SCREENING FOR RED FLAGS: Bowel or bladder incontinence: No Spinal tumors: No Cauda equina syndrome: No Compression fracture: No Abdominal aneurysm: No  COGNITION: Overall cognitive status: Within functional limits for tasks assessed     SENSATION: WFL  MUSCLE LENGTH: Hamstrings: tightness BLE  POSTURE: rounded shoulders and forward head   LUMBAR ROM:   AROM eval  Flexion Mid shin  Extension 50% w/pain  Right lateral flexion Pain at end range  Left lateral flexion Pain at end range  Right rotation Pain at end range  Left rotation Pain at end range   (Blank rows = not tested)  LOWER EXTREMITY ROM:   grossly [facility]    LOWER EXTREMITY MMT:    MMT Right eval Left eval Right 07/09/22 Left 07/09/22  Hip flexion 3+ 3+ 4+ 4+  Hip extension 3+ 3+ 4 4  Hip abduction 3+ 3+ 4 4  Hip adduction      Hip internal rotation  Hip external rotation      Knee flexion _0 Knee extension _1 Ankle dorsiflexion      Ankle plantarflexion      Ankle inversion      Ankle eversion       (Blank rows = not  tested)  LUMBAR SPECIAL TESTS:  Straight leg raise test: Positive  FUNCTIONAL TESTS:  5 times sit to stand: 24.30s Timed up and go (TUG): 16.13  GAIT: Distance walked: in clinic  Assistive device utilized: None Level of assistance: Modified independence Comments: poor foot clearance, trendelenberg, decreased step length, antalgic gait, slowed speed   TODAY'S TREATMENT:                                                                                                                              DATE:  07/09/22 Bike L3 x62mns Recheck goals-- BERG, MMT Standing on foam heel taps 4" STS on airex 2x10 Walking beam Standing tandem on foam 11s R 15s on L Standing feet together of foam, EC 30s  Feet together on foam ball toss 10 catches Tandem on foam ball toss 5 catches   07/05/22 Bike L2 x 6 min Rows and Lats 15# 2x10 Standing shoulder Extension 5lb 2x10 S2S holding yellow ball 2x10   06/28/22 Bike L2 x663ms  Rows and Lats 105# 2x10 BlackTB ext 2x10 BlackTB crunches 2x10 Supine stretching HS, IT band, piriformis Bridges with green band abd 2x10 SL clamshells greenTB 2x10 Leg ext 5# 2x10 HS curls 20# 2x10  06/25/22 Bike L2 x6m64m  Leg ext 10# 2x10 HS curls 25# 2x10 STS on airex 2x10 Walking beam side steps and narrow stance Shoulder ext 5# x10, 10# x10  Cable rows 5# 2x10  Leg press 20# 2x10  06/22/22 NuStep L5 x6mi74mLE on Pball bridges, K2C, small bridge x10 each HS, piriformis, and glute stretch 30s each Bridges 2x10 S2S with red ball push out 2x10 Resisted gait 30# 4 x 4 way Rows and Lats 20# 2x10   06/18/22 NuStep L5 x6 min Leg ext 10# 2x10 HS curls 25# 2x10 Rows & Lats 20lb 2x10 Lumbar Ext black band 2x10 S2S OHP yellow wball 2x10 5lb standing shoulder Ext 2x10  06/12/22 NuStep L5 x6min40mLeg ext 5# 2x10 HS curls 20# 2x10 Rows and lats 15# 2x10 Shoulder ext 5# 2x10 S2S on Airex 2x10- elevated mat table  Walking on beam  06/04/22 NuStep  L5 x 7 min 30lb resisted gait 4 way x 3 each Hamstring curls 20lb 2x12 Leg Ext 5lb 2x10  Bridges 2x10 LE on Pball bridges, K2C, Oblq HS, piriformis, and glite stretch  06/04/22 Bike L3 x 6 min Hamstring curls 20lb 2x10 Leg Ext 5lb 2x10  Rows green 2x10  Shoulder Ext red 2x10 S2S 2x10 Bridges x10 LE on Pball bridges, K2C, Oblq     PATIENT EDUCATION:  Education details: HEP, hip and core weakness contributing to LBP,  fall risk Person educated: Patient Education method: Explanation Education comprehension: verbalized understanding  HOME EXERCISE PROGRAM: Access Code: [email or web address] URL: [email or web address] Date: 05/31/2022 Prepared by: Andris Baumann  Exercises - Supine Bridge  - 1 x daily - 7 x weekly - 2 sets - 10 reps - Clamshell with Resistance  - 1 x daily - 7 x weekly - 2 sets - 10 reps - Standing Hip Abduction with Counter Support  - 1 x daily - 7 x weekly - 2 sets - 10 reps - Standing Hip Extension with Counter Support  - 1 x daily - 7 x weekly - 2 sets - 10 reps  ASSESSMENT:  CLINICAL IMPRESSION: Progress note complete today. Patient has made good progress towards her goals. Still can work on some strength and balance to be more steady on her feet and decrease her risk for falls. We focused on more balance tasks today, CGA needed as she sometimes loses her balance on airex.    OBJECTIVE IMPAIRMENTS: Abnormal gait, decreased balance, difficulty walking, decreased strength, and pain.    PARTICIPATION LIMITATIONS: cleaning, laundry, and yard work  [employer] POTENTIAL: Good  CLINICAL DECISION MAKING: Stable/uncomplicated  EVALUATION COMPLEXITY: Low  GOALS: Goals reviewed with patient? No  SHORT TERM GOALS: Target date: 07/12/22  Patient will be independent with initial HEP. Goal status: Met 06/18/22  2.  Patient will be educated on strategies to decrease risk of falls.  Baseline: 5-6 falls in the last 60month Goal status: MET   LONG TERM  GOALS: Target date: 08/23/22  Patient will be independent with advanced/ongoing HEP to improve outcomes and carryover.  Goal status: Ongoing   2.  Patient will demonstrate decreased fall risk by scoring < 12 sec on TUG. Baseline: 16.31s Goal status: Met 9.17 sec 06/18/22  3.  Patient will demonstrate decreased fall risk by scoring < 15 sec on 5xSTS. Baseline: 24.30s Goal status: Met 06/18/22 14.16 sec  4.  Patient will demonstrate improved functional LE strength as demonstrated by 5/5. Goal status: IN PROGRESS  5.  Patient will score 52 on Berg Balance test to demonstrate lower risk of falls. (MCID= 8 points) .  Baseline: 44, 51/56 Goal status: IN PROGRESS   PLAN:  PT FREQUENCY: 2x/week  PT DURATION: 12 weeks  PLANNED INTERVENTIONS: Therapeutic exercises, Therapeutic activity, Neuromuscular re-education, Balance training, Gait training, Patient/Family education, Self Care, Joint mobilization, Stair training, Dry Needling, Electrical stimulation, Cryotherapy, Moist heat, Ionotophoresis 429mml Dexamethasone, and Manual therapy.  PLAN FOR NEXT SESSION: LE/hip strengthening, balance training (step ups on airex, side steps on airex, walking on beam, STS on airex), maybe recheck BERG    [place]ot JunPTA 07/05/2022, 2:31 PM

## 2022-07-09 ENCOUNTER — Ambulatory Visit: Payer: Medicare Other

## 2022-07-09 DIAGNOSIS — R2689 Other abnormalities of gait and mobility: Secondary | ICD-10-CM

## 2022-07-09 DIAGNOSIS — R296 Repeated falls: Secondary | ICD-10-CM

## 2022-07-09 DIAGNOSIS — M6281 Muscle weakness (generalized): Secondary | ICD-10-CM

## 2022-07-09 DIAGNOSIS — M5459 Other low back pain: Secondary | ICD-10-CM

## 2022-07-11 ENCOUNTER — Encounter: Payer: Self-pay | Admitting: Physical Therapy

## 2022-07-11 ENCOUNTER — Ambulatory Visit: Payer: Medicare Other | Admitting: Physical Therapy

## 2022-07-11 DIAGNOSIS — R296 Repeated falls: Secondary | ICD-10-CM

## 2022-07-11 DIAGNOSIS — R2689 Other abnormalities of gait and mobility: Secondary | ICD-10-CM | POA: Diagnosis not present

## 2022-07-11 DIAGNOSIS — M5459 Other low back pain: Secondary | ICD-10-CM | POA: Diagnosis not present

## 2022-07-11 DIAGNOSIS — M6281 Muscle weakness (generalized): Secondary | ICD-10-CM

## 2022-07-11 NOTE — Therapy (Signed)
OUTPATIENT PHYSICAL THERAPY THORACOLUMBAR TREATMENT     Patient Name: [name] MRN: [id number] DOB:1940/09/27, 81 y.o., female Today's Date: 07/11/2022   PT End of Session - 07/11/22 1018     Visit Number 11    Date for PT Re-Evaluation 08/23/22    PT Start Time 1018    PT Stop Time 1100    PT Time Calculation (min) 42 min    Activity Tolerance Patient tolerated treatment well    Behavior During Therapy WFL for tasks assessed/performed               Past Medical History:  Diagnosis Date   Anxiety    Arthritis HANDS, NECK , BACK   Asymptomatic carotid artery stenosis LEFT --  MOD. PER DR WILLIS NOTE OCT 2012   Benign heart murmur    Echo 2011 in [place]   Depression    PMH of   Diabetes mellitus ORAL MED   DVT (deep venous thrombosis) ([place]) 2006   after arthroscopy   Frequency of urination    Headache    History of CVA (cerebrovascular accident) NEUROLOGIST  DR WILLIS----  06-01-2010-  LEFT BASAL GANGLIA HEMORRAGE   RESIDUAL RIGHT HAND NUMBNESS/TINGLING   History of laparoscopic cholecystectomy 05/24/2021   Hyperlipidemia    Hypertension    Hypothyroid    MRSA (methicillin resistant staph aureus) culture positive    X3; last post TKR   Nocturia    Numbness and tingling in right hand RESIDUAL FROM CVA NOV 2011   Obesity    Redux therapy   Osteoporosis    Stroke [facility])    Thyroid disease    right thyroid nodule-- BX DONE 08-15-2011   Past Surgical History:  Procedure Laterality Date   ABDOMINAL HYSTERECTOMY  1973   Endometriosis & fibroid   CARPAL TUNNEL RELEASE  2000   RIGHT   CHOLECYSTECTOMY N/A 05/23/2021   Procedure: LAPAROSCOPIC CHOLECYSTECTOMY;  Surgeon: Coralie Keens, MD;  Location: [facility];  Service: General;  Laterality: N/A;   COLONOSCOPY  1999 & 2005   Dr Olevia Perches   cystoscope     to evaluate recurrent UTIs   CYSTOSCOPY W/ RETROGRADES  08/22/2011   Procedure: CYSTOSCOPY WITH RETROGRADE PYELOGRAM;  Surgeon: Molli Hazard, MD;   Location: [facility];  Service: Urology;  Laterality: Bilateral;   CYSTOSCOPY WITH BIOPSY  08/22/2011   Procedure: CYSTOSCOPY WITH BIOPSY;  Surgeon: Molli Hazard, MD;  Location: [facility];  Service: Urology;  Laterality: N/A;   g2 p2     JOINT REPLACEMENT  03-28-2006   LEFT THUMB   KNEE ARTHROSCOPY     BILATERAL PRIOR TO TOTAL KNEE   LEFT SHOULDER SURG  1990'S   LUMBAR LAMINECTOMY  08-26-2009   L 4 - 5   THYROID NODULE BX  08-15-2011   RIGHT   TOTAL KNEE ARTHROPLASTY  05-26-2002      LEFT   AND RIGHT  03-30-2098   WRIST SURGERY  2014   tendon placed in joint; Dr Burney Gauze   Patient Active Problem List   Diagnosis Date Noted   Sciatica of right side 03/27/2022   Calculus of gallbladder with acute cholecystitis without obstruction 05/17/2021   Tingling 03/16/2021   Vitamin B12 deficiency 03/16/2021   Head injury due to trauma 02/23/2021   Low back pain 10/29/2019   Pain in left wrist 10/01/2019   Cervicalgia 07/29/2019   Trigger finger, left middle finger 04/28/2018   Hypothyroidism 03/29/2017  Fecal incontinence 09/25/2016   Generalized anxiety disorder 06/20/2015   SUI (stress urinary incontinence, female) 02/05/2014   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   Cerebral artery occlusion with cerebral infarction ([place]) 05/31/2010   Diabetes ([place]) 01/24/2010   THYROID NODULE, RIGHT 01/21/2009   Vitamin D deficiency 12/10/2007   HYPERLIPIDEMIA 06/17/2007   Essential hypertension 06/17/2007   Osteopenia 06/17/2007   Osteoarthrosis, unspecified whether generalized or localized, unspecified site 11/11/2006    PCP: Billey Gosling  REFERRING PROVIDER: Earnie Larsson  REFERRING DIAG: M54.16  Rationale for Evaluation and Treatment: Rehabilitation  THERAPY DIAG:  Muscle weakness (generalized)  Other abnormalities of gait and mobility  Other low back pain  Repeated falls  ONSET DATE: "about a year ago"  SUBJECTIVE:                                                                                                                                                                                            SUBJECTIVE STATEMENT: "I feel pretty good"  PERTINENT HISTORY:  Lumbar laminectomy L4-5  PAIN:  Are you having pain? No  PRECAUTIONS: None  WEIGHT BEARING RESTRICTIONS: No  FALLS:  Has patient fallen in last 6 months? Yes. Number of falls 5-6  LIVING ENVIRONMENT: Lives with: lives with their daughter Lives in: House/apartment Stairs: No Has following equipment at home: Single point cane and Environmental consultant - 2 wheeled  OCCUPATION: Retired  PLOF: Independent  PATIENT GOALS: not to fall, get my balance better   NEXT MD VISIT:   OBJECTIVE:   SCREENING FOR RED FLAGS: Bowel or bladder incontinence: No Spinal tumors: No Cauda equina syndrome: No Compression fracture: No Abdominal aneurysm: No  COGNITION: Overall cognitive status: Within functional limits for tasks assessed     SENSATION: WFL  MUSCLE LENGTH: Hamstrings: tightness BLE  POSTURE: rounded shoulders and forward head   LUMBAR ROM:   AROM eval  Flexion Mid shin  Extension 50% w/pain  Right lateral flexion Pain at end range  Left lateral flexion Pain at end range  Right rotation Pain at end range  Left rotation Pain at end range   (Blank rows = not tested)  LOWER EXTREMITY ROM:   grossly [facility]    LOWER EXTREMITY MMT:    MMT Right eval Left eval Right 07/09/22 Left 07/09/22  Hip flexion 3+ 3+ 4+ 4+  Hip extension 3+ 3+ 4 4  Hip abduction 3+ 3+ 4 4  Hip adduction      Hip internal rotation      Hip external rotation      Knee flexion _0 Knee extension _1 5  Ankle dorsiflexion      Ankle plantarflexion      Ankle inversion      Ankle eversion       (Blank rows = not tested)  LUMBAR SPECIAL TESTS:  Straight leg raise test: Positive  FUNCTIONAL TESTS:  5 times sit to stand: 24.30s Timed up and go (TUG):  16.13  GAIT: Distance walked: in clinic  Assistive device utilized: None Level of assistance: Modified independence Comments: poor foot clearance, trendelenberg, decreased step length, antalgic gait, slowed speed   TODAY'S TREATMENT:                                                                                                                              DATE:  07/11/22 NuStep L5 x 6 min S2S on airex OHP yellow wball 2x10 Resisted gait 30lb all directions x3 each Hamstring curls 25lb 2x10  Leg Ext 10lb 2x10  6in step ups x5 each  07/09/22 Bike L3 x30mns Recheck goals-- BERG, MMT Standing on foam heel taps 4" STS on airex 2x10 Walking beam Standing tandem on foam 11s R 15s on L Standing feet together of foam, EC 30s  Feet together on foam ball toss 10 catches Tandem on foam ball toss 5 catches   07/05/22 Bike L2 x 6 min Rows and Lats 15# 2x10 Standing shoulder Extension 5lb 2x10 S2S holding yellow ball 2x10   06/28/22 Bike L2 x611ms  Rows and Lats 105# 2x10 BlackTB ext 2x10 BlackTB crunches 2x10 Supine stretching HS, IT band, piriformis Bridges with green band abd 2x10 SL clamshells greenTB 2x10 Leg ext 5# 2x10 HS curls 20# 2x10  06/25/22 Bike L2 x6m66m  Leg ext 10# 2x10 HS curls 25# 2x10 STS on airex 2x10 Walking beam side steps and narrow stance Shoulder ext 5# x10, 10# x10  Cable rows 5# 2x10  Leg press 20# 2x10  06/22/22 NuStep L5 x6mi60mLE on Pball bridges, K2C, small bridge x10 each HS, piriformis, and glute stretch 30s each Bridges 2x10 S2S with red ball push out 2x10 Resisted gait 30# 4 x 4 way Rows and Lats 20# 2x10   06/18/22 NuStep L5 x6 min Leg ext 10# 2x10 HS curls 25# 2x10 Rows & Lats 20lb 2x10 Lumbar Ext black band 2x10 S2S OHP yellow wball 2x10 5lb standing shoulder Ext 2x10  06/12/22 NuStep L5 x6min13mLeg ext 5# 2x10 HS curls 20# 2x10 Rows and lats 15# 2x10 Shoulder ext 5# 2x10 S2S on Airex 2x10- elevated mat table   Walking on beam    PATIENT EDUCATION:  Education details: HEP, hip and core weakness contributing to LBP, fall risk Person educated: Patient Education method: Explanation Education comprehension: verbalized understanding  HOME EXERCISE PROGRAM: Access Code: [email or web address] URL: [email or web address] Date: 05/31/2022 Prepared by: Mona Andris Baumannrcises - Supine Bridge  - 1 x daily - 7 x weekly - 2 sets - 10 reps - Clamshell with Resistance  - 1 x daily - 7  x weekly - 2 sets - 10 reps - Standing Hip Abduction with Counter Support  - 1 x daily - 7 x weekly - 2 sets - 10 reps - Standing Hip Extension with Counter Support  - 1 x daily - 7 x weekly - 2 sets - 10 reps  ASSESSMENT:  CLINICAL IMPRESSION: Patient enters doing well. Continues to work on some strength and balance to be more steady on her feet and decrease her risk for falls.  CGA needed at times with step ups. Fatigue present with sit to stands.   OBJECTIVE IMPAIRMENTS: Abnormal gait, decreased balance, difficulty walking, decreased strength, and pain.    PARTICIPATION LIMITATIONS: cleaning, laundry, and yard work  REHAB POTENTIAL: Good  CLINICAL DECISION MAKING: Stable/uncomplicated  EVALUATION COMPLEXITY: Low  GOALS: Goals reviewed with patient? No  SHORT TERM GOALS: Target date: 07/12/22  Patient will be independent with initial HEP. Goal status: Met 06/18/22  2.  Patient will be educated on strategies to decrease risk of falls.  Baseline: 5-6 falls in the last 6months Goal status: MET   LONG TERM GOALS: Target date: 08/23/22  Patient will be independent with advanced/ongoing HEP to improve outcomes and carryover.  Goal status: Ongoing   2.  Patient will demonstrate decreased fall risk by scoring < 12 sec on TUG. Baseline: 16.31s Goal status: Met 9.17 sec 06/18/22  3.  Patient will demonstrate decreased fall risk by scoring < 15 sec on 5xSTS. Baseline: 24.30s Goal status: Met 06/18/22  14.16 sec  4.  Patient will demonstrate improved functional LE strength as demonstrated by 5/5. Goal status: IN PROGRESS  5.  Patient will score 52 on Berg Balance test to demonstrate lower risk of falls. (MCID= 8 points) .  Baseline: 44, 51/56 Goal status: IN PROGRESS   PLAN:  PT FREQUENCY: 2x/week  PT DURATION: 12 weeks  PLANNED INTERVENTIONS: Therapeutic exercises, Therapeutic activity, Neuromuscular re-education, Balance training, Gait training, Patient/Family education, Self Care, Joint mobilization, Stair training, Dry Needling, Electrical stimulation, Cryotherapy, Moist heat, Ionotophoresis 4mg/ml Dexamethasone, and Manual therapy.  PLAN FOR NEXT SESSION: LE/hip strengthening, balance training (step ups on airex, side steps on airex, walking on beam, STS on airex), maybe recheck BERG     G , PTA 07/11/2022, 10:21 AM  

## 2022-07-17 ENCOUNTER — Ambulatory Visit: Payer: Medicare Other

## 2022-07-17 DIAGNOSIS — M6281 Muscle weakness (generalized): Secondary | ICD-10-CM | POA: Diagnosis not present

## 2022-07-17 DIAGNOSIS — R296 Repeated falls: Secondary | ICD-10-CM

## 2022-07-17 DIAGNOSIS — R2689 Other abnormalities of gait and mobility: Secondary | ICD-10-CM

## 2022-07-17 DIAGNOSIS — M5459 Other low back pain: Secondary | ICD-10-CM | POA: Diagnosis not present

## 2022-07-17 NOTE — Therapy (Signed)
OUTPATIENT PHYSICAL THERAPY THORACOLUMBAR TREATMENT     Patient Name: [name] MRN: [id number] DOB:04/14/1941, 81 y.o., female Today's Date: 07/17/2022   PT End of Session - 07/17/22 1154     Visit Number 12    Date for PT Re-Evaluation 08/23/22    PT Start Time 1154    PT Stop Time 1230    PT Time Calculation (min) 36 min    Activity Tolerance Patient tolerated treatment well    Behavior During Therapy WFL for tasks assessed/performed               Past Medical History:  Diagnosis Date   Anxiety    Arthritis HANDS, NECK , BACK   Asymptomatic carotid artery stenosis LEFT --  MOD. PER DR WILLIS NOTE OCT 2012   Benign heart murmur    Echo 2011 in [place]   Depression    PMH of   Diabetes mellitus ORAL MED   DVT (deep venous thrombosis) ([place]) 2006   after arthroscopy   Frequency of urination    Headache    History of CVA (cerebrovascular accident) NEUROLOGIST  DR WILLIS----  06-01-2010-  LEFT BASAL GANGLIA HEMORRAGE   RESIDUAL RIGHT HAND NUMBNESS/TINGLING   History of laparoscopic cholecystectomy 05/24/2021   Hyperlipidemia    Hypertension    Hypothyroid    MRSA (methicillin resistant staph aureus) culture positive    X3; last post TKR   Nocturia    Numbness and tingling in right hand RESIDUAL FROM CVA NOV 2011   Obesity    Redux therapy   Osteoporosis    Stroke [facility])    Thyroid disease    right thyroid nodule-- BX DONE 08-15-2011   Past Surgical History:  Procedure Laterality Date   ABDOMINAL HYSTERECTOMY  1973   Endometriosis & fibroid   CARPAL TUNNEL RELEASE  2000   RIGHT   CHOLECYSTECTOMY N/A 05/23/2021   Procedure: LAPAROSCOPIC CHOLECYSTECTOMY;  Surgeon: Coralie Keens, MD;  [facility];  Service: General;  Laterality: N/A;   COLONOSCOPY  1999 & 2005   Dr Olevia Perches   cystoscope     to evaluate recurrent UTIs   CYSTOSCOPY W/ RETROGRADES  08/22/2011   Procedure: CYSTOSCOPY WITH RETROGRADE PYELOGRAM;  Surgeon: Molli Hazard, MD;   Location[facility];  Service: Urology;  Laterality: Bilateral;   CYSTOSCOPY WITH BIOPSY  08/22/2011   Procedure: CYSTOSCOPY WITH BIOPSY;  Surgeon: Molli Hazard, MD;  Location: [facility];  Service: Urology;  Laterality: N/A;   g2 p2     JOINT REPLACEMENT  03-28-2006   LEFT THUMB   KNEE ARTHROSCOPY     BILATERAL PRIOR TO TOTAL KNEE   LEFT SHOULDER SURG  1990'S   LUMBAR LAMINECTOMY  08-26-2009   L 4 - 5   THYROID NODULE BX  08-15-2011   RIGHT   TOTAL KNEE ARTHROPLASTY  05-26-2002      LEFT   AND RIGHT  03-30-2098   WRIST SURGERY  2014   tendon placed in joint; Dr Burney Gauze   Patient Active Problem List   Diagnosis Date Noted   Sciatica of right side 03/27/2022   Calculus of gallbladder with acute cholecystitis without obstruction 05/17/2021   Tingling 03/16/2021   Vitamin B12 deficiency 03/16/2021   Head injury due to trauma 02/23/2021   Low back pain 10/29/2019   Pain in left wrist 10/01/2019   Cervicalgia 07/29/2019   Trigger finger, left middle finger 04/28/2018   Hypothyroidism 03/29/2017  Fecal incontinence 09/25/2016   Generalized anxiety disorder 06/20/2015   SUI (stress urinary incontinence, female) 02/05/2014   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   Cerebral artery occlusion with cerebral infarction ([place]) 05/31/2010   Diabetes ([place]) 01/24/2010   THYROID NODULE, RIGHT 01/21/2009   Vitamin D deficiency 12/10/2007   HYPERLIPIDEMIA 06/17/2007   Essential hypertension 06/17/2007   Osteopenia 06/17/2007   Osteoarthrosis, unspecified whether generalized or localized, unspecified site 11/11/2006    PCP: Billey Gosling  REFERRING PROVIDER: Earnie Larsson  REFERRING DIAG: M54.16  Rationale for Evaluation and Treatment: Rehabilitation  THERAPY DIAG:  Muscle weakness (generalized)  Other abnormalities of gait and mobility  Other low back pain  Repeated falls  ONSET DATE: "about a year ago"  SUBJECTIVE:                                                                                                                                                                                            SUBJECTIVE STATEMENT: Back didn't hurt but I am just tired from [place]:  Lumbar laminectomy L4-5  PAIN:  Are you having pain? No  PRECAUTIONS: None  WEIGHT BEARING RESTRICTIONS: No  FALLS:  Has patient fallen in last 6 months? Yes. Number of falls 5-6  LIVING ENVIRONMENT: Lives with: lives with their daughter Lives in: House/apartment Stairs: No Has following equipment at home: Single point cane and Environmental consultant - 2 wheeled  OCCUPATION: Retired  PLOF: Independent  PATIENT GOALS: not to fall, get my balance better   NEXT MD VISIT:   OBJECTIVE:   SCREENING FOR RED FLAGS: Bowel or bladder incontinence: No Spinal tumors: No Cauda equina syndrome: No Compression fracture: No Abdominal aneurysm: No  COGNITION: Overall cognitive status: Within functional limits for tasks assessed     SENSATION: WFL  MUSCLE LENGTH: Hamstrings: tightness BLE  POSTURE: rounded shoulders and forward head   LUMBAR ROM:   AROM eval  Flexion Mid shin  Extension 50% w/pain  Right lateral flexion Pain at end range  Left lateral flexion Pain at end range  Right rotation Pain at end range  Left rotation Pain at end range   (Blank rows = not tested)  LOWER EXTREMITY ROM:   grossly [facility]    LOWER EXTREMITY MMT:    MMT Right eval Left eval Right 07/09/22 Left 07/09/22  Hip flexion 3+ 3+ 4+ 4+  Hip extension 3+ 3+ 4 4  Hip abduction 3+ 3+ 4 4  Hip adduction      Hip internal rotation      Hip external rotation      Knee flexion _0 5  Knee extension _0 Ankle dorsiflexion      Ankle plantarflexion      Ankle inversion      Ankle eversion       (Blank rows = not tested)  LUMBAR SPECIAL TESTS:  Straight leg raise test: Positive  FUNCTIONAL TESTS:  5 times sit to stand: 24.30s Timed  up and go (TUG): 16.13  GAIT: Distance walked: in clinic  Assistive device utilized: None Level of assistance: Modified independence Comments: poor foot clearance, trendelenberg, decreased step length, antalgic gait, slowed speed   TODAY'S TREATMENT:                                                                                                                              DATE:  07/17/22 NuStep L5 x25mns HS curls 25# 2x10 Leg ext 10# 2x10 Standing on airex rows and ext green 2x10 Rows and lats 20# 2x10 Leg press 20# 2x10  07/11/22 NuStep L5 x 6 min S2S on airex OHP yellow wball 2x10 Resisted gait 30lb all directions x3 each Hamstring curls 25lb 2x10  Leg Ext 10lb 2x10  6in step ups x5 each  07/09/22 Bike L3 x668ms Recheck goals-- BERG, MMT Standing on foam heel taps 4" STS on airex 2x10 Walking beam Standing tandem on foam 11s R 15s on L Standing feet together of foam, EC 30s  Feet together on foam ball toss 10 catches Tandem on foam ball toss 5 catches   07/05/22 Bike L2 x 6 min Rows and Lats 15# 2x10 Standing shoulder Extension 5lb 2x10 S2S holding yellow ball 2x10   06/28/22 Bike L2 x6m86m  Rows and Lats 105# 2x10 BlackTB ext 2x10 BlackTB crunches 2x10 Supine stretching HS, IT band, piriformis Bridges with green band abd 2x10 SL clamshells greenTB 2x10 Leg ext 5# 2x10 HS curls 20# 2x10  06/25/22 Bike L2 x6mi16m Leg ext 10# 2x10 HS curls 25# 2x10 STS on airex 2x10 Walking beam side steps and narrow stance Shoulder ext 5# x10, 10# x10  Cable rows 5# 2x10  Leg press 20# 2x10  06/22/22 NuStep L5 x6min10mE on Pball bridges, K2C, small bridge x10 each HS, piriformis, and glute stretch 30s each Bridges 2x10 S2S with red ball push out 2x10 Resisted gait 30# 4 x 4 way Rows and Lats 20# 2x10   06/18/22 NuStep L5 x6 min Leg ext 10# 2x10 HS curls 25# 2x10 Rows & Lats 20lb 2x10 Lumbar Ext black band 2x10 S2S OHP yellow wball 2x10 5lb standing  shoulder Ext 2x10  06/12/22 NuStep L5 x6mins65meg ext 5# 2x10 HS curls 20# 2x10 Rows and lats 15# 2x10 Shoulder ext 5# 2x10 S2S on Airex 2x10- elevated mat table  Walking on beam    PATIENT EDUCATION:  Education details: HEP, hip and core weakness contributing to LBP, fall risk Person educated: Patient Education method: Explanation Education comprehension: verbalized understanding  HOME EXERCISE PROGRAM: Access Code: [email or web address] URL: [email or web address]  Date: 05/31/2022 Prepared by: Andris Baumann  Exercises - Supine Bridge  - 1 x daily - 7 x weekly - 2 sets - 10 reps - Clamshell with Resistance  - 1 x daily - 7 x weekly - 2 sets - 10 reps - Standing Hip Abduction with Counter Support  - 1 x daily - 7 x weekly - 2 sets - 10 reps - Standing Hip Extension with Counter Support  - 1 x daily - 7 x weekly - 2 sets - 10 reps  ASSESSMENT:  CLINICAL IMPRESSION: Patient arrives ~50mns late to appt because of a train. Continues to work on some strength and balance to be more steady on her feet and decrease her risk for falls.    OBJECTIVE IMPAIRMENTS: Abnormal gait, decreased balance, difficulty walking, decreased strength, and pain.    PARTICIPATION LIMITATIONS: cleaning, laundry, and yard work  RBrink's CompanyPOTENTIAL: Good  CLINICAL DECISION MAKING: Stable/uncomplicated  EVALUATION COMPLEXITY: Low  GOALS: Goals reviewed with patient? No  SHORT TERM GOALS: Target date: 07/12/22  Patient will be independent with initial HEP. Goal status: Met 06/18/22  2.  Patient will be educated on strategies to decrease risk of falls.  Baseline: 5-6 falls in the last 610monthGoal status: MET   LONG TERM GOALS: Target date: 08/23/22  Patient will be independent with advanced/ongoing HEP to improve outcomes and carryover.  Goal status: Ongoing   2.  Patient will demonstrate decreased fall risk by scoring < 12 sec on TUG. Baseline: 16.31s Goal status: Met 9.17 sec  06/18/22  3.  Patient will demonstrate decreased fall risk by scoring < 15 sec on 5xSTS. Baseline: 24.30s Goal status: Met 06/18/22 14.16 sec  4.  Patient will demonstrate improved functional LE strength as demonstrated by 5/5. Goal status: IN PROGRESS  5.  Patient will score 52 on Berg Balance test to demonstrate lower risk of falls. (MCID= 8 points) .  Baseline: 44, 51/56 Goal status: IN PROGRESS   PLAN:  PT FREQUENCY: 2x/week  PT DURATION: 12 weeks  PLANNED INTERVENTIONS: Therapeutic exercises, Therapeutic activity, Neuromuscular re-education, Balance training, Gait training, Patient/Family education, Self Care, Joint mobilization, Stair training, Dry Needling, Electrical stimulation, Cryotherapy, Moist heat, Ionotophoresis 102m79ml Dexamethasone, and Manual therapy.  PLAN FOR NEXT SESSION: LE/hip strengthening, balance training (step ups on airex, side steps on airex, walking on beam, STS on airex), maybe recheck BERIllinois Tool WorksT 07/17/2022, 12:30 PM

## 2022-07-19 ENCOUNTER — Encounter: Payer: Self-pay | Admitting: Physical Therapy

## 2022-07-19 ENCOUNTER — Ambulatory Visit: Payer: Medicare Other | Admitting: Physical Therapy

## 2022-07-19 DIAGNOSIS — R2689 Other abnormalities of gait and mobility: Secondary | ICD-10-CM | POA: Diagnosis not present

## 2022-07-19 DIAGNOSIS — M6281 Muscle weakness (generalized): Secondary | ICD-10-CM | POA: Diagnosis not present

## 2022-07-19 DIAGNOSIS — M5459 Other low back pain: Secondary | ICD-10-CM | POA: Diagnosis not present

## 2022-07-19 DIAGNOSIS — R296 Repeated falls: Secondary | ICD-10-CM

## 2022-07-19 NOTE — Therapy (Signed)
OUTPATIENT PHYSICAL THERAPY THORACOLUMBAR TREATMENT     Patient Name: [name] MRN: [id number] DOB:[birth year], 81 y.o., female Today's Date: 07/19/2022   PT End of Session - 07/19/22 1305     Visit Number 13    Date for PT Re-Evaluation 08/23/22    PT Start Time 1301    PT Stop Time 1345    PT Time Calculation (min) 44 min    Activity Tolerance Patient tolerated treatment well    Behavior During Therapy WFL for tasks assessed/performed               Past Medical History:  Diagnosis Date   Anxiety    Arthritis HANDS, NECK , BACK   Asymptomatic carotid artery stenosis LEFT --  MOD. PER DR WILLIS NOTE OCT 2012   Benign heart murmur    Echo 2011 in [place]   Depression    PMH of   Diabetes mellitus ORAL MED   DVT (deep venous thrombosis) ([place]) 2006   after arthroscopy   Frequency of urination    Headache    History of CVA (cerebrovascular accident) NEUROLOGIST  DR WILLIS----  06-01-2010-  LEFT BASAL GANGLIA HEMORRAGE   RESIDUAL RIGHT HAND NUMBNESS/TINGLING   History of laparoscopic cholecystectomy 05/24/2021   Hyperlipidemia    Hypertension    Hypothyroid    MRSA (methicillin resistant staph aureus) culture positive    X3; last post TKR   Nocturia    Numbness and tingling in right hand RESIDUAL FROM CVA NOV 2011   Obesity    Redux therapy   Osteoporosis    Stroke [facility])    Thyroid disease    right thyroid nodule-- BX DONE 08-15-2011   Past Surgical History:  Procedure Laterality Date   ABDOMINAL HYSTERECTOMY  1973   Endometriosis & fibroid   CARPAL TUNNEL RELEASE  2000   RIGHT   CHOLECYSTECTOMY N/A 05/23/2021   Procedure: LAPAROSCOPIC CHOLECYSTECTOMY;  Surgeon: Coralie Keens, MD;  Location: [facility];  Service: General;  Laterality: N/A;   COLONOSCOPY  1999 & 2005   Dr Olevia Perches   cystoscope     to evaluate recurrent UTIs   CYSTOSCOPY W/ RETROGRADES  08/22/2011   Procedure: CYSTOSCOPY WITH RETROGRADE PYELOGRAM;  Surgeon: Molli Hazard, MD;   Location: [facility];  Service: Urology;  Laterality: Bilateral;   CYSTOSCOPY WITH BIOPSY  08/22/2011   Procedure: CYSTOSCOPY WITH BIOPSY;  Surgeon: Molli Hazard, MD;  Location: [facility];  Service: Urology;  Laterality: N/A;   g2 p2     JOINT REPLACEMENT  03-28-2006   LEFT THUMB   KNEE ARTHROSCOPY     BILATERAL PRIOR TO TOTAL KNEE   LEFT SHOULDER SURG  1990'S   LUMBAR LAMINECTOMY  08-26-2009   L 4 - 5   THYROID NODULE BX  08-15-2011   RIGHT   TOTAL KNEE ARTHROPLASTY  05-26-2002      LEFT   AND RIGHT  03-30-2098   WRIST SURGERY  2014   tendon placed in joint; Dr Burney Gauze   Patient Active Problem List   Diagnosis Date Noted   Sciatica of right side 03/27/2022   Calculus of gallbladder with acute cholecystitis without obstruction 05/17/2021   Tingling 03/16/2021   Vitamin B12 deficiency 03/16/2021   Head injury due to trauma 02/23/2021   Low back pain 10/29/2019   Pain in left wrist 10/01/2019   Cervicalgia 07/29/2019   Trigger finger, left middle finger 04/28/2018   Hypothyroidism 03/29/2017  Fecal incontinence 09/25/2016   Generalized anxiety disorder 06/20/2015   SUI (stress urinary incontinence, female) 02/05/2014   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   Cerebral artery occlusion with cerebral infarction ([place]) 05/31/2010   Diabetes ([place]) 01/24/2010   THYROID NODULE, RIGHT 01/21/2009   Vitamin D deficiency 12/10/2007   HYPERLIPIDEMIA 06/17/2007   Essential hypertension 06/17/2007   Osteopenia 06/17/2007   Osteoarthrosis, unspecified whether generalized or localized, unspecified site 11/11/2006    PCP: Billey Gosling  REFERRING PROVIDER: Earnie Larsson  REFERRING DIAG: M54.16  Rationale for Evaluation and Treatment: Rehabilitation  THERAPY DIAG:  Muscle weakness (generalized)  Other abnormalities of gait and mobility  Other low back pain  Repeated falls  ONSET DATE: "about a year ago"  SUBJECTIVE:                                                                                                                                                                                            SUBJECTIVE STATEMENT: Some back soreness after last session. Back is a little sore now  PERTINENT HISTORY:  Lumbar laminectomy L4-5  PAIN:  Are you having pain? No  PRECAUTIONS: None  WEIGHT BEARING RESTRICTIONS: No  FALLS:  Has patient fallen in last 6 months? Yes. Number of falls 5-6  LIVING ENVIRONMENT: Lives with: lives with their daughter Lives in: House/apartment Stairs: No Has following equipment at home: Single point cane and Environmental consultant - 2 wheeled  OCCUPATION: Retired  PLOF: Independent  PATIENT GOALS: not to fall, get my balance better   NEXT MD VISIT:   OBJECTIVE:   SCREENING FOR RED FLAGS: Bowel or bladder incontinence: No Spinal tumors: No Cauda equina syndrome: No Compression fracture: No Abdominal aneurysm: No  COGNITION: Overall cognitive status: Within functional limits for tasks assessed     SENSATION: WFL  MUSCLE LENGTH: Hamstrings: tightness BLE  POSTURE: rounded shoulders and forward head   LUMBAR ROM:   AROM eval  Flexion Mid shin  Extension 50% w/pain  Right lateral flexion Pain at end range  Left lateral flexion Pain at end range  Right rotation Pain at end range  Left rotation Pain at end range   (Blank rows = not tested)  LOWER EXTREMITY ROM:   grossly [facility]    LOWER EXTREMITY MMT:    MMT Right eval Left eval Right 07/09/22 Left 07/09/22  Hip flexion 3+ 3+ 4+ 4+  Hip extension 3+ 3+ 4 4  Hip abduction 3+ 3+ 4 4  Hip adduction      Hip internal rotation      Hip external rotation      Knee flexion 4 4 5  5  Knee extension _0 Ankle dorsiflexion      Ankle plantarflexion      Ankle inversion      Ankle eversion       (Blank rows = not tested)  LUMBAR SPECIAL TESTS:  Straight leg raise test: Positive  FUNCTIONAL TESTS:  5 times sit to stand:  24.30s Timed up and go (TUG): 16.13  GAIT: Distance walked: in clinic  Assistive device utilized: None Level of assistance: Modified independence Comments: poor foot clearance, trendelenberg, decreased step length, antalgic gait, slowed speed   TODAY'S TREATMENT:                                                                                                                              DATE:  07/19/22 NuStep L 5 x 6 min Rows and lats 20# 2x10 Shoulder Ext 10lb x10, 5lb x10 LE & Lumbar stretches w/ MHP to lumbar spine ~13 min   NuStep L5 x8mns HS curls 25# 2x10 Leg ext 10# 2x10 Standing on airex rows and ext green 2x10 Rows and lats 20# 2x10 Leg press 20# 2x10  07/11/22 NuStep L5 x 6 min S2S on airex OHP yellow wball 2x10 Resisted gait 30lb all directions x3 each Hamstring curls 25lb 2x10  Leg Ext 10lb 2x10  6in step ups x5 each  07/09/22 Bike L3 x625ms Recheck goals-- BERG, MMT Standing on foam heel taps 4" STS on airex 2x10 Walking beam Standing tandem on foam 11s R 15s on L Standing feet together of foam, EC 30s  Feet together on foam ball toss 10 catches Tandem on foam ball toss 5 catches   07/05/22 Bike L2 x 6 min Rows and Lats 15# 2x10 Standing shoulder Extension 5lb 2x10 S2S holding yellow ball 2x10   06/28/22 Bike L2 x6m25m  Rows and Lats 105# 2x10 BlackTB ext 2x10 BlackTB crunches 2x10 Supine stretching HS, IT band, piriformis Bridges with green band abd 2x10 SL clamshells greenTB 2x10 Leg ext 5# 2x10 HS curls 20# 2x10  06/25/22 Bike L2 x6mi63m Leg ext 10# 2x10 HS curls 25# 2x10 STS on airex 2x10 Walking beam side steps and narrow stance Shoulder ext 5# x10, 10# x10  Cable rows 5# 2x10  Leg press 20# 2x10  06/22/22 NuStep L5 x6min40mE on Pball bridges, K2C, small bridge x10 each HS, piriformis, and glute stretch 30s each Bridges 2x10 S2S with red ball push out 2x10 Resisted gait 30# 4 x 4 way Rows and Lats 20#  2x10   06/18/22 NuStep L5 x6 min Leg ext 10# 2x10 HS curls 25# 2x10 Rows & Lats 20lb 2x10 Lumbar Ext black band 2x10 S2S OHP yellow wball 2x10 5lb standing shoulder Ext 2x10  06/12/22 NuStep L5 x6mins72meg ext 5# 2x10 HS curls 20# 2x10 Rows and lats 15# 2x10 Shoulder ext 5# 2x10 S2S on Airex 2x10- elevated mat table  Walking on beam    PATIENT EDUCATION:  Education details: HEP, hip and core weakness contributing to LBP, fall risk Person educated: Patient Education method: Explanation Education comprehension: verbalized understanding  HOME EXERCISE PROGRAM: Access Code: [email or web address] URL: [email or web address] Date: 05/31/2022 Prepared by: Andris Baumann  Exercises - Supine Bridge  - 1 x daily - 7 x weekly - 2 sets - 10 reps - Clamshell with Resistance  - 1 x daily - 7 x weekly - 2 sets - 10 reps - Standing Hip Abduction with Counter Support  - 1 x daily - 7 x weekly - 2 sets - 10 reps - Standing Hip Extension with Counter Support  - 1 x daily - 7 x weekly - 2 sets - 10 reps  ASSESSMENT:  CLINICAL IMPRESSION: Patient with reports of low back soreness from last session. Session bas backed down today. Postural cues needed with standing shoulder Ext. Added LE and lumbar stretching with MHP to hep with subjective reports.   OBJECTIVE IMPAIRMENTS: Abnormal gait, decreased balance, difficulty walking, decreased strength, and pain.    PARTICIPATION LIMITATIONS: cleaning, laundry, and yard work  [employer] POTENTIAL: Good  CLINICAL DECISION MAKING: Stable/uncomplicated  EVALUATION COMPLEXITY: Low  GOALS: Goals reviewed with patient? No  SHORT TERM GOALS: Target date: 07/12/22  Patient will be independent with initial HEP. Goal status: Met 06/18/22  2.  Patient will be educated on strategies to decrease risk of falls.  Baseline: 5-6 falls in the last 72month Goal status: MET   LONG TERM GOALS: Target date: 08/23/22  Patient will be independent with  advanced/ongoing HEP to improve outcomes and carryover.  Goal status: Ongoing   2.  Patient will demonstrate decreased fall risk by scoring < 12 sec on TUG. Baseline: 16.31s Goal status: Met 9.17 sec 06/18/22  3.  Patient will demonstrate decreased fall risk by scoring < 15 sec on 5xSTS. Baseline: 24.30s Goal status: Met 06/18/22 14.16 sec  4.  Patient will demonstrate improved functional LE strength as demonstrated by 5/5. Goal status: IN PROGRESS  5.  Patient will score 52 on Berg Balance test to demonstrate lower risk of falls. (MCID= 8 points) .  Baseline: 44, 51/56 Goal status: IN PROGRESS   PLAN:  PT FREQUENCY: 2x/week  PT DURATION: 12 weeks  PLANNED INTERVENTIONS: Therapeutic exercises, Therapeutic activity, Neuromuscular re-education, Balance training, Gait training, Patient/Family education, Self Care, Joint mobilization, Stair training, Dry Needling, Electrical stimulation, Cryotherapy, Moist heat, Ionotophoresis 463mml Dexamethasone, and Manual therapy.  PLAN FOR NEXT SESSION: LE/hip strengthening, balance training (step ups on airex, side steps on airex, walking on beam, STS on airex), maybe recheck BERG    RoScot JunPTA 07/19/2022, 1:05 PM

## 2022-07-24 ENCOUNTER — Encounter: Payer: Self-pay | Admitting: Physical Therapy

## 2022-07-24 ENCOUNTER — Other Ambulatory Visit: Payer: Self-pay

## 2022-07-24 ENCOUNTER — Ambulatory Visit: Payer: Medicare Other | Attending: Neurosurgery | Admitting: Physical Therapy

## 2022-07-24 ENCOUNTER — Telehealth: Payer: Self-pay | Admitting: Internal Medicine

## 2022-07-24 DIAGNOSIS — M6281 Muscle weakness (generalized): Secondary | ICD-10-CM | POA: Diagnosis not present

## 2022-07-24 DIAGNOSIS — R296 Repeated falls: Secondary | ICD-10-CM | POA: Insufficient documentation

## 2022-07-24 DIAGNOSIS — M5459 Other low back pain: Secondary | ICD-10-CM | POA: Diagnosis not present

## 2022-07-24 DIAGNOSIS — R2689 Other abnormalities of gait and mobility: Secondary | ICD-10-CM | POA: Diagnosis not present

## 2022-07-24 MED ORDER — LEVOTHYROXINE SODIUM 50 MCG PO TABS: 50.0000 ug | ORAL_TABLET | Freq: Every day | ORAL | 3 refills | Status: DC

## 2022-07-24 NOTE — Telephone Encounter (Signed)
Patient needs her synthoid rx sent in again.  It usually goes to [facility] but they are out of it.  Patient would like it sent to [facility] on [address]  Patients #  [phone]  Patient's next appointment:  march, 2024

## 2022-07-24 NOTE — Therapy (Signed)
OUTPATIENT PHYSICAL THERAPY THORACOLUMBAR TREATMENT     Patient Name: [name] MRN: [id number] DOB:1941-07-04, 82 y.o., female Today's Date: 07/24/2022   PT End of Session - 07/24/22 1103     Visit Number 14    Date for PT Re-Evaluation 08/23/22    PT Start Time 1103    PT Stop Time 1145    PT Time Calculation (min) 42 min    Activity Tolerance Patient tolerated treatment well    Behavior During Therapy WFL for tasks assessed/performed               Past Medical History:  Diagnosis Date   Anxiety    Arthritis HANDS, NECK , BACK   Asymptomatic carotid artery stenosis LEFT --  MOD. PER DR WILLIS NOTE OCT 2012   Benign heart murmur    Echo 2011 in [place]   Depression    PMH of   Diabetes mellitus ORAL [facility]   DVT (deep venous thrombosis) ([place]) 2006   after arthroscopy   Frequency of urination    Headache    History of CVA (cerebrovascular accident) NEUROLOGIST  DR WILLIS----  06-01-2010-  LEFT BASAL GANGLIA HEMORRAGE   RESIDUAL RIGHT HAND NUMBNESS/TINGLING   History of laparoscopic cholecystectomy 05/24/2021   Hyperlipidemia    Hypertension    Hypothyroid    MRSA (methicillin resistant staph aureus) culture positive    X3; last post TKR   Nocturia    Numbness and tingling in right hand RESIDUAL FROM CVA NOV 2011   Obesity    Redux therapy   Osteoporosis    Stroke [facility])    Thyroid disease    right thyroid nodule-- BX DONE 08-15-2011   Past Surgical History:  Procedure Laterality Date   ABDOMINAL HYSTERECTOMY  1973   Endometriosis & fibroid   CARPAL TUNNEL RELEASE  2000   RIGHT   CHOLECYSTECTOMY N/A 05/23/2021   Procedure: LAPAROSCOPIC CHOLECYSTECTOMY;  Surgeon: Coralie Keens, MD;  Location: [facility];  Service: General;  Laterality: N/A;   COLONOSCOPY  1999 & 2005   Dr Olevia Perches   cystoscope     to evaluate recurrent UTIs   CYSTOSCOPY W/ RETROGRADES  08/22/2011   Procedure: CYSTOSCOPY WITH RETROGRADE PYELOGRAM;  Surgeon: Molli Hazard, MD;   Location: [facility];  Service: Urology;  Laterality: Bilateral;   CYSTOSCOPY WITH BIOPSY  08/22/2011   Procedure: CYSTOSCOPY WITH BIOPSY;  Surgeon: Molli Hazard, MD;  Location[facility];  Service: Urology;  Laterality: N/A;   g2 p2     JOINT REPLACEMENT  03-28-2006   LEFT THUMB   KNEE ARTHROSCOPY     BILATERAL PRIOR TO TOTAL KNEE   LEFT SHOULDER SURG  1990'S   LUMBAR LAMINECTOMY  08-26-2009   L 4 - 5   THYROID NODULE BX  08-15-2011   RIGHT   TOTAL KNEE ARTHROPLASTY  05-26-2002      LEFT   AND RIGHT  03-30-2098   WRIST SURGERY  2014   tendon placed in joint; Dr Burney Gauze   Patient Active Problem List   Diagnosis Date Noted   Sciatica of right side 03/27/2022   Calculus of gallbladder with acute cholecystitis without obstruction 05/17/2021   Tingling 03/16/2021   Vitamin B12 deficiency 03/16/2021   Head injury due to trauma 02/23/2021   Low back pain 10/29/2019   Pain in left wrist 10/01/2019   Cervicalgia 07/29/2019   Trigger finger, left middle finger 04/28/2018   Hypothyroidism 03/29/2017  Fecal incontinence 09/25/2016   Generalized anxiety disorder 06/20/2015   SUI (stress urinary incontinence, female) 02/05/2014   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   Cerebral artery occlusion with cerebral infarction ([place]) 05/31/2010   Diabetes ([place]) 01/24/2010   THYROID NODULE, RIGHT 01/21/2009   Vitamin D deficiency 12/10/2007   HYPERLIPIDEMIA 06/17/2007   Essential hypertension 06/17/2007   Osteopenia 06/17/2007   Osteoarthrosis, unspecified whether generalized or localized, unspecified site 11/11/2006    PCP: Billey Gosling  REFERRING PROVIDER: Earnie Larsson  REFERRING DIAG: M54.16  Rationale for Evaluation and Treatment: Rehabilitation  THERAPY DIAG:  Muscle weakness (generalized)  Other abnormalities of gait and mobility  Other low back pain  ONSET DATE: "about a year ago"  SUBJECTIVE:                                                                                                                                                                                            SUBJECTIVE STATEMENT: "I feel good" Been busy taking christmas stuff down without issue.  PERTINENT HISTORY:  Lumbar laminectomy L4-5  PAIN:  Are you having pain? No  PRECAUTIONS: None  WEIGHT BEARING RESTRICTIONS: No  FALLS:  Has patient fallen in last 6 months? Yes. Number of falls 5-6  LIVING ENVIRONMENT: Lives with: lives with their daughter Lives in: House/apartment Stairs: No Has following equipment at home: Single point cane and Environmental consultant - 2 wheeled  OCCUPATION: Retired  PLOF: Independent  PATIENT GOALS: not to fall, get my balance better   NEXT MD VISIT:   OBJECTIVE:   SCREENING FOR RED FLAGS: Bowel or bladder incontinence: No Spinal tumors: No Cauda equina syndrome: No Compression fracture: No Abdominal aneurysm: No  COGNITION: Overall cognitive status: Within functional limits for tasks assessed     SENSATION: WFL  MUSCLE LENGTH: Hamstrings: tightness BLE  POSTURE: rounded shoulders and forward head   LUMBAR ROM:   AROM eval  Flexion Mid shin  Extension 50% w/pain  Right lateral flexion Pain at end range  Left lateral flexion Pain at end range  Right rotation Pain at end range  Left rotation Pain at end range   (Blank rows = not tested)  LOWER EXTREMITY ROM:   grossly [facility]    LOWER EXTREMITY MMT:    MMT Right eval Left eval Right 07/09/22 Left 07/09/22  Hip flexion 3+ 3+ 4+ 4+  Hip extension 3+ 3+ 4 4  Hip abduction 3+ 3+ 4 4  Hip adduction      Hip internal rotation      Hip external rotation      Knee flexion _0 Knee extension  _0 Ankle dorsiflexion      Ankle plantarflexion      Ankle inversion      Ankle eversion       (Blank rows = not tested)  LUMBAR SPECIAL TESTS:  Straight leg raise test: Positive  FUNCTIONAL TESTS:  5 times sit to stand: 24.30s Timed up  and go (TUG): 16.13  GAIT: Distance walked: in clinic  Assistive device utilized: None Level of assistance: Modified independence Comments: poor foot clearance, trendelenberg, decreased step length, antalgic gait, slowed speed   TODAY'S TREATMENT:                                                                                                                              DATE:  07/24/21 Bike L3 x 6 min  Leg press 30lb 2x12 Rows 25lb and lats 20lb 2x10 Hamstring curls 20lb x12, 25lb x12 Leg Ext 10lb 2x12 Resisted sides step 30lb x5 each  07/19/22 NuStep L 5 x 6 min Rows and lats 20# 2x10 Shoulder Ext 10lb x10, 5lb x10 LE & Lumbar stretches w/ MHP to lumbar spine ~13 min   NuStep L5 x68mns HS curls 25# 2x10 Leg ext 10# 2x10 Standing on airex rows and ext green 2x10 Rows and lats 20# 2x10 Leg press 20# 2x10  07/11/22 NuStep L5 x 6 min S2S on airex OHP yellow wball 2x10 Resisted gait 30lb all directions x3 each Hamstring curls 25lb 2x10  Leg Ext 10lb 2x10  6in step ups x5 each  07/09/22 Bike L3 x672ms Recheck goals-- BERG, MMT Standing on foam heel taps 4" STS on airex 2x10 Walking beam Standing tandem on foam 11s R 15s on L Standing feet together of foam, EC 30s  Feet together on foam ball toss 10 catches Tandem on foam ball toss 5 catches   07/05/22 Bike L2 x 6 min Rows and Lats 15# 2x10 Standing shoulder Extension 5lb 2x10 S2S holding yellow ball 2x10   06/28/22 Bike L2 x6m74m  Rows and Lats 105# 2x10 BlackTB ext 2x10 BlackTB crunches 2x10 Supine stretching HS, IT band, piriformis Bridges with green band abd 2x10 SL clamshells greenTB 2x10 Leg ext 5# 2x10 HS curls 20# 2x10  06/25/22 Bike L2 x6mi15m Leg ext 10# 2x10 HS curls 25# 2x10 STS on airex 2x10 Walking beam side steps and narrow stance Shoulder ext 5# x10, 10# x10  Cable rows 5# 2x10  Leg press 20# 2x10  06/22/22 NuStep L5 x6min60mE on Pball bridges, K2C, small bridge x10 each HS,  piriformis, and glute stretch 30s each Bridges 2x10 S2S with red ball push out 2x10 Resisted gait 30# 4 x 4 way Rows and Lats 20# 2x10   06/18/22 NuStep L5 x6 min Leg ext 10# 2x10 HS curls 25# 2x10 Rows & Lats 20lb 2x10 Lumbar Ext black band 2x10 S2S OHP yellow wball 2x10 5lb standing shoulder Ext 2x10  06/12/22 NuStep L5 x6mins77meg ext 5#  2x10 HS curls 20# 2x10 Rows and lats 15# 2x10 Shoulder ext 5# 2x10 S2S on Airex 2x10- elevated mat table  Walking on beam    PATIENT EDUCATION:  Education details: HEP, hip and core weakness contributing to LBP, fall risk Person educated: Patient Education method: Explanation Education comprehension: verbalized understanding  HOME EXERCISE PROGRAM: Access Code: [email or web address] URL: [email or web address] Date: 05/31/2022 Prepared by: Andris Baumann  Exercises - Supine Bridge  - 1 x daily - 7 x weekly - 2 sets - 10 reps - Clamshell with Resistance  - 1 x daily - 7 x weekly - 2 sets - 10 reps - Standing Hip Abduction with Counter Support  - 1 x daily - 7 x weekly - 2 sets - 10 reps - Standing Hip Extension with Counter Support  - 1 x daily - 7 x weekly - 2 sets - 10 reps  ASSESSMENT:  CLINICAL IMPRESSION: Patient enters feeling well wit no pain. Session with a focus on functional LE strength.  Cue for full ROM needed with leg curls and extensions. Increase resistance tolerated with seated rows. Some fatigue reported with resisted sides tep needed rest between sets.   OBJECTIVE IMPAIRMENTS: Abnormal gait, decreased balance, difficulty walking, decreased strength, and pain.    PARTICIPATION LIMITATIONS: cleaning, laundry, and yard work  [employer] POTENTIAL: Good  CLINICAL DECISION MAKING: Stable/uncomplicated  EVALUATION COMPLEXITY: Low  GOALS: Goals reviewed with patient? No  SHORT TERM GOALS: Target date: 07/12/22  Patient will be independent with initial HEP. Goal status: Met 06/18/22  2.  Patient will be educated  on strategies to decrease risk of falls.  Baseline: 5-6 falls in the last 77month Goal status: MET   LONG TERM GOALS: Target date: 08/23/22  Patient will be independent with advanced/ongoing HEP to improve outcomes and carryover.  Goal status: Ongoing   2.  Patient will demonstrate decreased fall risk by scoring < 12 sec on TUG. Baseline: 16.31s Goal status: Met 9.17 sec 06/18/22  3.  Patient will demonstrate decreased fall risk by scoring < 15 sec on 5xSTS. Baseline: 24.30s Goal status: Met 06/18/22 14.16 sec  4.  Patient will demonstrate improved functional LE strength as demonstrated by 5/5. Goal status: IN PROGRESS  5.  Patient will score 52 on Berg Balance test to demonstrate lower risk of falls. (MCID= 8 points) .  Baseline: 44, 51/56 Goal status: IN PROGRESS   PLAN:  PT FREQUENCY: 2x/week  PT DURATION: 12 weeks  PLANNED INTERVENTIONS: Therapeutic exercises, Therapeutic activity, Neuromuscular re-education, Balance training, Gait training, Patient/Family education, Self Care, Joint mobilization, Stair training, Dry Needling, Electrical stimulation, Cryotherapy, Moist heat, Ionotophoresis 48mml Dexamethasone, and Manual therapy.  PLAN FOR NEXT SESSION: LE/hip strengthening, balance training (step ups on airex, side steps on airex, walking on beam, STS on airex), maybe recheck BERG    [place]ot JunPTA 07/24/2022, 11:04 AM

## 2022-07-24 NOTE — Telephone Encounter (Signed)
Sent in today 

## 2022-07-26 ENCOUNTER — Encounter: Payer: Self-pay | Admitting: Physical Therapy

## 2022-07-26 ENCOUNTER — Ambulatory Visit: Payer: Medicare Other | Admitting: Physical Therapy

## 2022-07-26 DIAGNOSIS — M5459 Other low back pain: Secondary | ICD-10-CM

## 2022-07-26 DIAGNOSIS — M6281 Muscle weakness (generalized): Secondary | ICD-10-CM

## 2022-07-26 DIAGNOSIS — R296 Repeated falls: Secondary | ICD-10-CM | POA: Diagnosis not present

## 2022-07-26 DIAGNOSIS — R2689 Other abnormalities of gait and mobility: Secondary | ICD-10-CM | POA: Diagnosis not present

## 2022-07-26 NOTE — Therapy (Signed)
OUTPATIENT PHYSICAL THERAPY THORACOLUMBAR TREATMENT     Patient Name: [name] MRN: [id number] DOB:05-Oct-1940, 81 y.o., female Today's Date: 07/26/2022   PT End of Session - 07/26/22 1102     Visit Number 15    Date for PT Re-Evaluation 08/23/22    PT Start Time 1102    PT Stop Time 1145    PT Time Calculation (min) 43 min    Activity Tolerance Patient tolerated treatment well    Behavior During Therapy WFL for tasks assessed/performed               Past Medical History:  Diagnosis Date   Anxiety    Arthritis HANDS, NECK , BACK   Asymptomatic carotid artery stenosis LEFT --  MOD. PER DR WILLIS NOTE OCT 2012   Benign heart murmur    Echo 2011 in [place]   Depression    PMH of   Diabetes mellitus ORAL MED   DVT (deep venous thrombosis) ([place]) 2006   after arthroscopy   Frequency of urination    Headache    History of CVA (cerebrovascular accident) NEUROLOGIST  DR WILLIS----  06-01-2010-  LEFT BASAL GANGLIA HEMORRAGE   RESIDUAL RIGHT HAND NUMBNESS/TINGLING   History of laparoscopic cholecystectomy 05/24/2021   Hyperlipidemia    Hypertension    Hypothyroid    MRSA (methicillin resistant staph aureus) culture positive    X3; last post TKR   Nocturia    Numbness and tingling in right hand RESIDUAL FROM CVA NOV 2011   Obesity    Redux therapy   Osteoporosis    Stroke [facility])    Thyroid disease    right thyroid nodule-- BX DONE 08-15-2011   Past Surgical History:  Procedure Laterality Date   ABDOMINAL HYSTERECTOMY  1973   Endometriosis & fibroid   CARPAL TUNNEL RELEASE  2000   RIGHT   CHOLECYSTECTOMY N/A 05/23/2021   Procedure: LAPAROSCOPIC CHOLECYSTECTOMY;  Surgeon: Coralie Keens, MD;  Location: [facility];  Service: General;  Laterality: N/A;   COLONOSCOPY  1999 & 2005   Dr Olevia Perches   cystoscope     to evaluate recurrent UTIs   CYSTOSCOPY W/ RETROGRADES  08/22/2011   Procedure: CYSTOSCOPY WITH RETROGRADE PYELOGRAM;  Surgeon: Molli Hazard, MD;   Location: [facility];  Service: Urology;  Laterality: Bilateral;   CYSTOSCOPY WITH BIOPSY  08/22/2011   Procedure: CYSTOSCOPY WITH BIOPSY;  Surgeon: Molli Hazard, MD;  Location: [facility];  Service: Urology;  Laterality: N/A;   g2 p2     JOINT REPLACEMENT  03-28-2006   LEFT THUMB   KNEE ARTHROSCOPY     BILATERAL PRIOR TO TOTAL KNEE   LEFT SHOULDER SURG  1990'S   LUMBAR LAMINECTOMY  08-26-2009   L 4 - 5   THYROID NODULE BX  08-15-2011   RIGHT   TOTAL KNEE ARTHROPLASTY  05-26-2002      LEFT   AND RIGHT  03-30-2098   WRIST SURGERY  2014   tendon placed in joint; Dr Burney Gauze   Patient Active Problem List   Diagnosis Date Noted   Sciatica of right side 03/27/2022   Calculus of gallbladder with acute cholecystitis without obstruction 05/17/2021   Tingling 03/16/2021   Vitamin B12 deficiency 03/16/2021   Head injury due to trauma 02/23/2021   Low back pain 10/29/2019   Pain in left wrist 10/01/2019   Cervicalgia 07/29/2019   Trigger finger, left middle finger 04/28/2018   Hypothyroidism 03/29/2017  Fecal incontinence 09/25/2016   Generalized anxiety disorder 06/20/2015   SUI (stress urinary incontinence, female) 02/05/2014   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   Cerebral artery occlusion with cerebral infarction ([place]) 05/31/2010   Diabetes ([place]) 01/24/2010   THYROID NODULE, RIGHT 01/21/2009   Vitamin D deficiency 12/10/2007   HYPERLIPIDEMIA 06/17/2007   Essential hypertension 06/17/2007   Osteopenia 06/17/2007   Osteoarthrosis, unspecified whether generalized or localized, unspecified site 11/11/2006    PCP: Billey Gosling  REFERRING PROVIDER: Earnie Larsson  REFERRING DIAG: M54.16  Rationale for Evaluation and Treatment: Rehabilitation  THERAPY DIAG:  Muscle weakness (generalized)  Other abnormalities of gait and mobility  Other low back pain  ONSET DATE: "about a year ago"  SUBJECTIVE:                                                                                                                                                                                            SUBJECTIVE STATEMENT: "I feel good"  PERTINENT HISTORY:  Lumbar laminectomy L4-5  PAIN:  Are you having pain? No  PRECAUTIONS: None  WEIGHT BEARING RESTRICTIONS: No  FALLS:  Has patient fallen in last 6 months? Yes. Number of falls 5-6  LIVING ENVIRONMENT: Lives with: lives with their daughter Lives in: House/apartment Stairs: No Has following equipment at home: Single point cane and Environmental consultant - 2 wheeled  OCCUPATION: Retired  PLOF: Independent  PATIENT GOALS: not to fall, get my balance better   NEXT MD VISIT:   OBJECTIVE:   SCREENING FOR RED FLAGS: Bowel or bladder incontinence: No Spinal tumors: No Cauda equina syndrome: No Compression fracture: No Abdominal aneurysm: No  COGNITION: Overall cognitive status: Within functional limits for tasks assessed     SENSATION: WFL  MUSCLE LENGTH: Hamstrings: tightness BLE  POSTURE: rounded shoulders and forward head   LUMBAR ROM:   AROM eval  Flexion Mid shin  Extension 50% w/pain  Right lateral flexion Pain at end range  Left lateral flexion Pain at end range  Right rotation Pain at end range  Left rotation Pain at end range   (Blank rows = not tested)  LOWER EXTREMITY ROM:   grossly [facility]    LOWER EXTREMITY MMT:    MMT Right eval Left eval Right 07/09/22 Left 07/09/22   Hip flexion 3+ 3+ 4+ 4+ 4+  Hip extension 3+ 3+ 4 4 4+  Hip abduction 3+ 3+ _0 Hip adduction       Hip internal rotation       Hip external rotation       Knee flexion _1 Knee extension  _0 Ankle dorsiflexion       Ankle plantarflexion       Ankle inversion       Ankle eversion        (Blank rows = not tested)  LUMBAR SPECIAL TESTS:  Straight leg raise test: Positive  FUNCTIONAL TESTS:  5 times sit to stand: 24.30s Timed up and go (TUG):  16.13  GAIT: Distance walked: in clinic  Assistive device utilized: None Level of assistance: Modified independence Comments: poor foot clearance, trendelenberg, decreased step length, antalgic gait, slowed speed   TODAY'S TREATMENT:                                                                                                                              DATE:  07/26/22 Bike L3 x 6 min Leg press 30lb 2x10  Hamstring curls 25lb 2x12 Leg extension 10lb 2x10 Shoulder Ext 10lb 2x10 S2S OHP yellow ball 2x10 Step ups 4 in box on airex x10  07/24/21 Bike L3 x 6 min  Leg press 30lb 2x12 Rows 25lb and lats 20lb 2x10 Hamstring curls 20lb x12, 25lb x12 Leg Ext 10lb 2x12 Resisted sides step 30lb x5 each  07/19/22 NuStep L 5 x 6 min Rows and lats 20# 2x10 Shoulder Ext 10lb x10, 5lb x10 LE & Lumbar stretches w/ MHP to lumbar spine ~13 min   NuStep L5 x58mns HS curls 25# 2x10 Leg ext 10# 2x10 Standing on airex rows and ext green 2x10 Rows and lats 20# 2x10 Leg press 20# 2x10  07/11/22 NuStep L5 x 6 min S2S on airex OHP yellow wball 2x10 Resisted gait 30lb all directions x3 each Hamstring curls 25lb 2x10  Leg Ext 10lb 2x10  6in step ups x5 each  07/09/22 Bike L3 x617ms Recheck goals-- BERG, MMT Standing on foam heel taps 4" STS on airex 2x10 Walking beam Standing tandem on foam 11s R 15s on L Standing feet together of foam, EC 30s  Feet together on foam ball toss 10 catches Tandem on foam ball toss 5 catches   07/05/22 Bike L2 x 6 min Rows and Lats 15# 2x10 Standing shoulder Extension 5lb 2x10 S2S holding yellow ball 2x10   06/28/22 Bike L2 x6m96m  Rows and Lats 105# 2x10 BlackTB ext 2x10 BlackTB crunches 2x10 Supine stretching HS, IT band, piriformis Bridges with green band abd 2x10 SL clamshells greenTB 2x10 Leg ext 5# 2x10 HS curls 20# 2x10  06/25/22 Bike L2 x6mi58m Leg ext 10# 2x10 HS curls 25# 2x10 STS on airex 2x10 Walking beam side steps and  narrow stance Shoulder ext 5# x10, 10# x10  Cable rows 5# 2x10  Leg press 20# 2x10  06/22/22 NuStep L5 x6min66mE on Pball bridges, K2C, small bridge x10 each HS, piriformis, and glute stretch 30s each Bridges 2x10 S2S with red ball push out 2x10 Resisted gait 30# 4 x 4 way Rows and Lats 20# 2x10  06/18/22 NuStep L5 x6 min Leg ext 10# 2x10 HS curls 25# 2x10 Rows & Lats 20lb 2x10 Lumbar Ext black band 2x10 S2S OHP yellow wball 2x10 5lb standing shoulder Ext 2x10  06/12/22 NuStep L5 x6mins  Leg ext 5# 2x10 HS curls 20# 2x10 Rows and lats 15# 2x10 Shoulder ext 5# 2x10 S2S on Airex 2x10- elevated mat table  Walking on beam    PATIENT EDUCATION:  Education details: HEP, hip and core weakness contributing to LBP, fall risk Person educated: Patient Education method: Explanation Education comprehension: verbalized understanding  HOME EXERCISE PROGRAM: Access Code: [email or web address] URL: [email or web address] Date: 05/31/2022 Prepared by: Mona Sajjad  Exercises - Supine Bridge  - 1 x daily - 7 x weekly - 2 sets - 10 reps - Clamshell with Resistance  - 1 x daily - 7 x weekly - 2 sets - 10 reps - Standing Hip Abduction with Counter Support  - 1 x daily - 7 x weekly - 2 sets - 10 reps - Standing Hip Extension with Counter Support  - 1 x daily - 7 x weekly - 2 sets - 10 reps  ASSESSMENT:  CLINICAL IMPRESSION: Patient enters feeling well with no pain. Session with a focus on functional LE strength.  She has progressed increasing her LE strength. Postural cue needed with shoulder extensions. LLE weakness present with step ups.      OBJECTIVE IMPAIRMENTS: Abnormal gait, decreased balance, difficulty walking, decreased strength, and pain.    PARTICIPATION LIMITATIONS: cleaning, laundry, and yard work  REHAB POTENTIAL: Good  CLINICAL DECISION MAKING: Stable/uncomplicated  EVALUATION COMPLEXITY: Low  GOALS: Goals reviewed with patient? No  SHORT TERM GOALS:  Target date: 07/12/22  Patient will be independent with initial HEP. Goal status: Met 06/18/22  2.  Patient will be educated on strategies to decrease risk of falls.  Baseline: 5-6 falls in the last 6months Goal status: MET   LONG TERM GOALS: Target date: 08/23/22  Patient will be independent with advanced/ongoing HEP to improve outcomes and carryover.  Goal status: Met   2.  Patient will demonstrate decreased fall risk by scoring < 12 sec on TUG. Baseline: 16.31s Goal status: Met 9.17 sec 06/18/22  3.  Patient will demonstrate decreased fall risk by scoring < 15 sec on 5xSTS. Baseline: 24.30s Goal status: Met 06/18/22 14.16 sec  4.  Patient will demonstrate improved functional LE strength as demonstrated by 5/5. Goal status: IN PROGRESS 07/26/22  5.  Patient will score 52 on Berg Balance test to demonstrate lower risk of falls. (MCID= 8 points) .  Baseline: 44, 51/56 Goal status: IN PROGRESS   PLAN:  PT FREQUENCY: 2x/week  PT DURATION: 12 weeks  PLANNED INTERVENTIONS: Therapeutic exercises, Therapeutic activity, Neuromuscular re-education, Balance training, Gait training, Patient/Family education, Self Care, Joint mobilization, Stair training, Dry Needling, Electrical stimulation, Cryotherapy, Moist heat, Ionotophoresis 4mg/ml Dexamethasone, and Manual therapy.  PLAN FOR NEXT SESSION: LE/hip strengthening, balance training (step ups on airex, side steps on airex, walking on beam, STS on airex), maybe recheck BERG     G , PTA 07/26/2022, 11:03 AM  

## 2022-07-30 NOTE — Therapy (Signed)
OUTPATIENT PHYSICAL THERAPY THORACOLUMBAR TREATMENT     Patient Name: DOMINIK LAURICELLA MRN: [id number] DOB:1941/01/14, 82 y.o., female Today's Date: 07/31/2022   PT End of Session - 07/31/22 1103     Visit Number 16    Date for PT Re-Evaluation 08/23/22    PT Start Time 1103    PT Stop Time 1145    PT Time Calculation (min) 42 min    Activity Tolerance Patient tolerated treatment well    Behavior During Therapy WFL for tasks assessed/performed                Past Medical History:  Diagnosis Date   Anxiety    Arthritis HANDS, NECK , BACK   Asymptomatic carotid artery stenosis LEFT --  MOD. PER DR WILLIS NOTE OCT 2012   Benign heart murmur    Echo 2011 in [place]   Depression    PMH of   Diabetes mellitus ORAL MED   DVT (deep venous thrombosis) ([place]) 2006   after arthroscopy   Frequency of urination    Headache    History of CVA (cerebrovascular accident) NEUROLOGIST  DR WILLIS----  06-01-2010-  LEFT BASAL GANGLIA HEMORRAGE   RESIDUAL RIGHT HAND NUMBNESS/TINGLING   History of laparoscopic cholecystectomy 05/24/2021   Hyperlipidemia    Hypertension    Hypothyroid    MRSA (methicillin resistant staph aureus) culture positive    X3; last post TKR   Nocturia    Numbness and tingling in right hand RESIDUAL FROM CVA NOV 2011   Obesity    Redux therapy   Osteoporosis    Stroke [facility])    Thyroid disease    right thyroid nodule-- BX DONE 08-15-2011   Past Surgical History:  Procedure Laterality Date   ABDOMINAL HYSTERECTOMY  1973   Endometriosis & fibroid   CARPAL TUNNEL RELEASE  2000   RIGHT   CHOLECYSTECTOMY N/A 05/23/2021   Procedure: LAPAROSCOPIC CHOLECYSTECTOMY;  Surgeon: Coralie Keens, MD;  Location: [facility];  Service: General;  Laterality: N/A;   COLONOSCOPY  1999 & 2005   Dr Olevia Perches   cystoscope     to evaluate recurrent UTIs   CYSTOSCOPY W/ RETROGRADES  08/22/2011   Procedure: CYSTOSCOPY WITH RETROGRADE PYELOGRAM;  Surgeon: Molli Hazard, MD;   [facility];  Service: Urology;  Laterality: Bilateral;   CYSTOSCOPY WITH BIOPSY  08/22/2011   Procedure: CYSTOSCOPY WITH BIOPSY;  Surgeon: Molli Hazard, MD;  Location: [facility];  Service: Urology;  Laterality: N/A;   g2 p2     JOINT REPLACEMENT  03-28-2006   LEFT THUMB   KNEE ARTHROSCOPY     BILATERAL PRIOR TO TOTAL KNEE   LEFT SHOULDER SURG  1990'S   LUMBAR LAMINECTOMY  08-26-2009   L 4 - 5   THYROID NODULE BX  08-15-2011   RIGHT   TOTAL KNEE ARTHROPLASTY  05-26-2002      LEFT   AND RIGHT  03-30-2098   WRIST SURGERY  2014   tendon placed in joint; Dr Burney Gauze   Patient Active Problem List   Diagnosis Date Noted   Sciatica of right side 03/27/2022   Calculus of gallbladder with acute cholecystitis without obstruction 05/17/2021   Tingling 03/16/2021   Vitamin B12 deficiency 03/16/2021   Head injury due to trauma 02/23/2021   Low back pain 10/29/2019   Pain in left wrist 10/01/2019   Cervicalgia 07/29/2019   Trigger finger, left middle finger 04/28/2018   Hypothyroidism 03/29/2017  Fecal incontinence 09/25/2016   Generalized anxiety disorder 06/20/2015   SUI (stress urinary incontinence, female) 02/05/2014   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   Cerebral artery occlusion with cerebral infarction ([place]) 05/31/2010   Diabetes ([place]) 01/24/2010   THYROID NODULE, RIGHT 01/21/2009   Vitamin D deficiency 12/10/2007   HYPERLIPIDEMIA 06/17/2007   Essential hypertension 06/17/2007   Osteopenia 06/17/2007   Osteoarthrosis, unspecified whether generalized or localized, unspecified site 11/11/2006    PCP: Billey Gosling  REFERRING PROVIDER: Earnie Larsson  REFERRING DIAG: M54.16  Rationale for Evaluation and Treatment: Rehabilitation  THERAPY DIAG:  Muscle weakness (generalized)  Other abnormalities of gait and mobility  Repeated falls  ONSET DATE: "about a year ago"  SUBJECTIVE:                                                                                                                                                                                            SUBJECTIVE STATEMENT: "I feel good, nothing new."  PERTINENT HISTORY:  Lumbar laminectomy L4-5  PAIN:  Are you having pain? No  PRECAUTIONS: None  WEIGHT BEARING RESTRICTIONS: No  FALLS:  Has patient fallen in last 6 months? Yes. Number of falls 5-6  LIVING ENVIRONMENT: Lives with: lives with their daughter Lives in: House/apartment Stairs: No Has following equipment at home: Single point cane and Environmental consultant - 2 wheeled  OCCUPATION: Retired  PLOF: Independent  PATIENT GOALS: not to fall, get my balance better   NEXT MD VISIT:   OBJECTIVE:   SCREENING FOR RED FLAGS: Bowel or bladder incontinence: No Spinal tumors: No Cauda equina syndrome: No Compression fracture: No Abdominal aneurysm: No  COGNITION: Overall cognitive status: Within functional limits for tasks assessed     SENSATION: WFL  MUSCLE LENGTH: Hamstrings: tightness BLE  POSTURE: rounded shoulders and forward head   LUMBAR ROM:   AROM eval  Flexion Mid shin  Extension 50% w/pain  Right lateral flexion Pain at end range  Left lateral flexion Pain at end range  Right rotation Pain at end range  Left rotation Pain at end range   (Blank rows = not tested)  LOWER EXTREMITY ROM:   grossly [facility]    LOWER EXTREMITY MMT:    MMT Right eval Left eval Right 07/09/22 Left 07/09/22   Hip flexion 3+ 3+ 4+ 4+ 4+  Hip extension 3+ 3+ 4 4 4+  Hip abduction 3+ 3+ '4 4 4  '$ Hip adduction       Hip internal rotation       Hip external rotation       Knee flexion '4 4 5 5   '$ Knee extension  $'4 4 5 5   'f$ Ankle dorsiflexion       Ankle plantarflexion       Ankle inversion       Ankle eversion        (Blank rows = not tested)  LUMBAR SPECIAL TESTS:  Straight leg raise test: Positive  FUNCTIONAL TESTS:  5 times sit to stand: 24.30s Timed up and go (TUG):  16.13  GAIT: Distance walked: in clinic  Assistive device utilized: None Level of assistance: Modified independence Comments: poor foot clearance, trendelenberg, decreased step length, antalgic gait, slowed speed   TODAY'S TREATMENT:                                                                                                                              DATE:  07/31/22 Bike L4 x87mns  Leg ext 10# 2x10 HS curls 25# 2x10 Leg press 20# 2x10 Resisted gait 30# 4 x 4way  Step up 4" on airex  Side step ups on BOSU   07/26/22 Bike L3 x 6 min Leg press 30lb 2x10  Hamstring curls 25lb 2x12 Leg extension 10lb 2x10 Shoulder Ext 10lb 2x10 S2S OHP yellow ball 2x10 Step ups 4 in box on airex x10  07/24/21 Bike L3 x 6 min  Leg press 30lb 2x12 Rows 25lb and lats 20lb 2x10 Hamstring curls 20lb x12, 25lb x12 Leg Ext 10lb 2x12 Resisted sides step 30lb x5 each  07/19/22 NuStep L 5 x 6 min Rows and lats 20# 2x10 Shoulder Ext 10lb x10, 5lb x10 LE & Lumbar stretches w/ MHP to lumbar spine ~13 min   NuStep L5 x541ms HS curls 25# 2x10 Leg ext 10# 2x10 Standing on airex rows and ext green 2x10 Rows and lats 20# 2x10 Leg press 20# 2x10  07/11/22 NuStep L5 x 6 min S2S on airex OHP yellow wball 2x10 Resisted gait 30lb all directions x3 each Hamstring curls 25lb 2x10  Leg Ext 10lb 2x10  6in step ups x5 each  07/09/22 Bike L3 x6m71m Recheck goals-- BERG, MMT Standing on foam heel taps 4" STS on airex 2x10 Walking beam Standing tandem on foam 11s R 15s on L Standing feet together of foam, EC 30s  Feet together on foam ball toss 10 catches Tandem on foam ball toss 5 catches   07/05/22 Bike L2 x 6 min Rows and Lats 15# 2x10 Standing shoulder Extension 5lb 2x10 S2S holding yellow ball 2x10   06/28/22 Bike L2 x6mi68m Rows and Lats 105# 2x10 BlackTB ext 2x10 BlackTB crunches 2x10 Supine stretching HS, IT band, piriformis Bridges with green band abd 2x10 SL clamshells greenTB  2x10 Leg ext 5# 2x10 HS curls 20# 2x10  06/25/22 Bike L2 x6min2mLeg ext 10# 2x10 HS curls 25# 2x10 STS on airex 2x10 Walking beam side steps and narrow stance Shoulder ext 5# x10, 10# x10  Cable rows 5# 2x10  Leg press 20# 2x10  06/22/22 NuStep L5 x6mins68m on  Pball bridges, K2C, small bridge x10 each HS, piriformis, and glute stretch 30s each Bridges 2x10 S2S with red ball push out 2x10 Resisted gait 30# 4 x 4 way Rows and Lats 20# 2x10   06/18/22 NuStep L5 x6 min Leg ext 10# 2x10 HS curls 25# 2x10 Rows & Lats 20lb 2x10 Lumbar Ext black band 2x10 S2S OHP yellow wball 2x10 5lb standing shoulder Ext 2x10  06/12/22 NuStep L5 x105mns  Leg ext 5# 2x10 HS curls 20# 2x10 Rows and lats 15# 2x10 Shoulder ext 5# 2x10 S2S on Airex 2x10- elevated mat table  Walking on beam    PATIENT EDUCATION:  Education details: HEP, hip and core weakness contributing to LBP, fall risk Person educated: Patient Education method: Explanation Education comprehension: verbalized understanding  HOME EXERCISE PROGRAM: Access Code: [email or web address] URL: [email or web address] Date: 05/31/2022 Prepared by: MAndris Baumann Exercises - Supine Bridge  - 1 x daily - 7 x weekly - 2 sets - 10 reps - Clamshell with Resistance  - 1 x daily - 7 x weekly - 2 sets - 10 reps - Standing Hip Abduction with Counter Support  - 1 x daily - 7 x weekly - 2 sets - 10 reps - Standing Hip Extension with Counter Support  - 1 x daily - 7 x weekly - 2 sets - 10 reps  ASSESSMENT:  CLINICAL IMPRESSION: Patient enters feeling well with no pain. Session with a focus on functional LE strengthening and balance.  She is more stable on her feet today and seems to be showing good progress overall.    OBJECTIVE IMPAIRMENTS: Abnormal gait, decreased balance, difficulty walking, decreased strength, and pain.    PARTICIPATION LIMITATIONS: cleaning, laundry, and yard work  RBrink's CompanyPOTENTIAL: Good  CLINICAL DECISION  MAKING: Stable/uncomplicated  EVALUATION COMPLEXITY: Low  GOALS: Goals reviewed with patient? No  SHORT TERM GOALS: Target date: 07/12/22  Patient will be independent with initial HEP. Goal status: Met 06/18/22  2.  Patient will be educated on strategies to decrease risk of falls.  Baseline: 5-6 falls in the last 670monthGoal status: MET   LONG TERM GOALS: Target date: 08/23/22  Patient will be independent with advanced/ongoing HEP to improve outcomes and carryover.  Goal status: Met   2.  Patient will demonstrate decreased fall risk by scoring < 12 sec on TUG. Baseline: 16.31s Goal status: Met 9.17 sec 06/18/22  3.  Patient will demonstrate decreased fall risk by scoring < 15 sec on 5xSTS. Baseline: 24.30s Goal status: Met 06/18/22 14.16 sec  4.  Patient will demonstrate improved functional LE strength as demonstrated by 5/5. Goal status: IN PROGRESS 07/26/22  5.  Patient will score 52 on Berg Balance test to demonstrate lower risk of falls. (MCID= 8 points) .  Baseline: 44, 51/56 Goal status: IN PROGRESS   PLAN:  PT FREQUENCY: 2x/week  PT DURATION: 12 weeks  PLANNED INTERVENTIONS: Therapeutic exercises, Therapeutic activity, Neuromuscular re-education, Balance training, Gait training, Patient/Family education, Self Care, Joint mobilization, Stair training, Dry Needling, Electrical stimulation, Cryotherapy, Moist heat, Ionotophoresis '4mg'$ /ml Dexamethasone, and Manual therapy.  PLAN FOR NEXT SESSION: LE/hip strengthening, balance training (step ups on airex, side steps on airex, walking on beam, STS on airex), maybe [facility]PT 07/31/2022, 11:47 AM

## 2022-07-31 ENCOUNTER — Ambulatory Visit: Payer: Medicare Other

## 2022-07-31 DIAGNOSIS — M5459 Other low back pain: Secondary | ICD-10-CM | POA: Diagnosis not present

## 2022-07-31 DIAGNOSIS — R296 Repeated falls: Secondary | ICD-10-CM | POA: Diagnosis not present

## 2022-07-31 DIAGNOSIS — R2689 Other abnormalities of gait and mobility: Secondary | ICD-10-CM | POA: Diagnosis not present

## 2022-07-31 DIAGNOSIS — M6281 Muscle weakness (generalized): Secondary | ICD-10-CM | POA: Diagnosis not present

## 2022-08-02 ENCOUNTER — Encounter: Payer: Self-pay | Admitting: Physical Therapy

## 2022-08-02 ENCOUNTER — Ambulatory Visit: Payer: Medicare Other | Admitting: Physical Therapy

## 2022-08-02 DIAGNOSIS — R296 Repeated falls: Secondary | ICD-10-CM

## 2022-08-02 DIAGNOSIS — R2689 Other abnormalities of gait and mobility: Secondary | ICD-10-CM | POA: Diagnosis not present

## 2022-08-02 DIAGNOSIS — M5459 Other low back pain: Secondary | ICD-10-CM | POA: Diagnosis not present

## 2022-08-02 DIAGNOSIS — M6281 Muscle weakness (generalized): Secondary | ICD-10-CM | POA: Diagnosis not present

## 2022-08-02 NOTE — Therapy (Signed)
OUTPATIENT PHYSICAL THERAPY THORACOLUMBAR TREATMENT     Patient Name: [name] MRN: [id number] DOB:Nov 28, 1940, 82 y.o., female Today's Date: 08/02/2022   PT End of Session - 08/02/22 1106     Visit Number 17    Date for PT Re-Evaluation 08/23/22    PT Start Time 1102    PT Stop Time 1145    PT Time Calculation (min) 43 min    Activity Tolerance Patient tolerated treatment well    Behavior During Therapy WFL for tasks assessed/performed                Past Medical History:  Diagnosis Date   Anxiety    Arthritis HANDS, NECK , BACK   Asymptomatic carotid artery stenosis LEFT --  MOD. PER DR WILLIS NOTE OCT 2012   Benign heart murmur    Echo 2011 in [place]   Depression    PMH of   Diabetes mellitus ORAL MED   DVT (deep venous thrombosis) ([place]) 2006   after arthroscopy   Frequency of urination    Headache    History of CVA (cerebrovascular accident) NEUROLOGIST  DR WILLIS----  06-01-2010-  LEFT BASAL GANGLIA HEMORRAGE   RESIDUAL RIGHT HAND NUMBNESS/TINGLING   History of laparoscopic cholecystectomy 05/24/2021   Hyperlipidemia    Hypertension    Hypothyroid    MRSA (methicillin resistant staph aureus) culture positive    X3; last post TKR   Nocturia    Numbness and tingling in right hand RESIDUAL FROM CVA NOV 2011   Obesity    Redux therapy   Osteoporosis    Stroke [facility])    Thyroid disease    right thyroid nodule-- BX DONE 08-15-2011   Past Surgical History:  Procedure Laterality Date   ABDOMINAL HYSTERECTOMY  1973   Endometriosis & fibroid   CARPAL TUNNEL RELEASE  2000   RIGHT   CHOLECYSTECTOMY N/A 05/23/2021   Procedure: LAPAROSCOPIC CHOLECYSTECTOMY;  Surgeon: Coralie Keens, MD;  Location: [facility];  Service: General;  Laterality: N/A;   COLONOSCOPY  1999 & 2005   Dr Olevia Perches   cystoscope     to evaluate recurrent UTIs   CYSTOSCOPY W/ RETROGRADES  08/22/2011   Procedure: CYSTOSCOPY WITH RETROGRADE PYELOGRAM;  Surgeon: Molli Hazard, MD;   Location: [facility];  Service: Urology;  Laterality: Bilateral;   CYSTOSCOPY WITH BIOPSY  08/22/2011   Procedure: CYSTOSCOPY WITH BIOPSY;  Surgeon: Molli Hazard, MD;  Location: [facility];  Service: Urology;  Laterality: N/A;   g2 p2     JOINT REPLACEMENT  03-28-2006   LEFT THUMB   KNEE ARTHROSCOPY     BILATERAL PRIOR TO TOTAL KNEE   LEFT SHOULDER SURG  1990'S   LUMBAR LAMINECTOMY  08-26-2009   L 4 - 5   THYROID NODULE BX  08-15-2011   RIGHT   TOTAL KNEE ARTHROPLASTY  05-26-2002      LEFT   AND RIGHT  03-30-2098   WRIST SURGERY  2014   tendon placed in joint; Dr Burney Gauze   Patient Active Problem List   Diagnosis Date Noted   Sciatica of right side 03/27/2022   Calculus of gallbladder with acute cholecystitis without obstruction 05/17/2021   Tingling 03/16/2021   Vitamin B12 deficiency 03/16/2021   Head injury due to trauma 02/23/2021   Low back pain 10/29/2019   Pain in left wrist 10/01/2019   Cervicalgia 07/29/2019   Trigger finger, left middle finger 04/28/2018   Hypothyroidism 03/29/2017  Fecal incontinence 09/25/2016   Generalized anxiety disorder 06/20/2015   SUI (stress urinary incontinence, female) 02/05/2014   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   Cerebral artery occlusion with cerebral infarction ([place]) 05/31/2010   Diabetes ([place]) 01/24/2010   THYROID NODULE, RIGHT 01/21/2009   Vitamin D deficiency 12/10/2007   HYPERLIPIDEMIA 06/17/2007   Essential hypertension 06/17/2007   Osteopenia 06/17/2007   Osteoarthrosis, unspecified whether generalized or localized, unspecified site 11/11/2006    PCP: Billey Gosling  REFERRING PROVIDER: Earnie Larsson  REFERRING DIAG: M54.16  Rationale for Evaluation and Treatment: Rehabilitation  THERAPY DIAG:  Muscle weakness (generalized)  Other abnormalities of gait and mobility  Repeated falls  ONSET DATE: "about a year ago"  SUBJECTIVE:                                                                                                                                                                                            SUBJECTIVE STATEMENT: "Had a fall yesterday" Pt reports trying to avoid stepping in a puddle, and thinks she stepped on her on foot. Some pain in the L rib area. No injuries, pt able to get up on her own  PERTINENT HISTORY:  Lumbar laminectomy L4-5  PAIN:  Are you having pain? 3/10 L Rib area and side  PRECAUTIONS: None  WEIGHT BEARING RESTRICTIONS: No  FALLS:  Has patient fallen in last 6 months? Yes. Number of falls 5-6  LIVING ENVIRONMENT: Lives with: lives with their daughter Lives in: House/apartment Stairs: No Has following equipment at home: Single point cane and Environmental consultant - 2 wheeled  OCCUPATION: Retired  PLOF: Independent  PATIENT GOALS: not to fall, get my balance better   NEXT MD VISIT:   OBJECTIVE:   SCREENING FOR RED FLAGS: Bowel or bladder incontinence: No Spinal tumors: No Cauda equina syndrome: No Compression fracture: No Abdominal aneurysm: No  COGNITION: Overall cognitive status: Within functional limits for tasks assessed     SENSATION: WFL  MUSCLE LENGTH: Hamstrings: tightness BLE  POSTURE: rounded shoulders and forward head   LUMBAR ROM:   AROM eval  Flexion Mid shin  Extension 50% w/pain  Right lateral flexion Pain at end range  Left lateral flexion Pain at end range  Right rotation Pain at end range  Left rotation Pain at end range   (Blank rows = not tested)  LOWER EXTREMITY ROM:   grossly [facility]    LOWER EXTREMITY MMT:    MMT Right eval Left eval Right 07/09/22 Left 07/09/22   Hip flexion 3+ 3+ 4+ 4+ 4+  Hip extension 3+ 3+ 4 4 4+  Hip abduction 3+ 3+ 4 4  4  Hip adduction       Hip internal rotation       Hip external rotation       Knee flexion '4 4 5 5   '$ Knee extension '4 4 5 5   '$ Ankle dorsiflexion       Ankle plantarflexion       Ankle inversion       Ankle eversion         (Blank rows = not tested)  LUMBAR SPECIAL TESTS:  Straight leg raise test: Positive  FUNCTIONAL TESTS:  5 times sit to stand: 24.30s Timed up and go (TUG): 16.13  GAIT: Distance walked: in clinic  Assistive device utilized: None Level of assistance: Modified independence Comments: poor foot clearance, trendelenberg, decreased step length, antalgic gait, slowed speed   TODAY'S TREATMENT:                                                                                                                              DATE:  08/02/22 NuStep L5 x 6 min Seated OHP yellow ball 2x10 S2S holding yellow ball 2x10  Seated Rows & Lats 20lb 2x10  Hamstring curls 20lb 3x10 Leg Ext 10lb 2x10 Negotiating over objects forward and side steps   07/31/22 Bike L4 x89mns  Leg ext 10# 2x10 HS curls 25# 2x10 Leg press 20# 2x10 Resisted gait 30# 4 x 4way  Step up 4" on airex  Side step ups on BOSU   07/26/22 Bike L3 x 6 min Leg press 30lb 2x10  Hamstring curls 25lb 2x12 Leg extension 10lb 2x10 Shoulder Ext 10lb 2x10 S2S OHP yellow ball 2x10 Step ups 4 in box on airex x10  07/24/21 Bike L3 x 6 min  Leg press 30lb 2x12 Rows 25lb and lats 20lb 2x10 Hamstring curls 20lb x12, 25lb x12 Leg Ext 10lb 2x12 Resisted sides step 30lb x5 each  07/19/22 NuStep L 5 x 6 min Rows and lats 20# 2x10 Shoulder Ext 10lb x10, 5lb x10 LE & Lumbar stretches w/ MHP to lumbar spine ~13 min   NuStep L5 x554ms HS curls 25# 2x10 Leg ext 10# 2x10 Standing on airex rows and ext green 2x10 Rows and lats 20# 2x10 Leg press 20# 2x10  07/11/22 NuStep L5 x 6 min S2S on airex OHP yellow wball 2x10 Resisted gait 30lb all directions x3 each Hamstring curls 25lb 2x10  Leg Ext 10lb 2x10  6in step ups x5 each  07/09/22 Bike L3 x6m44m Recheck goals-- BERG, MMT Standing on foam heel taps 4" STS on airex 2x10 Walking beam Standing tandem on foam 11s R 15s on L Standing feet together of foam, EC 30s  Feet together on  foam ball toss 10 catches Tandem on foam ball toss 5 catches   07/05/22 Bike L2 x 6 min Rows and Lats 15# 2x10 Standing shoulder Extension 5lb 2x10 S2S holding yellow ball 2x10   06/28/22 Bike L2 x6mi16m Rows and Lats 105#  2x10 BlackTB ext 2x10 BlackTB crunches 2x10 Supine stretching HS, IT band, piriformis Bridges with green band abd 2x10 SL clamshells greenTB 2x10 Leg ext 5# 2x10 HS curls 20# 2x10  06/25/22 Bike L2 x27mns  Leg ext 10# 2x10 HS curls 25# 2x10 STS on airex 2x10 Walking beam side steps and narrow stance Shoulder ext 5# x10, 10# x10  Cable rows 5# 2x10  Leg press 20# 2x10  06/22/22 NuStep L5 x649ms LE on Pball bridges, K2C, small bridge x10 each HS, piriformis, and glute stretch 30s each Bridges 2x10 S2S with red ball push out 2x10 Resisted gait 30# 4 x 4 way Rows and Lats 20# 2x10   06/18/22 NuStep L5 x6 min Leg ext 10# 2x10 HS curls 25# 2x10 Rows & Lats 20lb 2x10 Lumbar Ext black band 2x10 S2S OHP yellow wball 2x10 5lb standing shoulder Ext 2x10  06/12/22 NuStep L5 x6m102m  Leg ext 5# 2x10 HS curls 20# 2x10 Rows and lats 15# 2x10 Shoulder ext 5# 2x10 S2S on Airex 2x10- elevated mat table  Walking on beam    PATIENT EDUCATION:  Education details: HEP, hip and core weakness contributing to LBP, fall risk Person educated: Patient Education method: Explanation Education comprehension: verbalized understanding  HOME EXERCISE PROGRAM: Access Code: [email or web address] URL: [email or web address] Date: 05/31/2022 Prepared by: MonAndris Baumannxercises - Supine Bridge  - 1 x daily - 7 x weekly - 2 sets - 10 reps - Clamshell with Resistance  - 1 x daily - 7 x weekly - 2 sets - 10 reps - Standing Hip Abduction with Counter Support  - 1 x daily - 7 x weekly - 2 sets - 10 reps - Standing Hip Extension with Counter Support  - 1 x daily - 7 x weekly - 2 sets - 10 reps  ASSESSMENT:  CLINICAL IMPRESSION: Patient enters feeling some L flank  pain due to a recent fall. Session with a focus on functional LE strengthening and balance. No reports of increase pain during session. She did demo some core weakness with seated OHP. Increase fatigue noted with sit to stand.      OBJECTIVE IMPAIRMENTS: Abnormal gait, decreased balance, difficulty walking, decreased strength, and pain.    PARTICIPATION LIMITATIONS: cleaning, laundry, and yard work  REHBrink's CompanyTENTIAL: Good  CLINICAL DECISION MAKING: Stable/uncomplicated  EVALUATION COMPLEXITY: Low  GOALS: Goals reviewed with patient? No  SHORT TERM GOALS: Target date: 07/12/22  Patient will be independent with initial HEP. Goal status: Met 06/18/22  2.  Patient will be educated on strategies to decrease risk of falls.  Baseline: 5-6 falls in the last 62mo262monthal status: MET   LONG TERM GOALS: Target date: 08/23/22  Patient will be independent with advanced/ongoing HEP to improve outcomes and carryover.  Goal status: Met   2.  Patient will demonstrate decreased fall risk by scoring < 12 sec on TUG. Baseline: 16.31s Goal status: Met 9.17 sec 06/18/22  3.  Patient will demonstrate decreased fall risk by scoring < 15 sec on 5xSTS. Baseline: 24.30s Goal status: Met 06/18/22 14.16 sec  4.  Patient will demonstrate improved functional LE strength as demonstrated by 5/5. Goal status: IN PROGRESS 07/26/22  5.  Patient will score 52 on Berg Balance test to demonstrate lower risk of falls. (MCID= 8 points) .  Baseline: 44, 51/56 Goal status: IN PROGRESS   PLAN:  PT FREQUENCY: 2x/week  PT DURATION: 12 weeks  PLANNED INTERVENTIONS: Therapeutic exercises, Therapeutic activity, Neuromuscular re-education, Balance training, Gait training, Patient/Family education,  Self Care, Joint mobilization, Stair training, Dry Needling, Electrical stimulation, Cryotherapy, Moist heat, Ionotophoresis '4mg'$ /ml Dexamethasone, and Manual therapy.  PLAN FOR NEXT SESSION: LE/hip strengthening, balance  training (step ups on airex, side steps on airex, walking on beam, STS on airex), maybe recheck BERG    Scot Jun, PTA 08/02/2022, 11:07 AM

## 2022-08-06 ENCOUNTER — Telehealth: Payer: Self-pay | Admitting: Internal Medicine

## 2022-08-06 ENCOUNTER — Other Ambulatory Visit: Payer: Self-pay | Admitting: Internal Medicine

## 2022-08-06 NOTE — Therapy (Signed)
OUTPATIENT PHYSICAL THERAPY THORACOLUMBAR TREATMENT     Patient Name: [name] MRN: [id number] DOB:25-May-1941, 82 y.o., female Today's Date: 08/07/2022   PT End of Session - 08/07/22 1101     Visit Number 18    Date for PT Re-Evaluation 08/23/22    PT Start Time 1100    PT Stop Time 1145    PT Time Calculation (min) 45 min    Activity Tolerance Patient tolerated treatment well    Behavior During Therapy WFL for tasks assessed/performed                 Past Medical History:  Diagnosis Date   Anxiety    Arthritis HANDS, NECK , BACK   Asymptomatic carotid artery stenosis LEFT --  MOD. PER DR WILLIS NOTE OCT 2012   Benign heart murmur    Echo 2011 in [place]   Depression    PMH of   Diabetes mellitus ORAL MED   DVT (deep venous thrombosis) ([place]) 2006   after arthroscopy   Frequency of urination    Headache    History of CVA (cerebrovascular accident) NEUROLOGIST  DR WILLIS----  06-01-2010-  LEFT BASAL GANGLIA HEMORRAGE   RESIDUAL RIGHT HAND NUMBNESS/TINGLING   History of laparoscopic cholecystectomy 05/24/2021   Hyperlipidemia    Hypertension    Hypothyroid    MRSA (methicillin resistant staph aureus) culture positive    X3; last post TKR   Nocturia    Numbness and tingling in right hand RESIDUAL FROM CVA NOV 2011   Obesity    Redux therapy   Osteoporosis    Stroke [facility])    Thyroid disease    right thyroid nodule-- BX DONE 08-15-2011   Past Surgical History:  Procedure Laterality Date   ABDOMINAL HYSTERECTOMY  1973   Endometriosis & fibroid   CARPAL TUNNEL RELEASE  2000   RIGHT   CHOLECYSTECTOMY N/A 05/23/2021   Procedure: LAPAROSCOPIC CHOLECYSTECTOMY;  Surgeon: Coralie Keens, MD;  Location: [facility];  Service: General;  Laterality: N/A;   COLONOSCOPY  1999 & 2005   Dr Olevia Perches   cystoscope     to evaluate recurrent UTIs   CYSTOSCOPY W/ RETROGRADES  08/22/2011   Procedure: CYSTOSCOPY WITH RETROGRADE PYELOGRAM;  Surgeon: Molli Hazard, MD;   Location: [facility];  Service: Urology;  Laterality: Bilateral;   CYSTOSCOPY WITH BIOPSY  08/22/2011   Procedure: CYSTOSCOPY WITH BIOPSY;  Surgeon: Molli Hazard, MD;  Location: [facility];  Service: Urology;  Laterality: N/A;   g2 p2     JOINT REPLACEMENT  03-28-2006   LEFT THUMB   KNEE ARTHROSCOPY     BILATERAL PRIOR TO TOTAL KNEE   LEFT SHOULDER SURG  1990'S   LUMBAR LAMINECTOMY  08-26-2009   L 4 - 5   THYROID NODULE BX  08-15-2011   RIGHT   TOTAL KNEE ARTHROPLASTY  05-26-2002      LEFT   AND RIGHT  03-30-2098   WRIST SURGERY  2014   tendon placed in joint; Dr Burney Gauze   Patient Active Problem List   Diagnosis Date Noted   Sciatica of right side 03/27/2022   Calculus of gallbladder with acute cholecystitis without obstruction 05/17/2021   Tingling 03/16/2021   Vitamin B12 deficiency 03/16/2021   Head injury due to trauma 02/23/2021   Low back pain 10/29/2019   Pain in left wrist 10/01/2019   Cervicalgia 07/29/2019   Trigger finger, left middle finger 04/28/2018   Hypothyroidism 03/29/2017  Fecal incontinence 09/25/2016   Generalized anxiety disorder 06/20/2015   SUI (stress urinary incontinence, female) 02/05/2014   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   Cerebral artery occlusion with cerebral infarction ([place]) 05/31/2010   Diabetes ([place]) 01/24/2010   THYROID NODULE, RIGHT 01/21/2009   Vitamin D deficiency 12/10/2007   HYPERLIPIDEMIA 06/17/2007   Essential hypertension 06/17/2007   Osteopenia 06/17/2007   Osteoarthrosis, unspecified whether generalized or localized, unspecified site 11/11/2006    PCP: Billey Gosling  REFERRING PROVIDER: Earnie Larsson  REFERRING DIAG: M54.16  Rationale for Evaluation and Treatment: Rehabilitation  THERAPY DIAG:  Muscle weakness (generalized)  Other abnormalities of gait and mobility  Repeated falls  Other low back pain  ONSET DATE: "about a year ago"  SUBJECTIVE:                                                                                                                                                                                            SUBJECTIVE STATEMENT: I am okay, not really having any pain.    PERTINENT HISTORY:  Lumbar laminectomy L4-5  PAIN:  Are you having pain? 3/10 L Rib area and side  PRECAUTIONS: None  WEIGHT BEARING RESTRICTIONS: No  FALLS:  Has patient fallen in last 6 months? Yes. Number of falls 5-6  LIVING ENVIRONMENT: Lives with: lives with their daughter Lives in: House/apartment Stairs: No Has following equipment at home: Single point cane and Environmental consultant - 2 wheeled  OCCUPATION: Retired  PLOF: Independent  PATIENT GOALS: not to fall, get my balance better   NEXT MD VISIT:   OBJECTIVE:   SCREENING FOR RED FLAGS: Bowel or bladder incontinence: No Spinal tumors: No Cauda equina syndrome: No Compression fracture: No Abdominal aneurysm: No  COGNITION: Overall cognitive status: Within functional limits for tasks assessed     SENSATION: WFL  MUSCLE LENGTH: Hamstrings: tightness BLE  POSTURE: rounded shoulders and forward head   LUMBAR ROM:   AROM eval  Flexion Mid shin  Extension 50% w/pain  Right lateral flexion Pain at end range  Left lateral flexion Pain at end range  Right rotation Pain at end range  Left rotation Pain at end range   (Blank rows = not tested)  LOWER EXTREMITY ROM:   grossly [facility]    LOWER EXTREMITY MMT:    MMT Right eval Left eval Right 07/09/22 Left 07/09/22   Hip flexion 3+ 3+ 4+ 4+ 4+  Hip extension 3+ 3+ 4 4 4+  Hip abduction 3+ 3+ '4 4 4  '$ Hip adduction       Hip internal rotation       Hip external rotation  Knee flexion '4 4 5 5   '$ Knee extension '4 4 5 5   '$ Ankle dorsiflexion       Ankle plantarflexion       Ankle inversion       Ankle eversion        (Blank rows = not tested)  LUMBAR SPECIAL TESTS:  Straight leg raise test: Positive  FUNCTIONAL TESTS:  5 times  sit to stand: 24.30s Timed up and go (TUG): 16.13  GAIT: Distance walked: in clinic  Assistive device utilized: None Level of assistance: Modified independence Comments: poor foot clearance, trendelenberg, decreased step length, antalgic gait, slowed speed   TODAY'S TREATMENT:                                                                                                                              DATE:  08/07/22 NuStep L5 x44mns  Step ups 6"  Leg ext 10# 2x10 HS curls 20# 2x10 S2S 2x10 holding yellow ball on airex  Leg press 20# 2x10 Walking on beam   08/02/22 NuStep L5 x 6 min Seated OHP yellow ball 2x10 S2S holding yellow ball 2x10  Seated Rows & Lats 20lb 2x10  Hamstring curls 20lb 3x10 Leg Ext 10lb 2x10 Negotiating over objects forward and side steps   07/31/22 Bike L4 x644ms  Leg ext 10# 2x10 HS curls 25# 2x10 Leg press 20# 2x10 Resisted gait 30# 4 x 4way  Step up 4" on airex  Side step ups on BOSU   07/26/22 Bike L3 x 6 min Leg press 30lb 2x10  Hamstring curls 25lb 2x12 Leg extension 10lb 2x10 Shoulder Ext 10lb 2x10 S2S OHP yellow ball 2x10 Step ups 4 in box on airex x10  07/24/21 Bike L3 x 6 min  Leg press 30lb 2x12 Rows 25lb and lats 20lb 2x10 Hamstring curls 20lb x12, 25lb x12 Leg Ext 10lb 2x12 Resisted sides step 30lb x5 each  07/19/22 NuStep L 5 x 6 min Rows and lats 20# 2x10 Shoulder Ext 10lb x10, 5lb x10 LE & Lumbar stretches w/ MHP to lumbar spine ~13 min   NuStep L5 x5m64m HS curls 25# 2x10 Leg ext 10# 2x10 Standing on airex rows and ext green 2x10 Rows and lats 20# 2x10 Leg press 20# 2x10  07/11/22 NuStep L5 x 6 min S2S on airex OHP yellow wball 2x10 Resisted gait 30lb all directions x3 each Hamstring curls 25lb 2x10  Leg Ext 10lb 2x10  6in step ups x5 each  07/09/22 Bike L3 x6mi58mRecheck goals-- BERG, MMT Standing on foam heel taps 4" STS on airex 2x10 Walking beam Standing tandem on foam 11s R 15s on L Standing feet  together of foam, EC 30s  Feet together on foam ball toss 10 catches Tandem on foam ball toss 5 catches   07/05/22 Bike L2 x 6 min Rows and Lats 15# 2x10 Standing shoulder Extension 5lb 2x10 S2S holding yellow ball 2x10   06/28/22 Bike L2  x11mns  Rows and Lats 105# 2x10 BlackTB ext 2x10 BlackTB crunches 2x10 Supine stretching HS, IT band, piriformis Bridges with green band abd 2x10 SL clamshells greenTB 2x10 Leg ext 5# 2x10 HS curls 20# 2x10  06/25/22 Bike L2 x678ms  Leg ext 10# 2x10 HS curls 25# 2x10 STS on airex 2x10 Walking beam side steps and narrow stance Shoulder ext 5# x10, 10# x10  Cable rows 5# 2x10  Leg press 20# 2x10  06/22/22 NuStep L5 x6m46m LE on Pball bridges, K2C, small bridge x10 each HS, piriformis, and glute stretch 30s each Bridges 2x10 S2S with red ball push out 2x10 Resisted gait 30# 4 x 4 way Rows and Lats 20# 2x10   06/18/22 NuStep L5 x6 min Leg ext 10# 2x10 HS curls 25# 2x10 Rows & Lats 20lb 2x10 Lumbar Ext black band 2x10 S2S OHP yellow wball 2x10 5lb standing shoulder Ext 2x10  06/12/22 NuStep L5 x6mi60m Leg ext 5# 2x10 HS curls 20# 2x10 Rows and lats 15# 2x10 Shoulder ext 5# 2x10 S2S on Airex 2x10- elevated mat table  Walking on beam    PATIENT EDUCATION:  Education details: HEP, hip and core weakness contributing to LBP, fall risk Person educated: Patient Education method: Explanation Education comprehension: verbalized understanding  HOME EXERCISE PROGRAM: Access Code: [email or web address] URL: [email or web address] Date: 05/31/2022 Prepared by: MonaAndris Baumannercises - Supine Bridge  - 1 x daily - 7 x weekly - 2 sets - 10 reps - Clamshell with Resistance  - 1 x daily - 7 x weekly - 2 sets - 10 reps - Standing Hip Abduction with Counter Support  - 1 x daily - 7 x weekly - 2 sets - 10 reps - Standing Hip Extension with Counter Support  - 1 x daily - 7 x weekly - 2 sets - 10 reps  ASSESSMENT:  CLINICAL  IMPRESSION: Session with a focus on functional LE strengthening and balance. Walking on beam tandem is still hard for her to do, has some dizziness with this and needed a seated rest break. No reports of increase pain during session.   OBJECTIVE IMPAIRMENTS: Abnormal gait, decreased balance, difficulty walking, decreased strength, and pain.    PARTICIPATION LIMITATIONS: cleaning, laundry, and yard work  REHABrink's CompanyENTIAL: Good  CLINICAL DECISION MAKING: Stable/uncomplicated  EVALUATION COMPLEXITY: Low  GOALS: Goals reviewed with patient? No  SHORT TERM GOALS: Target date: 07/12/22  Patient will be independent with initial HEP. Goal status: Met 06/18/22  2.  Patient will be educated on strategies to decrease risk of falls.  Baseline: 5-6 falls in the last 6mon92monthl status: MET   LONG TERM GOALS: Target date: 08/23/22  Patient will be independent with advanced/ongoing HEP to improve outcomes and carryover.  Goal status: Met   2.  Patient will demonstrate decreased fall risk by scoring < 12 sec on TUG. Baseline: 16.31s Goal status: Met 9.17 sec 06/18/22  3.  Patient will demonstrate decreased fall risk by scoring < 15 sec on 5xSTS. Baseline: 24.30s Goal status: Met 06/18/22 14.16 sec  4.  Patient will demonstrate improved functional LE strength as demonstrated by 5/5. Goal status: IN PROGRESS 07/26/22  5.  Patient will score 52 on Berg Balance test to demonstrate lower risk of falls. (MCID= 8 points) .  Baseline: 44, 51/56 Goal status: IN PROGRESS   PLAN:  PT FREQUENCY: 2x/week  PT DURATION: 12 weeks  PLANNED INTERVENTIONS: Therapeutic exercises, Therapeutic activity, Neuromuscular re-education, Balance training, Gait training, Patient/Family education, Self Care, Joint  mobilization, Stair training, Dry Needling, Electrical stimulation, Cryotherapy, Moist heat, Ionotophoresis '4mg'$ /ml Dexamethasone, and Manual therapy.  PLAN FOR NEXT SESSION: LE/hip strengthening,  balance training (step ups on airex, side steps on airex, walking on beam, STS on airex), maybe recheck [facility], PT 08/07/2022, 11:42 AM

## 2022-08-06 NOTE — Telephone Encounter (Signed)
Patient called requesting a refill on her medication diazepam (VALIUM) 5 MG tablet. Patient stated that she only has 3 pills left. Patient is requesting for medication to be sent to her [place], [place] AT [place]. Best callback number for patient is [phone].

## 2022-08-07 ENCOUNTER — Ambulatory Visit: Payer: Medicare Other

## 2022-08-07 ENCOUNTER — Other Ambulatory Visit: Payer: Self-pay

## 2022-08-07 DIAGNOSIS — R296 Repeated falls: Secondary | ICD-10-CM | POA: Diagnosis not present

## 2022-08-07 DIAGNOSIS — M5459 Other low back pain: Secondary | ICD-10-CM | POA: Diagnosis not present

## 2022-08-07 DIAGNOSIS — R2689 Other abnormalities of gait and mobility: Secondary | ICD-10-CM | POA: Diagnosis not present

## 2022-08-07 DIAGNOSIS — M6281 Muscle weakness (generalized): Secondary | ICD-10-CM | POA: Diagnosis not present

## 2022-08-07 MED ORDER — DIAZEPAM 5 MG PO TABS: 5.0000 mg | ORAL_TABLET | Freq: Every day | ORAL | 0 refills | Status: DC

## 2022-08-07 NOTE — Telephone Encounter (Signed)
Spoke to patient today

## 2022-08-07 NOTE — Telephone Encounter (Signed)
10 day supply sent to [facility].  [facility] day [facility] to [facility].

## 2022-08-08 DIAGNOSIS — M5416 Radiculopathy, lumbar region: Secondary | ICD-10-CM | POA: Diagnosis not present

## 2022-08-08 NOTE — Therapy (Signed)
OUTPATIENT PHYSICAL THERAPY THORACOLUMBAR TREATMENT     Patient Name: [name] MRN: [id number] DOB:[birth year], 82 y.o., female Today's Date: 08/09/2022   PT End of Session - 08/09/22 1104     Visit Number 19    Date for PT Re-Evaluation 08/23/22    PT Start Time 1100    PT Stop Time 1145    PT Time Calculation (min) 45 min    Activity Tolerance Patient tolerated treatment well    Behavior During Therapy WFL for tasks assessed/performed                  Past Medical History:  Diagnosis Date   Anxiety    Arthritis HANDS, NECK , BACK   Asymptomatic carotid artery stenosis LEFT --  MOD. PER DR WILLIS NOTE OCT 2012   Benign heart murmur    Echo 2011 in [place]   Depression    PMH of   Diabetes mellitus [facility]   DVT (deep venous thrombosis) ([place]) 2006   after arthroscopy   Frequency of urination    Headache    History of CVA (cerebrovascular accident) NEUROLOGIST  DR WILLIS----  06-01-2010-  LEFT BASAL GANGLIA HEMORRAGE   RESIDUAL RIGHT HAND NUMBNESS/TINGLING   History of laparoscopic cholecystectomy 05/24/2021   Hyperlipidemia    Hypertension    Hypothyroid    MRSA (methicillin resistant staph aureus) culture positive    X3; last post TKR   Nocturia    Numbness and tingling in right hand RESIDUAL FROM CVA NOV 2011   Obesity    Redux therapy   Osteoporosis    Stroke [facility])    Thyroid disease    right thyroid nodule-- BX DONE 08-15-2011   Past Surgical History:  Procedure Laterality Date   ABDOMINAL HYSTERECTOMY  1973   Endometriosis & fibroid   CARPAL TUNNEL RELEASE  2000   RIGHT   CHOLECYSTECTOMY N/A 05/23/2021   Procedure: LAPAROSCOPIC CHOLECYSTECTOMY;  Surgeon: Coralie Keens, MD;  Location: [facility];  Service: General;  Laterality: N/A;   COLONOSCOPY  1999 & 2005   Dr Olevia Perches   cystoscope     to evaluate recurrent UTIs   CYSTOSCOPY W/ RETROGRADES  08/22/2011   Procedure: CYSTOSCOPY WITH RETROGRADE PYELOGRAM;  Surgeon: Molli Hazard, MD;   Location[facility];  Service: Urology;  Laterality: Bilateral;   CYSTOSCOPY WITH BIOPSY  08/22/2011   Procedure: CYSTOSCOPY WITH BIOPSY;  Surgeon: Molli Hazard, MD;  Location: [facility];  Service: Urology;  Laterality: N/A;   g2 p2     JOINT REPLACEMENT  03-28-2006   LEFT THUMB   KNEE ARTHROSCOPY     BILATERAL PRIOR TO TOTAL KNEE   LEFT SHOULDER SURG  1990'S   LUMBAR LAMINECTOMY  08-26-2009   L 4 - 5   THYROID NODULE BX  08-15-2011   RIGHT   TOTAL KNEE ARTHROPLASTY  05-26-2002      LEFT   AND RIGHT  03-30-2098   WRIST SURGERY  2014   tendon placed in joint; Dr Burney Gauze   Patient Active Problem List   Diagnosis Date Noted   Sciatica of right side 03/27/2022   Calculus of gallbladder with acute cholecystitis without obstruction 05/17/2021   Tingling 03/16/2021   Vitamin B12 deficiency 03/16/2021   Head injury due to trauma 02/23/2021   Low back pain 10/29/2019   Pain in left wrist 10/01/2019   Cervicalgia 07/29/2019   Trigger finger, left middle finger 04/28/2018   Hypothyroidism  03/29/2017   Fecal incontinence 09/25/2016   Generalized anxiety disorder 06/20/2015   SUI (stress urinary incontinence, female) 02/05/2014   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   Cerebral artery occlusion with cerebral infarction ([place]) 05/31/2010   Diabetes ([place]) 01/24/2010   THYROID NODULE, RIGHT 01/21/2009   Vitamin D deficiency 12/10/2007   HYPERLIPIDEMIA 06/17/2007   Essential hypertension 06/17/2007   Osteopenia 06/17/2007   Osteoarthrosis, unspecified whether generalized or localized, unspecified site 11/11/2006    PCP: Billey Gosling  REFERRING PROVIDER: Earnie Larsson  REFERRING DIAG: M54.16  Rationale for Evaluation and Treatment: Rehabilitation  THERAPY DIAG:  Muscle weakness (generalized)  Other abnormalities of gait and mobility  Repeated falls  ONSET DATE: "about a year ago"  SUBJECTIVE:                                                                                                                                                                                            SUBJECTIVE STATEMENT: I saw the neurosurgeon and he thinks I am able to do exercising on my own. I think I will be ready by the end of the month.    PERTINENT HISTORY:  Lumbar laminectomy L4-5  PAIN:  Are you having pain? 3/10 L Rib area and side  PRECAUTIONS: None  WEIGHT BEARING RESTRICTIONS: No  FALLS:  Has patient fallen in last 6 months? Yes. Number of falls 5-6  LIVING ENVIRONMENT: Lives with: lives with their daughter Lives in: House/apartment Stairs: No Has following equipment at home: Single point cane and Environmental consultant - 2 wheeled  OCCUPATION: Retired  PLOF: Independent  PATIENT GOALS: not to fall, get my balance better   NEXT MD VISIT:   OBJECTIVE:   SCREENING FOR RED FLAGS: Bowel or bladder incontinence: No Spinal tumors: No Cauda equina syndrome: No Compression fracture: No Abdominal aneurysm: No  COGNITION: Overall cognitive status: Within functional limits for tasks assessed     SENSATION: WFL  MUSCLE LENGTH: Hamstrings: tightness BLE  POSTURE: rounded shoulders and forward head   LUMBAR ROM:   AROM eval  Flexion Mid shin  Extension 50% w/pain  Right lateral flexion Pain at end range  Left lateral flexion Pain at end range  Right rotation Pain at end range  Left rotation Pain at end range   (Blank rows = not tested)  LOWER EXTREMITY ROM:   grossly [facility]    LOWER EXTREMITY MMT:    MMT Right eval Left eval Right 07/09/22 Left 07/09/22   Hip flexion 3+ 3+ 4+ 4+ 4+  Hip extension 3+ 3+ 4 4 4+  Hip abduction 3+ 3+ '4 4 4  '$ Hip adduction  Hip internal rotation       Hip external rotation       Knee flexion '4 4 5 5   '$ Knee extension '4 4 5 5   '$ Ankle dorsiflexion       Ankle plantarflexion       Ankle inversion       Ankle eversion        (Blank rows = not tested)  LUMBAR SPECIAL TESTS:   Straight leg raise test: Positive  FUNCTIONAL TESTS:  5 times sit to stand: 24.30s Timed up and go (TUG): 16.13  GAIT: Distance walked: in clinic  Assistive device utilized: None Level of assistance: Modified independence Comments: poor foot clearance, trendelenberg, decreased step length, antalgic gait, slowed speed   TODAY'S TREATMENT:                                                                                                                              DATE:  08/09/22 Bike L4 x51mns Step up 4" on top of airex Resisted gait 30# 4 way x4 Standing on airex cone taps  Stepping over obstacles with 3#  Rows and ext standing on airex 2x12 green Leg press 40# 2x10  08/07/22 NuStep L5 x656ms  Step ups 6"  Leg ext 10# 2x10 HS curls 20# 2x10 S2S 2x10 holding yellow ball on airex  Leg press 20# 2x10 Walking on beam   08/02/22 NuStep L5 x 6 min Seated OHP yellow ball 2x10 S2S holding yellow ball 2x10  Seated Rows & Lats 20lb 2x10  Hamstring curls 20lb 3x10 Leg Ext 10lb 2x10 Negotiating over objects forward and side steps   07/31/22 Bike L4 x6m38m  Leg ext 10# 2x10 HS curls 25# 2x10 Leg press 20# 2x10 Resisted gait 30# 4 x 4way  Step up 4" on airex  Side step ups on BOSU    PATIENT EDUCATION:  Education details: HEP, hip and core weakness contributing to LBP, fall risk Person educated: Patient Education method: Explanation Education comprehension: verbalized understanding  HOME EXERCISE PROGRAM: Access Code: [email or web address] URL: [email or web address] Date: 05/31/2022 Prepared by: MonAndris Baumannxercises - Supine Bridge  - 1 x daily - 7 x weekly - 2 sets - 10 reps - Clamshell with Resistance  - 1 x daily - 7 x weekly - 2 sets - 10 reps - Standing Hip Abduction with Counter Support  - 1 x daily - 7 x weekly - 2 sets - 10 reps - Standing Hip Extension with Counter Support  - 1 x daily - 7 x weekly - 2 sets - 10 reps  ASSESSMENT:  CLINICAL  IMPRESSION: Patient reports she has noticed her walking has gotten better. Some difficulty with cone taps on airex, loses balance a few time but able to correct with falling. Fatigue noted by end of session. Continue to work on balance and coordination.   OBJECTIVE IMPAIRMENTS: Abnormal gait, decreased balance, difficulty walking, decreased strength, and pain.  PARTICIPATION LIMITATIONS: cleaning, laundry, and yard work  [employer] POTENTIAL: Good  CLINICAL DECISION MAKING: Stable/uncomplicated  EVALUATION COMPLEXITY: Low  GOALS: Goals reviewed with patient? No  SHORT TERM GOALS: Target date: 07/12/22  Patient will be independent with initial HEP. Goal status: Met 06/18/22  2.  Patient will be educated on strategies to decrease risk of falls.  Baseline: 5-6 falls in the last 4month Goal status: MET   LONG TERM GOALS: Target date: 08/23/22  Patient will be independent with advanced/ongoing HEP to improve outcomes and carryover.  Goal status: Met   2.  Patient will demonstrate decreased fall risk by scoring < 12 sec on TUG. Baseline: 16.31s Goal status: Met 9.17 sec 06/18/22  3.  Patient will demonstrate decreased fall risk by scoring < 15 sec on 5xSTS. Baseline: 24.30s Goal status: Met 06/18/22 14.16 sec  4.  Patient will demonstrate improved functional LE strength as demonstrated by 5/5. Goal status: IN PROGRESS 07/26/22  5.  Patient will score 52 on Berg Balance test to demonstrate lower risk of falls. (MCID= 8 points) .  Baseline: 44, 51/56 Goal status: IN PROGRESS   PLAN:  PT FREQUENCY: 2x/week  PT DURATION: 12 weeks  PLANNED INTERVENTIONS: Therapeutic exercises, Therapeutic activity, Neuromuscular re-education, Balance training, Gait training, Patient/Family education, Self Care, Joint mobilization, Stair training, Dry Needling, Electrical stimulation, Cryotherapy, Moist heat, Ionotophoresis '4mg'$ /ml Dexamethasone, and Manual therapy.  PLAN FOR NEXT SESSION: LE/hip  strengthening, balance training (step ups on airex, side steps on airex, walking on beam, STS on airex), [facility] 08/09/2022, 11:47 AM

## 2022-08-09 ENCOUNTER — Encounter: Payer: Self-pay | Admitting: Internal Medicine

## 2022-08-09 ENCOUNTER — Ambulatory Visit: Payer: Medicare Other

## 2022-08-09 DIAGNOSIS — R296 Repeated falls: Secondary | ICD-10-CM

## 2022-08-09 DIAGNOSIS — M6281 Muscle weakness (generalized): Secondary | ICD-10-CM

## 2022-08-09 DIAGNOSIS — R2689 Other abnormalities of gait and mobility: Secondary | ICD-10-CM

## 2022-08-09 DIAGNOSIS — K5900 Constipation, unspecified: Secondary | ICD-10-CM | POA: Insufficient documentation

## 2022-08-09 DIAGNOSIS — M5459 Other low back pain: Secondary | ICD-10-CM | POA: Diagnosis not present

## 2022-08-09 NOTE — Patient Instructions (Addendum)
     Have an xray downstairs.   Medications changes include :   take the stool softener daily ( colace).  Start prunes.  Drink lots of water.  Other options for prevention is benefiber, probiotics, magnesium.     Miralax twice daily until you have a good bowel movement  OR  Try dulcolax pill ( laxative) once  If above not effective try the dulcolax suppository.  If that does not work do a Merchandiser, retail.      Return if symptoms worsen or fail to improve.    Constipation, Adult Constipation is when a person has trouble pooping (having a bowel movement). When you have this condition, you may poop fewer than 3 times a week. Your poop (stool) may also be dry, hard, or bigger than normal. Follow these instructions at home: Eating and drinking  Eat foods that have a lot of fiber, such as: Fresh fruits and vegetables. Whole grains. Beans. Eat less of foods that are low in fiber and high in fat and sugar, such as: [place] fries. Hamburgers. Cookies. Candy. Soda. Drink enough fluid to keep your pee (urine) pale yellow. General instructions Exercise regularly or as told by your doctor. Try to do 150 minutes of exercise each week. Go to the restroom when you feel like you need to poop. Do not hold it in. Take over-the-counter and prescription medicines only as told by your doctor. These include any fiber supplements. When you poop: Do deep breathing while relaxing your lower belly (abdomen). Relax your pelvic floor. The pelvic floor is a group of muscles that support the rectum, bladder, and intestines (as well as the uterus in women). Watch your condition for any changes. Tell your doctor if you notice any. Keep all follow-up visits as told by your doctor. This is important. Contact a doctor if: You have pain that gets worse. You have a fever. You have not pooped for 4 days. You vomit. You are not hungry. You lose weight. You are bleeding from the opening of the butt  (anus). You have thin, pencil-like poop. Get help right away if: You have a fever, and your symptoms suddenly get worse. You leak poop or have blood in your poop. Your belly feels hard or bigger than normal (bloated). You have very bad belly pain. You feel dizzy or you faint. Summary Constipation is when a person poops fewer than 3 times a week, has trouble pooping, or has poop that is dry, hard, or bigger than normal. Eat foods that have a lot of fiber. Drink enough fluid to keep your pee (urine) pale yellow. Take over-the-counter and prescription medicines only as told by your doctor. These include any fiber supplements. This information is not intended to replace advice given to you by your health care provider. Make sure you discuss any questions you have with your health care provider. Document Revised: 05/27/2019 Document Reviewed: 05/27/2019 Elsevier Patient Education  [place].

## 2022-08-09 NOTE — Progress Notes (Signed)
Subjective:    Patient ID: [name], female    DOB: 21-Jul-1941, 82 y.o.   MRN: [id number]      HPI [name] is here for  Chief Complaint  Patient presents with   Constipation    Constipation (soft bowel movements); Feels like she has to go but sometimes she only goes alittle bit    Constipation -this has been a problem for the past year.  Was taking a stool softener as needed and that would work but it has stopped working.  She has had more significant constipation over the past month, but this week she has not had a bowel movement.  She has been leaking some soft stool when she urinates.  She can sometimes get some tiny pieces of stool to pass when she strains.  She is passing gas.    Over the past year was taking stool softener. Took miralax a couple of times and it did not work.     Medications and allergies reviewed with patient and updated if appropriate.  Current Outpatient Medications on File Prior to Visit  Medication Sig Dispense Refill   aspirin 81 MG chewable tablet Chew 81 mg by mouth at bedtime.     aspirin-acetaminophen-caffeine (EXCEDRIN MIGRAINE) 250-250-65 MG tablet Take 1 tablet by mouth 2 (two) times daily as needed for headache.     benazepril (LOTENSIN) 20 MG tablet TAKE 1 TABLET DAILY 90 tablet 3   blood glucose meter kit and supplies KIT Dispense based on patient and insurance preference. Use up to four times daily as directed. (FOR E11.9). 1 each 0   Calcium Citrate-Vitamin D (CALCIUM CITRATE + D PO) Take 1 tablet by mouth at bedtime.     Cholecalciferol (VITAMIN D3) 50 MCG (2000 UT) TABS Take 2,000 Units by mouth at bedtime.     Cyanocobalamin (VITAMIN B-12) 5000 MCG TBDP Take 10,000 mcg by mouth in the morning.     diazepam (VALIUM) 5 MG tablet Take 1 tablet (5 mg total) by mouth at bedtime. 10 tablet 0   Dulaglutide (TRULICITY) 1.5 EZ/6.6QH SOPN Inject 1.5 mg into the skin once a week. 6 mL 3   escitalopram (LEXAPRO) 10 MG tablet TAKE 1 TABLET DAILY  90 tablet 3   estradiol (ESTRACE) 0.1 MG/GM vaginal cream Place 1 Applicatorful vaginally every three (3) days as needed (vaginal dryness).     gabapentin (NEURONTIN) 300 MG capsule Take by mouth.     levothyroxine (SYNTHROID) 50 MCG tablet Take 1 tablet (50 mcg total) by mouth daily. 90 tablet 3   metFORMIN (GLUCOPHAGE-XR) 500 MG 24 hr tablet TAKE 2 TABLETS WITH BREAKFAST AND 1 TABLET WITH DINNER 270 tablet 3   methylcellulose (ARTIFICIAL TEARS) 1 % ophthalmic solution Place 1 drop into both eyes daily as needed (itching eyes).      Multiple Vitamin (MULTIVITAMIN WITH MINERALS) TABS tablet Take 1 tablet by mouth daily. One A Day for Women     nitrofurantoin (MACRODANTIN) 50 MG capsule Take 50 mg by mouth at bedtime.     polyethylene glycol (MIRALAX / GLYCOLAX) 17 g packet Take 17 g by mouth daily as needed for moderate constipation or severe constipation.  0   pravastatin (PRAVACHOL) 40 MG tablet TAKE 1 TABLET AT BEDTIME 90 tablet 3   docusate sodium (COLACE) 100 MG capsule Take 1 capsule (100 mg total) by mouth 2 (two) times daily as needed for mild constipation. (Patient not taking: Reported on 08/10/2022)     No  current facility-administered medications on file prior to visit.    Review of Systems  Constitutional:  Negative for fever.  Gastrointestinal:  Positive for abdominal distention (bloated and uncomfortable) and constipation. Negative for abdominal pain, blood in stool and nausea.       Some gerd  Genitourinary:  Positive for frequency. Negative for dysuria and hematuria.       Objective:   Vitals:   08/10/22 1054  BP: 134/78  Pulse: 65  Temp: 97.9 F (36.6 C)  SpO2: 97%   BP Readings from Last 3 Encounters:  08/10/22 134/78  03/27/22 126/76  09/21/21 124/76   Wt Readings from Last 3 Encounters:  08/10/22 160 lb (72.6 kg)  03/27/22 162 lb 12.8 oz (73.8 kg)  09/21/21 159 lb 9.6 oz (72.4 kg)   Body mass index is 25.82 kg/m.    Physical Exam Constitutional:       General: She is not in acute distress.    Appearance: Normal appearance. She is not ill-appearing.  HENT:     Head: Normocephalic and atraumatic.  Abdominal:     General: There is no distension.     Palpations: Abdomen is soft. There is no mass.     Tenderness: There is abdominal tenderness (mild generalized tenderness). There is no guarding or rebound.  Skin:    General: Skin is warm and dry.  Neurological:     Mental Status: She is alert.            Assessment & Plan:    See Problem List for Assessment and Plan of chronic medical problems.

## 2022-08-10 ENCOUNTER — Ambulatory Visit (INDEPENDENT_AMBULATORY_CARE_PROVIDER_SITE_OTHER): Payer: Medicare Other | Admitting: Internal Medicine

## 2022-08-10 ENCOUNTER — Ambulatory Visit (INDEPENDENT_AMBULATORY_CARE_PROVIDER_SITE_OTHER): Payer: Medicare Other

## 2022-08-10 VITALS — BP 134/78 | HR 65 | Temp 97.9°F | Ht 66.0 in | Wt 160.0 lb

## 2022-08-10 DIAGNOSIS — K5900 Constipation, unspecified: Secondary | ICD-10-CM

## 2022-08-10 DIAGNOSIS — I10 Essential (primary) hypertension: Secondary | ICD-10-CM

## 2022-08-10 MED ORDER — BISACODYL 5 MG PO TBEC: 5.0000 mg | DELAYED_RELEASE_TABLET | Freq: Every day | ORAL | 0 refills | Status: DC | PRN

## 2022-08-10 MED ORDER — BISACODYL 10 MG RE SUPP: 10.0000 mg | RECTAL | 0 refills | Status: DC | PRN

## 2022-08-10 NOTE — Assessment & Plan Note (Signed)
Acute on chronic Severe now for the past week-has chronic constipation but is also on Trulicity which is likely contributing Not likely obstructed given lack of pain and passing gas and some stool Discussed prevention with stool softener, prunes, water, fiber Now can try miralax twice daily until a BM or dulcolax 5 mg x 1 If not effective can try other -- then try bisacodyl 10 mg rectal suppository If above not effective try Fleet enema Call with questions or concerns

## 2022-08-10 NOTE — Assessment & Plan Note (Signed)
Chronic Blood pressure well-controlled Continue benazepril 20 mg daily

## 2022-08-13 NOTE — Therapy (Signed)
OUTPATIENT PHYSICAL THERAPY THORACOLUMBAR TREATMENT   Progress Note Reporting Period 07/11/22 to 08/14/22  See note below for Objective Data and Assessment of Progress/Goals.      Patient Name: [name] MRN: [id number] DOB:[birth year], 82 y.o., female Today's Date: 08/14/2022   PT End of Session - 08/14/22 1058     Visit Number 20    Date for PT Re-Evaluation 08/23/22    PT Start Time 1100    PT Stop Time 1145    PT Time Calculation (min) 45 min    Activity Tolerance Patient tolerated treatment well    Behavior During Therapy WFL for tasks assessed/performed                  Past Medical History:  Diagnosis Date   Anxiety    Arthritis HANDS, NECK , BACK   Asymptomatic carotid artery stenosis LEFT --  MOD. PER DR WILLIS NOTE OCT 2012   Benign heart murmur    Echo 2011 in [place]   Depression    PMH of   Diabetes mellitus ORAL MED   DVT (deep venous thrombosis) ([place]) 2006   after arthroscopy   Frequency of urination    Headache    History of CVA (cerebrovascular accident) NEUROLOGIST  DR WILLIS----  06-01-2010-  LEFT BASAL GANGLIA HEMORRAGE   RESIDUAL RIGHT HAND NUMBNESS/TINGLING   History of laparoscopic cholecystectomy 05/24/2021   Hyperlipidemia    Hypertension    Hypothyroid    MRSA (methicillin resistant staph aureus) culture positive    X3; last post TKR   Nocturia    Numbness and tingling in right hand RESIDUAL FROM CVA NOV 2011   Obesity    Redux therapy   Osteoporosis    Stroke [facility])    Thyroid disease    right thyroid nodule-- BX DONE 08-15-2011   Past Surgical History:  Procedure Laterality Date   ABDOMINAL HYSTERECTOMY  1973   Endometriosis & fibroid   CARPAL TUNNEL RELEASE  2000   RIGHT   CHOLECYSTECTOMY N/A 05/23/2021   Procedure: LAPAROSCOPIC CHOLECYSTECTOMY;  Surgeon: Coralie Keens, MD;  Location: [facility];  Service: General;  Laterality: N/A;   COLONOSCOPY  1999 & 2005   Dr Olevia Perches   cystoscope     to evaluate recurrent UTIs    CYSTOSCOPY W/ RETROGRADES  08/22/2011   Procedure: CYSTOSCOPY WITH RETROGRADE PYELOGRAM;  Surgeon: Molli Hazard, MD;  [facility];  Service: Urology;  Laterality: Bilateral;   CYSTOSCOPY WITH BIOPSY  08/22/2011   Procedure: CYSTOSCOPY WITH BIOPSY;  Surgeon: Molli Hazard, MD;  Location: [facility];  Service: Urology;  Laterality: N/A;   g2 p2     JOINT REPLACEMENT  03-28-2006   LEFT THUMB   KNEE ARTHROSCOPY     BILATERAL PRIOR TO TOTAL KNEE   LEFT SHOULDER SURG  1990'S   LUMBAR LAMINECTOMY  08-26-2009   L 4 - 5   THYROID NODULE BX  08-15-2011   RIGHT   TOTAL KNEE ARTHROPLASTY  05-26-2002      LEFT   AND RIGHT  03-30-2098   WRIST SURGERY  2014   tendon placed in joint; Dr Burney Gauze   Patient Active Problem List   Diagnosis Date Noted   Constipation 08/09/2022   Sciatica of right side 03/27/2022   Calculus of gallbladder with acute cholecystitis without obstruction 05/17/2021   Tingling 03/16/2021   Vitamin B12 deficiency 03/16/2021   Head injury due to trauma 02/23/2021   Low  back pain 10/29/2019   Pain in left wrist 10/01/2019   Cervicalgia 07/29/2019   Trigger finger, left middle finger 04/28/2018   Hypothyroidism 03/29/2017   Fecal incontinence 09/25/2016   Generalized anxiety disorder 06/20/2015   SUI (stress urinary incontinence, female) 02/05/2014   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   Cerebral artery occlusion with cerebral infarction ([place]) 05/31/2010   Diabetes ([place]) 01/24/2010   THYROID NODULE, RIGHT 01/21/2009   Vitamin D deficiency 12/10/2007   HYPERLIPIDEMIA 06/17/2007   Essential hypertension 06/17/2007   Osteopenia 06/17/2007   Osteoarthrosis, unspecified whether generalized or localized, unspecified site 11/11/2006    PCP: Billey Gosling  REFERRING PROVIDER: Earnie Larsson  REFERRING DIAG: M54.16  Rationale for Evaluation and Treatment: Rehabilitation  THERAPY DIAG:  Muscle weakness  (generalized)  Other abnormalities of gait and mobility  Repeated falls  Other low back pain  ONSET DATE: "about a year ago"  SUBJECTIVE:                                                                                                                                                                                           SUBJECTIVE STATEMENT: I saw the neurosurgeon and he thinks I am able to do exercising on my own. I think I will be ready by the end of the month.    PERTINENT HISTORY:  Lumbar laminectomy L4-5  PAIN:  Are you having pain? 3/10 L Rib area and side  PRECAUTIONS: None  WEIGHT BEARING RESTRICTIONS: No  FALLS:  Has patient fallen in last 6 months? Yes. Number of falls 5-6  LIVING ENVIRONMENT: Lives with: lives with their daughter Lives in: House/apartment Stairs: No Has following equipment at home: Single point cane and Environmental consultant - 2 wheeled  OCCUPATION: Retired  PLOF: Independent  PATIENT GOALS: not to fall, get my balance better   NEXT MD VISIT:   OBJECTIVE:   SCREENING FOR RED FLAGS: Bowel or bladder incontinence: No Spinal tumors: No Cauda equina syndrome: No Compression fracture: No Abdominal aneurysm: No  COGNITION: Overall cognitive status: Within functional limits for tasks assessed     SENSATION: WFL  MUSCLE LENGTH: Hamstrings: tightness BLE  POSTURE: rounded shoulders and forward head   LUMBAR ROM:   AROM eval  Flexion Mid shin  Extension 50% w/pain  Right lateral flexion Pain at end range  Left lateral flexion Pain at end range  Right rotation Pain at end range  Left rotation Pain at end range   (Blank rows = not tested)  LOWER EXTREMITY ROM:   grossly [facility]    LOWER EXTREMITY MMT:    MMT Right eval Left eval Right 07/09/22 Left  07/09/22   Hip flexion 3+ 3+ 4+ 4+ 4+  Hip extension 3+ 3+ 4 4 4+  Hip abduction 3+ 3+ '4 4 4  '$ Hip adduction       Hip internal rotation       Hip external rotation       Knee flexion  '4 4 5 5   '$ Knee extension '4 4 5 5   '$ Ankle dorsiflexion       Ankle plantarflexion       Ankle inversion       Ankle eversion        (Blank rows = not tested)  LUMBAR SPECIAL TESTS:  Straight leg raise test: Positive  FUNCTIONAL TESTS:  5 times sit to stand: 24.30s Timed up and go (TUG): 16.13  GAIT: Distance walked: in clinic  Assistive device utilized: None Level of assistance: Modified independence Comments: poor foot clearance, trendelenberg, decreased step length, antalgic gait, slowed speed   TODAY'S TREATMENT:                                                                                                                              DATE:  08/14/22 Bike L4 x73mns  Step ups forwards and laterally BERG and MMT  Shoulder ext 10# 2x10 Leg ext 10# 2x10 HS curls 25# 2x10 Leg press 40# 2x10 STS on airex 2x10   08/09/22 Bike L4 x649ms Step up 4" on top of airex Resisted gait 30# 4 way x4 Standing on airex cone taps  Stepping over obstacles with 3#  Rows and ext standing on airex 2x12 green Leg press 40# 2x10  08/07/22 NuStep L5 x6m14m  Step ups 6"  Leg ext 10# 2x10 HS curls 20# 2x10 S2S 2x10 holding yellow ball on airex  Leg press 20# 2x10 Walking on beam   08/02/22 NuStep L5 x 6 min Seated OHP yellow ball 2x10 S2S holding yellow ball 2x10  Seated Rows & Lats 20lb 2x10  Hamstring curls 20lb 3x10 Leg Ext 10lb 2x10 Negotiating over objects forward and side steps   07/31/22 Bike L4 x6mi24m Leg ext 10# 2x10 HS curls 25# 2x10 Leg press 20# 2x10 Resisted gait 30# 4 x 4way  Step up 4" on airex  Side step ups on BOSU    PATIENT EDUCATION:  Education details: HEP, hip and core weakness contributing to LBP, fall risk Person educated: Patient Education method: Explanation Education comprehension: verbalized understanding  HOME EXERCISE PROGRAM: Access Code: [email or web address] URL: [email or web address] Date: 05/31/2022 Prepared by: MonaAndris Baumannercises - Supine Bridge  - 1 x daily - 7 x weekly - 2 sets - 10 reps - Clamshell with Resistance  - 1 x daily - 7 x weekly - 2 sets - 10 reps - Standing Hip Abduction with Counter Support  - 1 x daily - 7 x weekly - 2 sets - 10 reps - Standing Hip Extension with Counter Support  - 1 x daily - 7  x weekly - 2 sets - 10 reps  ASSESSMENT:  CLINICAL IMPRESSION: 20th visit progress note, she has met all of her goals. Agreed to complete her visits next week and establish an updated HEP prior to discharge.   OBJECTIVE IMPAIRMENTS: Abnormal gait, decreased balance, difficulty walking, decreased strength, and pain.    PARTICIPATION LIMITATIONS: cleaning, laundry, and yard work  [employer] POTENTIAL: Good  CLINICAL DECISION MAKING: Stable/uncomplicated  EVALUATION COMPLEXITY: Low  GOALS: Goals reviewed with patient? No  SHORT TERM GOALS: Target date: 07/12/22  Patient will be independent with initial HEP. Goal status: Met 06/18/22  2.  Patient will be educated on strategies to decrease risk of falls.  Baseline: 5-6 falls in the last 35month Goal status: MET   LONG TERM GOALS: Target date: 08/23/22  Patient will be independent with advanced/ongoing HEP to improve outcomes and carryover.  Goal status: Met   2.  Patient will demonstrate decreased fall risk by scoring < 12 sec on TUG. Baseline: 16.31s Goal status: Met 9.17 sec 06/18/22  3.  Patient will demonstrate decreased fall risk by scoring < 15 sec on 5xSTS. Baseline: 24.30s Goal status: Met 06/18/22 14.16 sec  4.  Patient will demonstrate improved functional LE strength as demonstrated by 5/5. Goal status: MET   5.  Patient will score 52 on Berg Balance test to demonstrate lower risk of falls. (MCID= 8 points) .  Baseline: 44, 51/56, 53/56 Goal status: MET   PLAN:  PT FREQUENCY: 2x/week  PT DURATION: 12 weeks  PLANNED INTERVENTIONS: Therapeutic exercises, Therapeutic activity, Neuromuscular re-education,  Balance training, Gait training, Patient/Family education, Self Care, Joint mobilization, Stair training, Dry Needling, Electrical stimulation, Cryotherapy, Moist heat, Ionotophoresis '4mg'$ /ml Dexamethasone, and Manual therapy.  PLAN FOR NEXT SESSION: LE/hip strengthening, balance training (step ups on airex, side steps on airex, walking on beam, STS on airex), [facility] 08/14/2022, 11:45 AM

## 2022-08-14 ENCOUNTER — Ambulatory Visit: Payer: Medicare Other

## 2022-08-14 DIAGNOSIS — R296 Repeated falls: Secondary | ICD-10-CM | POA: Diagnosis not present

## 2022-08-14 DIAGNOSIS — M5459 Other low back pain: Secondary | ICD-10-CM | POA: Diagnosis not present

## 2022-08-14 DIAGNOSIS — R2689 Other abnormalities of gait and mobility: Secondary | ICD-10-CM

## 2022-08-14 DIAGNOSIS — M6281 Muscle weakness (generalized): Secondary | ICD-10-CM | POA: Diagnosis not present

## 2022-08-15 NOTE — Therapy (Signed)
OUTPATIENT PHYSICAL THERAPY THORACOLUMBAR TREATMENT    Patient Name: [name] MRN: [id number] DOB:1940/11/18, 82 y.o., female Today's Date: 08/16/2022   PT End of Session - 08/16/22 1146     Visit Number 21    Date for PT Re-Evaluation 08/23/22    PT Start Time 1145    Activity Tolerance Patient tolerated treatment well    Behavior During Therapy [facility] for tasks assessed/performed                   Past Medical History:  Diagnosis Date   Anxiety    Arthritis HANDS, NECK , BACK   Asymptomatic carotid artery stenosis LEFT --  MOD. PER DR WILLIS NOTE OCT 2012   Benign heart murmur    Echo 2011 in [place]   Depression    PMH of   Diabetes mellitus ORAL MED   DVT (deep venous thrombosis) ([place]) 2006   after arthroscopy   Frequency of urination    Headache    History of CVA (cerebrovascular accident) NEUROLOGIST  DR WILLIS----  06-01-2010-  LEFT BASAL GANGLIA HEMORRAGE   RESIDUAL RIGHT HAND NUMBNESS/TINGLING   History of laparoscopic cholecystectomy 05/24/2021   Hyperlipidemia    Hypertension    Hypothyroid    MRSA (methicillin resistant staph aureus) culture positive    X3; last post TKR   Nocturia    Numbness and tingling in right hand RESIDUAL FROM CVA NOV 2011   Obesity    Redux therapy   Osteoporosis    Stroke [facility])    Thyroid disease    right thyroid nodule-- BX DONE 08-15-2011   Past Surgical History:  Procedure Laterality Date   ABDOMINAL HYSTERECTOMY  1973   Endometriosis & fibroid   CARPAL TUNNEL RELEASE  2000   RIGHT   CHOLECYSTECTOMY N/A 05/23/2021   Procedure: LAPAROSCOPIC CHOLECYSTECTOMY;  Surgeon: Coralie Keens, MD;  Location: [facility];  Service: General;  Laterality: N/A;   COLONOSCOPY  1999 & 2005   Dr Olevia Perches   cystoscope     to evaluate recurrent UTIs   CYSTOSCOPY W/ RETROGRADES  08/22/2011   Procedure: CYSTOSCOPY WITH RETROGRADE PYELOGRAM;  Surgeon: Molli Hazard, MD;  Location: [facility];  Service: Urology;   Laterality: Bilateral;   CYSTOSCOPY WITH BIOPSY  08/22/2011   Procedure: CYSTOSCOPY WITH BIOPSY;  Surgeon: Molli Hazard, MD;  [facility];  Service: Urology;  Laterality: N/A;   g2 p2     JOINT REPLACEMENT  03-28-2006   LEFT THUMB   KNEE ARTHROSCOPY     BILATERAL PRIOR TO TOTAL KNEE   LEFT SHOULDER SURG  1990'S   LUMBAR LAMINECTOMY  08-26-2009   L 4 - 5   THYROID NODULE BX  08-15-2011   RIGHT   TOTAL KNEE ARTHROPLASTY  05-26-2002      LEFT   AND RIGHT  03-30-2098   WRIST SURGERY  2014   tendon placed in joint; Dr Burney Gauze   Patient Active Problem List   Diagnosis Date Noted   Constipation 08/09/2022   Sciatica of right side 03/27/2022   Calculus of gallbladder with acute cholecystitis without obstruction 05/17/2021   Tingling 03/16/2021   Vitamin B12 deficiency 03/16/2021   Head injury due to trauma 02/23/2021   Low back pain 10/29/2019   Pain in left wrist 10/01/2019   Cervicalgia 07/29/2019   Trigger finger, left middle finger 04/28/2018   Hypothyroidism 03/29/2017   Fecal incontinence 09/25/2016   Generalized anxiety disorder 06/20/2015  SUI (stress urinary incontinence, female) 02/05/2014   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   Cerebral artery occlusion with cerebral infarction ([place]) 05/31/2010   Diabetes ([place]) 01/24/2010   THYROID NODULE, RIGHT 01/21/2009   Vitamin D deficiency 12/10/2007   HYPERLIPIDEMIA 06/17/2007   Essential hypertension 06/17/2007   Osteopenia 06/17/2007   Osteoarthrosis, unspecified whether generalized or localized, unspecified site 11/11/2006    PCP: Billey Gosling  REFERRING PROVIDER: Earnie Larsson  REFERRING DIAG: M54.16  Rationale for Evaluation and Treatment: Rehabilitation  THERAPY DIAG:  Muscle weakness (generalized)  Other abnormalities of gait and mobility  ONSET DATE: "about a year ago"  SUBJECTIVE:                                                                                                                                                                                            SUBJECTIVE STATEMENT: Doing fine, [facility] since [facility] membership    PERTINENT HISTORY:  Lumbar laminectomy L4-5  PAIN:  Are you having pain? 3/10 L Rib area and side  PRECAUTIONS: None  WEIGHT BEARING RESTRICTIONS: No  FALLS:  Has patient fallen in last 6 months? Yes. Number of falls 5-6  LIVING ENVIRONMENT: Lives with: lives with their daughter Lives in: House/apartment Stairs: No Has following equipment at home: Single point cane and Environmental consultant - 2 wheeled  OCCUPATION: Retired  PLOF: Independent  PATIENT GOALS: not to fall, get my balance better   NEXT MD VISIT:   OBJECTIVE:   SCREENING FOR RED FLAGS: Bowel or bladder incontinence: No Spinal tumors: No Cauda equina syndrome: No Compression fracture: No Abdominal aneurysm: No  COGNITION: Overall cognitive status: Within functional limits for tasks assessed     SENSATION: WFL  MUSCLE LENGTH: Hamstrings: tightness BLE  POSTURE: rounded shoulders and forward head   LUMBAR ROM:   AROM eval  Flexion Mid shin  Extension 50% w/pain  Right lateral flexion Pain at end range  Left lateral flexion Pain at end range  Right rotation Pain at end range  Left rotation Pain at end range   (Blank rows = not tested)  LOWER EXTREMITY ROM:   grossly [facility]    LOWER EXTREMITY MMT:    MMT Right eval Left eval Right 07/09/22 Left 07/09/22   Hip flexion 3+ 3+ 4+ 4+ 4+  Hip extension 3+ 3+ 4 4 4+  Hip abduction 3+ 3+ '4 4 4  '$ Hip adduction       Hip internal rotation       Hip external rotation       Knee flexion '4 4 5 '$ 5  Knee extension '4 4 5 5   '$ Ankle dorsiflexion       Ankle plantarflexion       Ankle inversion       Ankle eversion        (Blank rows = not tested)  LUMBAR SPECIAL TESTS:  Straight leg raise test: Positive  FUNCTIONAL TESTS:  5 times sit to stand: 24.30s Timed up  and go (TUG): 16.13  GAIT: Distance walked: in clinic  Assistive device utilized: None Level of assistance: Modified independence Comments: poor foot clearance, trendelenberg, decreased step length, antalgic gait, slowed speed   TODAY'S TREATMENT:                                                                                                                              DATE:  08/16/22 NuStep L5 x75mns  Resisted gait 30# 4x4way Cone taps on airex CGA  Side steps on airex 3 way hip greenTB 2x10  08/14/22 Bike L4 x686ms  Step ups forwards and laterally BERG and MMT  Shoulder ext 10# 2x10 Leg ext 10# 2x10 HS curls 25# 2x10 Leg press 40# 2x10 STS on airex 2x10   08/09/22 Bike L4 x6m18m Step up 4" on top of airex Resisted gait 30# 4 way x4 Standing on airex cone taps  Stepping over obstacles with 3#  Rows and ext standing on airex 2x12 green Leg press 40# 2x10  08/07/22 NuStep L5 x6mi83m Step ups 6"  Leg ext 10# 2x10 HS curls 20# 2x10 S2S 2x10 holding yellow ball on airex  Leg press 20# 2x10 Walking on beam   08/02/22 NuStep L5 x 6 min Seated OHP yellow ball 2x10 S2S holding yellow ball 2x10  Seated Rows & Lats 20lb 2x10  Hamstring curls 20lb 3x10 Leg Ext 10lb 2x10 Negotiating over objects forward and side steps   07/31/22 Bike L4 x6min27mLeg ext 10# 2x10 HS curls 25# 2x10 Leg press 20# 2x10 Resisted gait 30# 4 x 4way  Step up 4" on airex  Side step ups on BOSU    PATIENT EDUCATION:  Education details: HEP, hip and core weakness contributing to LBP, fall risk Person educated: Patient Education method: Explanation Education comprehension: verbalized understanding  HOME EXERCISE PROGRAM: Access Code: [email or web address] URL: [email or web address] Date: 05/31/2022 Prepared by: Jahliyah Trice Andris Baumannrcises - Supine Bridge  - 1 x daily - 7 x weekly - 2 sets - 10 reps - Clamshell with Resistance  - 1 x daily - 7 x weekly - 2 sets - 10 reps - Standing Hip  Abduction with Counter Support  - 1 x daily - 7 x weekly - 2 sets - 10 reps - Standing Hip Extension with Counter Support  - 1 x daily - 7 x weekly - 2 sets - 10 reps  ASSESSMENT:  CLINICAL IMPRESSION: Patient demonstrates good balance and strength overall. She has met all her goals and we agreed to discharge as she will continue her  HEP [facility].   OBJECTIVE IMPAIRMENTS: Abnormal gait, decreased balance, difficulty walking, decreased strength, and pain.    PARTICIPATION LIMITATIONS: cleaning, laundry, and yard work  [employer] POTENTIAL: Good  CLINICAL DECISION MAKING: Stable/uncomplicated  EVALUATION COMPLEXITY: Low  GOALS: Goals reviewed with patient? No  SHORT TERM GOALS: Target date: 07/12/22  Patient will be independent with initial HEP. Goal status: Met 06/18/22  2.  Patient will be educated on strategies to decrease risk of falls.  Baseline: 5-6 falls in the last 94month Goal status: MET   LONG TERM GOALS: Target date: 08/23/22  Patient will be independent with advanced/ongoing HEP to improve outcomes and carryover.  Goal status: Met   2.  Patient will demonstrate decreased fall risk by scoring < 12 sec on TUG. Baseline: 16.31s Goal status: Met 9.17 sec 06/18/22  3.  Patient will demonstrate decreased fall risk by scoring < 15 sec on 5xSTS. Baseline: 24.30s Goal status: Met 06/18/22 14.16 sec  4.  Patient will demonstrate improved functional LE strength as demonstrated by 5/5. Goal status: MET   5.  Patient will score 52 on Berg Balance test to demonstrate lower risk of falls. (MCID= 8 points) .  Baseline: 44, 51/56, 53/56 Goal status: MET   PLAN:  PT FREQUENCY: 2x/week  PT DURATION: 12 weeks  PLANNED INTERVENTIONS: Therapeutic exercises, Therapeutic activity, Neuromuscular re-education, Balance training, Gait training, Patient/Family education, Self Care, Joint mobilization, Stair training, Dry Needling, Electrical stimulation, Cryotherapy,  Moist heat, Ionotophoresis '4mg'$ /ml Dexamethasone, and Manual therapy.  PLAN FOR NEXT SESSION: d/c  PHYSICAL THERAPY DISCHARGE SUMMARY  Visits from Start of Care: 21   Patient agrees to discharge. Patient goals were met. Patient is being discharged due to meeting the stated rehab goals.    [place] PT 08/16/2022, 12:33 PM

## 2022-08-16 ENCOUNTER — Ambulatory Visit: Payer: Medicare Other

## 2022-08-16 DIAGNOSIS — M6281 Muscle weakness (generalized): Secondary | ICD-10-CM

## 2022-08-16 DIAGNOSIS — R2689 Other abnormalities of gait and mobility: Secondary | ICD-10-CM

## 2022-08-16 DIAGNOSIS — R296 Repeated falls: Secondary | ICD-10-CM | POA: Diagnosis not present

## 2022-08-16 DIAGNOSIS — M5459 Other low back pain: Secondary | ICD-10-CM | POA: Diagnosis not present

## 2022-08-23 ENCOUNTER — Other Ambulatory Visit: Payer: Self-pay | Admitting: Internal Medicine

## 2022-08-23 ENCOUNTER — Other Ambulatory Visit: Payer: Self-pay

## 2022-08-27 ENCOUNTER — Telehealth: Payer: Self-pay | Admitting: *Deleted

## 2022-08-27 MED ORDER — TRULICITY 1.5 MG/0.5ML ~~LOC~~ SOAJ: SUBCUTANEOUS | 3 refills | Status: DC

## 2022-08-27 NOTE — Telephone Encounter (Signed)
Trulicity sent to [facility].

## 2022-08-27 NOTE — Telephone Encounter (Signed)
Patient wants to see if she can stay on Trulicity. Patient said [facility] on [address] in [place] would work if it can't be done through her usual pharmacy

## 2022-08-27 NOTE — Telephone Encounter (Signed)
   Is currently unavailable - we received a message from [facility] - can something different be sent in for this pt.

## 2022-09-06 ENCOUNTER — Other Ambulatory Visit (HOSPITAL_COMMUNITY): Payer: Self-pay

## 2022-09-06 ENCOUNTER — Other Ambulatory Visit: Payer: Self-pay

## 2022-09-06 MED ORDER — TRULICITY 3 MG/0.5ML ~~LOC~~ SOAJ
3.0000 mg | SUBCUTANEOUS | 0 refills | Status: DC
  Filled 2022-09-06: qty 2, 28d supply, fill #0

## 2022-09-06 NOTE — Telephone Encounter (Signed)
Spoke with patient.  She wanted to go up one dose so [facility]m [name]  Spoke with BAaron Edelmanand they [place] it [place]

## 2022-09-06 NOTE — Telephone Encounter (Signed)
Not sure if they have it elsewhere - can try a different pharmacy.  Can try a higher or lower dose of trulicity   We can switch to a different medication but may have the same problem with availability - ozempic or mounjaro if covered.     See what she wants to do

## 2022-09-06 NOTE — Telephone Encounter (Signed)
Patient said pharmacy is unable to fill Trulicity because they don't have it. She would like to know if there is an alternative that can be sent in to the [place] on [address] in [place].

## 2022-09-24 ENCOUNTER — Encounter: Payer: Self-pay | Admitting: Internal Medicine

## 2022-09-24 NOTE — Progress Notes (Unsigned)
Subjective:    Patient ID: [name], female    DOB: 23-Feb-1941, 82 y.o.   MRN: [id number]     HPI [name] is here for follow up of her chronic medical problems, including htn, DM, hld, hypothyroid, B12 def, D def, GAD, cervicalgia, sciatic of right sie     Medications and allergies reviewed with patient and updated if appropriate.  Current Outpatient Medications on File Prior to Visit  Medication Sig Dispense Refill   aspirin 81 MG chewable tablet Chew 81 mg by mouth at bedtime.     aspirin-acetaminophen-caffeine (EXCEDRIN MIGRAINE) 250-250-65 MG tablet Take 1 tablet by mouth 2 (two) times daily as needed for headache.     benazepril (LOTENSIN) 20 MG tablet TAKE 1 TABLET DAILY 90 tablet 3   bisacodyl (DULCOLAX) 10 MG suppository Place 1 suppository (10 mg total) rectally as needed for moderate constipation. 5 suppository 0   bisacodyl (DULCOLAX) 5 MG EC tablet Take 1 tablet (5 mg total) by mouth daily as needed for moderate constipation. 15 tablet 0   blood glucose meter kit and supplies KIT Dispense based on patient and insurance preference. Use up to four times daily as directed. (FOR E11.9). 1 each 0   Calcium Citrate-Vitamin D (CALCIUM CITRATE + D PO) Take 1 tablet by mouth at bedtime.     Cholecalciferol (VITAMIN D3) 50 MCG (2000 UT) TABS Take 2,000 Units by mouth at bedtime.     Cyanocobalamin (VITAMIN B-12) 5000 MCG TBDP Take 10,000 mcg by mouth in the morning.     diazepam (VALIUM) 5 MG tablet Take 1 tablet (5 mg total) by mouth at bedtime. 10 tablet 0   docusate sodium (COLACE) 100 MG capsule Take 1 capsule (100 mg total) by mouth 2 (two) times daily as needed for mild constipation. (Patient not taking: Reported on 08/10/2022)     Dulaglutide (TRULICITY) 1.5 0000000 SOPN INJECT 1.5 MG UNDER THE SKIN ONCE A WEEK 6 mL 3   Dulaglutide (TRULICITY) 3 0000000 SOPN Inject 3 mg as directed once a week. 2 mL 0   escitalopram (LEXAPRO) 10 MG tablet TAKE 1 TABLET DAILY 90  tablet 3   estradiol (ESTRACE) 0.1 MG/GM vaginal cream Place 1 Applicatorful vaginally every three (3) days as needed (vaginal dryness).     gabapentin (NEURONTIN) 300 MG capsule Take by mouth.     levothyroxine (SYNTHROID) 50 MCG tablet Take 1 tablet (50 mcg total) by mouth daily. 90 tablet 3   metFORMIN (GLUCOPHAGE-XR) 500 MG 24 hr tablet TAKE 2 TABLETS WITH BREAKFAST AND 1 TABLET WITH DINNER 270 tablet 3   methylcellulose (ARTIFICIAL TEARS) 1 % ophthalmic solution Place 1 drop into both eyes daily as needed (itching eyes).      Multiple Vitamin (MULTIVITAMIN WITH MINERALS) TABS tablet Take 1 tablet by mouth daily. One A Day for Women     nitrofurantoin (MACRODANTIN) 50 MG capsule Take 50 mg by mouth at bedtime.     polyethylene glycol (MIRALAX / GLYCOLAX) 17 g packet Take 17 g by mouth daily as needed for moderate constipation or severe constipation.  0   pravastatin (PRAVACHOL) 40 MG tablet TAKE 1 TABLET AT BEDTIME 90 tablet 3   No current facility-administered medications on file prior to visit.     Review of Systems     Objective:  There were no vitals filed for this visit. BP Readings from Last 3 Encounters:  08/10/22 134/78  03/27/22 126/76  09/21/21 124/76  Wt Readings from Last 3 Encounters:  08/10/22 160 lb (72.6 kg)  03/27/22 162 lb 12.8 oz (73.8 kg)  09/21/21 159 lb 9.6 oz (72.4 kg)   There is no height or weight on file to calculate BMI.    Physical Exam     Lab Results  Component Value Date   WBC 10.7 (H) 03/27/2022   HGB 13.2 03/27/2022   HCT 37.3 03/27/2022   PLT 206.0 03/27/2022   GLUCOSE 124 (H) 03/27/2022   CHOL 130 03/27/2022   TRIG 134.0 03/27/2022   HDL 54.90 03/27/2022   LDLDIRECT 61.0 06/30/2018   LDLCALC 48 03/27/2022   ALT 16 03/27/2022   AST 19 03/27/2022   NA 141 03/27/2022   K 4.1 03/27/2022   CL 105 03/27/2022   CREATININE 0.68 03/27/2022   BUN 17 03/27/2022   CO2 27 03/27/2022   TSH 1.62 03/27/2022   INR 1.8 (H) 04/02/2008    HGBA1C 6.3 03/27/2022   MICROALBUR 1.9 03/27/2022     Assessment & Plan:    See Problem List for Assessment and Plan of chronic medical problems.

## 2022-09-24 NOTE — Patient Instructions (Addendum)
      Blood work was ordered.   The lab is on the first floor.    Medications changes include :   none   Diagnostic mammogram ordered for [facility]   Return in about 6 months (around 03/28/2023) for follow up.

## 2022-09-25 ENCOUNTER — Ambulatory Visit (INDEPENDENT_AMBULATORY_CARE_PROVIDER_SITE_OTHER): Payer: Medicare Other | Admitting: Internal Medicine

## 2022-09-25 VITALS — BP 128/74 | HR 60 | Temp 98.0°F | Ht 66.0 in | Wt 159.0 lb

## 2022-09-25 DIAGNOSIS — E782 Mixed hyperlipidemia: Secondary | ICD-10-CM | POA: Diagnosis not present

## 2022-09-25 DIAGNOSIS — F411 Generalized anxiety disorder: Secondary | ICD-10-CM

## 2022-09-25 DIAGNOSIS — M542 Cervicalgia: Secondary | ICD-10-CM

## 2022-09-25 DIAGNOSIS — E039 Hypothyroidism, unspecified: Secondary | ICD-10-CM | POA: Diagnosis not present

## 2022-09-25 DIAGNOSIS — N6459 Other signs and symptoms in breast: Secondary | ICD-10-CM | POA: Diagnosis not present

## 2022-09-25 DIAGNOSIS — E538 Deficiency of other specified B group vitamins: Secondary | ICD-10-CM

## 2022-09-25 DIAGNOSIS — M5431 Sciatica, right side: Secondary | ICD-10-CM | POA: Diagnosis not present

## 2022-09-25 DIAGNOSIS — E1169 Type 2 diabetes mellitus with other specified complication: Secondary | ICD-10-CM

## 2022-09-25 DIAGNOSIS — I10 Essential (primary) hypertension: Secondary | ICD-10-CM

## 2022-09-25 DIAGNOSIS — E559 Vitamin D deficiency, unspecified: Secondary | ICD-10-CM | POA: Diagnosis not present

## 2022-09-25 LAB — CBC WITH DIFFERENTIAL/PLATELET
Basophils Absolute: 0.1 10*3/uL (ref 0.0–0.1)
Basophils Relative: 0.7 % (ref 0.0–3.0)
Eosinophils Absolute: 0.6 10*3/uL (ref 0.0–0.7)
Eosinophils Relative: 7 % — ABNORMAL HIGH (ref 0.0–5.0)
HCT: 38 % (ref 36.0–46.0)
Hemoglobin: 13.4 g/dL (ref 12.0–15.0)
Lymphocytes Relative: 23 % (ref 12.0–46.0)
Lymphs Abs: 1.8 10*3/uL (ref 0.7–4.0)
MCHC: 35.3 g/dL (ref 30.0–36.0)
MCV: 88.8 fl (ref 78.0–100.0)
Monocytes Absolute: 0.7 10*3/uL (ref 0.1–1.0)
Monocytes Relative: 8.8 % (ref 3.0–12.0)
Neutro Abs: 4.7 10*3/uL (ref 1.4–7.7)
Neutrophils Relative %: 60.5 % (ref 43.0–77.0)
Platelets: 240 10*3/uL (ref 150.0–400.0)
RBC: 4.28 Mil/uL (ref 3.87–5.11)
RDW: 12.5 % (ref 11.5–15.5)
WBC: 7.8 10*3/uL (ref 4.0–10.5)

## 2022-09-25 LAB — COMPREHENSIVE METABOLIC PANEL
ALT: 11 U/L (ref 0–35)
AST: 19 U/L (ref 0–37)
Albumin: 3.9 g/dL (ref 3.5–5.2)
Alkaline Phosphatase: 41 U/L (ref 39–117)
BUN: 15 mg/dL (ref 6–23)
CO2: 27 mEq/L (ref 19–32)
Calcium: 10 mg/dL (ref 8.4–10.5)
Chloride: 101 mEq/L (ref 96–112)
Creatinine, Ser: 0.7 mg/dL (ref 0.40–1.20)
GFR: 79.59 mL/min (ref >60.00)
Glucose, Bld: 117 mg/dL — ABNORMAL HIGH (ref 70–99)
Potassium: 4.3 mEq/L (ref 3.5–5.1)
Sodium: 138 mEq/L (ref 135–145)
Total Bilirubin: 0.6 mg/dL (ref 0.2–1.2)
Total Protein: 6.6 g/dL (ref 6.0–8.3)

## 2022-09-25 LAB — LIPID PANEL
Cholesterol: 125 mg/dL (ref 0–200)
HDL: 46.8 mg/dL (ref >39.00)
LDL Cholesterol: 58 mg/dL (ref 0–99)
NonHDL: 77.84
Total CHOL/HDL Ratio: 3
Triglycerides: 97 mg/dL (ref 0.0–149.0)
VLDL: 19.4 mg/dL (ref 0.0–40.0)

## 2022-09-25 LAB — TSH: TSH: 2.91 u[IU]/mL (ref 0.35–5.50)

## 2022-09-25 LAB — VITAMIN D 25 HYDROXY (VIT D DEFICIENCY, FRACTURES): VITD: 36.55 ng/mL (ref 30.00–100.00)

## 2022-09-25 LAB — VITAMIN B12: Vitamin B-12: 964 pg/mL — ABNORMAL HIGH (ref 211–911)

## 2022-09-25 LAB — HEMOGLOBIN A1C: Hgb A1c MFr Bld: 5.9 % (ref 4.6–6.5)

## 2022-09-25 MED ORDER — TRULICITY 3 MG/0.5ML ~~LOC~~ SOAJ: 3.0000 mg | SUBCUTANEOUS | 0 refills | Status: DC

## 2022-09-25 NOTE — Assessment & Plan Note (Signed)
Chronic   Lab Results  Component Value Date   HGBA1C 6.3 03/27/2022   Sugars well controlled Check [id number] Continue trulicity to 3 mg weekly, metformin 1000 mg am, 500 mg pm Stressed regular exercise, diabetic diet

## 2022-09-25 NOTE — Assessment & Plan Note (Signed)
Chronic Check lipid panel  Continue pravastatin 40 mg daily Regular exercise and healthy diet encouraged  

## 2022-09-25 NOTE — Assessment & Plan Note (Signed)
Chronic BP well controlled Continue benazepril 20 mg daily cmp

## 2022-09-25 NOTE — Assessment & Plan Note (Signed)
Chronic Taking gabapentin 300 mg HS

## 2022-09-25 NOTE — Assessment & Plan Note (Addendum)
Chronic Controlled, stable Continue lexparo 10 mg daily, diazepam 5 mg HS Discussed side effects of long term use of diazepam

## 2022-09-25 NOTE — Assessment & Plan Note (Signed)
Chronic Taking vitamin d daily Check vitamin d level  

## 2022-09-25 NOTE — Assessment & Plan Note (Signed)
[place] intermittent inverted left nipple-tends to come and go for no reason.  Denies inversion at this time Denies palpable lumps, change in skin Last mammogram 03/2022 - normal Likely benign but will get diagnostic mammo and [place] prn

## 2022-09-25 NOTE — Assessment & Plan Note (Signed)
Chronic Did PT Has seen neurosurgery Taking gabapentin 300 mg nightly

## 2022-09-25 NOTE — Assessment & Plan Note (Signed)
Chronic  Clinically euthyroid Check tsh and will titrate med dose if needed Currently taking levothyroxine 50 mcg daily

## 2022-09-25 NOTE — Assessment & Plan Note (Signed)
Chronic ?Check B12 level ?

## 2022-10-04 DIAGNOSIS — R928 Other abnormal and inconclusive findings on diagnostic imaging of breast: Secondary | ICD-10-CM | POA: Diagnosis not present

## 2022-10-04 LAB — HM MAMMOGRAPHY

## 2022-10-11 ENCOUNTER — Encounter: Payer: Self-pay | Admitting: Internal Medicine

## 2022-10-11 NOTE — Progress Notes (Signed)
Outside notes received. Information abstracted. Notes sent to scan.  

## 2022-10-31 ENCOUNTER — Telehealth: Payer: Self-pay | Admitting: Internal Medicine

## 2022-10-31 MED ORDER — TRULICITY 3 MG/0.5ML ~~LOC~~ SOAJ: 3.0000 mg | SUBCUTANEOUS | 0 refills | Status: DC

## 2022-10-31 NOTE — Telephone Encounter (Signed)
Prescription Request  10/31/2022  LOV: 09/25/2022  What is the name of the medication or equipment? Dulaglutide (TRULICITY) 3 MG/0.5ML SOPN   Have you contacted your pharmacy to request a refill? Yes   Which pharmacy would you like this sent to?  [facility] - [place], [place] - [address] [place] [address] Phone: [phone] Fax: [phone]    Patient notified that their request is being sent to the clinical staff for review and that they should receive a response within 2 business days.   Please advise at Mobile [phone] (mobile)

## 2022-10-31 NOTE — Telephone Encounter (Signed)
[facility] 

## 2022-11-26 ENCOUNTER — Other Ambulatory Visit: Payer: Self-pay | Admitting: Internal Medicine

## 2022-11-26 DIAGNOSIS — N3020 Other chronic cystitis without hematuria: Secondary | ICD-10-CM | POA: Diagnosis not present

## 2022-12-07 DIAGNOSIS — M1990 Unspecified osteoarthritis, unspecified site: Secondary | ICD-10-CM | POA: Diagnosis not present

## 2022-12-07 DIAGNOSIS — M810 Age-related osteoporosis without current pathological fracture: Secondary | ICD-10-CM | POA: Diagnosis not present

## 2022-12-07 DIAGNOSIS — Z9181 History of falling: Secondary | ICD-10-CM | POA: Diagnosis not present

## 2022-12-07 DIAGNOSIS — F4322 Adjustment disorder with anxiety: Secondary | ICD-10-CM | POA: Diagnosis not present

## 2022-12-07 DIAGNOSIS — E039 Hypothyroidism, unspecified: Secondary | ICD-10-CM | POA: Diagnosis not present

## 2022-12-07 DIAGNOSIS — E785 Hyperlipidemia, unspecified: Secondary | ICD-10-CM | POA: Diagnosis not present

## 2022-12-07 DIAGNOSIS — N390 Urinary tract infection, site not specified: Secondary | ICD-10-CM | POA: Diagnosis not present

## 2022-12-07 DIAGNOSIS — Z7985 Long-term (current) use of injectable non-insulin antidiabetic drugs: Secondary | ICD-10-CM | POA: Diagnosis not present

## 2022-12-07 DIAGNOSIS — Z8249 Family history of ischemic heart disease and other diseases of the circulatory system: Secondary | ICD-10-CM | POA: Diagnosis not present

## 2022-12-07 DIAGNOSIS — Z7984 Long term (current) use of oral hypoglycemic drugs: Secondary | ICD-10-CM | POA: Diagnosis not present

## 2022-12-07 DIAGNOSIS — I10 Essential (primary) hypertension: Secondary | ICD-10-CM | POA: Diagnosis not present

## 2022-12-07 DIAGNOSIS — Z008 Encounter for other general examination: Secondary | ICD-10-CM | POA: Diagnosis not present

## 2022-12-07 DIAGNOSIS — E1140 Type 2 diabetes mellitus with diabetic neuropathy, unspecified: Secondary | ICD-10-CM | POA: Diagnosis not present

## 2022-12-26 DIAGNOSIS — Z0100 Encounter for examination of eyes and vision without abnormal findings: Secondary | ICD-10-CM | POA: Diagnosis not present

## 2023-01-01 DIAGNOSIS — N3020 Other chronic cystitis without hematuria: Secondary | ICD-10-CM | POA: Diagnosis not present

## 2023-01-07 ENCOUNTER — Other Ambulatory Visit: Payer: Self-pay | Admitting: Internal Medicine

## 2023-01-14 ENCOUNTER — Other Ambulatory Visit: Payer: Self-pay | Admitting: Internal Medicine

## 2023-01-21 ENCOUNTER — Telehealth: Payer: Self-pay | Admitting: Internal Medicine

## 2023-01-21 MED ORDER — DIAZEPAM 5 MG PO TABS: 5.0000 mg | ORAL_TABLET | Freq: Every day | ORAL | 2 refills | Status: DC

## 2023-01-21 NOTE — Telephone Encounter (Signed)
Prescription Request  01/21/2023  LOV: 09/25/2022  What is the name of the medication or equipment?  diazepam (VALIUM) 5 MG tablet   Have you contacted your pharmacy to request a refill? No   Which pharmacy would you like this sent to?  [facility][id number] [place], [place] - [address] AT [facility] OF [facility] [address] [place] [address] Phone: [phone] Fax: [phone]    Patient notified that their request is being sent to the clinical staff for review and that they should receive a response within 2 business days.   Please advise at Mobile [phone] (mobile)

## 2023-02-13 ENCOUNTER — Telehealth: Payer: Self-pay | Admitting: Internal Medicine

## 2023-02-13 NOTE — Telephone Encounter (Signed)
Next OV is 04/02/2023.   Prescription Request  02/13/2023  LOV: 09/25/2022  What is the name of the medication or equipment? diazepam (VALIUM) 5 MG tablet   Have you contacted your pharmacy to request a refill? No   Which pharmacy would you like this sent to?  [facility] #[id number] [place], [place] - [address] AT [facility] OF [facility] [address] [place] [address] Phone: [phone] Fax: [phone]    Patient notified that their request is being sent to the clinical staff for review and that they should receive a response within 2 business days.   Please advise at Mobile [phone] (mobile)

## 2023-02-14 MED ORDER — DIAZEPAM 5 MG PO TABS: 5.0000 mg | ORAL_TABLET | Freq: Every day | ORAL | 0 refills | Status: DC

## 2023-02-14 NOTE — Telephone Encounter (Signed)
Pt called requesting refill valium 5mg . Last fill was 10 days on 01/28/23. No concerning fill hx noted on PDMP. I do not see chart notes in which less than 30 day supply required, therefore 30 days called in.

## 2023-03-01 ENCOUNTER — Ambulatory Visit (INDEPENDENT_AMBULATORY_CARE_PROVIDER_SITE_OTHER): Payer: Medicare HMO

## 2023-03-01 VITALS — Ht 66.0 in | Wt 152.0 lb

## 2023-03-01 DIAGNOSIS — Z0000 Encounter for general adult medical examination without abnormal findings: Secondary | ICD-10-CM

## 2023-03-01 NOTE — Progress Notes (Signed)
Subjective:   [name] is a 82 y.o. female who presents for Medicare Annual (Subsequent) preventive examination.  Visit Complete: Virtual  I connected with  [name] on 03/01/23 by a audio enabled telemedicine application and verified that I am speaking with the correct person using two identifiers.  Patient Location: Home  Provider Location: Home Office  I discussed the limitations of evaluation and management by telemedicine. The patient expressed understanding and agreed to proceed.  Vital Signs: Vital signs are patient reported.   Review of Systems    Cardiac Risk Factors include: advanced age (>67men, >76 women);dyslipidemia;hypertension;diabetes mellitus;Other (see comment), Risk factor comments: hypothyroidism, Osteopenia     Objective:    Today's Vitals   03/01/23 0948 03/01/23 0949  Weight: 152 lb (68.9 kg)   Height: 5\' 6"  (1.676 m)   PainSc:  3    Body mass index is 24.53 kg/m.     03/01/2023   10:01 AM 05/31/2022    9:27 AM 03/07/2022    2:06 PM 05/22/2021    1:16 PM 05/15/2021    9:54 AM 03/01/2021    8:54 AM 02/17/2021    3:09 PM  Advanced Directives  Does Patient Have a Medical Advance Directive? Yes Yes Yes Yes No Yes Yes  Type of Estate agent of [place];Living will Living will Living will;Healthcare Power of [employer] Power of [place];Living will  Living will;Healthcare Power of [employer] Power of [place];Living will  Does patient want to make changes to medical advance directive?  No - Patient declined No - Patient declined   No - Patient declined   Copy of Healthcare Power of Attorney in Chart? No - copy requested  No - copy requested No - copy requested  No - copy requested   Would patient like information on creating a medical advance directive?     No - Patient declined      Current Medications (verified) Outpatient Encounter Medications as of 03/01/2023  Medication Sig   aspirin 81 MG chewable  tablet Chew 81 mg by mouth at bedtime.   aspirin-acetaminophen-caffeine (EXCEDRIN MIGRAINE) 250-250-65 MG tablet Take 1 tablet by mouth 2 (two) times daily as needed for headache.   benazepril (LOTENSIN) 20 MG tablet TAKE 1 TABLET DAILY   bisacodyl (DULCOLAX) 10 MG suppository Place 1 suppository (10 mg total) rectally as needed for moderate constipation.   bisacodyl (DULCOLAX) 5 MG EC tablet Take 1 tablet (5 mg total) by mouth daily as needed for moderate constipation.   blood glucose meter kit and supplies KIT Dispense based on patient and insurance preference. Use up to four times daily as directed. (FOR E11.9).   Cholecalciferol (VITAMIN D3) 50 MCG (2000 UT) TABS Take 2,000 Units by mouth at bedtime.   Cyanocobalamin (VITAMIN B-12) 5000 MCG TBDP Take 10,000 mcg by mouth in the morning.   diazepam (VALIUM) 5 MG tablet Take 1 tablet (5 mg total) by mouth at bedtime.   Dulaglutide (TRULICITY) 3 MG/0.5ML SOPN Inject 3 mg under the skin once a week. Per MD Return in about 6 months (around 03/28/2023) for follow up.   escitalopram (LEXAPRO) 10 MG tablet TAKE 1 TABLET DAILY   estradiol (ESTRACE) 0.1 MG/GM vaginal cream Place 1 Applicatorful vaginally every three (3) days as needed (vaginal dryness).   gabapentin (NEURONTIN) 300 MG capsule Take 300 mg by mouth 2 (two) times daily.   levothyroxine (SYNTHROID) 50 MCG tablet Take 1 tablet (50 mcg total) by mouth daily.  metFORMIN (GLUCOPHAGE-XR) 500 MG 24 hr tablet TAKE 2 TABLETS WITH BREAKFAST AND 1 TABLET WITH DINNER   methylcellulose (ARTIFICIAL TEARS) 1 % ophthalmic solution Place 1 drop into both eyes daily as needed (itching eyes).    Multiple Vitamin (MULTIVITAMIN WITH MINERALS) TABS tablet Take 1 tablet by mouth daily. One A Day for Women   pravastatin (PRAVACHOL) 40 MG tablet TAKE 1 TABLET AT BEDTIME   Calcium Citrate-Vitamin D (CALCIUM CITRATE + D PO) Take 1 tablet by mouth at bedtime. (Patient not taking: Reported on 03/01/2023)   docusate sodium  (COLACE) 100 MG capsule Take 1 capsule (100 mg total) by mouth 2 (two) times daily as needed for mild constipation. (Patient not taking: Reported on 03/01/2023)   nitrofurantoin (MACRODANTIN) 50 MG capsule Take 50 mg by mouth at bedtime. (Patient not taking: Reported on 03/01/2023)   polyethylene glycol (MIRALAX / GLYCOLAX) 17 g packet Take 17 g by mouth daily as needed for moderate constipation or severe constipation. (Patient not taking: Reported on 03/01/2023)   No facility-administered encounter medications on file as of 03/01/2023.    Allergies (verified) Conjugated estrogens, Percocet [oxycodone-acetaminophen], and Detrol [tolterodine]   History: Past Medical History:  Diagnosis Date   Anxiety    Arthritis HANDS, NECK , BACK   Asymptomatic carotid artery stenosis LEFT --  MOD. PER DR WILLIS NOTE OCT 2012   Benign heart murmur    Echo 2011 in Epic   Depression    PMH of   Diabetes mellitus ORAL MED   DVT (deep venous thrombosis) (HCC) 2006   after arthroscopy   Frequency of urination    Headache    History of CVA (cerebrovascular accident) NEUROLOGIST  DR WILLIS----  06-01-2010-  LEFT BASAL GANGLIA HEMORRAGE   RESIDUAL RIGHT HAND NUMBNESS/TINGLING   History of laparoscopic cholecystectomy 05/24/2021   Hyperlipidemia    Hypertension    Hypothyroid    MRSA (methicillin resistant staph aureus) culture positive    X3; last post TKR   Nocturia    Numbness and tingling in right hand RESIDUAL FROM CVA NOV 2011   Obesity    Redux therapy   Osteoporosis    Stroke [facility])    Thyroid disease    right thyroid nodule-- BX DONE 08-15-2011   Past Surgical History:  Procedure Laterality Date   ABDOMINAL HYSTERECTOMY  1973   Endometriosis & fibroid   CARPAL TUNNEL RELEASE  2000   RIGHT   CHOLECYSTECTOMY N/A 05/23/2021   Procedure: LAPAROSCOPIC CHOLECYSTECTOMY;  Surgeon: Barbara Miyamoto, MD;  Location: [facility];  Service: General;  Laterality: N/A;   COLONOSCOPY  1999 & 2005   Dr [name]    cystoscope     to evaluate recurrent UTIs   CYSTOSCOPY W/ RETROGRADES  08/22/2011   Procedure: CYSTOSCOPY WITH RETROGRADE PYELOGRAM;  Surgeon: Barbara Cage, MD;  Location[facility];  Service: Urology;  Laterality: Bilateral;   CYSTOSCOPY WITH BIOPSY  08/22/2011   Procedure: CYSTOSCOPY WITH BIOPSY;  Surgeon: Barbara Cage, MD;  Location: [facility];  Service: Urology;  Laterality: N/A;   g2 p2     JOINT REPLACEMENT  03-28-2006   LEFT THUMB   KNEE ARTHROSCOPY     BILATERAL PRIOR TO TOTAL KNEE   LEFT SHOULDER SURG  1990'S   LUMBAR LAMINECTOMY  08-26-2009   L 4 - 5   THYROID NODULE BX  08-15-2011   RIGHT   TOTAL KNEE ARTHROPLASTY  05-26-2002      LEFT  AND RIGHT  03-30-2098   WRIST SURGERY  2014   tendon placed in joint; Dr Mina Marble   Family History  Problem Relation Age of Onset   Bladder Cancer Mother    Diabetes Mother    Brain cancer Sister    Stroke Paternal Grandfather        in 26s   Diabetes Sister 110       TYPE 1    Prostate cancer Brother    Alzheimer's disease Brother    Diabetes Maternal Aunt    Diabetes Maternal Uncle    Diabetes Maternal Grandmother        Type 2   Diabetes Maternal Grandfather        Type 2   Pancreatic cancer Sister    Kidney failure Sister        in context of DM & influenza   Heart disease Neg Hx    Social History   Socioeconomic History   Marital status: Widowed    Spouse name: Not on file   Number of children: 2   Years of education: Not on file   Highest education level: Not on file  Occupational History   Occupation: Retired  Tobacco Use   Smoking status: Never   Smokeless tobacco: Never  Vaping Use   Vaping status: Never Used  Substance and Sexual Activity   Alcohol use: Yes    Comment:  very rarely , < 1 glass wine / month   Drug use: No   Sexual activity: Not Currently    Birth control/protection: Surgical  Other Topics Concern   Not on file  Social History  Narrative   Lives with Daughter and granddaughter   Social Determinants of Health   Financial Resource Strain: Low Risk  (03/01/2023)   Overall Financial Resource Strain (CARDIA)    Difficulty of Paying Living Expenses: Not hard at all  Food Insecurity: No Food Insecurity (03/01/2023)   Hunger Vital Sign    Worried About Running Out of Food in the Last Year: Never true    Ran Out of Food in the Last Year: Never true  Transportation Needs: No Transportation Needs (03/01/2023)   PRAPARE - Administrator, Civil Service (Medical): No    Lack of Transportation (Non-Medical): No  Physical Activity: Insufficiently Active (03/01/2023)   Exercise Vital Sign    Days of Exercise per Week: 2 days    Minutes of Exercise per Session: 20 min  Stress: No Stress Concern Present (03/01/2023)   [employer] of Occupational Health - Occupational Stress Questionnaire    Feeling of Stress : Only a little  Social Connections: Moderately Integrated (03/01/2023)   Social Connection and Isolation Panel [NHANES]    Frequency of Communication with Friends and Family: More than three times a week    Frequency of Social Gatherings with Friends and Family: Three times a week    Attends Religious Services: More than 4 times per year    Active Member of Clubs or Organizations: Yes    Attends Banker Meetings: 1 to 4 times per year    Marital Status: Widowed    Tobacco Counseling Counseling given: Not Answered   Clinical Intake:  Pre-visit preparation completed: Yes  Pain : 0-10 Pain Score: 3  Pain Type: Acute pain Pain Location: Back Pain Orientation: Mid Pain Descriptors / Indicators: Discomfort Pain Onset: More than a month ago Pain Relieving Factors: Tylenol  Pain Relieving Factors: Tylenol  BMI - recorded: 24.53  Nutritional Status: BMI of 19-24  Normal Nutritional Risks: None Diabetes: Yes CBG done?: No (per pt-135 fasting) Did pt. bring in CBG monitor from home?:  No  How often do you need to have someone help you when you read instructions, pamphlets, or other written materials from your doctor or pharmacy?: 1 - Never  Interpreter Needed?: No  Information entered by ::  , RMA   Activities of Daily Living    03/01/2023    9:51 AM 03/07/2022    2:11 PM  In your present state of health, do you have any difficulty performing the following activities:  Hearing? 0 0  Vision? 0 0  Difficulty concentrating or making decisions? 0 0  Walking or climbing stairs? 1 1  Comment Per pt-has to hold on to rail.   Dressing or bathing? 0 0  Doing errands, shopping?  0  Preparing Food and eating ? N N  Using the Toilet? N N  In the past six months, have you accidently leaked urine? Y Y  Do you have problems with loss of bowel control? Y Y  Comment per pt-once in awhile   Managing your Medications? N N  Managing your Finances? N N  Housekeeping or managing your Housekeeping? N Y    Patient Care Team: Pincus Sanes, MD as PCP - General (Internal Medicine) Stephannie Li, MD as Consulting Physician (Ophthalmology) [facility] [place] as Consulting Physician (Optometry)  Indicate any recent Medical Services you may have received from other than [facility] providers in the past year (date may be approximate).     Assessment:   This is a routine wellness examination for Pedro.  Hearing/Vision screen Hearing Screening - Comments:: Denies hearing difficulties   Vision Screening - Comments:: Wears eyeglasses  Dietary issues and exercise activities discussed:     Goals Addressed   None   Depression Screen    03/01/2023   10:10 AM 09/25/2022   11:24 AM 08/10/2022   11:08 AM 08/10/2022   11:07 AM 03/07/2022    2:10 PM 09/29/2021   11:10 AM 03/01/2021    8:53 AM  PHQ 2/9 Scores  PHQ - 2 Score 1 0 1 1 2  0 0  PHQ- 9 Score 4 2 4  4       Fall Risk    03/01/2023   10:02 AM 09/25/2022   11:24 AM 08/10/2022   11:07 AM 03/07/2022    2:07 PM  09/29/2021   11:10 AM  Fall Risk   Falls in the past year? 1 0 0  1  Number falls in past yr: 1 0 0 1 1  Comment    7 falls per patient   Injury with Fall? 1 0 0 1 1  Risk for fall due to : Medication side effect No Fall Risks No Fall Risks Impaired balance/gait;Orthopedic patient;History of fall(s) History of fall(s)  Follow up Falls evaluation completed;Falls prevention discussed Falls evaluation completed Falls evaluation completed Falls prevention discussed Falls evaluation completed    MEDICARE RISK AT HOME:  Medicare Risk at Home - 03/01/23 1004     Any stairs in or around the home? No    Home free of loose throw rugs in walkways, pet beds, electrical cords, etc? Yes    Adequate lighting in your home to reduce risk of falls? Yes    Life alert? No    Use of a cane, walker or w/c? No    Grab bars in the bathroom? Yes  Shower chair or bench in shower? Yes    Elevated toilet seat or a handicapped toilet? Yes             TIMED UP AND GO:  Was the test performed?  No    Cognitive Function:    03/25/2018   11:47 AM 03/19/2017   12:17 PM 01/12/2015    9:27 AM  MMSE - Mini Mental State Exam  Not completed:   Unable to complete  Orientation to time 5 5   Orientation to Place 5 5   Registration 3 3   Attention/ Calculation 5 5   Recall 3 2   Language- name 2 objects 2 2   Language- repeat 1 1   Language- follow 3 step command 3 3   Language- read & follow direction 1 1   Write a sentence 1 1   Copy design 1 1   Total score 30 29         03/01/2023   10:05 AM 03/07/2022    2:18 PM  6CIT Screen  What Year? 0 points 0 points  What month? 0 points 0 points  What time? 0 points 0 points  Count back from 20 0 points 0 points  Months in reverse 0 points 0 points  Repeat phrase 0 points 0 points  Total Score 0 points 0 points    Immunizations Immunization History  Administered Date(s) Administered   Fluad Quad(high Dose 65+) 05/06/2022   Influenza Whole  07/24/1999, 06/13/2005, 06/17/2007, 04/14/2008, 04/11/2010   Influenza, High Dose Seasonal PF 03/28/2016, 03/19/2017, 03/25/2018   Influenza, Seasonal, Injecte, Preservative Fre 06/15/2014   Influenza-Unspecified 04/22/2013, 05/02/2015   PFIZER(Purple Top)SARS-COV-2 Vaccination 08/11/2019, 09/01/2019, 04/28/2020   Pneumococcal Conjugate-13 01/12/2015   Pneumococcal Polysaccharide-23 08/03/2004   Td 08/05/1996, 09/29/2010   Zoster Recombinant(Shingrix) 01/06/2018, 03/22/2018   Zoster, Live 12/21/2010    TDAP status: Due, Education has been provided regarding the importance of this vaccine. Advised may receive this vaccine at local pharmacy or Health Dept. Aware to provide a copy of the vaccination record if obtained from local pharmacy or Health Dept. Verbalized acceptance and understanding.  Flu Vaccine status: Up to date  Pneumococcal vaccine status: Up to date  Covid-19 vaccine status: Completed vaccines  Qualifies for Shingles Vaccine? Yes   Zostavax completed Yes   Shingrix Completed?: Yes  Screening Tests Health Maintenance  Topic Date Due   DTaP/Tdap/Td (3 - Tdap) 09/28/2020   Diabetic kidney evaluation - Urine ACR  03/28/2023   COVID-19 Vaccine (4 - 2023-24 season) 07/23/2023 (Originally 03/23/2022)   INFLUENZA VACCINE  10/21/2023 (Originally 02/21/2023)   FOOT EXAM  03/28/2023   HEMOGLOBIN A1C  03/28/2023   OPHTHALMOLOGY EXAM  05/08/2023   Diabetic kidney evaluation - eGFR measurement  09/25/2023   Medicare Annual Wellness (AWV)  02/29/2024   DEXA SCAN  09/15/2024   Pneumonia Vaccine 58+ Years old  Completed   Zoster Vaccines- Shingrix  Completed   HPV VACCINES  Aged Out    Health Maintenance  Health Maintenance Due  Topic Date Due   DTaP/Tdap/Td (3 - Tdap) 09/28/2020   Diabetic kidney evaluation - Urine ACR  03/28/2023    Colorectal cancer screening: No longer required.   Mammogram status: Completed 10/04/2022. Repeat every year  Bone Density status:  Completed 09/16/2019. Results reflect: Bone density results: OSTEOPENIA. Repeat every 5 years.  Lung Cancer Screening: (Low Dose CT Chest recommended if Age 85-80 years, 20 pack-year currently smoking OR have quit w/in 15years.) does  not qualify.   Lung Cancer Screening Referral: N/a  Additional Screening:  Hepatitis C Screening: does not qualify  Vision Screening: Recommended annual ophthalmology exams for early detection of glaucoma and other disorders of the eye. Is the patient up to date with their annual eye exam?  Yes  Who is the provider or what is the name of the office in which the patient attends annual eye exams? [place] If pt is not established with a provider, would they like to be referred to a provider to establish care? No .   Dental Screening: Recommended annual dental exams for proper oral hygiene  Diabetic Foot Exam: Diabetic Foot Exam: Completed 03/27/2022  Community Resource Referral / Chronic Care Management: CRR required this visit?  No   CCM required this visit?  No     Plan:     I have personally reviewed and noted the following in the patient's chart:   Medical and social history Use of alcohol, tobacco or illicit drugs  Current medications and supplements including opioid prescriptions. Patient is not currently taking opioid prescriptions. Functional ability and status Nutritional status Physical activity Advanced directives List of other physicians Hospitalizations, surgeries, and ER visits in previous 12 months Vitals Screenings to include cognitive, depression, and falls Referrals and appointments  In addition, I have reviewed and discussed with patient certain preventive protocols, quality metrics, and best practice recommendations. A written personalized care plan for preventive services as well as general preventive health recommendations were provided to patient.      L , CMA   03/01/2023   After Visit Summary: (MyChart) Due to  this being a telephonic visit, the after visit summary with patients personalized plan was offered to patient via MyChart   Nurse Notes: Patient is due for a Tdap and would like to receive that during her up coming office visit with Dr. Lawerance Bach.  She also would like to discuss her DEXA scan with Dr. Lawerance Bach.  Her last one was 09/16/2019, Osteopenia.

## 2023-03-01 NOTE — Patient Instructions (Signed)
[name] , Thank you for taking time to come for your Medicare Wellness Visit. I appreciate your ongoing commitment to your health goals. Please review the following plan we discussed and let me know if I can assist you in the future.   Referrals/Orders/Follow-Ups/Clinician Recommendations: Your are due for your tetanus vaccine and will have that done at your upcoming office visit.  Flu season is here and you are to get that and also the latest Covid vaccine, (by choice).  Remember to discuss with PCP about your bone density scan, if you should with the 5 yrs or get it done before then.  It was a pleasure speaking with you and keep up the good work.  This is a list of the screening recommended for you and due dates:  Health Maintenance  Topic Date Due   DTaP/Tdap/Td vaccine (3 - Tdap) 09/28/2020   Yearly kidney health urinalysis for diabetes  03/28/2023   COVID-19 Vaccine (4 - 2023-24 season) 07/23/2023*   Flu Shot  10/21/2023*   Complete foot exam   03/28/2023   Hemoglobin A1C  03/28/2023   Eye exam for diabetics  05/08/2023   Yearly kidney function blood test for diabetes  09/25/2023   Medicare Annual Wellness Visit  02/29/2024   DEXA scan (bone density measurement)  09/15/2024   Pneumonia Vaccine  Completed   Zoster (Shingles) Vaccine  Completed   HPV Vaccine  Aged Out  *Topic was postponed. The date shown is not the original due date.    Advanced directives: (Copy Requested) Please bring a copy of your health care power of attorney and living will to the office to be added to your chart at your convenience.  Next Medicare Annual Wellness Visit scheduled for next year: Yes  Preventive Care 74 Years and Older, Female Preventive care refers to lifestyle choices and visits with your health care provider that can promote health and wellness. What does preventive care include? A yearly physical exam. This is also called an annual well check. Dental exams once or twice a year. Routine  eye exams. Ask your health care provider how often you should have your eyes checked. Personal lifestyle choices, including: Daily care of your teeth and gums. Regular physical activity. Eating a healthy diet. Avoiding tobacco and drug use. Limiting alcohol use. Practicing safe sex. Taking low-dose aspirin every day. Taking vitamin and mineral supplements as recommended by your health care provider. What happens during an annual well check? The services and screenings done by your health care provider during your annual well check will depend on your age, overall health, lifestyle risk factors, and family history of disease. Counseling  Your health care provider may ask you questions about your: Alcohol use. Tobacco use. Drug use. Emotional well-being. Home and relationship well-being. Sexual activity. Eating habits. History of falls. Memory and ability to understand (cognition). Work and work Astronomer. Reproductive health. Screening  You may have the following tests or measurements: Height, weight, and BMI. Blood pressure. Lipid and cholesterol levels. These may be checked every 5 years, or more frequently if you are over 60 years old. Skin check. Lung cancer screening. You may have this screening every year starting at age 36 if you have a 30-pack-year history of smoking and currently smoke or have quit within the past 15 years. Fecal occult blood test (FOBT) of the stool. You may have this test every year starting at age 56. Flexible sigmoidoscopy or colonoscopy. You may have a sigmoidoscopy every 5 years or  a colonoscopy every 10 years starting at age 72. Hepatitis C blood test. Hepatitis B blood test. Sexually transmitted disease (STD) testing. Diabetes screening. This is done by checking your blood sugar (glucose) after you have not eaten for a while (fasting). You may have this done every 1-3 years. Bone density scan. This is done to screen for osteoporosis. You may  have this done starting at age 43. Mammogram. This may be done every 1-2 years. Talk to your health care provider about how often you should have regular mammograms. Talk with your health care provider about your test results, treatment options, and if necessary, the need for more tests. Vaccines  Your health care provider may recommend certain vaccines, such as: Influenza vaccine. This is recommended every year. Tetanus, diphtheria, and acellular pertussis (Tdap, Td) vaccine. You may need a Td booster every 10 years. Zoster vaccine. You may need this after age 28. Pneumococcal 13-valent conjugate (PCV13) vaccine. One dose is recommended after age 30. Pneumococcal polysaccharide (PPSV23) vaccine. One dose is recommended after age 47. Talk to your health care provider about which screenings and vaccines you need and how often you need them. This information is not intended to replace advice given to you by your health care provider. Make sure you discuss any questions you have with your health care provider. Document Released: 08/05/2015 Document Revised: 03/28/2016 Document Reviewed: 05/10/2015 Elsevier Interactive Patient Education  2017 [employer].  Fall Prevention in the Home Falls can cause injuries. They can happen to people of all ages. There are many things you can do to make your home safe and to help prevent falls. What can I do on the outside of my home? Regularly fix the edges of walkways and driveways and fix any cracks. Remove anything that might make you trip as you walk through a door, such as a raised step or threshold. Trim any bushes or trees on the path to your home. Use bright outdoor lighting. Clear any walking paths of anything that might make someone trip, such as rocks or tools. Regularly check to see if handrails are loose or broken. Make sure that both sides of any steps have handrails. Any raised decks and porches should have guardrails on the edges. Have any  leaves, snow, or ice cleared regularly. Use sand or salt on walking paths during winter. Clean up any spills in your garage right away. This includes oil or grease spills. What can I do in the bathroom? Use night lights. Install grab bars by the toilet and in the tub and shower. Do not use towel bars as grab bars. Use non-skid mats or decals in the tub or shower. If you need to sit down in the shower, use a plastic, non-slip stool. Keep the floor dry. Clean up any water that spills on the floor as soon as it happens. Remove soap buildup in the tub or shower regularly. Attach bath mats securely with double-sided non-slip rug tape. Do not have throw rugs and other things on the floor that can make you trip. What can I do in the bedroom? Use night lights. Make sure that you have a light by your bed that is easy to reach. Do not use any sheets or blankets that are too big for your bed. They should not hang down onto the floor. Have a firm chair that has side arms. You can use this for support while you get dressed. Do not have throw rugs and other things on the floor that  can make you trip. What can I do in the kitchen? Clean up any spills right away. Avoid walking on wet floors. Keep items that you use a lot in easy-to-reach places. If you need to reach something above you, use a strong step stool that has a grab bar. Keep electrical cords out of the way. Do not use floor polish or wax that makes floors slippery. If you must use wax, use non-skid floor wax. Do not have throw rugs and other things on the floor that can make you trip. What can I do with my stairs? Do not leave any items on the stairs. Make sure that there are handrails on both sides of the stairs and use them. Fix handrails that are broken or loose. Make sure that handrails are as long as the stairways. Check any carpeting to make sure that it is firmly attached to the stairs. Fix any carpet that is loose or worn. Avoid  having throw rugs at the top or bottom of the stairs. If you do have throw rugs, attach them to the floor with carpet tape. Make sure that you have a light switch at the top of the stairs and the bottom of the stairs. If you do not have them, ask someone to add them for you. What else can I do to help prevent falls? Wear shoes that: Do not have high heels. Have rubber bottoms. Are comfortable and fit you well. Are closed at the toe. Do not wear sandals. If you use a stepladder: Make sure that it is fully opened. Do not climb a closed stepladder. Make sure that both sides of the stepladder are locked into place. Ask someone to hold it for you, if possible. Clearly mark and make sure that you can see: Any grab bars or handrails. First and last steps. Where the edge of each step is. Use tools that help you move around (mobility aids) if they are needed. These include: Canes. Walkers. Scooters. Crutches. Turn on the lights when you go into a dark area. Replace any light bulbs as soon as they burn out. Set up your furniture so you have a clear path. Avoid moving your furniture around. If any of your floors are uneven, fix them. If there are any pets around you, be aware of where they are. Review your medicines with your doctor. Some medicines can make you feel dizzy. This can increase your chance of falling. Ask your doctor what other things that you can do to help prevent falls. This information is not intended to replace advice given to you by your health care provider. Make sure you discuss any questions you have with your health care provider. Document Released: 05/05/2009 Document Revised: 12/15/2015 Document Reviewed: 08/13/2014 Elsevier Interactive Patient Education  2017 [employer].

## 2023-03-07 ENCOUNTER — Encounter (INDEPENDENT_AMBULATORY_CARE_PROVIDER_SITE_OTHER): Payer: Self-pay

## 2023-03-28 ENCOUNTER — Other Ambulatory Visit: Payer: Self-pay | Admitting: Internal Medicine

## 2023-04-01 NOTE — Patient Instructions (Addendum)
      Blood work was ordered.   The lab is on the first floor.    Medications changes include :   none    A referral was ordered and someone will call you to schedule an appointment.     Return in about 6 months (around 09/30/2023) for follow up.

## 2023-04-01 NOTE — Progress Notes (Unsigned)
Subjective:    Patient ID: [name], female    DOB: 1940/09/11, 82 y.o.   MRN: [id number]     HPI Ong is here for follow up of her chronic medical problems.  Has increased stress - her daughter has probable dementia and she is living with her - her granddaughter has been living with her as well taking care of her daughter.    Has had some increased constipation, bloating and gas Not sure if it is related to her gallbladder surgery or the Trulicity.  No regular exercise - makes her arthritis worse.    Medications and allergies reviewed with patient and updated if appropriate.  Current Outpatient Medications on File Prior to Visit  Medication Sig Dispense Refill   aspirin 81 MG chewable tablet Chew 81 mg by mouth at bedtime.     aspirin-acetaminophen-caffeine (EXCEDRIN MIGRAINE) 250-250-65 MG tablet Take 1 tablet by mouth 2 (two) times daily as needed for headache.     benazepril (LOTENSIN) 20 MG tablet TAKE 1 TABLET DAILY 90 tablet 3   bisacodyl (DULCOLAX) 10 MG suppository Place 1 suppository (10 mg total) rectally as needed for moderate constipation. 5 suppository 0   bisacodyl (DULCOLAX) 5 MG EC tablet Take 1 tablet (5 mg total) by mouth daily as needed for moderate constipation. 15 tablet 0   blood glucose meter kit and supplies KIT Dispense based on patient and insurance preference. Use up to four times daily as directed. (FOR E11.9). 1 each 0   Cholecalciferol (VITAMIN D3) 50 MCG (2000 UT) TABS Take 2,000 Units by mouth at bedtime.     Cyanocobalamin (VITAMIN B-12) 5000 MCG TBDP Take 10,000 mcg by mouth in the morning.     diazepam (VALIUM) 5 MG tablet Take 1 tablet (5 mg total) by mouth at bedtime. 30 tablet 0   Dulaglutide (TRULICITY) 3 MG/0.5ML SOPN ADMINISTER 3 MG UNDER THE SKIN 1 TIME A WEEK AS DIRECTED 6 mL 0   escitalopram (LEXAPRO) 10 MG tablet TAKE 1 TABLET DAILY 90 tablet 3   estradiol (ESTRACE) 0.1 MG/GM vaginal cream Place 1 Applicatorful vaginally  every three (3) days as needed (vaginal dryness).     gabapentin (NEURONTIN) 300 MG capsule Take 300 mg by mouth 2 (two) times daily.     levothyroxine (SYNTHROID) 50 MCG tablet Take 1 tablet (50 mcg total) by mouth daily. 90 tablet 3   metFORMIN (GLUCOPHAGE-XR) 500 MG 24 hr tablet TAKE 2 TABLETS WITH BREAKFAST AND 1 TABLET WITH DINNER 270 tablet 2   Multiple Vitamin (MULTIVITAMIN WITH MINERALS) TABS tablet Take 1 tablet by mouth daily. One A Day for Women     pravastatin (PRAVACHOL) 40 MG tablet TAKE 1 TABLET AT BEDTIME 90 tablet 3   No current facility-administered medications on file prior to visit.     Review of Systems  Constitutional:  Negative for fever.  HENT:  Positive for ear pain (recently with flying recently - not now).   Respiratory:  Negative for cough, shortness of breath and wheezing.   Cardiovascular:  Negative for chest pain, palpitations and leg swelling.  Neurological:  Positive for dizziness (balance is off and gets dizzy at times). Negative for light-headedness and headaches.       Objective:   Vitals:   04/02/23 1111  BP: 126/72  Pulse: 72  Temp: 98 F (36.7 C)  SpO2: 98%   BP Readings from Last 3 Encounters:  04/02/23 126/72  09/25/22 128/74  08/10/22 134/78  Wt Readings from Last 3 Encounters:  04/02/23 156 lb (70.8 kg)  03/01/23 152 lb (68.9 kg)  09/25/22 159 lb (72.1 kg)   Body mass index is 25.18 kg/m.    Physical Exam Constitutional:      General: She is not in acute distress.    Appearance: Normal appearance.  HENT:     Head: Normocephalic and atraumatic.  Eyes:     Conjunctiva/sclera: Conjunctivae normal.  Cardiovascular:     Rate and Rhythm: Normal rate and regular rhythm.     Heart sounds: Normal heart sounds.  Pulmonary:     Effort: Pulmonary effort is normal. No respiratory distress.     Breath sounds: Normal breath sounds. No wheezing.  Musculoskeletal:     Cervical back: Neck supple.     Right lower leg: No edema.      Left lower leg: No edema.  Lymphadenopathy:     Cervical: No cervical adenopathy.  Skin:    General: Skin is warm and dry.     Findings: No rash.  Neurological:     Mental Status: She is alert. Mental status is at baseline.  Psychiatric:        Mood and Affect: Mood normal.        Behavior: Behavior normal.        Lab Results  Component Value Date   WBC 7.8 09/25/2022   HGB 13.4 09/25/2022   HCT 38.0 09/25/2022   PLT 240.0 09/25/2022   GLUCOSE 117 (H) 09/25/2022   CHOL 125 09/25/2022   TRIG 97.0 09/25/2022   HDL 46.80 09/25/2022   LDLDIRECT 61.0 06/30/2018   LDLCALC 58 09/25/2022   ALT 11 09/25/2022   AST 19 09/25/2022   NA 138 09/25/2022   K 4.3 09/25/2022   CL 101 09/25/2022   CREATININE 0.70 09/25/2022   BUN 15 09/25/2022   CO2 27 09/25/2022   TSH 2.91 09/25/2022   INR 1.8 (H) 04/02/2008   HGBA1C 5.9 09/25/2022   MICROALBUR 1.9 03/27/2022     Assessment & Plan:    See Problem List for Assessment and Plan of chronic medical problems.   Reviewed vaccines she should consider getting

## 2023-04-02 ENCOUNTER — Encounter: Payer: Self-pay | Admitting: Internal Medicine

## 2023-04-02 ENCOUNTER — Ambulatory Visit (INDEPENDENT_AMBULATORY_CARE_PROVIDER_SITE_OTHER): Payer: Medicare HMO | Admitting: Internal Medicine

## 2023-04-02 VITALS — BP 126/72 | HR 72 | Temp 98.0°F | Ht 66.0 in | Wt 156.0 lb

## 2023-04-02 DIAGNOSIS — Z7984 Long term (current) use of oral hypoglycemic drugs: Secondary | ICD-10-CM

## 2023-04-02 DIAGNOSIS — E782 Mixed hyperlipidemia: Secondary | ICD-10-CM

## 2023-04-02 DIAGNOSIS — E1169 Type 2 diabetes mellitus with other specified complication: Secondary | ICD-10-CM

## 2023-04-02 DIAGNOSIS — E039 Hypothyroidism, unspecified: Secondary | ICD-10-CM

## 2023-04-02 DIAGNOSIS — E559 Vitamin D deficiency, unspecified: Secondary | ICD-10-CM

## 2023-04-02 DIAGNOSIS — I10 Essential (primary) hypertension: Secondary | ICD-10-CM | POA: Diagnosis not present

## 2023-04-02 DIAGNOSIS — E538 Deficiency of other specified B group vitamins: Secondary | ICD-10-CM

## 2023-04-02 DIAGNOSIS — Z7985 Long-term (current) use of injectable non-insulin antidiabetic drugs: Secondary | ICD-10-CM

## 2023-04-02 DIAGNOSIS — M542 Cervicalgia: Secondary | ICD-10-CM | POA: Diagnosis not present

## 2023-04-02 DIAGNOSIS — F411 Generalized anxiety disorder: Secondary | ICD-10-CM

## 2023-04-02 LAB — COMPREHENSIVE METABOLIC PANEL
ALT: 11 U/L (ref 0–35)
AST: 17 U/L (ref 0–37)
Albumin: 4.2 g/dL (ref 3.5–5.2)
Alkaline Phosphatase: 39 U/L (ref 39–117)
BUN: 14 mg/dL (ref 6–23)
CO2: 26 meq/L (ref 19–32)
Calcium: 9.8 mg/dL (ref 8.4–10.5)
Chloride: 102 meq/L (ref 96–112)
Creatinine, Ser: 0.72 mg/dL (ref 0.40–1.20)
GFR: 76.66 mL/min (ref >60.00)
Glucose, Bld: 140 mg/dL — ABNORMAL HIGH (ref 70–99)
Potassium: 4 meq/L (ref 3.5–5.1)
Sodium: 137 meq/L (ref 135–145)
Total Bilirubin: 0.5 mg/dL (ref 0.2–1.2)
Total Protein: 7.1 g/dL (ref 6.0–8.3)

## 2023-04-02 LAB — LIPID PANEL
Cholesterol: 129 mg/dL (ref 0–200)
HDL: 56 mg/dL (ref >39.00)
LDL Cholesterol: 52 mg/dL (ref 0–99)
NonHDL: 72.99
Total CHOL/HDL Ratio: 2
Triglycerides: 103 mg/dL (ref 0.0–149.0)
VLDL: 20.6 mg/dL (ref 0.0–40.0)

## 2023-04-02 LAB — HEMOGLOBIN A1C: Hgb A1c MFr Bld: 5.8 % (ref 4.6–6.5)

## 2023-04-02 LAB — TSH: TSH: 3.73 u[IU]/mL (ref 0.35–5.50)

## 2023-04-02 MED ORDER — TRULICITY 1.5 MG/0.5ML ~~LOC~~ SOAJ: 1.5000 mg | SUBCUTANEOUS | 2 refills | Status: DC

## 2023-04-02 MED ORDER — LANCET DEVICE MISC: 1.0000 | Freq: Every day | 0 refills | Status: AC

## 2023-04-02 MED ORDER — LANCETS MISC. MISC: 0 refills | Status: AC

## 2023-04-02 MED ORDER — BLOOD GLUCOSE MONITORING SUPPL DEVI: 0 refills | Status: DC

## 2023-04-02 MED ORDER — BLOOD GLUCOSE TEST VI STRP: ORAL_STRIP | 3 refills | Status: DC

## 2023-04-02 NOTE — Assessment & Plan Note (Signed)
Chronic BP well controlled Continue benazepril 20 mg daily cmp  

## 2023-04-02 NOTE — Assessment & Plan Note (Signed)
Chronic Continue vitamin D supplementation

## 2023-04-02 NOTE — Assessment & Plan Note (Signed)
Chronic Check lipid panel  Continue pravastatin 40 mg daily Regular exercise and healthy diet encouraged  

## 2023-04-02 NOTE — Assessment & Plan Note (Addendum)
Chronic Controlled, stable Does have some mild depression and anxiety at times, but overall controlled Continue lexparo 10 mg daily, diazepam 5 mg HS

## 2023-04-02 NOTE — Assessment & Plan Note (Addendum)
Chronic   Lab Results  Component Value Date   HGBA1C 5.9 09/25/2022   Sugars well controlled Check A1c, urine microalbumin Having increased constipation, increased gas- will try lowering trulicity back to 1.5 mg weekly Continue  metformin xr 1000 mg am, 500 mg pm  Stressed regular exercise, diabetic diet, advised making sure she is getting enough protein since she is on Trulicity

## 2023-04-02 NOTE — Assessment & Plan Note (Signed)
Chronic Continue B12 supplementation 

## 2023-04-02 NOTE — Assessment & Plan Note (Signed)
Chronic Following with orthopedics Taking gabapentin 300 mg HS

## 2023-04-02 NOTE — Assessment & Plan Note (Signed)
Chronic  Clinically euthyroid Check tsh and will titrate med dose if needed Currently taking levothyroxine 50 mcg daily 

## 2023-04-03 DIAGNOSIS — Z1231 Encounter for screening mammogram for malignant neoplasm of breast: Secondary | ICD-10-CM | POA: Diagnosis not present

## 2023-04-03 LAB — MICROALBUMIN / CREATININE URINE RATIO
Creatinine,U: 160.1 mg/dL
Microalb Creat Ratio: 8.5 mg/g (ref 0.0–30.0)
Microalb, Ur: 13.6 mg/dL — ABNORMAL HIGH (ref 0.0–1.9)

## 2023-04-03 LAB — HM MAMMOGRAPHY

## 2023-04-05 ENCOUNTER — Encounter: Payer: Self-pay | Admitting: Internal Medicine

## 2023-04-09 ENCOUNTER — Telehealth: Payer: Self-pay | Admitting: Internal Medicine

## 2023-04-09 NOTE — Telephone Encounter (Signed)
Patient said she is completely out of the medication. She would also like to know if a 30 day supply can be sent in.   Prescription Request  04/09/2023  LOV: 04/02/2023  What is the name of the medication or equipment?  diazepam (VALIUM) 5 MG tablet   Have you contacted your pharmacy to request a refill? Yes   Which pharmacy would you like this sent to?  [id number] [place], [place] - [address] AT [facility] OF [facility] [address] [place] [address] Phone: [phone] Fax: [phone]    Patient notified that their request is being sent to the clinical staff for review and that they should receive a response within 2 business days.   Please advise at Mobile [phone] (mobile)

## 2023-04-10 MED ORDER — DIAZEPAM 5 MG PO TABS: 5.0000 mg | ORAL_TABLET | Freq: Every day | ORAL | 5 refills | Status: DC

## 2023-04-15 ENCOUNTER — Other Ambulatory Visit: Payer: Self-pay | Admitting: Internal Medicine

## 2023-04-25 ENCOUNTER — Telehealth: Payer: Self-pay | Admitting: Internal Medicine

## 2023-04-25 NOTE — Telephone Encounter (Signed)
Patient called and said that [facility] needs to speak with Dr. Lawerance Bach concerning patients Trulicity.  Please Call [facility]

## 2023-05-06 NOTE — Telephone Encounter (Signed)
Have tried calling [facility] a few times but the wait time has been over 30 mins.  Will try and call back later.

## 2023-05-07 ENCOUNTER — Telehealth: Payer: Self-pay | Admitting: Internal Medicine

## 2023-05-07 MED ORDER — BLOOD GLUCOSE TEST VI STRP: ORAL_STRIP | 3 refills | Status: DC

## 2023-05-07 MED ORDER — TRULICITY 1.5 MG/0.5ML ~~LOC~~ SOAJ: 1.5000 mg | SUBCUTANEOUS | 2 refills | Status: DC

## 2023-05-07 NOTE — Telephone Encounter (Signed)
Faxed in today.

## 2023-05-07 NOTE — Telephone Encounter (Signed)
Faxed today

## 2023-05-07 NOTE — Telephone Encounter (Signed)
Prescription Request    05/07/2023  LOV: 04/02/2023  What is the name of the medication or equipment? Patient needs accucheck fast clix strips  - patient also needs Trulicity but wants it from [facility] - 90 day supply  Have you contacted your pharmacy to request a refill? Yes   Which pharmacy would you like this sent to?   [id number] [place] Otto, [place] - [address] AT [facility] OF [facility] [address] [place] [address] Phone: [phone] Fax: [phone]     Patient notified that their request is being sent to the clinical staff for review and that they should receive a response within 2 business days.   Please advise at Mobile [phone] (mobile)

## 2023-05-13 ENCOUNTER — Other Ambulatory Visit: Payer: Self-pay | Admitting: Internal Medicine

## 2023-05-16 DIAGNOSIS — M5416 Radiculopathy, lumbar region: Secondary | ICD-10-CM | POA: Diagnosis not present

## 2023-05-16 DIAGNOSIS — M4310 Spondylolisthesis, site unspecified: Secondary | ICD-10-CM | POA: Diagnosis not present

## 2023-05-16 DIAGNOSIS — Z6825 Body mass index (BMI) 25.0-25.9, adult: Secondary | ICD-10-CM | POA: Diagnosis not present

## 2023-05-20 DIAGNOSIS — E119 Type 2 diabetes mellitus without complications: Secondary | ICD-10-CM | POA: Diagnosis not present

## 2023-05-20 DIAGNOSIS — Z0100 Encounter for examination of eyes and vision without abnormal findings: Secondary | ICD-10-CM | POA: Diagnosis not present

## 2023-05-20 LAB — HM DIABETES EYE EXAM

## 2023-06-04 DIAGNOSIS — M5416 Radiculopathy, lumbar region: Secondary | ICD-10-CM | POA: Diagnosis not present

## 2023-06-12 DIAGNOSIS — H35341 Macular cyst, hole, or pseudohole, right eye: Secondary | ICD-10-CM | POA: Diagnosis not present

## 2023-06-12 DIAGNOSIS — H35373 Puckering of macula, bilateral: Secondary | ICD-10-CM | POA: Diagnosis not present

## 2023-06-12 DIAGNOSIS — H43813 Vitreous degeneration, bilateral: Secondary | ICD-10-CM | POA: Diagnosis not present

## 2023-07-02 DIAGNOSIS — M5416 Radiculopathy, lumbar region: Secondary | ICD-10-CM | POA: Diagnosis not present

## 2023-07-04 DIAGNOSIS — R351 Nocturia: Secondary | ICD-10-CM | POA: Diagnosis not present

## 2023-07-04 DIAGNOSIS — N3944 Nocturnal enuresis: Secondary | ICD-10-CM | POA: Diagnosis not present

## 2023-07-04 DIAGNOSIS — N3000 Acute cystitis without hematuria: Secondary | ICD-10-CM | POA: Diagnosis not present

## 2023-07-05 ENCOUNTER — Other Ambulatory Visit: Payer: Self-pay | Admitting: Internal Medicine

## 2023-07-05 ENCOUNTER — Telehealth: Payer: Self-pay | Admitting: Internal Medicine

## 2023-07-05 ENCOUNTER — Other Ambulatory Visit: Payer: Self-pay

## 2023-07-05 MED ORDER — BLOOD GLUCOSE TEST VI STRP: ORAL_STRIP | 3 refills | Status: DC

## 2023-07-05 NOTE — Telephone Encounter (Signed)
Patient called and said she never got the test strips to go with her glucose monitor. She would like to know if Dr. Lawerance Bach can send some to  [facility] [address] [place], [place] - [address]. Best callback is [phone].

## 2023-07-05 NOTE — Telephone Encounter (Signed)
Sent in today 

## 2023-07-11 ENCOUNTER — Telehealth: Payer: Self-pay

## 2023-07-11 ENCOUNTER — Other Ambulatory Visit (HOSPITAL_COMMUNITY): Payer: Self-pay

## 2023-07-11 MED ORDER — FREESTYLE LITE W/DEVICE KIT: PACK | 0 refills | Status: DC

## 2023-07-11 MED ORDER — FREESTYLE LITE TEST VI STRP: ORAL_STRIP | 12 refills | Status: DC

## 2023-07-11 NOTE — Telephone Encounter (Signed)
Rx for new glucometer and test strips [facility]

## 2023-07-11 NOTE — Telephone Encounter (Signed)
Copied from CRM [id number]. Topic: Clinical - Prescription Issue >> Jul 11, 2023  2:12 PM Turkey A wrote: Reason for CRM: Patient called because she is needing a Prior Authorization for her Diabetes Test Strips that she gets from [employer]. Patient provided [employer] number [phone].

## 2023-07-11 NOTE — Telephone Encounter (Signed)
Let her know we need to switch to freestyle lite which is one her insurance will cover

## 2023-07-12 NOTE — Telephone Encounter (Signed)
Spoke with patient today. 

## 2023-07-21 ENCOUNTER — Other Ambulatory Visit: Payer: Self-pay | Admitting: Internal Medicine

## 2023-07-27 ENCOUNTER — Encounter (HOSPITAL_COMMUNITY): Payer: Self-pay | Admitting: Internal Medicine

## 2023-07-27 ENCOUNTER — Emergency Department (HOSPITAL_COMMUNITY): Payer: Medicare HMO

## 2023-07-27 ENCOUNTER — Observation Stay (HOSPITAL_COMMUNITY)
Admission: EM | Admit: 2023-07-27 | Discharge: 2023-07-28 | Disposition: A | Discharge status: Home / Self Care | Payer: Medicare HMO | Attending: Emergency Medicine | Admitting: Emergency Medicine

## 2023-07-27 ENCOUNTER — Other Ambulatory Visit: Payer: Self-pay

## 2023-07-27 DIAGNOSIS — Z7901 Long term (current) use of anticoagulants: Secondary | ICD-10-CM | POA: Insufficient documentation

## 2023-07-27 DIAGNOSIS — E559 Vitamin D deficiency, unspecified: Secondary | ICD-10-CM | POA: Diagnosis not present

## 2023-07-27 DIAGNOSIS — E119 Type 2 diabetes mellitus without complications: Secondary | ICD-10-CM | POA: Diagnosis not present

## 2023-07-27 DIAGNOSIS — E038 Other specified hypothyroidism: Secondary | ICD-10-CM

## 2023-07-27 DIAGNOSIS — Z794 Long term (current) use of insulin: Secondary | ICD-10-CM | POA: Diagnosis not present

## 2023-07-27 DIAGNOSIS — R29810 Facial weakness: Secondary | ICD-10-CM | POA: Diagnosis present

## 2023-07-27 DIAGNOSIS — F1090 Alcohol use, unspecified, uncomplicated: Secondary | ICD-10-CM | POA: Diagnosis not present

## 2023-07-27 DIAGNOSIS — I6930 Unspecified sequelae of cerebral infarction: Secondary | ICD-10-CM

## 2023-07-27 DIAGNOSIS — F411 Generalized anxiety disorder: Secondary | ICD-10-CM | POA: Diagnosis not present

## 2023-07-27 DIAGNOSIS — E785 Hyperlipidemia, unspecified: Secondary | ICD-10-CM | POA: Diagnosis not present

## 2023-07-27 DIAGNOSIS — R4701 Aphasia: Secondary | ICD-10-CM | POA: Diagnosis not present

## 2023-07-27 DIAGNOSIS — E7849 Other hyperlipidemia: Secondary | ICD-10-CM | POA: Diagnosis not present

## 2023-07-27 DIAGNOSIS — R471 Dysarthria and anarthria: Secondary | ICD-10-CM | POA: Diagnosis not present

## 2023-07-27 DIAGNOSIS — D519 Vitamin B12 deficiency anemia, unspecified: Secondary | ICD-10-CM | POA: Insufficient documentation

## 2023-07-27 DIAGNOSIS — F419 Anxiety disorder, unspecified: Secondary | ICD-10-CM | POA: Diagnosis present

## 2023-07-27 DIAGNOSIS — I10 Essential (primary) hypertension: Secondary | ICD-10-CM | POA: Diagnosis present

## 2023-07-27 DIAGNOSIS — E039 Hypothyroidism, unspecified: Secondary | ICD-10-CM | POA: Diagnosis present

## 2023-07-27 DIAGNOSIS — G459 Transient cerebral ischemic attack, unspecified: Secondary | ICD-10-CM | POA: Diagnosis not present

## 2023-07-27 DIAGNOSIS — E1169 Type 2 diabetes mellitus with other specified complication: Secondary | ICD-10-CM | POA: Diagnosis present

## 2023-07-27 DIAGNOSIS — I69392 Facial weakness following cerebral infarction: Secondary | ICD-10-CM

## 2023-07-27 DIAGNOSIS — Z8673 Personal history of transient ischemic attack (TIA), and cerebral infarction without residual deficits: Secondary | ICD-10-CM | POA: Diagnosis not present

## 2023-07-27 DIAGNOSIS — E1159 Type 2 diabetes mellitus with other circulatory complications: Secondary | ICD-10-CM | POA: Diagnosis present

## 2023-07-27 DIAGNOSIS — Z79899 Other long term (current) drug therapy: Secondary | ICD-10-CM | POA: Diagnosis not present

## 2023-07-27 LAB — COMPREHENSIVE METABOLIC PANEL
ALT: 16 U/L (ref 0–44)
AST: 19 U/L (ref 15–41)
Albumin: 3.6 g/dL (ref 3.5–5.0)
Alkaline Phosphatase: 29 U/L — ABNORMAL LOW (ref 38–126)
Anion gap: 10 (ref 5–15)
BUN: 14 mg/dL (ref 8–23)
CO2: 23 mmol/L (ref 22–32)
Calcium: 9.5 mg/dL (ref 8.9–10.3)
Chloride: 105 mmol/L (ref 98–111)
Creatinine, Ser: 0.72 mg/dL (ref 0.44–1.00)
GFR, Estimated: >60 mL/min (ref >60)
Glucose, Bld: 111 mg/dL — ABNORMAL HIGH (ref 70–99)
Potassium: 4 mmol/L (ref 3.5–5.1)
Sodium: 138 mmol/L (ref 135–145)
Total Bilirubin: 0.8 mg/dL (ref 0.0–1.2)
Total Protein: 6.1 g/dL — ABNORMAL LOW (ref 6.5–8.1)

## 2023-07-27 LAB — CBC WITH DIFFERENTIAL/PLATELET
Abs Immature Granulocytes: 0.01 10*3/uL (ref 0.00–0.07)
Basophils Absolute: 0 10*3/uL (ref 0.0–0.1)
Basophils Relative: 0 %
Eosinophils Absolute: 0.2 10*3/uL (ref 0.0–0.5)
Eosinophils Relative: 2 %
HCT: 35.4 % — ABNORMAL LOW (ref 36.0–46.0)
Hemoglobin: 12.3 g/dL (ref 12.0–15.0)
Immature Granulocytes: 0 %
Lymphocytes Relative: 29 %
Lymphs Abs: 2 10*3/uL (ref 0.7–4.0)
MCH: 31.9 pg (ref 26.0–34.0)
MCHC: 34.7 g/dL (ref 30.0–36.0)
MCV: 91.7 fL (ref 80.0–100.0)
Monocytes Absolute: 0.6 10*3/uL (ref 0.1–1.0)
Monocytes Relative: 9 %
Neutro Abs: 4.1 10*3/uL (ref 1.7–7.7)
Neutrophils Relative %: 60 %
Platelets: 209 10*3/uL (ref 150–400)
RBC: 3.86 MIL/uL — ABNORMAL LOW (ref 3.87–5.11)
RDW: 12.4 % (ref 11.5–15.5)
WBC: 6.9 10*3/uL (ref 4.0–10.5)
nRBC: 0 % (ref 0.0–0.2)

## 2023-07-27 MED ORDER — ASPIRIN 81 MG PO CHEW
81.0000 mg | CHEWABLE_TABLET | Freq: Every day | ORAL | Status: DC
  Administered 2023-07-28: 81 mg via ORAL
  Filled 2023-07-27: qty 1

## 2023-07-27 MED ORDER — HYDRALAZINE HCL 20 MG/ML IJ SOLN: 10.0000 mg | Freq: Four times a day (QID) | INTRAMUSCULAR | Status: DC | PRN

## 2023-07-27 MED ORDER — INSULIN ASPART 100 UNIT/ML IJ SOLN: 0.0000 [IU] | Freq: Three times a day (TID) | INTRAMUSCULAR | Status: DC

## 2023-07-27 MED ORDER — VITAMIN D 25 MCG (1000 UNIT) PO TABS: 2000.0000 [IU] | ORAL_TABLET | Freq: Every day | ORAL | Status: DC

## 2023-07-27 MED ORDER — VITAMIN B-12 1000 MCG PO TABS
1000.0000 ug | ORAL_TABLET | Freq: Every morning | ORAL | Status: DC
  Administered 2023-07-28: 1000 ug via ORAL
  Filled 2023-07-27: qty 1

## 2023-07-27 MED ORDER — LEVOTHYROXINE SODIUM 25 MCG PO TABS
50.0000 ug | ORAL_TABLET | Freq: Every day | ORAL | Status: DC
Start: 2023-07-28 — End: 2023-07-28
  Administered 2023-07-28: 50 ug via ORAL
  Filled 2023-07-27: qty 2

## 2023-07-27 MED ORDER — PRAVASTATIN SODIUM 40 MG PO TABS: 40.0000 mg | ORAL_TABLET | Freq: Every day | ORAL | Status: DC

## 2023-07-27 MED ORDER — STROKE: EARLY STAGES OF RECOVERY BOOK
Freq: Once | Status: AC
  Filled 2023-07-27: qty 1

## 2023-07-27 MED ORDER — BENAZEPRIL HCL 20 MG PO TABS: 20.0000 mg | ORAL_TABLET | Freq: Every day | ORAL | Status: DC

## 2023-07-27 MED ORDER — INSULIN ASPART 100 UNIT/ML IJ SOLN: 0.0000 [IU] | Freq: Every day | INTRAMUSCULAR | Status: DC

## 2023-07-27 MED ORDER — ENOXAPARIN SODIUM 30 MG/0.3ML IJ SOSY
30.0000 mg | PREFILLED_SYRINGE | INTRAMUSCULAR | Status: DC
  Administered 2023-07-28: 30 mg via SUBCUTANEOUS
  Filled 2023-07-27: qty 0.3

## 2023-07-27 MED ORDER — ESCITALOPRAM OXALATE 10 MG PO TABS
10.0000 mg | ORAL_TABLET | Freq: Every day | ORAL | Status: DC
  Administered 2023-07-28: 10 mg via ORAL
  Filled 2023-07-27: qty 1

## 2023-07-27 NOTE — ED Provider Notes (Signed)
 11:32 PM Assumed care of patient from off-going team. For more details, please see note from same day.  In brief, this is a 83 y.o. female at 45 PM had garbled speech, which improved. 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 No deficits on exam. CBC/CMP unremarkable.  Plan/Dispo at time of sign-out & ED Course since sign-out: [ ]  MRI  BP (!) 160/69   Pulse 60   Temp 97.6 F (36.4 C) (Oral)   Resp 18   SpO2 100%    ED Course:   Clinical Course as of 07/27/23 2332  Sat Jul 27, 2023  2313 MR BRAIN WO CONTRAST No evidence of acute intracranial abnormality. [HN]  2323 D/w Dr. Jerrie who recommends admission for TIA w/u. Consulted for admission. [HN]    Clinical Course User Index [HN] Franklyn Sid SAILOR, MD   ------------------------------- Sid Franklyn, MD Emergency Medicine  This note was created using dictation software, which may contain spelling or grammatical errors.   Franklyn Sid SAILOR, MD 07/27/23 [phone]

## 2023-07-27 NOTE — ED Provider Notes (Signed)
  Provider Note MRN:  [id number]  Arrival date & time: 07/27/23    ED Course and Medical Decision Making  Assumed care from [facility] at shift change.  See note from prior team for complete details, in brief:  83 yo female with aphasia, now resolved  Labs reviewed Mri neg  Plan per prior physician f/u [facility]  Spoke with sundil, accepted for admit   .Critical Care  Performed by: Elnor Jayson LABOR, DO Authorized by: Elnor Jayson LABOR, DO   Critical care provider statement:    Critical care time (minutes):  30   Critical care time was exclusive of:  Separately billable procedures and treating other patients   Critical care was necessary to treat or prevent imminent or life-threatening deterioration of the following conditions:  CNS failure or compromise   Critical care was time spent personally by me on the following activities:  Development of treatment plan with patient or surrogate, discussions with consultants, evaluation of patient's response to treatment, examination of patient, ordering and review of laboratory studies, ordering and review of radiographic studies, ordering and performing treatments and interventions, pulse oximetry, re-evaluation of patient's condition, review of old charts and obtaining history from patient or surrogate   Care discussed with: admitting provider     Final Clinical Impressions(s) / ED Diagnoses     ICD-10-CM   1. TIA (transient ischemic attack)  G45.9     2. Dysarthria  R47.1       ED Discharge Orders     None       Discharge Instructions   None        Elnor Jayson LABOR, DO 07/27/23 2350

## 2023-07-27 NOTE — H&P (Addendum)
 History and Physical    [name] FMW:[id number] DOB: Jan 16, 1941 DOA: 07/27/2023  PCP: Geofm Glade PARAS, MD   Patient coming from: Home   Chief Complaint: Speech difficulty ED TRIAGE note:Pt BIB EMS from home. Went to sleep 430pm and woke up at 5:50pm with aphasgia lasting about a 1 min.  Hx 13 yrs ago R side facial droop .  EMS VS 180/93, 98% RA, Hr 163, cbg 134  HPI:  [name] is a 83 y.o. female with medical history significant of history of CVA with residual right-sided facial droop, hypothyroidism, DM type II, right-sided sciatica, vitamin D  deficiency, hyperlipidemia, generalized anxiety disorder, chronic lower back pain, and vitamin B12 deficiency presented emergency department with complaining of aphasia lasted for 1 minute. During my evaluation at the bedside patient reported that when she woke up from the sleep found out that she has troubles finding words which lasted for 1 minute.  Patient's daughter and granddaughter at the bedside reported that patient's eyes are closed and she seems confused for a while and family called EMS.  Patient also telling me that she has history of hemorrhagic CVA 13 years ago which caused her right-sided facial droop and right-sided fingertip numbness.  She has been recently placed on aspirin  81 mg by primary care provider but it was not during she had a hemorrhagic CVA. Patient denies any dizziness, headache, tremor, sensory change, speech change, focal weakness generalized weakness seizure and loss of consciousness events.  At presentation to ED patient found to have elevated blood pressure 183/73 which has been improved to 160/69 otherwise hemodynamically stable. CBC unremarkable.  CMP unremarkable. Shows sinus rhythm heart rate 62.  MRI obtained which showed no evidence of acute intracranial abnormality.  ED physician consulted neurology Dr.Bhagat recommended admission for TIA workup.   Significant labs in the ED: Lab Orders          Comprehensive metabolic panel         CBC with Differential         Lipid panel         Comprehensive metabolic panel         CBC       Review of Systems:  Review of Systems  Constitutional:  Negative for chills, fever, malaise/fatigue and weight loss.  Eyes:  Negative for blurred vision.  Cardiovascular:  Negative for chest pain and palpitations.  Musculoskeletal:  Negative for myalgias and neck pain.  Neurological:  Negative for dizziness, tingling, tremors, sensory change, speech change, focal weakness, seizures, loss of consciousness, weakness and headaches.  Psychiatric/Behavioral:  The patient is not nervous/anxious.   All other systems reviewed and are negative.   Past Medical History:  Diagnosis Date   Anxiety    Arthritis HANDS, NECK , BACK   Asymptomatic carotid artery stenosis LEFT --  MOD. PER DR WILLIS NOTE OCT 2012   Benign heart murmur    Echo 2011 in Epic   Depression    PMH of   Diabetes mellitus ORAL MED   DVT (deep venous thrombosis) (HCC) 2006   after arthroscopy   Frequency of urination    Headache    History of CVA (cerebrovascular accident) NEUROLOGIST  DR WILLIS----  06-01-2010-  LEFT BASAL GANGLIA HEMORRAGE   RESIDUAL RIGHT HAND NUMBNESS/TINGLING   History of laparoscopic cholecystectomy 05/24/2021   Hyperlipidemia    Hypertension    Hypothyroid    MRSA (methicillin resistant staph aureus) culture positive    X3; last post TKR  Nocturia    Numbness and tingling in right hand RESIDUAL FROM CVA NOV 2011   Obesity    Redux therapy   Osteoporosis    Stroke [facility])    Thyroid  disease    right thyroid  nodule-- BX DONE 08-15-2011    Past Surgical History:  Procedure Laterality Date   ABDOMINAL HYSTERECTOMY  1973   Endometriosis & fibroid   CARPAL TUNNEL RELEASE  2000   RIGHT   CHOLECYSTECTOMY N/A 05/23/2021   Procedure: LAPAROSCOPIC CHOLECYSTECTOMY;  Surgeon: Vernetta Berg, MD;  Location: [facility];  Service: General;  Laterality: N/A;    COLONOSCOPY  1999 & 2005   Dr Obie   cystoscope     to evaluate recurrent UTIs   CYSTOSCOPY W/ RETROGRADES  08/22/2011   Procedure: CYSTOSCOPY WITH RETROGRADE PYELOGRAM;  Surgeon: Toribio Neysa Repine, MD;  Location: [facility];  Service: Urology;  Laterality: Bilateral;   CYSTOSCOPY WITH BIOPSY  08/22/2011   Procedure: CYSTOSCOPY WITH BIOPSY;  Surgeon: Toribio Neysa Repine, MD;  Location: [facility];  Service: Urology;  Laterality: N/A;   g2 p2     JOINT REPLACEMENT  03-28-2006   LEFT THUMB   KNEE ARTHROSCOPY     BILATERAL PRIOR TO TOTAL KNEE   LEFT SHOULDER SURG  1990'S   LUMBAR LAMINECTOMY  08-26-2009   L 4 - 5   THYROID  NODULE BX  08-15-2011   RIGHT   TOTAL KNEE ARTHROPLASTY  05-26-2002      LEFT   AND RIGHT  03-30-2098   WRIST SURGERY  2014   tendon placed in joint; Dr Sissy     reports that she has never smoked. She has never used smokeless tobacco. She reports current alcohol use. She reports that she does not use drugs.  Allergies  Allergen Reactions   Conjugated Estrogens     REACTION: weight gain  & fibrocystic breast disease changes   Percocet [Oxycodone -Acetaminophen ] Itching    Patient can take regular tylenol  with no problem.   Detrol [Tolterodine]     Rxed by [facility] Excessive drying    Family History  Problem Relation Age of Onset   Bladder Cancer Mother    Diabetes Mother    Brain cancer Sister    Stroke Paternal Grandfather        in 63s   Diabetes Sister 38       TYPE 1    Prostate cancer Brother    Alzheimer's disease Brother    Diabetes Maternal Aunt    Diabetes Maternal Uncle    Diabetes Maternal Grandmother        Type 2   Diabetes Maternal Grandfather        Type 2   Pancreatic cancer Sister    Kidney failure Sister        in context of DM & influenza   Heart disease Neg Hx     Prior to Admission medications   Medication Sig Start Date End Date Taking? Authorizing Provider  Accu-Chek  Softclix Lancets lancets USE AS DIRECTED FOR BLOOD SUGAR ONCE A DAY 07/05/23   Geofm Glade PARAS, MD  aspirin  81 MG chewable tablet Chew 81 mg by mouth at bedtime.    [provider]  aspirin -acetaminophen -caffeine (EXCEDRIN MIGRAINE) 250-250-65 MG tablet Take 1 tablet by mouth 2 (two) times daily as needed for headache.    [provider]  benazepril  (LOTENSIN ) 20 MG tablet TAKE 1 TABLET DAILY 05/13/23   Geofm Glade PARAS, MD  bisacodyl  (DULCOLAX) 10 MG suppository Place 1 suppository (10 mg total) rectally as needed for moderate constipation. 08/10/22   Geofm Glade PARAS, MD  bisacodyl  (DULCOLAX) 5 MG EC tablet Take 1 tablet (5 mg total) by mouth daily as needed for moderate constipation. 08/10/22   Geofm Glade PARAS, MD  blood glucose meter kit and supplies KIT Dispense based on patient and insurance preference. Use up to four times daily as directed. (FOR E11.9). 03/09/20   Geofm Glade PARAS, MD  Blood Glucose Monitoring Suppl (FREESTYLE LITE) w/Device KIT UAD to check sugars once a day.  E11.9 07/11/23   Geofm Glade PARAS, MD  Blood Glucose Monitoring Suppl DEVI UAD to check sugars once daily. E11.9 One touch ultra 2.  May substitute to any manufacturer covered by patient's insurance. 04/02/23   Geofm Glade PARAS, MD  Cholecalciferol (VITAMIN D3) 50 MCG (2000 UT) TABS Take 2,000 Units by mouth at bedtime.    [provider]  Cyanocobalamin  (VITAMIN B-12) 5000 MCG TBDP Take 10,000 mcg by mouth in the morning.    [provider]  diazepam  (VALIUM ) 5 MG tablet Take 1 tablet (5 mg total) by mouth at bedtime. 04/10/23   Geofm Glade PARAS, MD  Dulaglutide  (TRULICITY ) 1.5 MG/0.5ML SOPN Inject 1.5 mg into the skin once a week. 05/07/23   Geofm Glade PARAS, MD  escitalopram  (LEXAPRO ) 10 MG tablet TAKE 1 TABLET DAILY 04/15/23   Geofm Glade PARAS, MD  estradiol (ESTRACE) 0.1 MG/GM vaginal cream Place 1 Applicatorful vaginally every three (3) days as needed (vaginal dryness). 08/26/20   [provider]   gabapentin (NEURONTIN) 300 MG capsule Take 300 mg by mouth 2 (two) times daily. 02/12/22   [provider]  Glucose Blood (BLOOD GLUCOSE TEST STRIPS) STRP UAD to check sugars  One touch ultra 2. E11.9  May substitute to any manufacturer covered by patient's insurance. 07/05/23   Geofm Glade PARAS, MD  glucose blood (FREESTYLE LITE) test strip Use as instructed 07/11/23   Geofm Glade PARAS, MD  Lancets Misc. MISC UAD to check sugars. E11.9   May substitute to any manufacturer covered by patient's insurance. 04/02/23   Geofm Glade PARAS, MD  levothyroxine  (SYNTHROID ) 50 MCG tablet TAKE 1 TABLET(50 MCG) BY MOUTH DAILY 07/22/23   Geofm Glade PARAS, MD  metFORMIN  (GLUCOPHAGE -XR) 500 MG 24 hr tablet TAKE 2 TABLETS WITH BREAKFAST AND 1 TABLET WITH DINNER 01/14/23   Burns, Stacy J, MD  Multiple Vitamin (MULTIVITAMIN WITH MINERALS) TABS tablet Take 1 tablet by mouth daily. One A Day for Women    [provider]  pravastatin  (PRAVACHOL ) 40 MG tablet TAKE 1 TABLET AT BEDTIME 11/26/22   Geofm Glade PARAS, MD     Physical Exam: Vitals:   07/27/23 1930 07/27/23 2215 07/27/23 2311 07/27/23 2312  BP: (!) 180/74 (!) 164/68 (!) 160/69   Pulse: (!) 57  60   Resp: 14 18 18    Temp:   97.6 F (36.4 C)   TempSrc:   Oral   SpO2: 100%  100% 100%    Physical Exam Constitutional:      General: She is not in acute distress.    Appearance: She is not ill-appearing.  HENT:     Mouth/Throat:     Mouth: Mucous membranes are moist.  Eyes:     Pupils: Pupils are equal, round, and reactive to light.  Cardiovascular:     Rate and Rhythm: Normal rate and regular rhythm.     Pulses: Normal pulses.  Heart sounds: Normal heart sounds.  Pulmonary:     Effort: Pulmonary effort is normal.     Breath sounds: Normal breath sounds.  Abdominal:     General: Bowel sounds are normal.  Musculoskeletal:        General: Normal range of motion.     Cervical back: Neck supple.  Skin:    Capillary Refill: Capillary refill  takes less than 2 seconds.  Neurological:     Mental Status: She is alert and oriented to person, place, and time. Mental status is at baseline.     Cranial Nerves: No cranial nerve deficit.     Sensory: No sensory deficit.     Motor: No weakness.     Coordination: Coordination normal.     Deep Tendon Reflexes: Reflexes normal.  Psychiatric:        Mood and Affect: Mood normal.        Thought Content: Thought content normal.      Labs on Admission: I have personally reviewed following labs and imaging studies  CBC: Recent Labs  Lab 07/27/23 1920  WBC 6.9  NEUTROABS 4.1  HGB 12.3  HCT 35.4*  MCV 91.7  PLT 209   Basic Metabolic Panel: Recent Labs  Lab 07/27/23 1920  NA 138  K 4.0  CL 105  CO2 23  GLUCOSE 111*  BUN 14  CREATININE 0.72  CALCIUM 9.5   GFR: CrCl cannot be calculated (Unknown ideal weight.). Liver Function Tests: Recent Labs  Lab 07/27/23 1920  AST 19  ALT 16  ALKPHOS 29*  BILITOT 0.8  PROT 6.1*  ALBUMIN 3.6   No results for input(s): LIPASE, AMYLASE in the last 168 hours. No results for input(s): AMMONIA in the last 168 hours. Coagulation Profile: No results for input(s): INR, PROTIME in the last 168 hours. Cardiac Enzymes: No results for input(s): CKTOTAL, CKMB, CKMBINDEX, TROPONINI, TROPONINIHS in the last 168 hours. BNP (last 3 results) No results for input(s): BNP in the last 8760 hours. HbA1C: No results for input(s): HGBA1C in the last 72 hours. CBG: No results for input(s): GLUCAP in the last 168 hours. Lipid Profile: No results for input(s): CHOL, HDL, LDLCALC, TRIG, CHOLHDL, LDLDIRECT in the last 72 hours. Thyroid  Function Tests: No results for input(s): TSH, T4TOTAL, FREET4, T3FREE, THYROIDAB in the last 72 hours. Anemia Panel: No results for input(s): VITAMINB12, FOLATE, FERRITIN, TIBC, IRON, RETICCTPCT in the last 72 hours. Urine analysis:    Component Value  Date/Time   COLORURINE YELLOW 07/19/2021 1610   APPEARANCEUR CLOUDY (A) 07/19/2021 1610   LABSPEC >=1.030 07/19/2021 1610   PHURINE 5.5 07/19/2021 1610   GLUCOSEU NEGATIVE 07/19/2021 1610   GLUCOSEU >=1000 (A) 01/13/2020 1135   HGBUR TRACE (A) 07/19/2021 1610   HGBUR trace-lysed 08/08/2010 0836   BILIRUBINUR NEGATIVE 07/19/2021 1610   BILIRUBINUR neg 03/05/2017 1432   KETONESUR NEGATIVE 07/19/2021 1610   PROTEINUR NEGATIVE 07/19/2021 1610   UROBILINOGEN 0.2 01/13/2020 1135   NITRITE NEGATIVE 07/19/2021 1610   LEUKOCYTESUR LARGE (A) 07/19/2021 1610    Radiological Exams on Admission: I have personally reviewed images MR BRAIN WO CONTRAST Result Date: 07/27/2023 CLINICAL DATA:  Transient ischemic attack (TIA) EXAM: MRI HEAD WITHOUT CONTRAST TECHNIQUE: Multiplanar, multiecho pulse sequences of the brain and surrounding structures were obtained without intravenous contrast. COMPARISON:  CT head 02/17/2021. FINDINGS: Brain: No acute infarction, acute hemorrhage, hydrocephalus, extra-axial collection or mass lesion. Susceptibility artifact in the left frontoparietal white matter, compatible with prior hemorrhage. Vascular: Major arterial  flow voids are maintained. Skull and upper cervical spine: Normal marrow signal. Sinuses/Orbits: Clear sinuses.  No acute orbital findings. IMPRESSION: No evidence of acute intracranial abnormality. Electronically Signed   By: Gilmore GORMAN Molt M.D.   On: 07/27/2023 22:57     EKG: My personal interpretation of EKG shows: EKG shows sinus rhythm heart rate 62.    Assessment/Plan: Principal Problem:   TIA (transient ischemic attack) Active Problems:   CVA, old, facial weakness   Non-insulin  dependent type 2 diabetes mellitus (HCC)   Hyperlipidemia   Essential hypertension   GAD (generalized anxiety disorder)   Hypothyroidism    Assessment and Plan: Transient ischemic attack Aphasia-resolved History of CVA with residual right-sided facial droop >  Patient presented to emergency department complaining of 1 minute of aphasia.  Patient reported she went to sleep at 4:30 PM and when she woke up at 7:50 PM noticed garbled speech which improved after 1 minute.  Patient has history of previous CVA with right-sided facial droop. -At presentation to ED patient found to have elevated blood pressure 183/73 otherwise hemodynamically stable.  CBC and CMP unremarkable.  EKG showed normal sinus rhythm heart rate 62. -MRI brain no evidence of acute intracranial abnormality. - ED physician consulted neurology Dr. Jerrie recommended medicine admission for TIA workup. - Plan to continue home aspirin  81 mg daily and Lipitor 40 mg daily. - Will await for further neurology recommendation to start Plavix  or not. - Obtaining bilateral carotid ultrasound and echocardiogram. - Continue neurocheck every 4 hours. -Continue permissive hypertension blood pressure below 210/120 for next 24 hours, as needed hydralazine  on board.  Holding home benazepril . - Checking lipid panel and A1c level. -Continue cardiac monitoring. - Will do stroke swallow screen before starting oral medication and diet, PT and OT evaluation. - Continue cardiac monitoring. Addendum: - Neurology has been evaluated patient recommended Plavix  load 300 mg followed by 75 mg daily for 21-90-day course and continue aspirin  81 mg daily.  Recommended to obtain MRI brain, CTA head and neck.  Normotensive given MRI negative. -Changing permissive hypertension to make sure that patient remains normotensive.  Resuming benazepril  from tonight and changed parameter for hydralazine  to treat hypertension. -On discharge patient needs 30 days event monitor.   Non-insulin -dependent DM type II -A1c 5.8.  Holding metformin  and semaglutide . - Continue low sliding scale insulin  and at bedtime insulin  as needed coverage with meal.  Hyperlipidemia -Continue Lipitor.  Hypertension -At presentation to ED patient found  to have elevated blood pressure 183/73 which has been improved to 160/69. -Maintain permissive hypertension blood pressure below 210/120 for next 24 hours, as needed hydralazine  on board.     History of generalized anxiety disorder -Continue Valium  5 mg at bedtime and Lexapro  10 mg daily.  Hypothyroidism -Continue levothyroxine   Vitamin D  deficiency -Continue vitamin D  supplement  B12 deficiency -Continue to B12 supplement   DVT prophylaxis:  Lovenox  Code Status:  Full Code Diet: Heart healthy carb modified Family Communication: None present Disposition Plan: Pending carotid ultrasound and echocardiogram.  Tentative discharge to home next 1 to 2 days Consults: Neurology, PT and OT Admission status:   Observation, Telemetry bed  Severity of Illness: The appropriate patient status for this patient is OBSERVATION. Observation status is judged to be reasonable and necessary in order to provide the required intensity of service to ensure the patient's safety. The patient's presenting symptoms, physical exam findings, and initial radiographic and laboratory data in the context of their medical condition is felt to place  them at decreased risk for further clinical deterioration. Furthermore, it is anticipated that the patient will be medically stable for discharge from the hospital within 2 midnights of admission.     Marcie Shearon, MD [facility]  How to contact the [facility] Attending or Consulting provider 7A - 7P or covering provider during after hours 7P -7A, for this patient.  Check the care team in [facility] and look for a) attending/consulting [facility] provider listed and b) the [facility] team listed Log into [email or web address] and use [place]'s universal password to access. If you do not have the password, please contact the hospital operator. Locate the [facility] provider you are looking for under [facility] and page to a number that you can be directly reached. If you still have  difficulty reaching the provider, please page the [facility] (Director on Call) for the Hospitalists listed on amion for assistance.  07/28/2023, 12:25 AM

## 2023-07-27 NOTE — ED Triage Notes (Signed)
 Pt BIB EMS from home. Went to sleep 430pm and woke up at 5:50pm with aphasgia lasting about a 1 min.   Hx 13 yrs ago R side facial droop   EMS VS 180/93, 98% RA, Hr 163, cbg 134

## 2023-07-27 NOTE — ED Provider Notes (Signed)
 [place] EMERGENCY DEPARTMENT AT [facility] Provider Note  CSN: [id number] Arrival date & time: 07/27/23 1849  Chief Complaint(s) No chief complaint on file.  HPI [name] is a 83 y.o. female here today for an episode of difficulty with speech production which occurred at about 5:50 PM.  Patient reportedly was trying talk to family, and was saying words that were not making any sense.  Symptoms lasted for about 1 minute.  Patient has a history of a prior CVA that resulted in residual slight right sided facial droop.   Past Medical History Past Medical History:  Diagnosis Date   Anxiety    Arthritis HANDS, NECK , BACK   Asymptomatic carotid artery stenosis LEFT --  MOD. PER DR WILLIS NOTE OCT 2012   Benign heart murmur    Echo 2011 in Epic   Depression    PMH of   Diabetes mellitus ORAL MED   DVT (deep venous thrombosis) (HCC) 2006   after arthroscopy   Frequency of urination    Headache    History of CVA (cerebrovascular accident) NEUROLOGIST  DR WILLIS----  06-01-2010-  LEFT BASAL GANGLIA HEMORRAGE   RESIDUAL RIGHT HAND NUMBNESS/TINGLING   History of laparoscopic cholecystectomy 05/24/2021   Hyperlipidemia    Hypertension    Hypothyroid    MRSA (methicillin resistant staph aureus) culture positive    X3; last post TKR   Nocturia    Numbness and tingling in right hand RESIDUAL FROM CVA NOV 2011   Obesity    Redux therapy   Osteoporosis    Stroke (HCC)    Thyroid  disease    right thyroid  nodule-- BX DONE 08-15-2011   Patient Active Problem List   Diagnosis Date Noted   Inverted nipple 09/25/2022   Constipation 08/09/2022   Sciatica of right side 03/27/2022   Vitamin B12 deficiency 03/16/2021   Low back pain 10/29/2019   Pain in left wrist 10/01/2019   Cervicalgia 07/29/2019   Trigger finger, left middle finger 04/28/2018   Hypothyroidism 03/29/2017   Fecal incontinence 09/25/2016   Generalized anxiety disorder 06/20/2015   SUI (stress urinary  incontinence, female) 02/05/2014   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 Authorizing Provider  Accu-Chek Softclix Lancets lancets USE AS DIRECTED FOR BLOOD SUGAR ONCE A DAY 07/05/23   Geofm Glade PARAS, MD  aspirin  81 MG chewable tablet Chew 81 mg by mouth at bedtime.    [provider]  aspirin -acetaminophen -caffeine (EXCEDRIN MIGRAINE) 250-250-65 MG tablet Take 1 tablet by mouth 2 (two) times daily as needed for headache.    [provider]  benazepril  (LOTENSIN ) 20 MG tablet TAKE 1 TABLET DAILY 05/13/23   Burns, Glade PARAS, MD  bisacodyl  (DULCOLAX) 10 MG suppository Place 1 suppository (10 mg total) rectally as needed for moderate constipation. 08/10/22   Geofm Glade PARAS, MD  bisacodyl  (DULCOLAX) 5 MG EC tablet Take 1 tablet (5 mg total) by mouth daily as needed for moderate constipation. 08/10/22   Geofm Glade PARAS, MD  blood glucose meter kit and supplies KIT Dispense based on patient and insurance preference. Use up to  four times daily as directed. (FOR E11.9). 03/09/20   Geofm Glade PARAS, MD  Blood Glucose Monitoring Suppl (FREESTYLE LITE) w/Device KIT UAD to check sugars once a day.  E11.9 07/11/23   Geofm Glade PARAS, MD  Blood Glucose Monitoring Suppl DEVI UAD to check sugars once daily. E11.9 One touch ultra 2.  May substitute to any manufacturer covered by patient's insurance. 04/02/23   Geofm Glade PARAS, MD  Cholecalciferol (VITAMIN D3) 50 MCG (2000 UT) TABS Take 2,000 Units by mouth at bedtime.    [provider]  Cyanocobalamin   (VITAMIN B-12) 5000 MCG TBDP Take 10,000 mcg by mouth in the morning.    [provider]  diazepam  (VALIUM ) 5 MG tablet Take 1 tablet (5 mg total) by mouth at bedtime. 04/10/23   Geofm Glade PARAS, MD  Dulaglutide  (TRULICITY ) 1.5 MG/0.5ML SOPN Inject 1.5 mg into the skin once a week. 05/07/23   Geofm Glade PARAS, MD  escitalopram  (LEXAPRO ) 10 MG tablet TAKE 1 TABLET DAILY 04/15/23   Geofm Glade PARAS, MD  estradiol (ESTRACE) 0.1 MG/GM vaginal cream Place 1 Applicatorful vaginally every three (3) days as needed (vaginal dryness). 08/26/20   [provider]  gabapentin (NEURONTIN) 300 MG capsule Take 300 mg by mouth 2 (two) times daily. 02/12/22   [provider]  Glucose Blood (BLOOD GLUCOSE TEST STRIPS) STRP UAD to check sugars  One touch ultra 2. E11.9  May substitute to any manufacturer covered by patient's insurance. 07/05/23   Geofm Glade PARAS, MD  glucose blood (FREESTYLE LITE) test strip Use as instructed 07/11/23   Geofm Glade PARAS, MD  Lancets Misc. MISC UAD to check sugars. E11.9   May substitute to any manufacturer covered by patient's insurance. 04/02/23   Geofm Glade PARAS, MD  levothyroxine  (SYNTHROID ) 50 MCG tablet TAKE 1 TABLET(50 MCG) BY MOUTH DAILY 07/22/23   Geofm Glade PARAS, MD  metFORMIN  (GLUCOPHAGE -XR) 500 MG 24 hr tablet TAKE 2 TABLETS WITH BREAKFAST AND 1 TABLET WITH DINNER 01/14/23   Burns, Stacy J, MD  Multiple Vitamin (MULTIVITAMIN WITH MINERALS) TABS tablet Take 1 tablet by mouth daily. One A Day for Women    [provider]  pravastatin  (PRAVACHOL ) 40 MG tablet TAKE 1 TABLET AT BEDTIME 11/26/22   Geofm Glade PARAS, MD                                                                                                                                    Past Surgical History Past Surgical History:  Procedure Laterality Date   ABDOMINAL HYSTERECTOMY  1973   Endometriosis & fibroid   CARPAL TUNNEL RELEASE  2000   RIGHT   CHOLECYSTECTOMY N/A 05/23/2021   Procedure:  LAPAROSCOPIC CHOLECYSTECTOMY;  Surgeon: Vernetta Berg, MD;  Location: [facility];  Service: General;  Laterality: N/A;   COLONOSCOPY  1999 & 2005   Dr Obie   cystoscope     to evaluate  recurrent UTIs   CYSTOSCOPY W/ RETROGRADES  08/22/2011   Procedure: CYSTOSCOPY WITH RETROGRADE PYELOGRAM;  Surgeon: Toribio Neysa Repine, MD;  Location: [facility];  Service: Urology;  Laterality: Bilateral;   CYSTOSCOPY WITH BIOPSY  08/22/2011   Procedure: CYSTOSCOPY WITH BIOPSY;  Surgeon: Toribio Neysa Repine, MD;  Location: [facility];  Service: Urology;  Laterality: N/A;   g2 p2     JOINT REPLACEMENT  03-28-2006   LEFT THUMB   KNEE ARTHROSCOPY     BILATERAL PRIOR TO TOTAL KNEE   LEFT SHOULDER SURG  1990'S   LUMBAR LAMINECTOMY  08-26-2009   L 4 - 5   THYROID  NODULE BX  08-15-2011   RIGHT   TOTAL KNEE ARTHROPLASTY  05-26-2002      LEFT   AND RIGHT  03-30-2098   WRIST SURGERY  2014   tendon placed in joint; Dr Sissy   Family History Family History  Problem Relation Age of Onset   Bladder Cancer Mother    Diabetes Mother    Brain cancer Sister    Stroke Paternal Grandfather        in 12s   Diabetes Sister 19       TYPE 1    Prostate cancer Brother    Alzheimer's disease Brother    Diabetes Maternal Aunt    Diabetes Maternal Uncle    Diabetes Maternal Grandmother        Type 2   Diabetes Maternal Grandfather        Type 2   Pancreatic cancer Sister    Kidney failure Sister        in context of DM & influenza   Heart disease Neg Hx     Social History Social History   Tobacco Use   Smoking status: Never   Smokeless tobacco: Never  Vaping Use   Vaping status: Never Used  Substance Use Topics   Alcohol use: Yes    Comment:  very rarely , < 1 glass wine / month   Drug use: No   Allergies Conjugated estrogens, Percocet [oxycodone -acetaminophen ], and Detrol [tolterodine]  Review of Systems Review of Systems  Physical Exam Vital Signs  I  have reviewed the triage vital signs BP (!) 180/74   Pulse (!) 57   Temp (!) 97.4 F (36.3 C) (Oral)   Resp 14   SpO2 100%   Physical Exam Vitals reviewed.  HENT:     Head: Normocephalic and atraumatic.     Nose: Nose normal.  Eyes:     Pupils: Pupils are equal, round, and reactive to light.  Cardiovascular:     Rate and Rhythm: Normal rate.  Pulmonary:     Effort: Pulmonary effort is normal.  Abdominal:     General: Abdomen is flat.     Palpations: Abdomen is soft.  Skin:    General: Skin is warm and dry.  Neurological:     Mental Status: She is alert.     Comments: Slight right-sided facial droop which is normal for the patient, and is seen in her epic picture.  5-5 strength grip bilaterally, intact finger-to-nose, intact heel-to-shin.  5-5 strength in bilateral lower extremities and upper extremities.  Cranial nerves otherwise intact.     ED Results and Treatments Labs (all labs ordered are listed, but only abnormal results are displayed) Labs Reviewed  COMPREHENSIVE METABOLIC PANEL - Abnormal; Notable for the following components:      Result Value   Glucose, Bld  111 (*)    Total Protein 6.1 (*)    Alkaline Phosphatase 29 (*)    All other components within normal limits  CBC WITH DIFFERENTIAL/PLATELET - Abnormal; Notable for the following components:   RBC 3.86 (*)    HCT 35.4 (*)    All other components within normal limits                                                                                                                          Radiology No results found.  Pertinent labs & imaging results that were available during my care of the patient were reviewed by me and considered in my medical decision making (see MDM for details).  Medications Ordered in ED Medications - No data to display                                                                                                                                    Procedures Procedures  (including critical care time)  Medical Decision Making / ED Course   This patient presents to the ED for concern of brief period of difficulty with word production, this involves an extensive number of treatment options, and is a complaint that carries with it a high risk of complications and morbidity.  The differential diagnosis includes TIA, CVA, less likely infectious process, less likely metabolic abnormalities.  MDM: Patient's symptoms are concerning for TIA.  At this time, patient has no new neurological deficits.  Will obtain MRI on the patient.  Will check basic labs.  Patient was signed out to Dr. Franklyn pending MRI and disposition.   Additional history obtained: -Additional history obtained from EMS -External records from outside source obtained and reviewed including: Chart review including previous notes, labs, imaging, consultation notes   Lab Tests: -I ordered, reviewed, and interpreted labs.   The pertinent results include:   Labs Reviewed  COMPREHENSIVE METABOLIC PANEL - Abnormal; Notable for the following components:      Result Value   Glucose, Bld 111 (*)    Total Protein 6.1 (*)    Alkaline Phosphatase 29 (*)    All other components within normal limits  CBC WITH DIFFERENTIAL/PLATELET - Abnormal; Notable for the following components:   RBC 3.86 (*)    HCT 35.4 (*)    All other components within normal limits      Imaging Studies ordered: I  ordered imaging studies including MRI of the brain I independently visualized and interpreted imaging. I agree with the radiologist interpretation   Medicines ordered and prescription drug management: No orders of the defined types were placed in this encounter.   -I have reviewed the patients home medicines and have made adjustments as needed  Cardiac Monitoring: The patient was maintained on a cardiac monitor.  I personally viewed and interpreted the cardiac monitored which showed an  underlying rhythm of: Normal sinus rhythm  Social Determinants of Health:  Factors impacting patients care include:    Reevaluation: After the interventions noted above, I reevaluated the patient and found that they have :improved  Co morbidities that complicate the patient evaluation  Past Medical History:  Diagnosis Date   Anxiety    Arthritis HANDS, NECK , BACK   Asymptomatic carotid artery stenosis LEFT --  MOD. PER DR WILLIS NOTE OCT 2012   Benign heart murmur    Echo 2011 in Epic   Depression    PMH of   Diabetes mellitus ORAL MED   DVT (deep venous thrombosis) (HCC) 2006   after arthroscopy   Frequency of urination    Headache    History of CVA (cerebrovascular accident) NEUROLOGIST  DR WILLIS----  06-01-2010-  LEFT BASAL GANGLIA HEMORRAGE   RESIDUAL RIGHT HAND NUMBNESS/TINGLING   History of laparoscopic cholecystectomy 05/24/2021   Hyperlipidemia    Hypertension    Hypothyroid    MRSA (methicillin resistant staph aureus) culture positive    X3; last post TKR   Nocturia    Numbness and tingling in right hand RESIDUAL FROM CVA NOV 2011   Obesity    Redux therapy   Osteoporosis    Stroke [facility])    Thyroid  disease    right thyroid  nodule-- BX DONE 08-15-2011      Dispostion: Signed out to Dr. Paulino     Final Clinical Impression(s) / ED Diagnoses Final diagnoses:  Dysphasia     @PCDICTATION @    Mannie Pac T, DO 07/27/23 2058

## 2023-07-28 ENCOUNTER — Observation Stay (HOSPITAL_COMMUNITY): Payer: Medicare HMO

## 2023-07-28 ENCOUNTER — Observation Stay (HOSPITAL_BASED_OUTPATIENT_CLINIC_OR_DEPARTMENT_OTHER): Payer: Medicare HMO

## 2023-07-28 DIAGNOSIS — G459 Transient cerebral ischemic attack, unspecified: Secondary | ICD-10-CM | POA: Diagnosis not present

## 2023-07-28 DIAGNOSIS — I6522 Occlusion and stenosis of left carotid artery: Secondary | ICD-10-CM | POA: Diagnosis not present

## 2023-07-28 LAB — URINALYSIS, ROUTINE W REFLEX MICROSCOPIC
Bacteria, UA: NONE SEEN
Bilirubin Urine: NEGATIVE
Glucose, UA: NEGATIVE mg/dL
Hgb urine dipstick: NEGATIVE
Ketones, ur: NEGATIVE mg/dL
Nitrite: NEGATIVE
Protein, ur: NEGATIVE mg/dL
Specific Gravity, Urine: 1.012 (ref 1.005–1.030)
pH: 6 (ref 5.0–8.0)

## 2023-07-28 LAB — CBG MONITORING, ED: Glucose-Capillary: 130 mg/dL — ABNORMAL HIGH (ref 70–99)

## 2023-07-28 LAB — LIPID PANEL
Cholesterol: 132 mg/dL (ref 0–200)
HDL: 57 mg/dL (ref >40)
LDL Cholesterol: 52 mg/dL (ref 0–99)
Total CHOL/HDL Ratio: 2.3 {ratio}
Triglycerides: 116 mg/dL (ref <150)
VLDL: 23 mg/dL (ref 0–40)

## 2023-07-28 LAB — COMPREHENSIVE METABOLIC PANEL
ALT: 16 U/L (ref 0–44)
AST: 19 U/L (ref 15–41)
Albumin: 3.5 g/dL (ref 3.5–5.0)
Alkaline Phosphatase: 30 U/L — ABNORMAL LOW (ref 38–126)
Anion gap: 11 (ref 5–15)
BUN: 13 mg/dL (ref 8–23)
CO2: 22 mmol/L (ref 22–32)
Calcium: 9.1 mg/dL (ref 8.9–10.3)
Chloride: 105 mmol/L (ref 98–111)
Creatinine, Ser: 0.66 mg/dL (ref 0.44–1.00)
GFR, Estimated: >60 mL/min (ref >60)
Glucose, Bld: 136 mg/dL — ABNORMAL HIGH (ref 70–99)
Potassium: 3.8 mmol/L (ref 3.5–5.1)
Sodium: 138 mmol/L (ref 135–145)
Total Bilirubin: 0.6 mg/dL (ref 0.0–1.2)
Total Protein: 5.7 g/dL — ABNORMAL LOW (ref 6.5–8.1)

## 2023-07-28 LAB — CBC
HCT: 34.7 % — ABNORMAL LOW (ref 36.0–46.0)
Hemoglobin: 12.2 g/dL (ref 12.0–15.0)
MCH: 31.8 pg (ref 26.0–34.0)
MCHC: 35.2 g/dL (ref 30.0–36.0)
MCV: 90.4 fL (ref 80.0–100.0)
Platelets: 208 10*3/uL (ref 150–400)
RBC: 3.84 MIL/uL — ABNORMAL LOW (ref 3.87–5.11)
RDW: 12.4 % (ref 11.5–15.5)
WBC: 6.6 10*3/uL (ref 4.0–10.5)
nRBC: 0 % (ref 0.0–0.2)

## 2023-07-28 LAB — ECHOCARDIOGRAM COMPLETE
AR max vel: 2.05 cm2
AV Area VTI: 1.9 cm2
AV Area mean vel: 1.86 cm2
AV Mean grad: 2 mm[Hg]
AV Peak grad: 3.5 mm[Hg]
Ao pk vel: 0.93 m/s
Area-P 1/2: 2.67 cm2
S' Lateral: 2.5 cm

## 2023-07-28 LAB — RAPID URINE DRUG SCREEN, HOSP PERFORMED
Amphetamines: NOT DETECTED
Barbiturates: NOT DETECTED
Benzodiazepines: POSITIVE — AB
Cocaine: NOT DETECTED
Opiates: NOT DETECTED
Tetrahydrocannabinol: NOT DETECTED

## 2023-07-28 MED ORDER — BENAZEPRIL HCL 20 MG PO TABS: 20.0000 mg | ORAL_TABLET | Freq: Every day | ORAL | Status: DC

## 2023-07-28 MED ORDER — DIAZEPAM 5 MG PO TABS
5.0000 mg | ORAL_TABLET | Freq: Every day | ORAL | Status: DC
  Administered 2023-07-28: 5 mg via ORAL
  Filled 2023-07-28: qty 1

## 2023-07-28 MED ORDER — BENAZEPRIL HCL 20 MG PO TABS
20.0000 mg | ORAL_TABLET | Freq: Every day | ORAL | Status: DC
  Administered 2023-07-28: 20 mg via ORAL
  Filled 2023-07-28: qty 1

## 2023-07-28 MED ORDER — CLOPIDOGREL BISULFATE 300 MG PO TABS
300.0000 mg | ORAL_TABLET | Freq: Once | ORAL | Status: AC
  Administered 2023-07-28: 300 mg via ORAL
  Filled 2023-07-28: qty 1

## 2023-07-28 MED ORDER — HYDRALAZINE HCL 20 MG/ML IJ SOLN: 10.0000 mg | Freq: Four times a day (QID) | INTRAMUSCULAR | Status: DC | PRN

## 2023-07-28 MED ORDER — CLOPIDOGREL BISULFATE 75 MG PO TABS: 75.0000 mg | ORAL_TABLET | Freq: Every day | ORAL | 1 refills | Status: DC

## 2023-07-28 MED ORDER — ASPIRIN 81 MG PO CHEW: 81.0000 mg | CHEWABLE_TABLET | Freq: Every day | ORAL | 0 refills | Status: DC

## 2023-07-28 MED ORDER — CLOPIDOGREL BISULFATE 75 MG PO TABS: 75.0000 mg | ORAL_TABLET | Freq: Every day | ORAL | Status: DC

## 2023-07-28 MED ORDER — IOHEXOL 350 MG/ML SOLN
75.0000 mL | Freq: Once | INTRAVENOUS | Status: AC | PRN
  Administered 2023-07-28: 75 mL via INTRAVENOUS

## 2023-07-28 NOTE — Care Management Obs Status (Signed)
 MEDICARE OBSERVATION STATUS NOTIFICATION   Patient Details  Name: [name] MRN: [id number] Date of Birth: 22-May-1941   Medicare Observation Status Notification Given:  Yes    Lockie Pares, RN 07/28/2023, 10:07 AM

## 2023-07-28 NOTE — ED Notes (Signed)
 ED Provider at bedside.

## 2023-07-28 NOTE — ED Notes (Signed)
 Patient transported to CT

## 2023-07-28 NOTE — Discharge Summary (Signed)
 Physician Discharge Summary  Patient ID: [name] MRN: [id number] DOB/AGE: [id number] 85 y.o.  Admit date: 07/27/2023 Discharge date: 07/28/2023  Admission Diagnoses:  Discharge Diagnoses:  Principal Problem:   TIA (transient ischemic attack) Active Problems:   Non-insulin  dependent type 2 diabetes mellitus (HCC)   Hyperlipidemia   Essential hypertension   GAD (generalized anxiety disorder)   Hypothyroidism   CVA, old, facial weakness   Discharged Condition: stable  Hospital Course: Patient is an 83 year old Caucasian female, with past medical history significant for diabetes mellitus, hypertension, hyperlipidemia, prior stroke with residual right facial droop.  Patient was admitted with transient episode of expressive aphasia and right facial numbness. Symptoms resolved by the time EMS arrived.  Patient has been on aspirin  81 Mg p.o. once daily, but reportedly has been out for several weeks.  Patient was admitted for further assessment and management.  Neurology team directed patient's care.  MRI of the brain came back negative.  Echocardiogram is nonrevealing.  CTA chest and neck with and without contrast result is as documented below.  Patient will be managed with aspirin  and Plavix  for 3 weeks, and then continue on only Plavix .  Neurology team has cleared patient for discharge.  Patient will be discharged back home to the care of the primary care provider.  Patient will follow-up with primary care provider and neurology team within 1 week of discharge.  TIA vs. Transition period from sleep to fully awake    MRI no acute infarct CTA head & neck no LVO, moderate left ICA siphon stenosis 2D Echo EF 55-60% LDL 52 Patient was on aspirin  81 mg daily prior to admission.  Plavix  was added on admission.  Patient will continue aspirin  and Plavix  for 3 weeks, then only Plavix  afterwards.     Hx of Stroke/TIA Per pt, in 2022 she had right right sided numbness. She had back pain at that time  so did not make a bid deal. Later she saw her PCP and had CT done, was told still has some brain bleed. She later followed with Dr. Jenel at [facility] for questionable carotid stenosis but later determined no carotid stenosis. Current MRI showed susceptibility artifact in the left frontoparietal white matter, compatible with prior hemorrhage.    Hypertension Home meds:  none Stable Long term BP goal normotensive    Hyperlipidemia Home meds:  pravastatin  40, resumed in hospital LDL 52, goal < 70 High intensity statin not indicated due to LDL at goal Continue statin at discharge   Diabetes type II Controlled Home meds:  trulicity  HgbA1c pending, goal < 7.0 CBGs SSI Recommend close follow-up with PCP for better DM control   Other Stroke Risk Factors 85   Other Active Problems DJD Anxiety     Transient ischemic attack Aphasia-resolved History of CVA with residual right-sided facial droop > Patient presented to emergency department complaining of 1 minute of aphasia.  Patient reported she went to sleep at 4:30 PM and when she woke up at 7:50 PM noticed garbled speech which improved after 1 minute.  Patient has history of previous CVA with right-sided facial droop. -At presentation to ED patient found to have elevated blood pressure 183/73 otherwise hemodynamically stable.  CBC and CMP unremarkable.  EKG showed normal sinus rhythm heart rate 62. -MRI brain no evidence of acute intracranial abnormality. - ED physician consulted neurology Dr. Jerrie recommended medicine admission for TIA workup. - Plan to continue home aspirin  81 mg daily and Lipitor 40 mg daily. -  Will await for further neurology recommendation to start Plavix  or not. - Obtaining bilateral carotid ultrasound and echocardiogram. - Continue neurocheck every 4 hours. -Continue permissive hypertension blood pressure below 210/120 for next 24 hours, as needed hydralazine  on board.  Holding home benazepril . -  Checking lipid panel and A1c level. -Continue cardiac monitoring. - Will do stroke swallow screen before starting oral medication and diet, PT and OT evaluation. - Continue cardiac monitoring. Addendum: - Neurology has been evaluated patient recommended Plavix  load 300 mg followed by 75 mg daily for 21-90-day course and continue aspirin  81 mg daily.  Recommended to obtain MRI brain, CTA head and neck.  Normotensive given MRI negative. -Changing permissive hypertension to make sure that patient remains normotensive.  Resuming benazepril  from tonight and changed parameter for hydralazine  to treat hypertension. -On discharge patient needs 30 days event monitor.     Non-insulin -dependent DM type II -A1c 5.8.  Holding metformin  and semaglutide . - Continue low sliding scale insulin  and at bedtime insulin  as needed coverage with meal.   Hyperlipidemia -Continue Lipitor.   Hypertension -At presentation to ED patient found to have elevated blood pressure 183/73 which has been improved to 160/69. -Maintain permissive hypertension blood pressure below 210/120 for next 24 hours, as needed hydralazine  on board.       History of generalized anxiety disorder -Continue Valium  5 mg at bedtime and Lexapro  10 mg daily.   Hypothyroidism -Continue levothyroxine    Vitamin D  deficiency -Continue vitamin D  supplement   B12 deficiency -Continue to B12 supplement        Consults: neurology  Significant Diagnostic Studies:  MRI HEAD WITHOUT CONTRAST   TECHNIQUE: Multiplanar, multiecho pulse sequences of the brain and surrounding structures were obtained without intravenous contrast.   COMPARISON:  CT head 02/17/2021.   FINDINGS: Brain: No acute infarction, acute hemorrhage, hydrocephalus, extra-axial collection or mass lesion. Susceptibility artifact in the left frontoparietal white matter, compatible with prior hemorrhage.   Vascular: Major arterial flow voids are maintained.   Skull  and upper cervical spine: Normal marrow signal.   Sinuses/Orbits: Clear sinuses.  No acute orbital findings.   IMPRESSION: No evidence of acute intracranial abnormality.     Electronically Signed   By: Gilmore GORMAN Molt M.D.   On: 07/27/2023 22:57    CT ANGIOGRAPHY HEAD AND NECK WITH AND WITHOUT CONTRAST   TECHNIQUE: Multidetector CT imaging of the head and neck was performed using the standard protocol during bolus administration of intravenous contrast. Multiplanar CT image reconstructions and MIPs were obtained to evaluate the vascular anatomy. Carotid stenosis measurements (when applicable) are obtained utilizing NASCET criteria, using the distal internal carotid diameter as the denominator.   RADIATION DOSE REDUCTION: This exam was performed according to the departmental dose-optimization program which includes automated exposure control, adjustment of the mA and/or kV according to patient size and/or use of iterative reconstruction technique.   CONTRAST:  75mL OMNIPAQUE  IOHEXOL  350 MG/ML SOLN   COMPARISON:  CT head 02/17/2021.  MRI head 07/27/2023.   FINDINGS: CT HEAD FINDINGS   Brain: No evidence of acute large vascular territory infarction, hemorrhage, hydrocephalus, extra-axial collection or mass lesion/mass effect.   Vascular: Detailed below.   Skull: No acute fracture.   Sinuses/Orbits: Clear sinuses.  No acute orbital findings.   Other: No mastoid effusions.   Review of the MIP images confirms the above findings   CTA NECK FINDINGS   Aortic arch: Great vessel origins are patent without significant stenosis.   Right carotid system: No  evidence of dissection, stenosis (50% or greater), or occlusion.   Left carotid system: No evidence of dissection, stenosis (50% or greater), or occlusion.   Vertebral arteries: Codominant. No evidence of dissection, stenosis (50% or greater), or occlusion.   Skeleton: Multilevel degenerative change. No  evidence of acute abnormality on limited assessment.   Other neck: No evidence of acute abnormality on limited assessment.   Upper chest: Visualized lung apices are clear.   Review of the MIP images confirms the above findings   CTA HEAD FINDINGS   Anterior circulation: Bilateral intracranial ICAs, MCAs, and ACAs are patent. Moderate left paraclinoid ICA stenosis. No aneurysm identified.   Posterior circulation: Bilateral intradural vertebral arteries, basilar artery, and bilateral posterior cerebral arteries are patent without proximal hemodynamically significant stenosis. No aneurysm identified.   Venous sinuses: As permitted by contrast timing, patent.   Review of the MIP images confirms the above findings   IMPRESSION: 1. No emergent large vessel occlusion. 2. Moderate left paraclinoid ICA stenosis.     Electronically Signed   By: Gilmore GORMAN Molt M.D.   On: 07/28/2023 01:22  Labs:  LDL was 52.     Treatments: Continue aspirin  and Plavix  for 3 weeks, and then only Plavix  afterwards.  Discharge Exam: Blood pressure (!) 115/98, pulse 61, temperature 97.9 F (36.6 C), resp. rate 18, SpO2 97%.   Disposition: Discharge disposition: 01-Home or Self Care       Discharge Instructions     Ambulatory referral to Neurology   Complete by: As directed    Follow up with stroke clinic NP (Jessica Vanschaick or Elveria Lunger, if both not available, consider Sethi, Penumali, or Ahern) at [facility] in about 4 weeks. Thanks.   Diet - low sodium heart healthy   Complete by: As directed    Increase activity slowly   Complete by: As directed       Allergies as of 07/28/2023       Reactions   Conjugated Estrogens    REACTION: weight gain  & fibrocystic breast disease changes   Percocet [oxycodone -acetaminophen ] Itching   Patient can take regular tylenol  with no problem.   Detrol [tolterodine]    Rxed by [facility] Excessive drying        Medication List      STOP taking these medications    cephALEXin 250 MG capsule Commonly known as: KEFLEX   CRANBERRY PO       TAKE these medications    Accu-Chek Softclix Lancets lancets USE AS DIRECTED FOR BLOOD SUGAR ONCE A DAY   acetaminophen  500 MG tablet Commonly known as: TYLENOL  Take 1,000 mg by mouth every morning.   aspirin  81 MG chewable tablet Chew 1 tablet (81 mg total) by mouth at bedtime.   benazepril  20 MG tablet Commonly known as: LOTENSIN  TAKE 1 TABLET DAILY   blood glucose meter kit and supplies Kit Dispense based on patient and insurance preference. Use up to four times daily as directed. (FOR E11.9).   Blood Glucose Monitoring Suppl Devi UAD to check sugars once daily. E11.9 One touch ultra 2.  May substitute to any manufacturer covered by patient's insurance.   FreeStyle Lite w/Device Kit UAD to check sugars once a day.  E11.9   BLOOD GLUCOSE TEST STRIPS Strp UAD to check sugars  One touch ultra 2. E11.9  May substitute to any manufacturer covered by patient's insurance.   FREESTYLE LITE test strip Generic drug: glucose blood Use as instructed   clopidogrel  75 MG  tablet Commonly known as: PLAVIX  Take 1 tablet (75 mg total) by mouth daily. Start taking on: July 29, 2023   diazepam  5 MG tablet Commonly known as: VALIUM  Take 1 tablet (5 mg total) by mouth at bedtime.   escitalopram  10 MG tablet Commonly known as: LEXAPRO  TAKE 1 TABLET DAILY   estradiol 0.1 MG/GM vaginal cream Commonly known as: ESTRACE Place 1 Applicatorful vaginally every three (3) days as needed (vaginal dryness).   gabapentin 300 MG capsule Commonly known as: NEURONTIN Take 300 mg by mouth 2 (two) times daily.   Lancets Misc. Misc UAD to check sugars. E11.9   May substitute to any manufacturer covered by patient's insurance.   levothyroxine  50 MCG tablet Commonly known as: SYNTHROID  TAKE 1 TABLET(50 MCG) BY MOUTH DAILY   metFORMIN  500 MG 24 hr tablet Commonly known as:  GLUCOPHAGE -XR TAKE 2 TABLETS WITH BREAKFAST AND 1 TABLET WITH DINNER   multivitamin with minerals Tabs tablet Take 1 tablet by mouth daily. One A Day for Women   pravastatin  40 MG tablet Commonly known as: PRAVACHOL  TAKE 1 TABLET AT BEDTIME   Trulicity  1.5 MG/0.5ML Soaj Generic drug: Dulaglutide  Inject 1.5 mg into the skin once a week.   Vitamin B-12 5000 MCG Tbdp Take 5,000 mcg by mouth in the morning.   Vitamin D3 50 MCG (2000 UT) Tabs Take 2,000 Units by mouth at bedtime.        Follow-up Information     [facility]. Schedule an appointment as soon as possible for a visit in 1 month(s).   Specialty: Neurology Why: stroke clinic Contact information: [address] [place]  [address] [phone]                Time spent: 35 minutes.  SignedBETHA Leatrice LILLETTE Rosario 07/28/2023, 2:53 PM

## 2023-07-28 NOTE — Consult Note (Signed)
 NEUROLOGY CONSULT NOTE   Date of service: July 28, 2023 Patient Name: [name] MRN:  [id number] DOB:  07/02/1941 Chief Complaint: garbled speech, right face numbness Requesting Provider: Lee Kingfisher, MD  History of Present Illness  [name] is a 83 y.o. female with a past medical history significant for hypertension, hyperlipidemia, diabetes, prior stroke with residual right facial weakness and mild sensory loss in the right fingertips, degenerative disc disease of the C and L-spine, anxiety and depression  She reports she had lay down for a nap.  On awakening she tried to get called out to her daughter and granddaughter but could not get her words out.  Garbled gibberish came out instead of words.  In an attempt to get their attention she called louder and louder until they came into the room.  She reports they found her with her eyes closed, she was aware of their presence but still could not get her words out.  She heard them call 911.  Her right face also felt numb to her which has resolved.  By the time of EMS arrival her symptoms had resolved.  However she was quite hypertensive on arrival to the ED 180/93, blood pressures are normally very well-controlled  She notes she has been out of her baby aspirin  for several weeks and has been forgetting to pick up a new bottle, otherwise no recent medication changes  No other recent complaints or concerns, no prior episodes of aphasia  LKW: 4:30 PM Modified rankin score: 1-No significant post stroke disability and can perform usual duties with stroke symptoms IV Thrombolysis: No, symptoms resolved EVT: No, exam not consistent with LVO   NIHSS components Score: Comment  1a Level of Conscious 0[x]  1[]  2[]  3[]      1b LOC Questions 0[x]  1[]  2[]       1c LOC Commands 0[x]  1[]  2[]       2 Best Gaze 0[x]  1[]  2[]       3 Visual 0[x]  1[]  2[]  3[]      4 Facial Palsy 0[]  1[x]  2[]  3[]    Residual right facial droop  5a Motor Arm - left  0[x]  1[]  2[]  3[]  4[]  UN[]    5b Motor Arm - Right 0[x]  1[]  2[]  3[]  4[]  UN[]    6a Motor Leg - Left 0[x]  1[]  2[]  3[]  4[]  UN[]    6b Motor Leg - Right 0[x]  1[]  2[]  3[]  4[]  UN[]    7 Limb Ataxia 0[x]  1[]  2[]  3[]  UN[]     8 Sensory 0[x]  1[]  2[]  UN[]    Minor residual right fingertip sensory loss not scored  9 Best Language 0[x]  1[]  2[]  3[]      10 Dysarthria 0[x]  1[]  2[]  UN[]      11 Extinct. and Inattention 0[x]  1[]  2[]       TOTAL:       ROS  Comprehensive ROS performed and pertinent positives documented in HPI   Past History   Past Medical History:  Diagnosis Date   Anxiety    Arthritis HANDS, NECK , BACK   Asymptomatic carotid artery stenosis LEFT --  MOD. PER DR WILLIS NOTE OCT 2012   Benign heart murmur    Echo 2011 in Epic   Depression    PMH of   Diabetes mellitus ORAL MED   DVT (deep venous thrombosis) (HCC) 2006   after arthroscopy   Frequency of urination    Headache    History of CVA (cerebrovascular accident) NEUROLOGIST  DR WILLIS----  06-01-2010-  LEFT BASAL GANGLIA HEMORRAGE  RESIDUAL RIGHT HAND NUMBNESS/TINGLING   History of laparoscopic cholecystectomy 05/24/2021   Hyperlipidemia    Hypertension    Hypothyroid    MRSA (methicillin resistant staph aureus) culture positive    X3; last post TKR   Nocturia    Numbness and tingling in right hand RESIDUAL FROM CVA NOV 2011   Obesity    Redux therapy   Osteoporosis    Stroke [facility])    Thyroid  disease    right thyroid  nodule-- BX DONE 08-15-2011    Past Surgical History:  Procedure Laterality Date   ABDOMINAL HYSTERECTOMY  1973   Endometriosis & fibroid   CARPAL TUNNEL RELEASE  2000   RIGHT   CHOLECYSTECTOMY N/A 05/23/2021   Procedure: LAPAROSCOPIC CHOLECYSTECTOMY;  Surgeon: Vernetta Berg, MD;  Location: [facility];  Service: General;  Laterality: N/A;   COLONOSCOPY  1999 & 2005   Dr Obie   cystoscope     to evaluate recurrent UTIs   CYSTOSCOPY W/ RETROGRADES  08/22/2011   Procedure: CYSTOSCOPY WITH  RETROGRADE PYELOGRAM;  Surgeon: Toribio Neysa Repine, MD;  Location: [facility];  Service: Urology;  Laterality: Bilateral;   CYSTOSCOPY WITH BIOPSY  08/22/2011   Procedure: CYSTOSCOPY WITH BIOPSY;  Surgeon: Toribio Neysa Repine, MD;  Location: [facility];  Service: Urology;  Laterality: N/A;   g2 p2     JOINT REPLACEMENT  03-28-2006   LEFT THUMB   KNEE ARTHROSCOPY     BILATERAL PRIOR TO TOTAL KNEE   LEFT SHOULDER SURG  1990'S   LUMBAR LAMINECTOMY  08-26-2009   L 4 - 5   THYROID  NODULE BX  08-15-2011   RIGHT   TOTAL KNEE ARTHROPLASTY  05-26-2002      LEFT   AND RIGHT  03-30-2098   WRIST SURGERY  2014   tendon placed in joint; Dr Sissy    Family History: Family History  Problem Relation Age of Onset   Bladder Cancer Mother    Diabetes Mother    Brain cancer Sister    Stroke Paternal Grandfather        in 46s   Diabetes Sister 63       TYPE 1    Prostate cancer Brother    Alzheimer's disease Brother    Diabetes Maternal Aunt    Diabetes Maternal Uncle    Diabetes Maternal Grandmother        Type 2   Diabetes Maternal Grandfather        Type 2   Pancreatic cancer Sister    Kidney failure Sister        in context of DM & influenza   Heart disease Neg Hx     Social History  reports that she has never smoked. She has never used smokeless tobacco. She reports current alcohol use. She reports that she does not use drugs.  Allergies  Allergen Reactions   Conjugated Estrogens     REACTION: weight gain  & fibrocystic breast disease changes   Percocet [Oxycodone -Acetaminophen ] Itching    Patient can take regular tylenol  with no problem.   Detrol [Tolterodine]     Rxed by [facility] Excessive drying    Medications   Current Facility-Administered Medications:     stroke: early stages of recovery book, , Does not apply, Once, Sundil, Subrina, MD   aspirin  chewable tablet 81 mg, 81 mg, Oral, QHS, Sundil, Subrina, MD, 81 mg at  07/28/23 0024   cholecalciferol (VITAMIN D3) 25 MCG (1000 UNIT) tablet  2,000 Units, 2,000 Units, Oral, QHS, Sundil, Subrina, MD   cyanocobalamin  (VITAMIN B12) tablet 1,000 mcg, 1,000 mcg, Oral, q AM, Sundil, Subrina, MD   diazepam  (VALIUM ) tablet 5 mg, 5 mg, Oral, QHS, Sundil, Subrina, MD, 5 mg at 07/28/23 0030   enoxaparin  (LOVENOX ) injection 30 mg, 30 mg, Subcutaneous, Q24H, Sundil, Subrina, MD   escitalopram  (LEXAPRO ) tablet 10 mg, 10 mg, Oral, Daily, Sundil, Subrina, MD   hydrALAZINE  (APRESOLINE ) injection 10 mg, 10 mg, Intravenous, Q6H PRN, Sundil, Subrina, MD   insulin  aspart (novoLOG ) injection 0-5 Units, 0-5 Units, Subcutaneous, QHS, Sundil, Subrina, MD   insulin  aspart (novoLOG ) injection 0-6 Units, 0-6 Units, Subcutaneous, TID WC, Sundil, Subrina, MD   levothyroxine  (SYNTHROID ) tablet 50 mcg, 50 mcg, Oral, Q0600, Sundil, Subrina, MD   pravastatin  (PRAVACHOL ) tablet 40 mg, 40 mg, Oral, QHS, Sundil, Subrina, MD  Current Outpatient Medications:    acetaminophen  (TYLENOL ) 500 MG tablet, Take 1,000 mg by mouth every morning., Disp: , Rfl:    aspirin  81 MG chewable tablet, Chew 81 mg by mouth at bedtime., Disp: , Rfl:    benazepril  (LOTENSIN ) 20 MG tablet, TAKE 1 TABLET DAILY, Disp: 90 tablet, Rfl: 3   cephALEXin (KEFLEX) 250 MG capsule, Take 250 mg by mouth at bedtime., Disp: , Rfl:    Cholecalciferol (VITAMIN D3) 50 MCG (2000 UT) TABS, Take 2,000 Units by mouth at bedtime., Disp: , Rfl:    CRANBERRY PO, Take 1 tablet by mouth daily., Disp: , Rfl:    Cyanocobalamin  (VITAMIN B-12) 5000 MCG TBDP, Take 5,000 mcg by mouth in the morning., Disp: , Rfl:    diazepam  (VALIUM ) 5 MG tablet, Take 1 tablet (5 mg total) by mouth at bedtime., Disp: 30 tablet, Rfl: 5   Dulaglutide  (TRULICITY ) 1.5 MG/0.5ML SOPN, Inject 1.5 mg into the skin once a week., Disp: 6 mL, Rfl: 2   escitalopram  (LEXAPRO ) 10 MG tablet, TAKE 1 TABLET DAILY, Disp: 90 tablet, Rfl: 3   estradiol (ESTRACE) 0.1 MG/GM vaginal cream,  Place 1 Applicatorful vaginally every three (3) days as needed (vaginal dryness)., Disp: , Rfl:    gabapentin (NEURONTIN) 300 MG capsule, Take 300 mg by mouth 2 (two) times daily., Disp: , Rfl:    levothyroxine  (SYNTHROID ) 50 MCG tablet, TAKE 1 TABLET(50 MCG) BY MOUTH DAILY, Disp: 90 tablet, Rfl: 3   metFORMIN  (GLUCOPHAGE -XR) 500 MG 24 hr tablet, TAKE 2 TABLETS WITH BREAKFAST AND 1 TABLET WITH DINNER, Disp: 270 tablet, Rfl: 2   Multiple Vitamin (MULTIVITAMIN WITH MINERALS) TABS tablet, Take 1 tablet by mouth daily. One A Day for Women, Disp: , Rfl:    pravastatin  (PRAVACHOL ) 40 MG tablet, TAKE 1 TABLET AT BEDTIME, Disp: 90 tablet, Rfl: 3   Accu-Chek Softclix Lancets lancets, USE AS DIRECTED FOR BLOOD SUGAR ONCE A DAY, Disp: 100 each, Rfl: 2   blood glucose meter kit and supplies KIT, Dispense based on patient and insurance preference. Use up to four times daily as directed. (FOR E11.9)., Disp: 1 each, Rfl: 0   Blood Glucose Monitoring Suppl (FREESTYLE LITE) w/Device KIT, UAD to check sugars once a day.  E11.9, Disp: 1 kit, Rfl: 0   Blood Glucose Monitoring Suppl DEVI, UAD to check sugars once daily. E11.9 One touch ultra 2.  May substitute to any manufacturer covered by patient's insurance., Disp: 1 each, Rfl: 0   Glucose Blood (BLOOD GLUCOSE TEST STRIPS) STRP, UAD to check sugars  One touch ultra 2. E11.9  May substitute to any manufacturer covered by patient's  insurance., Disp: 100 strip, Rfl: 3   glucose blood (FREESTYLE LITE) test strip, Use as instructed, Disp: 100 each, Rfl: 12   Lancets Misc. MISC, UAD to check sugars. E11.9   May substitute to any manufacturer covered by patient's insurance., Disp: 100 each, Rfl: 0  Vitals   Vitals:   08/11/23 2215 August 11, 2023 2311 11-Aug-2023 2312 07/28/23 0026  BP: (!) 164/68 (!) 160/69    Pulse:  60    Resp: 18 18    Temp:  97.6 F (36.4 C)  98 F (36.7 C)  TempSrc:  Oral  Oral  SpO2:  100% 100%     There is no height or weight on file to calculate  BMI.  Physical Exam   Constitutional: Appears well-developed and well-nourished.  Psych: Affect appropriate to situation.  Eyes: No scleral injection.  HENT: No OP obstruction.  Head: Normocephalic.  Cardiovascular: Normal rate and regular rhythm.  Respiratory: Effort normal, non-labored breathing.  GI: Soft.  No distension. There is no tenderness.  Skin: Visible skin intact, dry  Neurologic Examination   Neuro: Mental Status: Patient is awake, alert, oriented to person, place, month, year, and situation. Patient is able to give a clear and coherent history. No signs of aphasia or neglect speech at baseline per patient but she has some minor hesitations at times Cranial Nerves: II: Visual Fields are full. Pupils are equal, round, and reactive to light.   III,IV, VI: EOMI without ptosis or diploplia.  V: Facial sensation is symmetric to temperature VII: Facial movement is symmetric.  VIII: hearing is intact to voice X: Uvula elevates symmetrically XI: Shoulder shrug is symmetric. XII: tongue is midline without atrophy or fasciculations.  Motor: Tone is normal. Bulk is normal. 5/5 strength was present in all four extremities.  No drift Sensory: Sensation is symmetric to light touch and temperature in the arms and legs. Deep Tendon Reflexes: 2+ and symmetric in the brachioradialis and patellae.  Plantars: Toes are downgoing bilaterally.  Cerebellar: FNF and HKS are intact bilaterally  NIHSS total  Score breakdown:    Labs/Imaging/Neurodiagnostic studies   CBC:  Recent Labs  Lab Aug 11, 2023 1920  WBC 6.9  NEUTROABS 4.1  HGB 12.3  HCT 35.4*  MCV 91.7  PLT 209   Basic Metabolic Panel:  Lab Results  Component Value Date   NA 138 11-Aug-2023   K 4.0 Aug 11, 2023   CO2 23 11-Aug-2023   GLUCOSE 111 (H) 08/11/2023   BUN 14 Aug 11, 2023   CREATININE 0.72 2023/08/11   CALCIUM 9.5 2023-08-11   GFRNONAA >60 2023/08/11   GFRAA 97 03/09/2020   Lipid Panel:  Lab Results   Component Value Date   LDLCALC 52 04/02/2023   HgbA1c:  Lab Results  Component Value Date   HGBA1C 5.8 04/02/2023   Urine Drug Screen: No results found for: LABOPIA, COCAINSCRNUR, LABBENZ, AMPHETMU, THCU, LABBARB  Alcohol Level No results found for: [facility] INR  Lab Results  Component Value Date   INR 1.8 (H) 04/02/2008   APTT  Lab Results  Component Value Date   APTT 31 03/24/2008   AED levels: No results found for: PHENYTOIN, ZONISAMIDE, LAMOTRIGINE, LEVETIRACETA   MRI Brain(Personally reviewed): No acute intracranial process   ASSESSMENT   NAIOMY WATTERS is a 83 y.o. woman with vascular risk factors of diabetes, hypertension, hyperlipidemia, prior stroke  Given word salad speech described by patient as well as a right facial numbness which resolved, event is concerning for TIA, less likely focal seizure given  single event; prior stroke was subcortical and would not be expected to be a seizure focus  RECOMMENDATIONS   # Left MCA TIA  - Stroke labs HgbA1c, fasting lipid panel, UA, UDS  - MRI brain  - CTA head and neck  - Frequent neuro checks - Echocardiogram  - Continue aspirin  81 mg daily - Plavix  300 mg load with 75 mg daily for 21 - 90 day course (ordered) - Risk factor modification - Telemetry monitoring; 30 day event monitor on discharge if no arrythmias captured  - Blood pressure goal   - Normotension given MRI negative - PT consult, OT consult, Speech consult, not indicated as patient is back to baseline and MRI negative for stroke - Stroke team to follow ______________________________________________________________________    Bonney Lola Jernigan MD-PhD [facility] [phone] Available 7 PM to 7 AM, outside of these hours please call Neurologist on call as listed on Amion.

## 2023-07-28 NOTE — Progress Notes (Addendum)
 STROKE TEAM PROGRESS NOTE   BRIEF HPI Ms. [name] is a 83 y.o. female with history of DM, HTN, HLD, prior stroke with residual right facial droop presenting 1/4 with transient episode of expressive aphasia and right facial numbness. Symptoms resolved by EMS arrival. Patient takes baby aspirin  each day, but reportedly has been out for several weeks.  LKW 1630 1/4 mRs: 1 IVT: No, symptoms resolved EVT: No, no LVO  SIGNIFICANT HOSPITAL EVENTS   INTERIM HISTORY/SUBJECTIVE No family at the bedside. Pt lying in bed, stated that she was tired yesterday and had a nap for over an hour. She had vivid dreams too but on wakening up, she wanted to say something but getting voice out. She finally made sounds so that her granddaughter and granddaughter's boyfriend came by, saw her eyes tightly closed, mumbling words, they called EMS. On EMS arrival, she was awake, able to talk to them near normal.    OBJECTIVE  CBC    Component Value Date/Time   WBC 6.6 07/28/2023 0459   RBC 3.84 (L) 07/28/2023 0459   HGB 12.2 07/28/2023 0459   HCT 34.7 (L) 07/28/2023 0459   PLT 208 07/28/2023 0459   MCV 90.4 07/28/2023 0459   MCV 93.5 07/22/2013 1447   MCH 31.8 07/28/2023 0459   MCHC 35.2 07/28/2023 0459   RDW 12.4 07/28/2023 0459   LYMPHSABS 2.0 07/27/2023 1920   MONOABS 0.6 07/27/2023 1920   EOSABS 0.2 07/27/2023 1920   BASOSABS 0.0 07/27/2023 1920    BMET    Component Value Date/Time   NA 138 07/28/2023 0459   K 3.8 07/28/2023 0459   CL 105 07/28/2023 0459   CO2 22 07/28/2023 0459   GLUCOSE 136 (H) 07/28/2023 0459   BUN 13 07/28/2023 0459   CREATININE 0.66 07/28/2023 0459   CREATININE 0.65 03/09/2020 1443   CALCIUM 9.1 07/28/2023 0459   GFRNONAA >60 07/28/2023 0459   GFRNONAA 83 03/09/2020 1443    IMAGING past 24 hours CT ANGIO HEAD NECK W WO CM Result Date: 07/28/2023 CLINICAL DATA:  Stroke/TIA, determine embolic source EXAM: CT ANGIOGRAPHY HEAD AND NECK WITH AND WITHOUT CONTRAST  TECHNIQUE: Multidetector CT imaging of the head and neck was performed using the standard protocol during bolus administration of intravenous contrast. Multiplanar CT image reconstructions and MIPs were obtained to evaluate the vascular anatomy. Carotid stenosis measurements (when applicable) are obtained utilizing NASCET criteria, using the distal internal carotid diameter as the denominator. RADIATION DOSE REDUCTION: This exam was performed according to the departmental dose-optimization program which includes automated exposure control, adjustment of the mA and/or kV according to patient size and/or use of iterative reconstruction technique. CONTRAST:  75mL OMNIPAQUE  IOHEXOL  350 MG/ML SOLN COMPARISON:  CT head 02/17/2021.  MRI head 07/27/2023. FINDINGS: CT HEAD FINDINGS Brain: No evidence of acute large vascular territory infarction, hemorrhage, hydrocephalus, extra-axial collection or mass lesion/mass effect. Vascular: Detailed below. Skull: No acute fracture. Sinuses/Orbits: Clear sinuses.  No acute orbital findings. Other: No mastoid effusions. Review of the MIP images confirms the above findings CTA NECK FINDINGS Aortic arch: Great vessel origins are patent without significant stenosis. Right carotid system: No evidence of dissection, stenosis (50% or greater), or occlusion. Left carotid system: No evidence of dissection, stenosis (50% or greater), or occlusion. Vertebral arteries: Codominant. No evidence of dissection, stenosis (50% or greater), or occlusion. Skeleton: Multilevel degenerative change. No evidence of acute abnormality on limited assessment. Other neck: No evidence of acute abnormality on limited assessment. Upper chest: Visualized  lung apices are clear. Review of the MIP images confirms the above findings CTA HEAD FINDINGS Anterior circulation: Bilateral intracranial ICAs, MCAs, and ACAs are patent. Moderate left paraclinoid ICA stenosis. No aneurysm identified. Posterior circulation:  Bilateral intradural vertebral arteries, basilar artery, and bilateral posterior cerebral arteries are patent without proximal hemodynamically significant stenosis. No aneurysm identified. Venous sinuses: As permitted by contrast timing, patent. Review of the MIP images confirms the above findings IMPRESSION: 1. No emergent large vessel occlusion. 2. Moderate left paraclinoid ICA stenosis. Electronically Signed   By: Gilmore GORMAN Molt M.D.   On: 07/28/2023 01:22   MR BRAIN WO CONTRAST Result Date: 07/27/2023 CLINICAL DATA:  Transient ischemic attack (TIA) EXAM: MRI HEAD WITHOUT CONTRAST TECHNIQUE: Multiplanar, multiecho pulse sequences of the brain and surrounding structures were obtained without intravenous contrast. COMPARISON:  CT head 02/17/2021. FINDINGS: Brain: No acute infarction, acute hemorrhage, hydrocephalus, extra-axial collection or mass lesion. Susceptibility artifact in the left frontoparietal white matter, compatible with prior hemorrhage. Vascular: Major arterial flow voids are maintained. Skull and upper cervical spine: Normal marrow signal. Sinuses/Orbits: Clear sinuses.  No acute orbital findings. IMPRESSION: No evidence of acute intracranial abnormality. Electronically Signed   By: Gilmore GORMAN Molt M.D.   On: 07/27/2023 22:57    Vitals:   07/28/23 0745 07/28/23 0803 07/28/23 0815 07/28/23 0830  BP: (!) 129/92   115/73  Pulse:      Resp: (!) 21  14   Temp:  97.7 F (36.5 C)    TempSrc:  Oral    SpO2:   100%      PHYSICAL EXAM General:  Alert, well-nourished, well-developed patient in no acute distress Psych:  Mood and affect appropriate for situation CV: Regular rate and rhythm on monitor Respiratory:  Regular, unlabored respirations on room air GI: Abdomen soft and nontender   NEURO:  Mental Status: AA&Ox3, patient is able to give clear and coherent history Speech/Language: speech is without dysarthria or aphasia.  Naming, repetition, fluency, and comprehension  intact.  Cranial Nerves:  II: PERRL. Visual fields full.  III, IV, VI: EOMI. Eyelids elevate symmetrically.  V: Sensation is intact to light touch and symmetrical to face.  VII: mild right facial droop, chronic VIII: hearing intact to voice. IX, X: Palate elevates symmetrically. Phonation is normal.  KP:Dynloizm shrug 5/5. XII: tongue is midline without fasciculations. Motor: 5/5 strength to all muscle groups tested.  Tone: is normal and bulk is normal Sensation- Intact to light touch bilaterally except right fingertips decreased light touch sensation, chronic. Extinction absent to light touch to DSS.   Coordination: FTN intact bilaterally, HKS: no ataxia in BLE.No drift.  Gait- deferred  Most Recent NIH 0   ASSESSMENT/PLAN  TIA vs. Transition period from sleep to fully awake    MRI no acute infarct CTA head & neck no LVO, moderate left ICA siphon stenosis 2D Echo EF 55-60% LDL 52 HgbA1c pending VTE prophylaxis - lovenox  aspirin  81 mg daily prior to admission, now on aspirin  81 mg daily and clopidogrel  75 mg daily for 3 weeks and then plavix  alone. Therapy recommendations: none Disposition:  likely home today  Hx of Stroke/TIA Per pt, in 2022 she had right right sided numbness. She had back pain at that time so did not make a bid deal. Later she saw her PCP and had CT done, was told still has some brain bleed. She later followed with Dr. Jenel at [facility] for questionable carotid stenosis but later determined no carotid stenosis. Current MRI showed  susceptibility artifact in the left frontoparietal white matter, compatible with prior hemorrhage.   Hypertension Home meds:  none Stable Long term BP goal normotensive   Hyperlipidemia Home meds:  pravastatin  40, resumed in hospital LDL 52, goal < 70 High intensity statin not indicated due to LDL at goal Continue statin at discharge  Diabetes type II Controlled Home meds:  trulicity  HgbA1c pending, goal <  7.0 CBGs SSI Recommend close follow-up with PCP for better DM control  Other Stroke Risk Factors Advanced age  Other Active Problems DJD anxiety  Hospital day # 0  Neurology will sign off. Please call with questions. Pt will follow up with stroke clinic NP at [facility] in about 4 weeks. Thanks for the consult.  Ary Cummins, MD PhD Stroke Neurology 07/28/2023 1:36 PM  I spent additional inpatient face-to-face time with the patient, more than 50% of which was spent in counseling and coordination of care, reviewing test results, images and medication, and discussing the diagnosis, treatment plan and potential prognosis. This patient's care requiresreview of multiple databases, neurological assessment, discussion with family, other specialists and medical decision making of high complexity.    To contact Stroke Continuity provider, please refer to [email or web address]. After hours, contact General Neurology

## 2023-07-29 LAB — HEMOGLOBIN A1C
Hgb A1c MFr Bld: 6.7 % — ABNORMAL HIGH (ref 4.8–5.6)
Mean Plasma Glucose: 146 mg/dL

## 2023-07-29 LAB — CBG MONITORING, ED: Glucose-Capillary: 114 mg/dL — ABNORMAL HIGH (ref 70–99)

## 2023-08-22 ENCOUNTER — Encounter: Payer: Self-pay | Admitting: Neurology

## 2023-08-22 ENCOUNTER — Ambulatory Visit (INDEPENDENT_AMBULATORY_CARE_PROVIDER_SITE_OTHER): Payer: Medicare HMO | Admitting: Neurology

## 2023-08-22 VITALS — BP 130/78 | HR 72 | Ht 66.0 in | Wt 163.0 lb

## 2023-08-22 DIAGNOSIS — Z7985 Long-term (current) use of injectable non-insulin antidiabetic drugs: Secondary | ICD-10-CM

## 2023-08-22 DIAGNOSIS — I10 Essential (primary) hypertension: Secondary | ICD-10-CM

## 2023-08-22 DIAGNOSIS — E119 Type 2 diabetes mellitus without complications: Secondary | ICD-10-CM

## 2023-08-22 DIAGNOSIS — G459 Transient cerebral ischemic attack, unspecified: Secondary | ICD-10-CM | POA: Diagnosis not present

## 2023-08-22 DIAGNOSIS — I69392 Facial weakness following cerebral infarction: Secondary | ICD-10-CM

## 2023-08-22 NOTE — Patient Instructions (Addendum)
Stop the aspirin and switch to Plavix 75 mg daily alone Strict management of vascular risk factors with a goal BP less than 130/90, A1c less than 7.0, LDL less than 70 for secondary stroke prevention Recommend healthy eating, exercise Follow up here as needed

## 2023-08-22 NOTE — Progress Notes (Signed)
I agree with the above plan

## 2023-08-22 NOTE — Progress Notes (Signed)
Patient: [name] Date of Birth: [birth year]  Reason for Visit: Stroke Clinic Follow Up History from: Patient, granddaughter Lanora Manis Primary Neurologist: Pearlean Brownie  ASSESSMENT AND PLAN 83 y.o. year old female with episode of transient expressive aphasia and right facial numbness.  TIA versus transition from sleep to fully awake.  History of prior stroke/TIA (unclear specifics, at some point right sided numbness, current MRI showed susceptibility artifact in left frontoparietal white matter compatible with prior hemorrhage). History of HTN, HLD.  Previously following with Dr. Anne Hahn for carotid stenosis, at the time CTA neck did not show any stenosis.  Recent CTA head and neck showed moderate left paraclinoid ICA stenosis.  -Can stop aspirin, continue on Plavix 75 mg daily as single agent -Continue close follow-up with primary care for management of vascular risk factors strict management of vascular risk factors with a goal BP less than 130/90, A1c less than 7.0, LDL less than 70 for secondary stroke prevention.  Avoid low blood pressure due to moderate left paraclinoid ICA stenosis. -Discussed the importance of healthy eating, exercise for stroke prevention -Has had a few headaches, if headaches continue will let me know, could be vision related with known eyestrain from retinal tear -Follow-up at our office as needed  HISTORY OF PRESENT ILLNESS: Today 08/22/23 Here with granddaughter, Lanora Manis. Mentions the episode happened around 7 PM was asleep in her chair, felt like she was dreaming, was trying to talk, came out as jibberish. Sounded like she was talking in her sleep, was able to tell her granddaughter "no, not okay". Her eyes were closed. Feels back to normal, little more tired, but energy is coming back. Has had a few headaches, crown of head, takes tylenol, if present usually in the morning, takes Valium long term to help her sleep. Wears contact to 1 eye for vision, has a hole in the  right retina, vision has changed overtime, straining her eyes. BP looks good 130/78, takes Benzapril. On pravastatin for HLD. She remains independent, drives a car. Worst thing is arthritis, balance is not great. Overall doing well. Remains active, independent.   HISTORY  Presented to the ER by EMS 07/27/2023 with transient episode of expressive aphasia and right facial numbness.  Symptoms resolved upon arrival.  History of diabetes, HTN, HLD, prior stroke with residual right facial droop.  On aspirin 81 mg daily, but had ran out for several weeks.  Felt TIA versus transition from sleep to fully awake.  -MRI of the brain no acute infarct -CTA head and neck no LVO, moderate left ICA siphon stenosis -2D echo EF 55 to 60% -LDL 52 -A1c 6.7. -Aspirin 81 mg daily prior to admission, 3 weeks DAPT aspirin 81 and Plavix 75, then Plavix alone  REVIEW OF SYSTEMS: Out of a complete 14 system review of symptoms, the patient complains only of the following symptoms, and all other reviewed systems are negative.  See HPI  ALLERGIES: Allergies  Allergen Reactions   Conjugated Estrogens     REACTION: weight gain  & fibrocystic breast disease changes   Percocet [Oxycodone-Acetaminophen] Itching    Patient can take regular tylenol with no problem.   Detrol [Tolterodine]     Rxed by [facility] Excessive drying    HOME MEDICATIONS: Outpatient Medications Prior to Visit  Medication Sig Dispense Refill   Accu-Chek Softclix Lancets lancets USE AS DIRECTED FOR BLOOD SUGAR ONCE A DAY 100 each 2   acetaminophen (TYLENOL) 500 MG tablet Take 1,000 mg by mouth every morning.  aspirin 81 MG chewable tablet Chew 1 tablet (81 mg total) by mouth at bedtime. 21 tablet 0   benazepril (LOTENSIN) 20 MG tablet TAKE 1 TABLET DAILY 90 tablet 3   Blood Glucose Monitoring Suppl (FREESTYLE LITE) w/Device KIT UAD to check sugars once a day.  E11.9 1 kit 0   Blood Glucose Monitoring Suppl DEVI UAD to check sugars once  daily. E11.9 One touch ultra 2.  May substitute to any manufacturer covered by patient's insurance. 1 each 0   Cholecalciferol (VITAMIN D3) 50 MCG (2000 UT) TABS Take 2,000 Units by mouth at bedtime.     clopidogrel (PLAVIX) 75 MG tablet Take 1 tablet (75 mg total) by mouth daily. 30 tablet 1   Cyanocobalamin (VITAMIN B-12) 5000 MCG TBDP Take 5,000 mcg by mouth in the morning.     diazepam (VALIUM) 5 MG tablet Take 1 tablet (5 mg total) by mouth at bedtime. 30 tablet 5   Dulaglutide (TRULICITY) 1.5 MG/0.5ML SOPN Inject 1.5 mg into the skin once a week. 6 mL 2   escitalopram (LEXAPRO) 10 MG tablet TAKE 1 TABLET DAILY 90 tablet 3   estradiol (ESTRACE) 0.1 MG/GM vaginal cream Place 1 Applicatorful vaginally every three (3) days as needed (vaginal dryness).     gabapentin (NEURONTIN) 300 MG capsule Take 300 mg by mouth 2 (two) times daily.     Glucose Blood (BLOOD GLUCOSE TEST STRIPS) STRP UAD to check sugars  One touch ultra 2. E11.9  May substitute to any manufacturer covered by patient's insurance. 100 strip 3   glucose blood (FREESTYLE LITE) test strip Use as instructed 100 each 12   Lancets Misc. MISC UAD to check sugars. E11.9   May substitute to any manufacturer covered by patient's insurance. 100 each 0   levothyroxine (SYNTHROID) 50 MCG tablet TAKE 1 TABLET(50 MCG) BY MOUTH DAILY 90 tablet 3   metFORMIN (GLUCOPHAGE-XR) 500 MG 24 hr tablet TAKE 2 TABLETS WITH BREAKFAST AND 1 TABLET WITH DINNER 270 tablet 2   Multiple Vitamin (MULTIVITAMIN WITH MINERALS) TABS tablet Take 1 tablet by mouth daily. One A Day for Women     pravastatin (PRAVACHOL) 40 MG tablet TAKE 1 TABLET AT BEDTIME 90 tablet 3   blood glucose meter kit and supplies KIT Dispense based on patient and insurance preference. Use up to four times daily as directed. (FOR E11.9). 1 each 0   No facility-administered medications prior to visit.    PAST MEDICAL HISTORY: Past Medical History:  Diagnosis Date   Anxiety    Arthritis  HANDS, NECK , BACK   Asymptomatic carotid artery stenosis LEFT --  MOD. PER DR WILLIS NOTE OCT 2012   Benign heart murmur    Echo 2011 in Epic   Depression    PMH of   Diabetes mellitus ORAL MED   DVT (deep venous thrombosis) (HCC) 2006   after arthroscopy   Frequency of urination    Headache    History of CVA (cerebrovascular accident) NEUROLOGIST  DR WILLIS----  06-01-2010-  LEFT BASAL GANGLIA HEMORRAGE   RESIDUAL RIGHT HAND NUMBNESS/TINGLING   History of laparoscopic cholecystectomy 05/24/2021   Hyperlipidemia    Hypertension    Hypothyroid    MRSA (methicillin resistant staph aureus) culture positive    X3; last post TKR   Nocturia    Numbness and tingling in right hand RESIDUAL FROM CVA NOV 2011   Obesity    Redux therapy   Osteoporosis    Stroke (HCC)  Thyroid disease    right thyroid nodule-- BX DONE 08-15-2011    PAST SURGICAL HISTORY: Past Surgical History:  Procedure Laterality Date   ABDOMINAL HYSTERECTOMY  1973   Endometriosis & fibroid   CARPAL TUNNEL RELEASE  2000   RIGHT   CHOLECYSTECTOMY N/A 05/23/2021   Procedure: LAPAROSCOPIC CHOLECYSTECTOMY;  Surgeon: Abigail Miyamoto, MD;  Location: [facility];  Service: General;  Laterality: N/A;   COLONOSCOPY  1999 & 2005   Dr Juanda Chance   cystoscope     to evaluate recurrent UTIs   CYSTOSCOPY W/ RETROGRADES  08/22/2011   Procedure: CYSTOSCOPY WITH RETROGRADE PYELOGRAM;  Surgeon: Milford Cage, MD;  [facility];  Service: Urology;  Laterality: Bilateral;   CYSTOSCOPY WITH BIOPSY  08/22/2011   Procedure: CYSTOSCOPY WITH BIOPSY;  Surgeon: Milford Cage, MD;  Location: [facility];  Service: Urology;  Laterality: N/A;   g2 p2     JOINT REPLACEMENT  03-28-2006   LEFT THUMB   KNEE ARTHROSCOPY     BILATERAL PRIOR TO TOTAL KNEE   LEFT SHOULDER SURG  1990'S   LUMBAR LAMINECTOMY  08-26-2009   L 4 - 5   THYROID NODULE BX  08-15-2011   RIGHT   TOTAL KNEE ARTHROPLASTY   05-26-2002      LEFT   AND RIGHT  03-30-2098   WRIST SURGERY  2014   tendon placed in joint; Dr Mina Marble    FAMILY HISTORY: Family History  Problem Relation Age of Onset   Bladder Cancer Mother    Diabetes Mother    Brain cancer Sister    Dementia Sister    Diabetes Sister 37       TYPE 1    Dementia Sister    Pancreatic cancer Sister    Kidney failure Sister        in context of DM & influenza   Dementia Brother    Prostate cancer Brother    Alzheimer's disease Brother    Diabetes Maternal Aunt    Diabetes Maternal Uncle    Diabetes Maternal Grandmother        Type 2   Diabetes Maternal Grandfather        Type 2   Stroke Paternal Grandfather        in 56s   Dementia Daughter    Heart disease Neg Hx     SOCIAL HISTORY: Social History   Socioeconomic History   Marital status: Widowed    Spouse name: Not on file   Number of children: 2   Years of education: Not on file   Highest education level: Not on file  Occupational History   Occupation: Retired  Tobacco Use   Smoking status: Never   Smokeless tobacco: Never  Vaping Use   Vaping status: Never Used  Substance and Sexual Activity   Alcohol use: Yes    Comment:  very rarely , < 1 glass wine / month   Drug use: No   Sexual activity: Not Currently    Birth control/protection: Surgical  Other Topics Concern   Not on file  Social History Narrative   Lives with Daughter and granddaughter   Social Drivers of Health   Financial Resource Strain: Low Risk  (03/01/2023)   Overall Financial Resource Strain (CARDIA)    Difficulty of Paying Living Expenses: Not hard at all  Food Insecurity: No Food Insecurity (03/01/2023)   Hunger Vital Sign    Worried About Programme researcher, broadcasting/film/video in  the Last Year: Never true    Ran Out of Food in the Last Year: Never true  Transportation Needs: No Transportation Needs (03/01/2023)   PRAPARE - Administrator, Civil Service (Medical): No    Lack of Transportation  (Non-Medical): No  Physical Activity: Insufficiently Active (03/01/2023)   Exercise Vital Sign    Days of Exercise per Week: 2 days    Minutes of Exercise per Session: 20 min  Stress: No Stress Concern Present (03/01/2023)   [employer] of Occupational Health - Occupational Stress Questionnaire    Feeling of Stress : Only a little  Social Connections: Moderately Integrated (03/01/2023)   Social Connection and Isolation Panel [NHANES]    Frequency of Communication with Friends and Family: More than three times a week    Frequency of Social Gatherings with Friends and Family: Three times a week    Attends Religious Services: More than 4 times per year    Active Member of Clubs or Organizations: Yes    Attends Banker Meetings: 1 to 4 times per year    Marital Status: Widowed  Intimate Partner Violence: Not At Risk (03/01/2023)   Humiliation, Afraid, Rape, and Kick questionnaire    Fear of Current or Ex-Partner: No    Emotionally Abused: No    Physically Abused: No    Sexually Abused: No    PHYSICAL EXAM  Vitals:   08/22/23 0806 08/22/23 0819  BP: (!) 147/76 130/78  Pulse: 72 72  Weight: 163 lb (73.9 kg)   Height: 5\' 6"  (1.676 m)    Body mass index is 26.31 kg/m.  Generalized: Well developed, in no acute distress  Neurological examination  Mentation: Alert oriented to time, place, history taking. Follows all commands speech and language fluent Cranial nerve II-XII: Pupils were equal round reactive to light. Extraocular movements were full, visual field were full on confrontational test.  Mild facial asymmetry to the right side of mouth.  Head turning and shoulder shrug  were normal and symmetric. Motor: The motor testing reveals 5 over 5 strength of all 4 extremities. Good symmetric motor tone is noted throughout.  Sensory: Sensory testing is intact to soft touch on all 4 extremities. No evidence of extinction is noted.  Coordination: Cerebellar testing reveals good  finger-nose-finger and heel-to-shin bilaterally.  Gait and station: Gait is normal and independent. Reflexes: Deep tendon reflexes are symmetric and normal bilaterally.   DIAGNOSTIC DATA (LABS, IMAGING, TESTING) - I reviewed patient records, labs, notes, testing and imaging myself where available.  Lab Results  Component Value Date   WBC 6.6 07/28/2023   HGB 12.2 07/28/2023   HCT 34.7 (L) 07/28/2023   MCV 90.4 07/28/2023   PLT 208 07/28/2023      Component Value Date/Time   NA 138 07/28/2023 0459   K 3.8 07/28/2023 0459   CL 105 07/28/2023 0459   CO2 22 07/28/2023 0459   GLUCOSE 136 (H) 07/28/2023 0459   BUN 13 07/28/2023 0459   CREATININE 0.66 07/28/2023 0459   CREATININE 0.65 03/09/2020 1443   CALCIUM 9.1 07/28/2023 0459   PROT 5.7 (L) 07/28/2023 0459   ALBUMIN 3.5 07/28/2023 0459   AST 19 07/28/2023 0459   ALT 16 07/28/2023 0459   ALKPHOS 30 (L) 07/28/2023 0459   BILITOT 0.6 07/28/2023 0459   GFRNONAA >60 07/28/2023 0459   GFRNONAA 83 03/09/2020 1443   GFRAA 97 03/09/2020 1443   Lab Results  Component Value Date   CHOL  132 07/28/2023   HDL 57 07/28/2023   LDLCALC 52 07/28/2023   LDLDIRECT 61.0 06/30/2018   TRIG 116 07/28/2023   CHOLHDL 2.3 07/28/2023   Lab Results  Component Value Date   HGBA1C 6.7 (H) 07/28/2023   Lab Results  Component Value Date   VITAMINB12 964 (H) 09/25/2022   Lab Results  Component Value Date   TSH 3.73 04/02/2023    Margie Ege, AGNP-C, DNP 08/22/2023, 8:24 AM [facility] [address] [place], [place] [address] [phone]

## 2023-09-05 DIAGNOSIS — N3020 Other chronic cystitis without hematuria: Secondary | ICD-10-CM | POA: Diagnosis not present

## 2023-09-05 DIAGNOSIS — R310 Gross hematuria: Secondary | ICD-10-CM | POA: Diagnosis not present

## 2023-09-05 DIAGNOSIS — R8271 Bacteriuria: Secondary | ICD-10-CM | POA: Diagnosis not present

## 2023-09-09 ENCOUNTER — Telehealth: Payer: Self-pay | Admitting: Neurology

## 2023-09-09 MED ORDER — CLOPIDOGREL BISULFATE 75 MG PO TABS: 75.0000 mg | ORAL_TABLET | Freq: Every day | ORAL | 2 refills | Status: DC

## 2023-09-09 NOTE — Telephone Encounter (Signed)
Pt is asking that Maralyn Sago, NP fills her  clopidogrel (PLAVIX) 75 MG tablet  to [facility] [phone]

## 2023-09-18 DIAGNOSIS — M1990 Unspecified osteoarthritis, unspecified site: Secondary | ICD-10-CM | POA: Diagnosis not present

## 2023-09-18 DIAGNOSIS — E1142 Type 2 diabetes mellitus with diabetic polyneuropathy: Secondary | ICD-10-CM | POA: Diagnosis not present

## 2023-09-18 DIAGNOSIS — Z7984 Long term (current) use of oral hypoglycemic drugs: Secondary | ICD-10-CM | POA: Diagnosis not present

## 2023-09-18 DIAGNOSIS — Z8249 Family history of ischemic heart disease and other diseases of the circulatory system: Secondary | ICD-10-CM | POA: Diagnosis not present

## 2023-09-18 DIAGNOSIS — Z9181 History of falling: Secondary | ICD-10-CM | POA: Diagnosis not present

## 2023-09-18 DIAGNOSIS — Z833 Family history of diabetes mellitus: Secondary | ICD-10-CM | POA: Diagnosis not present

## 2023-09-18 DIAGNOSIS — I10 Essential (primary) hypertension: Secondary | ICD-10-CM | POA: Diagnosis not present

## 2023-09-18 DIAGNOSIS — F411 Generalized anxiety disorder: Secondary | ICD-10-CM | POA: Diagnosis not present

## 2023-09-18 DIAGNOSIS — E785 Hyperlipidemia, unspecified: Secondary | ICD-10-CM | POA: Diagnosis not present

## 2023-09-18 DIAGNOSIS — M810 Age-related osteoporosis without current pathological fracture: Secondary | ICD-10-CM | POA: Diagnosis not present

## 2023-09-18 DIAGNOSIS — E039 Hypothyroidism, unspecified: Secondary | ICD-10-CM | POA: Diagnosis not present

## 2023-09-18 DIAGNOSIS — Z8673 Personal history of transient ischemic attack (TIA), and cerebral infarction without residual deficits: Secondary | ICD-10-CM | POA: Diagnosis not present

## 2023-09-30 DIAGNOSIS — R310 Gross hematuria: Secondary | ICD-10-CM | POA: Diagnosis not present

## 2023-10-01 ENCOUNTER — Encounter: Payer: Self-pay | Admitting: Internal Medicine

## 2023-10-01 NOTE — Patient Instructions (Addendum)
      Blood work was ordered.       Medications changes include :   None    A referral was ordered and someone will call you to schedule an appointment.     Return in about 6 months (around 04/03/2024) for Physical Exam.

## 2023-10-01 NOTE — Progress Notes (Unsigned)
 Subjective:    Patient ID: [name], female    DOB: 1940-09-27, 83 y.o.   MRN: [id number]     HPI [name] is here for follow up of her chronic medical problems.  Went to ED 07/27/2023-diagnosed with TIA, dysarthria.  She had an episode while she was talking to family that her words did not make any sense-this lasted approximately 5 minute.  MRI without evidence of stroke.  Aspirin 81 mg daily stopped and started on Plavix 75 mg daily.  Feels her recall is a little worse - may be getting better.   Had blood work done in January.  Saw urology - had a little blood in the urine -- had Ct scan.-Has follow-up to discuss the CT scan.  Medications and allergies reviewed with patient and updated if appropriate.  Current Outpatient Medications on File Prior to Visit  Medication Sig Dispense Refill   acetaminophen (TYLENOL) 500 MG tablet Take 1,000 mg by mouth every morning.     benazepril (LOTENSIN) 20 MG tablet TAKE 1 TABLET DAILY 90 tablet 3   Cholecalciferol (VITAMIN D3) 50 MCG (2000 UT) TABS Take 2,000 Units by mouth at bedtime.     clopidogrel (PLAVIX) 75 MG tablet Take 1 tablet (75 mg total) by mouth daily. 30 tablet 2   Cyanocobalamin (VITAMIN B-12) 5000 MCG TBDP Take 5,000 mcg by mouth in the morning.     diazepam (VALIUM) 5 MG tablet Take 1 tablet (5 mg total) by mouth at bedtime. 30 tablet 5   escitalopram (LEXAPRO) 10 MG tablet TAKE 1 TABLET DAILY 90 tablet 3   estradiol (ESTRACE) 0.1 MG/GM vaginal cream Place 1 Applicatorful vaginally every three (3) days as needed (vaginal dryness).     gabapentin (NEURONTIN) 300 MG capsule Take 300 mg by mouth 2 (two) times daily.     Lancets Misc. MISC UAD to check sugars. E11.9   May substitute to any manufacturer covered by patient's insurance. 100 each 0   levothyroxine (SYNTHROID) 50 MCG tablet TAKE 1 TABLET(50 MCG) BY MOUTH DAILY 90 tablet 3   metFORMIN (GLUCOPHAGE-XR) 500 MG 24 hr tablet TAKE 2 TABLETS WITH BREAKFAST AND 1 TABLET  WITH DINNER 270 tablet 2   Multiple Vitamin (MULTIVITAMIN WITH MINERALS) TABS tablet Take 1 tablet by mouth daily. One A Day for Women     pravastatin (PRAVACHOL) 40 MG tablet TAKE 1 TABLET AT BEDTIME 90 tablet 3   No current facility-administered medications on file prior to visit.     Review of Systems  Constitutional:  Negative for fever.  Respiratory:  Negative for cough, shortness of breath and wheezing.   Cardiovascular:  Positive for palpitations (one episode a couple of weeks ago). Negative for chest pain and leg swelling.  Gastrointestinal:  Negative for abdominal pain.       Gerd with certain foods  Neurological:  Positive for headaches (occ). Negative for light-headedness.       Objective:   Vitals:   10/02/23 1122  BP: 130/70  Pulse: 75  Temp: 98 F (36.7 C)  SpO2: 96%   BP Readings from Last 3 Encounters:  10/02/23 130/70  08/22/23 130/78  07/28/23 (!) 115/98   Wt Readings from Last 3 Encounters:  10/02/23 164 lb (74.4 kg)  08/22/23 163 lb (73.9 kg)  04/02/23 156 lb (70.8 kg)   Body mass index is 26.47 kg/m.    Physical Exam Constitutional:      General: She is not in acute  distress.    Appearance: Normal appearance.  HENT:     Head: Normocephalic and atraumatic.  Eyes:     Conjunctiva/sclera: Conjunctivae normal.  Cardiovascular:     Rate and Rhythm: Normal rate and regular rhythm.     Heart sounds: Normal heart sounds.  Pulmonary:     Effort: Pulmonary effort is normal. No respiratory distress.     Breath sounds: Normal breath sounds. No wheezing.  Musculoskeletal:     Cervical back: Neck supple.     Right lower leg: No edema.     Left lower leg: No edema.  Lymphadenopathy:     Cervical: No cervical adenopathy.  Skin:    General: Skin is warm and dry.     Findings: No rash.  Neurological:     Mental Status: She is alert. Mental status is at baseline.  Psychiatric:        Mood and Affect: Mood normal.        Behavior: Behavior normal.         Lab Results  Component Value Date   WBC 6.6 07/28/2023   HGB 12.2 07/28/2023   HCT 34.7 (L) 07/28/2023   PLT 208 07/28/2023   GLUCOSE 136 (H) 07/28/2023   CHOL 132 07/28/2023   TRIG 116 07/28/2023   HDL 57 07/28/2023   LDLDIRECT 61.0 06/30/2018   LDLCALC 52 07/28/2023   ALT 16 07/28/2023   AST 19 07/28/2023   NA 138 07/28/2023   K 3.8 07/28/2023   CL 105 07/28/2023   CREATININE 0.66 07/28/2023   BUN 13 07/28/2023   CO2 22 07/28/2023   TSH 3.73 04/02/2023   INR 1.8 (H) 04/02/2008   HGBA1C 6.7 (H) 07/28/2023   MICROALBUR 13.6 (H) 04/02/2023     Assessment & Plan:    See Problem List for Assessment and Plan of chronic medical problems.

## 2023-10-02 ENCOUNTER — Ambulatory Visit (INDEPENDENT_AMBULATORY_CARE_PROVIDER_SITE_OTHER): Payer: Medicare HMO | Admitting: Internal Medicine

## 2023-10-02 VITALS — BP 130/70 | HR 75 | Temp 98.0°F | Ht 66.0 in | Wt 164.0 lb

## 2023-10-02 DIAGNOSIS — E7800 Pure hypercholesterolemia, unspecified: Secondary | ICD-10-CM | POA: Diagnosis not present

## 2023-10-02 DIAGNOSIS — E559 Vitamin D deficiency, unspecified: Secondary | ICD-10-CM

## 2023-10-02 DIAGNOSIS — E119 Type 2 diabetes mellitus without complications: Secondary | ICD-10-CM

## 2023-10-02 DIAGNOSIS — Z7984 Long term (current) use of oral hypoglycemic drugs: Secondary | ICD-10-CM | POA: Diagnosis not present

## 2023-10-02 DIAGNOSIS — E039 Hypothyroidism, unspecified: Secondary | ICD-10-CM

## 2023-10-02 DIAGNOSIS — F411 Generalized anxiety disorder: Secondary | ICD-10-CM

## 2023-10-02 DIAGNOSIS — Z7985 Long-term (current) use of injectable non-insulin antidiabetic drugs: Secondary | ICD-10-CM

## 2023-10-02 DIAGNOSIS — I6930 Unspecified sequelae of cerebral infarction: Secondary | ICD-10-CM

## 2023-10-02 DIAGNOSIS — M542 Cervicalgia: Secondary | ICD-10-CM | POA: Diagnosis not present

## 2023-10-02 DIAGNOSIS — M5431 Sciatica, right side: Secondary | ICD-10-CM | POA: Diagnosis not present

## 2023-10-02 DIAGNOSIS — I10 Essential (primary) hypertension: Secondary | ICD-10-CM

## 2023-10-02 DIAGNOSIS — E538 Deficiency of other specified B group vitamins: Secondary | ICD-10-CM

## 2023-10-02 MED ORDER — TRULICITY 3 MG/0.5ML ~~LOC~~ SOAJ: 3.0000 mg | SUBCUTANEOUS | Status: DC

## 2023-10-02 NOTE — Assessment & Plan Note (Signed)
Chronic BP well controlled Continue benazepril 20 mg daily

## 2023-10-02 NOTE — Assessment & Plan Note (Signed)
Chronic Controlled, stable Does have some mild depression and anxiety at times, but overall controlled Continue lexparo 10 mg daily, diazepam 5 mg HS

## 2023-10-02 NOTE — Assessment & Plan Note (Signed)
Chronic Continue B12 supplementation 

## 2023-10-02 NOTE — Assessment & Plan Note (Signed)
 Chronic Following with orthopedics Taking gabapentin 300 mg bid

## 2023-10-02 NOTE — Assessment & Plan Note (Signed)
 History of CVA, TIA 2 months ago Has seen neurology service for TIA Continue Plavix 75 mg daily, pravastatin 40 mg daily Blood pressure well-controlled Sugars controlled LDL at goal

## 2023-10-02 NOTE — Assessment & Plan Note (Signed)
 Chronic Continue vitamin D supplementation

## 2023-10-02 NOTE — Assessment & Plan Note (Signed)
 Chronic  Clinically euthyroid Lab Results  Component Value Date   TSH 3.73 04/02/2023   TSH in normal range Continue levothyroxine 50 mcg daily

## 2023-10-02 NOTE — Assessment & Plan Note (Addendum)
 Chronic  Lab Results  Component Value Date   HGBA1C 6.7 (H) 07/28/2023   Sugars controlled Taking metformin xr 1000 mg am, 500 mg pm, Trulicity 1.5 mg weekly Sugars have gone up and she has gained a little weight - would like to retry trulicity 3 mg weekly -- so will increase Stressed regular exercise, diabetic diet, advised making sure she is getting enough protein since she is on Trulicity

## 2023-10-02 NOTE — Assessment & Plan Note (Signed)
 Chronic Did PT Has seen neurosurgery Taking gabapentin 300 mg twice a day

## 2023-10-02 NOTE — Assessment & Plan Note (Signed)
 Chronic Lab Results  Component Value Date   LDLCALC 52 07/28/2023   Continue pravastatin 40 mg daily Regular exercise and healthy diet encouraged

## 2023-10-08 ENCOUNTER — Telehealth: Payer: Self-pay | Admitting: Neurology

## 2023-10-08 NOTE — Telephone Encounter (Signed)
 I got a stat surgical clearance for L5-S1 ESI requesting to hold Plavix 7 days prior to procedure.  Can you check with patient to see why it is ordered as stat?  She was admitted 07/27/2023 for TIA versus transition from sleep to fully awake.  On Plavix for secondary stroke prevention.  Generally we recommend 3 to 6 months before surgical procedure to hold antiplatelet.

## 2023-10-14 ENCOUNTER — Other Ambulatory Visit: Payer: Self-pay | Admitting: Internal Medicine

## 2023-10-14 DIAGNOSIS — N3020 Other chronic cystitis without hematuria: Secondary | ICD-10-CM | POA: Diagnosis not present

## 2023-10-14 DIAGNOSIS — R310 Gross hematuria: Secondary | ICD-10-CM | POA: Diagnosis not present

## 2023-10-14 NOTE — Telephone Encounter (Signed)
 Copied from CRM [id number]. Topic: Clinical - Medication Refill >> Oct 14, 2023  2:16 PM Almira Coaster wrote: Most Recent Primary Care Visit:  Provider: BURNS, Bobette Mo  Department: [facility]  Visit Type: OFFICE VISIT  Date: 10/02/2023  Medication: Dulaglutide (TRULICITY) 3 MG/0.5ML SOAJ & metFORMIN (GLUCOPHAGE-XR) 500 MG 24 hr tablet   Has the patient contacted their pharmacy? NO, Dr.Burns asked patient to call in on her next refill.  (Agent: If no, request that the patient contact thNoe pharmacy for the refill. If patient does not wish to contact the pharmacy document the reason why and proceed with request.) (Agent: If yes, when and what did the pharmacy advise?)  Is this the correct pharmacy for this prescription? Yes If no, delete pharmacy and type the correct one.  This is the patient's preferred pharmacy:  [facility] - [place], [place] - [address] [place] [address] Phone: [phone] Fax: [phone]     Has the prescription been filled recently? No  Is the patient out of the medication? No  Has the patient been seen for an appointment in the last year OR does the patient have an upcoming appointment? Yes  Can we respond through MyChart? Yes  Agent: Please be advised that Rx refills may take up to 3 business days. We ask that you follow-up with your pharmacy.

## 2023-10-15 MED ORDER — TRULICITY 3 MG/0.5ML ~~LOC~~ SOAJ: 3.0000 mg | SUBCUTANEOUS | 5 refills | Status: DC

## 2023-10-15 MED ORDER — METFORMIN HCL ER 500 MG PO TB24: ORAL_TABLET | ORAL | 2 refills | Status: DC

## 2023-10-24 ENCOUNTER — Encounter: Payer: Self-pay | Admitting: Internal Medicine

## 2023-10-24 ENCOUNTER — Ambulatory Visit (INDEPENDENT_AMBULATORY_CARE_PROVIDER_SITE_OTHER): Admitting: Internal Medicine

## 2023-10-24 VITALS — BP 138/72 | HR 70 | Temp 98.2°F | Ht 66.0 in | Wt 166.0 lb

## 2023-10-24 DIAGNOSIS — I10 Essential (primary) hypertension: Secondary | ICD-10-CM | POA: Diagnosis not present

## 2023-10-24 DIAGNOSIS — K591 Functional diarrhea: Secondary | ICD-10-CM | POA: Insufficient documentation

## 2023-10-24 MED ORDER — DIAZEPAM 5 MG PO TABS: 5.0000 mg | ORAL_TABLET | Freq: Every day | ORAL | 5 refills | Status: DC

## 2023-10-24 NOTE — Assessment & Plan Note (Signed)
 New having diarrhea approximately 1 hour after eating in the morning Has also had some fecal incontinence CT scan done by urology showed stool ball in the rectosigmoid colon She tried Colace 2 tabs daily and MiraLAX and they did not help-made her having more diarrhea and fecal incontinence so she stopped medications Referral to GI ordered Could try an enema to see if that helps Can also try taking Benefiber to see if bulking up her stool helps move out the stool that is there

## 2023-10-24 NOTE — Assessment & Plan Note (Signed)
Chronic BP well controlled Continue benazepril 20 mg daily

## 2023-10-24 NOTE — Patient Instructions (Addendum)
    Gi at [facility]: [phone]       Medications changes include :   None    A referral was ordered for [facility] and someone will call you to schedule an appointment.     Return for follow up as scheduled.

## 2023-10-24 NOTE — Progress Notes (Signed)
 Subjective:    Patient ID: [name], female    DOB: 1941/03/31, 83 y.o.   MRN: [id number]      HPI [name] is here for  Chief Complaint  Patient presents with   Referral    Abdominal pain with loss of bowels at times    [facility] urology - saw some blood in the urine.  Had a Ct scan.  Negative for urological issues but had stool in rectosigmoid.    Has diarrhea after eating in the morning.  She has some LLQ pain when she starts to have the diarrhea.  The Ct scan showed stool ball retention in the rectosigmoid colon.  Dr Marlou Porch advised taking colace and miralax and that made it worse.  She stopped the medications after one week.  She has fecal incontinence now.    She is afraid to go out because of the diarrhea.      Medications and allergies reviewed with patient and updated if appropriate.  Current Outpatient Medications on File Prior to Visit  Medication Sig Dispense Refill   acetaminophen (TYLENOL) 500 MG tablet Take 1,000 mg by mouth every morning.     benazepril (LOTENSIN) 20 MG tablet TAKE 1 TABLET DAILY 90 tablet 3   Cholecalciferol (VITAMIN D3) 50 MCG (2000 UT) TABS Take 2,000 Units by mouth at bedtime.     clopidogrel (PLAVIX) 75 MG tablet Take 1 tablet (75 mg total) by mouth daily. 30 tablet 2   Cyanocobalamin (VITAMIN B-12) 5000 MCG TBDP Take 5,000 mcg by mouth in the morning.     diazepam (VALIUM) 5 MG tablet Take 1 tablet (5 mg total) by mouth at bedtime. 30 tablet 5   Dulaglutide (TRULICITY) 3 MG/0.5ML SOAJ Inject 3 mg as directed once a week. 2 mL 5   escitalopram (LEXAPRO) 10 MG tablet TAKE 1 TABLET DAILY 90 tablet 3   estradiol (ESTRACE) 0.1 MG/GM vaginal cream Place 1 Applicatorful vaginally every three (3) days as needed (vaginal dryness).     gabapentin (NEURONTIN) 300 MG capsule Take 300 mg by mouth 2 (two) times daily.     Lancets Misc. MISC UAD to check sugars. E11.9   May substitute to any manufacturer covered by patient's insurance. 100 each 0    levothyroxine (SYNTHROID) 50 MCG tablet TAKE 1 TABLET(50 MCG) BY MOUTH DAILY 90 tablet 3   metFORMIN (GLUCOPHAGE-XR) 500 MG 24 hr tablet TAKE 2 TABLETS WITH BREAKFAST AND 1 TABLET WITH DINNER 270 tablet 2   Multiple Vitamin (MULTIVITAMIN WITH MINERALS) TABS tablet Take 1 tablet by mouth daily. One A Day for Women     pravastatin (PRAVACHOL) 40 MG tablet TAKE 1 TABLET AT BEDTIME 90 tablet 3   No current facility-administered medications on file prior to visit.    Review of Systems  Constitutional:  Negative for fever.  Gastrointestinal:  Positive for abdominal pain (LLQ before a BM) and diarrhea. Negative for blood in stool (no melena), constipation, nausea and vomiting.       GERD       Objective:   Vitals:   10/24/23 0947  BP: 138/72  Pulse: 70  Temp: 98.2 F (36.8 C)  SpO2: 94%   BP Readings from Last 3 Encounters:  10/24/23 138/72  10/02/23 130/70  08/22/23 130/78   Wt Readings from Last 3 Encounters:  10/24/23 166 lb (75.3 kg)  10/02/23 164 lb (74.4 kg)  08/22/23 163 lb (73.9 kg)   Body mass index is 26.79 kg/m.  Physical Exam Constitutional:      General: She is not in acute distress.    Appearance: Normal appearance. She is not ill-appearing.  HENT:     Head: Normocephalic and atraumatic.  Abdominal:     General: There is no distension.     Palpations: Abdomen is soft. There is no mass.     Tenderness: There is abdominal tenderness (mild tenderness in LLQ). There is no guarding or rebound.  Skin:    General: Skin is warm and dry.  Neurological:     Mental Status: She is alert.            Assessment & Plan:    See Problem List for Assessment and Plan of chronic medical problems.

## 2023-10-25 ENCOUNTER — Encounter: Payer: Self-pay | Admitting: Physician Assistant

## 2023-11-10 ENCOUNTER — Encounter: Payer: Self-pay | Admitting: Internal Medicine

## 2023-11-10 DIAGNOSIS — E119 Type 2 diabetes mellitus without complications: Secondary | ICD-10-CM

## 2023-11-11 DIAGNOSIS — M5416 Radiculopathy, lumbar region: Secondary | ICD-10-CM | POA: Diagnosis not present

## 2023-11-13 ENCOUNTER — Encounter: Payer: Self-pay | Admitting: Internal Medicine

## 2023-11-13 ENCOUNTER — Other Ambulatory Visit (INDEPENDENT_AMBULATORY_CARE_PROVIDER_SITE_OTHER)

## 2023-11-13 DIAGNOSIS — E119 Type 2 diabetes mellitus without complications: Secondary | ICD-10-CM

## 2023-11-13 LAB — MICROALBUMIN / CREATININE URINE RATIO
Creatinine,U: 135.7 mg/dL
Microalb Creat Ratio: 29.1 mg/g (ref 0.0–30.0)
Microalb, Ur: 3.9 mg/dL — ABNORMAL HIGH (ref 0.0–1.9)

## 2023-11-28 NOTE — Progress Notes (Signed)
 Brigitte Canard, PA-C [address] [place], [place]  [address] Phone: [phone]   Gastroenterology Consultation  Referring Provider:     Colene Dauphin, MD Primary Care Physician:  Colene Dauphin, MD Primary Gastroenterologist:  Brigitte Canard, PA-C / Alvester Johnson, MD  Reason for Consultation:     Constipation, diarrhea, LLQ Pain, GERD        HPI:   [name] is a 83 y.o. y/o female referred for consultation & management  by Colene Dauphin, MD. Patient has history of chronic diarrhea.  Underwent cholecystectomy 05/2021.  Current sx: Constipation, Diarrhea, LLQ Pain, GERD.  Patient states she has left lower quadrant and left side abdominal pain which comes and goes.  It is worse if she is constipated.  She has episodes of constipation alternating with diarrhea.  Occasionally takes Imodium as needed.  Has also taken Pepto-Bismol.  No laxatives.  She has only had 1 bowel movement this week.  Denies rectal bleeding.  Patient also reports increased acid reflux at nighttime since having cholecystectomy in 2022.  No current treatment for GERD.  Takes Tums as needed with little benefit.  Had recent Abd CT through Urologist.  She has Chronic UTIs, on daily antibiotic prophylaxis.  Abdominal pelvic CT showed large ball of stool in the rectosigmoid colon.  Patient followed up with her PCP Dr. Donnette Gal for irregular bowel habits.  Abd Xray showed constipation.  She was started on Benefiber powder with some benefit.  Was also given a fleets enema.  Patient takes Benefiber 3 or 4 days/week, not consistent.  She last saw Dr. General Kenner to evaluate diarrhea in 2018.  Before that she was followed by Dr. Joshua Nieves.   History of enterovesicle fistula s/p surgical repair (no known Crohn's).   PMH: DVT, CVA, diabetes.  Cholecystectomy 05/2021.  07/2023 labs: Normal CBC and CMP except glucose 136.  A1c 6.7.  Hemoglobin 12.2. 03/2023 normal TSH. No recent stool studies.  10/2016 colonoscopy by  Dr. General Kenner: 5 mm benign colon mucosa polyp removed from sigmoid colon.  Good prep.  Prominent AVM in the sigmoid colon with no bleeding.  No evidence of IBD.  Biopsies negative for microscopic colitis.  Colonoscopy 12/30/2012 - normal, no biopsies obtained, aborted in sigmoid colon.   Past Medical History:  Diagnosis Date   Anxiety    Arthritis HANDS, NECK , BACK   Asymptomatic carotid artery stenosis LEFT --  MOD. PER DR WILLIS NOTE OCT 2012   Benign heart murmur    Echo 2011 in Epic   Depression    PMH of   Diabetes mellitus ORAL MED   DVT (deep venous thrombosis) (HCC) 2006   after arthroscopy   Frequency of urination    Headache    History of CVA (cerebrovascular accident) NEUROLOGIST  DR WILLIS----  06-01-2010-  LEFT BASAL GANGLIA HEMORRAGE   RESIDUAL RIGHT HAND NUMBNESS/TINGLING   History of laparoscopic cholecystectomy 05/24/2021   Hyperlipidemia    Hypertension    Hypothyroid    MRSA (methicillin resistant staph aureus) culture positive    X3; last post TKR   Nocturia    Numbness and tingling in right hand RESIDUAL FROM CVA NOV 2011   Obesity    Redux therapy   Osteoporosis    Stroke [facility])    Thyroid  disease    right thyroid  nodule-- BX DONE 08-15-2011    Past Surgical History:  Procedure Laterality Date   ABDOMINAL HYSTERECTOMY  1973  Endometriosis & fibroid   CARPAL TUNNEL RELEASE  2000   RIGHT   CHOLECYSTECTOMY N/A 05/23/2021   Procedure: LAPAROSCOPIC CHOLECYSTECTOMY;  Surgeon: Oza Blumenthal, MD;  Location: [facility];  Service: General;  Laterality: N/A;   COLONOSCOPY  1999 & 2005   Dr Grandville Lax   cystoscope     to evaluate recurrent UTIs   CYSTOSCOPY W/ RETROGRADES  08/22/2011   Procedure: CYSTOSCOPY WITH RETROGRADE PYELOGRAM;  Surgeon: Soledad Dupes, MD;  Location: [facility];  Service: Urology;  Laterality: Bilateral;   CYSTOSCOPY WITH BIOPSY  08/22/2011   Procedure: CYSTOSCOPY WITH BIOPSY;  Surgeon: Soledad Dupes, MD;   Location: [facility];  Service: Urology;  Laterality: N/A;   g2 p2     JOINT REPLACEMENT  03-28-2006   LEFT THUMB   KNEE ARTHROSCOPY     BILATERAL PRIOR TO TOTAL KNEE   LEFT SHOULDER SURG  1990'S   LUMBAR LAMINECTOMY  08-26-2009   L 4 - 5   THYROID  NODULE BX  08-15-2011   RIGHT   TOTAL KNEE ARTHROPLASTY  05-26-2002      LEFT   AND RIGHT  03-30-2098   WRIST SURGERY  2014   tendon placed in joint; Dr Donzella Galley    Prior to Admission medications   Medication Sig Start Date End Date Taking? Authorizing Provider  acetaminophen  (TYLENOL ) 500 MG tablet Take 1,000 mg by mouth every morning.    [provider]  benazepril  (LOTENSIN ) 20 MG tablet TAKE 1 TABLET DAILY 05/13/23   Colene Dauphin, MD  Cholecalciferol (VITAMIN D3) 50 MCG (2000 UT) TABS Take 2,000 Units by mouth at bedtime.    [provider]  clopidogrel  (PLAVIX ) 75 MG tablet Take 1 tablet (75 mg total) by mouth daily. 09/09/23   Wess Hammed, NP  Cyanocobalamin  (VITAMIN B-12) 5000 MCG TBDP Take 5,000 mcg by mouth in the morning.    [provider]  diazepam  (VALIUM ) 5 MG tablet Take 1 tablet (5 mg total) by mouth at bedtime. 10/24/23   Colene Dauphin, MD  Dulaglutide  (TRULICITY ) 3 MG/0.5ML SOAJ Inject 3 mg as directed once a week. 10/15/23   Colene Dauphin, MD  escitalopram  (LEXAPRO ) 10 MG tablet TAKE 1 TABLET DAILY 04/15/23   Colene Dauphin, MD  estradiol (ESTRACE) 0.1 MG/GM vaginal cream Place 1 Applicatorful vaginally every three (3) days as needed (vaginal dryness). 08/26/20   [provider]  gabapentin (NEURONTIN) 300 MG capsule Take 300 mg by mouth 2 (two) times daily. 02/12/22   [provider]  Lancets Misc. MISC UAD to check sugars. E11.9   May substitute to any manufacturer covered by patient's insurance. 04/02/23   Colene Dauphin, MD  levothyroxine  (SYNTHROID ) 50 MCG tablet TAKE 1 TABLET(50 MCG) BY MOUTH DAILY 07/22/23   Colene Dauphin, MD  metFORMIN  (GLUCOPHAGE -XR) 500  MG 24 hr tablet TAKE 2 TABLETS WITH BREAKFAST AND 1 TABLET WITH DINNER 10/15/23   Burns, Stacy J, MD  Multiple Vitamin (MULTIVITAMIN WITH MINERALS) TABS tablet Take 1 tablet by mouth daily. One A Day for Women    [provider]  pravastatin  (PRAVACHOL ) 40 MG tablet TAKE 1 TABLET AT BEDTIME 11/26/22   Colene Dauphin, MD    Family History  Problem Relation Age of Onset   Bladder Cancer Mother    Diabetes Mother    Brain cancer Sister    Dementia Sister    Diabetes Sister 14  TYPE 1    Dementia Sister    Pancreatic cancer Sister    Kidney failure Sister        in context of DM & influenza   Dementia Brother    Prostate cancer Brother    Alzheimer's disease Brother    Diabetes Maternal Aunt    Diabetes Maternal Uncle    Diabetes Maternal Grandmother        Type 2   Diabetes Maternal Grandfather        Type 2   Stroke Paternal Grandfather        in 61s   Dementia Daughter    Heart disease Neg Hx    Colon cancer Neg Hx    Esophageal cancer Neg Hx    Stomach cancer Neg Hx      Social History   Tobacco Use   Smoking status: Never   Smokeless tobacco: Never  Vaping Use   Vaping status: Never Used  Substance Use Topics   Alcohol use: Yes    Comment:  very rarely , < 1 glass wine / month   Drug use: No    Allergies as of 11/29/2023 - Review Complete 11/29/2023  Allergen Reaction Noted   Conjugated estrogens  06/16/2007   Percocet [oxycodone -acetaminophen ] Itching 06/08/2013   Detrol [tolterodine]  07/29/2013    Review of Systems:    All systems reviewed and negative except where noted in HPI.   Physical Exam:  BP 138/80   Pulse 80   Ht 5\' 6"  (1.676 m)   Wt 161 lb (73 kg)   SpO2 96%   BMI 25.99 kg/m  No LMP recorded. Patient has had a hysterectomy.  General:   Alert,  Well-developed, well-nourished, pleasant and cooperative in NAD Lungs:  Respirations even and unlabored.  Clear throughout to auscultation.   No wheezes, crackles, or rhonchi. No  acute distress. Heart:  Regular rate and rhythm; no murmurs, clicks, rubs, or gallops. Abdomen:  Normal bowel sounds.  No bruits.  Soft, and non-distended without masses, hepatosplenomegaly or hernias noted.  Mild left mid and left lower quadrant abdominal tenderness.  No right-sided or upper abdominal tenderness.  No guarding or rebound tenderness.    Neurologic:  Alert and oriented x3;  grossly normal neurologically. Psych:  Alert and cooperative. Normal mood and affect.  Imaging Studies: No results found.  Labs: CBC    Component Value Date/Time   WBC 6.6 07/28/2023 0459   RBC 3.84 (L) 07/28/2023 0459   HGB 12.2 07/28/2023 0459   HCT 34.7 (L) 07/28/2023 0459   PLT 208 07/28/2023 0459   MCV 90.4 07/28/2023 0459   MCV 93.5 07/22/2013 1447   MCH 31.8 07/28/2023 0459   MCHC 35.2 07/28/2023 0459   RDW 12.4 07/28/2023 0459   LYMPHSABS 2.0 07/27/2023 1920   MONOABS 0.6 07/27/2023 1920   EOSABS 0.2 07/27/2023 1920   BASOSABS 0.0 07/27/2023 1920    CMP     Component Value Date/Time   NA 138 07/28/2023 0459   K 3.8 07/28/2023 0459   CL 105 07/28/2023 0459   CO2 22 07/28/2023 0459   GLUCOSE 136 (H) 07/28/2023 0459   BUN 13 07/28/2023 0459   CREATININE 0.66 07/28/2023 0459   CREATININE 0.65 03/09/2020 1443   CALCIUM 9.1 07/28/2023 0459   PROT 5.7 (L) 07/28/2023 0459   ALBUMIN 3.5 07/28/2023 0459   AST 19 07/28/2023 0459   ALT 16 07/28/2023 0459   ALKPHOS 30 (L) 07/28/2023 0459   BILITOT  0.6 07/28/2023 0459   GFRNONAA >60 07/28/2023 0459   GFRNONAA 83 03/09/2020 1443   GFRAA 97 03/09/2020 1443    Assessment and Plan:   BETHANEE MUSHTAQ is a 83 y.o. y/o female has been referred for diarrhea, constipation, and irregular bowel habits.  History of chronic diarrhea dating back to 2018.  Had cholecystectomy 05/2021.  Last colonoscopy from 2018 showed biopsies negative for microscopic colitis.  No evidence of IBD.  Recent abdominal CT pelvic CT through urology and abdominal x-ray  through PCP showed moderate constipation.  I am most suspicious for Overflow Diarrhea from Constipation based on history today.  1.  Diarrhea -suspect overflow diarrhea from constipation. - Treat underlying constipation.  2. Constipation - Continue Benefiber 1 or 2 tablespoons every day consistently. - I gave samples of Linzess 72 1 capsule once daily every day.  If samples work, then we can send prescription.  Also we can increase dose if needed. -  Abd Xray - Evaluate stool burden - Drink 64 ounces of water  daily. - High-fiber diet.  3.  LLQ Pain - Get CT report from Urology - Showed stool ball in recto-sigmoid.  4. GERD - Rx Pepcid (Famotidine) 40mg  1 tablet once daily at bedtime - GERD diet.  Follow up in 4 weeks with TG.  Brigitte Canard, PA-C

## 2023-11-29 ENCOUNTER — Ambulatory Visit (INDEPENDENT_AMBULATORY_CARE_PROVIDER_SITE_OTHER)
Admission: RE | Admit: 2023-11-29 | Discharge: 2023-11-29 | Disposition: A | Discharge status: Home / Self Care | Source: Ambulatory Visit | Attending: Physician Assistant | Admitting: Physician Assistant

## 2023-11-29 ENCOUNTER — Encounter: Payer: Self-pay | Admitting: Physician Assistant

## 2023-11-29 ENCOUNTER — Ambulatory Visit (INDEPENDENT_AMBULATORY_CARE_PROVIDER_SITE_OTHER): Admitting: Physician Assistant

## 2023-11-29 VITALS — BP 138/80 | HR 80 | Ht 66.0 in | Wt 161.0 lb

## 2023-11-29 DIAGNOSIS — R197 Diarrhea, unspecified: Secondary | ICD-10-CM | POA: Diagnosis not present

## 2023-11-29 DIAGNOSIS — K219 Gastro-esophageal reflux disease without esophagitis: Secondary | ICD-10-CM | POA: Diagnosis not present

## 2023-11-29 DIAGNOSIS — R109 Unspecified abdominal pain: Secondary | ICD-10-CM | POA: Diagnosis not present

## 2023-11-29 DIAGNOSIS — R1032 Left lower quadrant pain: Secondary | ICD-10-CM

## 2023-11-29 DIAGNOSIS — K5900 Constipation, unspecified: Secondary | ICD-10-CM

## 2023-11-29 MED ORDER — FAMOTIDINE 40 MG PO TABS: 40.0000 mg | ORAL_TABLET | Freq: Every day | ORAL | 5 refills | Status: DC

## 2023-11-29 NOTE — Patient Instructions (Addendum)
 We have sent the following medications to your pharmacy for you to pick up at your convenience: Famotidine 40 mg at bedtime  Start Samples of Linzess 72mcg 1 capsule once daily before breakfast to help constipation.  Contnue OTC Benefiber Powder. Mix 1 - 2 Tablespoons in 6 - 8 ounces of a Drink Once Daily. Drink 64 ounces of water  / fluids Daily.    Please follow up sooner if symptoms increase or worsen  Due to recent changes in healthcare laws, you may see the results of your imaging and laboratory studies on MyChart before your provider has had a chance to review them.  We understand that in some cases there may be results that are confusing or concerning to you. Not all laboratory results come back in the same time frame and the provider may be waiting for multiple results in order to interpret others.  Please give us  48 hours in order for your provider to thoroughly review all the results before contacting the office for clarification of your results.   _______________________________________________________  If your blood pressure at your visit was 140/90 or greater, please contact your primary care physician to follow up on this.  _______________________________________________________  If you are age 85 or older, your body mass index should be between 23-30. Your Body mass index is 25.99 kg/m. If this is out of the aforementioned range listed, please consider follow up with your Primary Care Provider.  If you are age 36 or younger, your body mass index should be between 19-25. Your Body mass index is 25.99 kg/m. If this is out of the aformentioned range listed, please consider follow up with your Primary Care Provider.   ________________________________________________________  The [facility] providers would like to encourage you to use MYCHART to communicate with providers for non-urgent requests or questions.  Due to long hold times on the telephone, sending your provider a  message by [facility] may be a faster and more efficient way to get a response.  Please allow 85 business hours for a response.  Please remember that this is for non-urgent requests.  _______________________________________________________ Thank you for trusting me with your gastrointestinal care!   Brigitte Canard, PA

## 2023-11-29 NOTE — Progress Notes (Signed)
 Agree with assessment and plan as outlined.

## 2023-12-09 ENCOUNTER — Other Ambulatory Visit: Payer: Self-pay | Admitting: Neurology

## 2023-12-09 ENCOUNTER — Ambulatory Visit: Payer: Self-pay | Admitting: Physician Assistant

## 2023-12-09 NOTE — Telephone Encounter (Signed)
 [name],  I spoke to pt and informed her of x-ray results. Pt states that "I am still having diarrhea and I am not able to go places". She dose not feel that the Linzess helped with symptoms. I discussed your recommendations with pt and she wants to proceed with the colon purge and if that does not help she really would like to know what other options are available. She did have one independent episode of abdominal pain last night in her LLQ described as "just achy and then it went away". Instructions being sent to pt for colon purge are as follows:    7 capfuls of Miralax  in 32 ounces of Gatorade, drink 1 cup every 15 minutes until all the Gatorade is gone.

## 2023-12-09 NOTE — Progress Notes (Signed)
 Call and notify patient:  Hi [name], your abdominal x-ray shows moderate stool throughout the colon consistent with constipation.  There is no evidence of bowel obstruction.  I suspect you are having overflow diarrhea from constipation.  How are the Linzess samples working?  We can increase Linzess dose or give a colon purge if needed.  Let me know if your symptoms are not improving. Barbara Canard, PA-C

## 2023-12-12 DIAGNOSIS — Z6825 Body mass index (BMI) 25.0-25.9, adult: Secondary | ICD-10-CM | POA: Diagnosis not present

## 2023-12-12 DIAGNOSIS — M5416 Radiculopathy, lumbar region: Secondary | ICD-10-CM | POA: Diagnosis not present

## 2023-12-30 NOTE — Progress Notes (Unsigned)
 Barbara Canard, PA-C [address] [place], [place]  [address] Phone: [phone]   Primary Care Physician: Colene Dauphin, MD  Primary Gastroenterologist:  Barbara Canard, PA-C / Alvester Johnson, MD   Chief Complaint:  F/U Chronic Constipation with Overflow Diarrhea; GERD     HPI:   [name] is a 83 y.o. female returns for 1 month follow-up of chronic diarrhea, constipation, and irregular bowel habits ongoing since at least 2018.  Prior cholecystectomy 05/2021.  Last colonoscopy from 2018 showed biopsies negative for microscopic colitis.  No evidence of IBD.  Abdominal pelvic CT through urology and abdominal x-ray through PCP showed moderate constipation.  1 month ago she was treated for constipation with overflow diarrhea.  Started on samples of Linzess 72 mcg daily and Benefiber 1 or 2 tablespoons daily.  Also has history of GERD.  Was started on Pepcid  40 Mg once daily at bedtime.  Current symptoms: Acid reflux has improved on Pepcid .  She has no more nocturnal acid reflux.  She has occasional breakthrough acid regurgitation during the day if she bends over or after eating meal.  She continues to have constipation.  She tried Linzess 72 mcg samples for 1 week with little benefit.  After that she completed a colon purge which helped.  Constipation has returned.  She has no more LLQ abdominal pain.  She is having small hard stools.  Had a bowel movement this morning.  Still has occasional intermittent episode of diarrhea which is less frequent.  Tried Benefiber with no benefit.  12/07/2023 abdominal x-ray: Large volume colonic stool in the ascending and transverse colon.  Small volume stool in the rectosigmoid colon.  No bowel obstruction.   She has Chronic UTIs, on daily antibiotic prophylaxis.  Abdominal pelvic CT through Urologist 3/2025showed large ball of stool in the rectosigmoid colon.  10/2016 colonoscopy by Dr. General Kenner: 5 mm benign colon mucosa polyp removed from  sigmoid colon.  Good prep.  Prominent AVM in the sigmoid colon with no bleeding.  No evidence of IBD.  Biopsies negative for microscopic colitis.  No repeat due to advanced age.  Colonoscopy 12/30/2012 - normal, no biopsies obtained, aborted in sigmoid colon.  Current Outpatient Medications  Medication Sig Dispense Refill   acetaminophen  (TYLENOL ) 500 MG tablet Take 1,000 mg by mouth every morning.     benazepril  (LOTENSIN ) 20 MG tablet TAKE 1 TABLET DAILY 90 tablet 3   cephALEXin (KEFLEX) 250 MG capsule Take 250 mg by mouth daily.     Cholecalciferol (VITAMIN D3) 50 MCG (2000 UT) TABS Take 2,000 Units by mouth at bedtime.     clopidogrel  (PLAVIX ) 75 MG tablet TAKE 1 TABLET(75 MG) BY MOUTH DAILY 30 tablet 2   Cyanocobalamin  (VITAMIN B-12) 5000 MCG TBDP Take 5,000 mcg by mouth in the morning.     diazepam  (VALIUM ) 5 MG tablet Take 1 tablet (5 mg total) by mouth at bedtime. 30 tablet 5   Dulaglutide  (TRULICITY ) 3 MG/0.5ML SOAJ Inject 3 mg as directed once a week. 2 mL 5   escitalopram  (LEXAPRO ) 10 MG tablet TAKE 1 TABLET DAILY 90 tablet 3   estradiol (ESTRACE) 0.1 MG/GM vaginal cream Place 1 Applicatorful vaginally every three (3) days as needed (vaginal dryness).     gabapentin (NEURONTIN) 300 MG capsule Take 300 mg by mouth 2 (two) times daily.     Lancets Misc. MISC UAD to check sugars. E11.9   May substitute to any manufacturer covered by  patient's insurance. 100 each 0   levothyroxine  (SYNTHROID ) 50 MCG tablet TAKE 1 TABLET(50 MCG) BY MOUTH DAILY 90 tablet 3   metFORMIN  (GLUCOPHAGE -XR) 500 MG 24 hr tablet TAKE 2 TABLETS WITH BREAKFAST AND 1 TABLET WITH DINNER 270 tablet 2   Multiple Vitamin (MULTIVITAMIN WITH MINERALS) TABS tablet Take 1 tablet by mouth daily. One A Day for Women     polyethylene glycol (MIRALAX  / GLYCOLAX ) 17 g packet Take 17 g by mouth daily as needed for mild constipation or moderate constipation.     pravastatin  (PRAVACHOL ) 40 MG tablet TAKE 1 TABLET AT BEDTIME 90 tablet  3   Psyllium (HM FIBER POWDER PO) Take 1 Scoop by mouth 3 (three) times a week.     famotidine  (PEPCID ) 40 MG tablet Take 1 tablet (40 mg total) by mouth 2 (two) times daily. 60 tablet 5   No current facility-administered medications for this visit.    Allergies as of 12/31/2023 - Review Complete 12/31/2023  Allergen Reaction Noted   Conjugated estrogens  06/16/2007   Percocet [oxycodone -acetaminophen ] Itching 06/08/2013   Detrol [tolterodine]  07/29/2013    Past Medical History:  Diagnosis Date   Anxiety    Arthritis HANDS, NECK , BACK   Asymptomatic carotid artery stenosis LEFT --  MOD. PER DR WILLIS NOTE OCT 2012   Benign heart murmur    Echo 2011 in Epic   Depression    PMH of   Diabetes mellitus ORAL MED   DVT (deep venous thrombosis) (HCC) 2006   after arthroscopy   Frequency of urination    Headache    History of CVA (cerebrovascular accident) NEUROLOGIST  DR WILLIS----  06-01-2010-  LEFT BASAL GANGLIA HEMORRAGE   RESIDUAL RIGHT HAND NUMBNESS/TINGLING   History of laparoscopic cholecystectomy 05/24/2021   Hyperlipidemia    Hypertension    Hypothyroid    MRSA (methicillin resistant staph aureus) culture positive    X3; last post TKR   Nocturia    Numbness and tingling in right hand RESIDUAL FROM CVA NOV 2011   Obesity    Redux therapy   Osteoporosis    Stroke [facility])    Thyroid  disease    right thyroid  nodule-- BX DONE 08-15-2011    Past Surgical History:  Procedure Laterality Date   ABDOMINAL HYSTERECTOMY  1973   Endometriosis & fibroid   CARPAL TUNNEL RELEASE  2000   RIGHT   CHOLECYSTECTOMY N/A 05/23/2021   Procedure: LAPAROSCOPIC CHOLECYSTECTOMY;  Surgeon: Oza Blumenthal, MD;  Location: [facility];  Service: General;  Laterality: N/A;   COLONOSCOPY  1999 & 2005   Dr Grandville Lax   cystoscope     to evaluate recurrent UTIs   CYSTOSCOPY W/ RETROGRADES  08/22/2011   Procedure: CYSTOSCOPY WITH RETROGRADE PYELOGRAM;  Surgeon: Soledad Dupes, MD;  Location:  [facility];  Service: Urology;  Laterality: Bilateral;   CYSTOSCOPY WITH BIOPSY  08/22/2011   Procedure: CYSTOSCOPY WITH BIOPSY;  Surgeon: Soledad Dupes, MD;  Location: [facility];  Service: Urology;  Laterality: N/A;   g2 p2     JOINT REPLACEMENT  03-28-2006   LEFT THUMB   KNEE ARTHROSCOPY     BILATERAL PRIOR TO TOTAL KNEE   LEFT SHOULDER SURG  1990'S   LUMBAR LAMINECTOMY  08-26-2009   L 4 - 5   THYROID  NODULE BX  08-15-2011   RIGHT   TOTAL KNEE ARTHROPLASTY  05-26-2002      LEFT   AND RIGHT  03-30-2098  WRIST SURGERY  2014   tendon placed in joint; Dr Donzella Galley    Review of Systems:    All systems reviewed and negative except where noted in HPI.    Physical Exam:  BP 120/80 (BP Location: Left Arm, Patient Position: Sitting, Cuff Size: Normal)   Pulse 80   Ht 5' 4.25" (1.632 m) Comment: height measured without shoes  Wt 157 lb 8 oz (71.4 kg)   BMI 26.82 kg/m  No LMP recorded. Patient has had a hysterectomy.  General: Well-nourished, well-developed in no acute distress.  Neuro: Alert and oriented x 3.  Grossly intact.  Psych: Alert and cooperative, normal mood and affect.   Imaging Studies: No results found.  Labs: CBC    Component Value Date/Time   WBC 6.6 07/28/2023 0459   RBC 3.84 (L) 07/28/2023 0459   HGB 12.2 07/28/2023 0459   HCT 34.7 (L) 07/28/2023 0459   PLT 208 07/28/2023 0459   MCV 90.4 07/28/2023 0459   MCV 93.5 07/22/2013 1447   MCH 31.8 07/28/2023 0459   MCHC 35.2 07/28/2023 0459   RDW 12.4 07/28/2023 0459   LYMPHSABS 2.0 07/27/2023 1920   MONOABS 0.6 07/27/2023 1920   EOSABS 0.2 07/27/2023 1920   BASOSABS 0.0 07/27/2023 1920    CMP     Component Value Date/Time   NA 138 07/28/2023 0459   K 3.8 07/28/2023 0459   CL 105 07/28/2023 0459   CO2 22 07/28/2023 0459   GLUCOSE 136 (H) 07/28/2023 0459   BUN 13 07/28/2023 0459   CREATININE 0.66 07/28/2023 0459   CREATININE 0.65 03/09/2020 1443   CALCIUM  9.1 07/28/2023 0459   PROT 5.7 (L) 07/28/2023 0459   ALBUMIN 3.5 07/28/2023 0459   AST 19 07/28/2023 0459   ALT 16 07/28/2023 0459   ALKPHOS 30 (L) 07/28/2023 0459   BILITOT 0.6 07/28/2023 0459   GFRNONAA >60 07/28/2023 0459   GFRNONAA 83 03/09/2020 1443   GFRAA 97 03/09/2020 1443     Assessment and Plan:   [name] is a 83 y.o. y/o female returns for follow-up of:  1.  Chronic constipation with overflow diarrhea - Gave samples of Linzess 145 mcg QD for 1 week, then 290 mcg QD for 1 week.  Patient will let me know which dose works best, and then we can send a prescription.   - Continue drinking 64 ounces of fluids daily and eat high-fiber diet.  2.  GERD - Improved, yet not controlled. - Increase Pepcid  40 Mg to 1 tablet Twice daily before breakfast and bedtime. - Recommend Lifestyle Modifications to prevent Acid Reflux.  Rec. Avoid coffee, sodas, peppermint, garlic, onions, alcohol, citrus fruits, chocolate, tomatoes, fatty and spicey foods.  Avoid eating 2-3 hours before bedtime.    Barbara Canard, PA-C  Follow up in 3 months with TG.

## 2023-12-31 ENCOUNTER — Encounter: Payer: Self-pay | Admitting: Physician Assistant

## 2023-12-31 ENCOUNTER — Ambulatory Visit (INDEPENDENT_AMBULATORY_CARE_PROVIDER_SITE_OTHER): Admitting: Physician Assistant

## 2023-12-31 VITALS — BP 120/80 | HR 80 | Ht 64.25 in | Wt 157.5 lb

## 2023-12-31 DIAGNOSIS — K5909 Other constipation: Secondary | ICD-10-CM | POA: Diagnosis not present

## 2023-12-31 DIAGNOSIS — K219 Gastro-esophageal reflux disease without esophagitis: Secondary | ICD-10-CM

## 2023-12-31 DIAGNOSIS — K5904 Chronic idiopathic constipation: Secondary | ICD-10-CM

## 2023-12-31 MED ORDER — FAMOTIDINE 40 MG PO TABS: 40.0000 mg | ORAL_TABLET | Freq: Two times a day (BID) | ORAL | 5 refills | Status: DC

## 2023-12-31 NOTE — Progress Notes (Signed)
 Agree with assessment and plan as outlined.

## 2023-12-31 NOTE — Patient Instructions (Addendum)
 We have sent the following medications to your pharmacy for you to pick up at your convenience: Famotidine  40 mg twice daily  We have given you samples of the following medication to take: Linzess 145 mcg and Linzess 290 mcg take as directed Linzess works best when taken once a day every day, on an empty stomach, at least 30 minutes before your first meal of the day. Please take the Linzess 145mcg for a week and then take the Linzess 290 mcg the following week.   When Linzess is taken daily as directed:  *Constipation relief is typically felt in about a week *IBS-C patients may begin to experience relief from belly pain and overall abdominal symptoms (pain, discomfort, and bloating) in about 1 week,   with symptoms typically improving over 12 weeks.  Diarrhea may occur in the first 2 weeks -keep taking it.  The diarrhea should go away and you should start having normal, complete, full bowel movements. It may be helpful to start treatment when you can be near the comfort of your own bathroom, such as a weekend.   Please give us  a call in 2 weeks and let us  know which dose of Linzess worked better for you. We'd appreciate your feedback to help guide your treatment plan.  Please follow up sooner if symptoms increase or worsen  Due to recent changes in healthcare laws, you may see the results of your imaging and laboratory studies on MyChart before your provider has had a chance to review them.  We understand that in some cases there may be results that are confusing or concerning to you. Not all laboratory results come back in the same time frame and the provider may be waiting for multiple results in order to interpret others.  Please give us  48 hours in order for your provider to thoroughly review all the results before contacting the office for clarification of your results.   Thank you for trusting me with your gastrointestinal care!   Brigitte Canard,  PA-C _______________________________________________________  If your blood pressure at your visit was 140/90 or greater, please contact your primary care physician to follow up on this.  _______________________________________________________  If you are age 85 or older, your body mass index should be between 23-30. Your Body mass index is 26.82 kg/m. If this is out of the aforementioned range listed, please consider follow up with your Primary Care Provider.  If you are age 94 or younger, your body mass index should be between 19-25. Your Body mass index is 26.82 kg/m. If this is out of the aformentioned range listed, please consider follow up with your Primary Care Provider.   ________________________________________________________  The [facility] providers would like to encourage you to use MYCHART to communicate with providers for non-urgent requests or questions.  Due to long hold times on the telephone, sending your provider a message by [facility] may be a faster and more efficient way to get a response.  Please allow 48 business hours for a response.  Please remember that this is for non-urgent requests.  _______________________________________________________

## 2024-01-20 ENCOUNTER — Other Ambulatory Visit (HOSPITAL_COMMUNITY): Payer: Self-pay

## 2024-01-20 ENCOUNTER — Telehealth: Payer: Self-pay | Admitting: Physician Assistant

## 2024-01-20 ENCOUNTER — Telehealth: Payer: Self-pay

## 2024-01-20 DIAGNOSIS — K5904 Chronic idiopathic constipation: Secondary | ICD-10-CM

## 2024-01-20 MED ORDER — LINACLOTIDE 145 MCG PO CAPS: 145.0000 ug | ORAL_CAPSULE | Freq: Every day | ORAL | 5 refills | Status: DC

## 2024-01-20 NOTE — Telephone Encounter (Signed)
 I spoke with the patient and she stated that she will try the Linzess 145 mcg once every other day and she will call or send us  a MyChart message about how the medication is treating her. The prescription has been sent to her local pharmacy. She is aware and understands.

## 2024-01-20 NOTE — Telephone Encounter (Signed)
 Inbound call from patient, would like to speak to [facility] in regards to Linzess. She states neither dose is working for her and she believes it may be too strong, would like to discuss in regards. Please advise.

## 2024-01-20 NOTE — Telephone Encounter (Signed)
 Pharmacy Patient Advocate Encounter   Received notification from Patient Pharmacy that prior authorization for Linzess 145MCG capsules is required/requested.   Insurance verification completed.   The patient is insured through [employer] .   Prior Authorization for Linzess 145MCG capsules has been APPROVED from 12-21-2023 to 01-19-2025   PA #/Case ID/Reference #: [id number]

## 2024-01-20 NOTE — Addendum Note (Signed)
 Addended by: LANETTE ALETHEA CROME on: 01/20/2024 01:40 PM   Modules accepted: Orders

## 2024-01-20 NOTE — Telephone Encounter (Signed)
 I spoke with [name] and she stated that she tried the 3 doses of Linzess and states the following:  Linzess 72 mcg- Pt states that she was having nothing then started having little poop pebbles. Linzess 145 mcg- Pt states that she had full blown diarrhea. Linzess 290 mcg- Pt states that she has extreme diarrhea   She states that since trying the 290 mcg she has stopped taking her medication and she has not had a bowel movement. She would like to know what steps she should take next.

## 2024-01-22 ENCOUNTER — Other Ambulatory Visit: Payer: Self-pay | Admitting: Internal Medicine

## 2024-02-07 DIAGNOSIS — K5904 Chronic idiopathic constipation: Secondary | ICD-10-CM | POA: Diagnosis not present

## 2024-02-07 DIAGNOSIS — R198 Other specified symptoms and signs involving the digestive system and abdomen: Secondary | ICD-10-CM | POA: Diagnosis not present

## 2024-02-07 DIAGNOSIS — R195 Other fecal abnormalities: Secondary | ICD-10-CM | POA: Diagnosis not present

## 2024-02-07 DIAGNOSIS — R1032 Left lower quadrant pain: Secondary | ICD-10-CM | POA: Diagnosis not present

## 2024-03-03 ENCOUNTER — Ambulatory Visit: Payer: Medicare HMO

## 2024-03-03 VITALS — Ht 64.25 in | Wt 157.0 lb

## 2024-03-03 DIAGNOSIS — Z0000 Encounter for general adult medical examination without abnormal findings: Secondary | ICD-10-CM

## 2024-03-03 NOTE — Progress Notes (Signed)
 Subjective:   [name] is a 83 y.o. who presents for a Medicare Wellness preventive visit.  As a reminder, Annual Wellness Visits don't include a physical exam, and some assessments may be limited, especially if this visit is performed virtually. We may recommend an in-person follow-up visit with your provider if needed.  Visit Complete: Virtual I connected with  [name] on 03/03/24 by a audio enabled telemedicine application and verified that I am speaking with the correct person using two identifiers.  Patient Location: Home  Provider Location: Home Office  I discussed the limitations of evaluation and management by telemedicine. The patient expressed understanding and agreed to proceed.  Vital Signs: Because this visit was a virtual/telehealth visit, some criteria may be missing or patient reported. Any vitals not documented were not able to be obtained and vitals that have been documented are patient reported.  VideoDeclined- This patient declined Librarian, academic. Therefore the visit was completed with audio only.  Persons Participating in Visit: Patient.  AWV Questionnaire: No: Patient Medicare AWV questionnaire was not completed prior to this visit.  Cardiac Risk Factors include: advanced age (>73men, >35 women);hypertension;dyslipidemia;Other (see comment), Risk factor comments: TIA     Objective:    Today's Vitals   03/03/24 0938  Weight: 157 lb (71.2 kg)  Height: 5' 4.25 (1.632 m)   Body mass index is 26.74 kg/m.     03/03/2024   10:20 AM 03/01/2023   10:01 AM 05/31/2022    9:27 AM 03/07/2022    2:06 PM 05/22/2021    1:16 PM 05/15/2021    9:54 AM 03/01/2021    8:54 AM  Advanced Directives  Does Patient Have a Medical Advance Directive? Yes Yes Yes Yes Yes No Yes  Type of Estate agent of [place];Living will Healthcare Power of [place];Living will Living will Living will;Healthcare Power of [employer] Power of [place];Living will  Living will;Healthcare Power of Attorney  Does patient want to make changes to medical advance directive? No - Patient declined  No - Patient declined No - Patient declined   No - Patient declined  Copy of Healthcare Power of Attorney in Chart? Yes - validated most recent copy scanned in chart (See row information) No - copy requested  No - copy requested No - copy requested  No - copy requested  Would patient like information on creating a medical advance directive?      No - Patient declined     Current Medications (verified) Outpatient Encounter Medications as of 03/03/2024  Medication Sig   acetaminophen  (TYLENOL ) 500 MG tablet Take 1,000 mg by mouth every morning.   benazepril  (LOTENSIN ) 20 MG tablet TAKE 1 TABLET DAILY   cephALEXin (KEFLEX) 250 MG capsule Take 250 mg by mouth daily.   Cholecalciferol (VITAMIN D3) 50 MCG (2000 UT) TABS Take 2,000 Units by mouth at bedtime.   clopidogrel  (PLAVIX ) 75 MG tablet TAKE 1 TABLET(75 MG) BY MOUTH DAILY   cyanocobalamin  ([employer] VITAMIN B12) 1000 MCG tablet Take by mouth.   Cyanocobalamin  (VITAMIN B-12) 5000 MCG TBDP Take 5,000 mcg by mouth in the morning.   diazepam  (VALIUM ) 5 MG tablet Take 1 tablet (5 mg total) by mouth at bedtime.   Dulaglutide  (TRULICITY ) 3 MG/0.5ML SOAJ Inject 3 mg as directed once a week.   escitalopram  (LEXAPRO ) 10 MG tablet TAKE 1 TABLET DAILY   estradiol (ESTRACE) 0.1 MG/GM vaginal cream Place 1 Applicatorful vaginally every three (3) days as  needed (vaginal dryness).   famotidine  (PEPCID ) 40 MG tablet Take 1 tablet (40 mg total) by mouth 2 (two) times daily.   FIBER PO Take by mouth.   gabapentin (NEURONTIN) 300 MG capsule Take 300 mg by mouth 2 (two) times daily.   Lancets Misc. MISC UAD to check sugars. E11.9   May substitute to any manufacturer covered by patient's insurance.   levothyroxine  (SYNTHROID ) 50 MCG tablet TAKE 1 TABLET(50 MCG) BY MOUTH DAILY   lubiprostone (AMITIZA) 8  MCG capsule 8 mcg 2 (two) times daily with a meal.   metFORMIN  (GLUCOPHAGE -XR) 500 MG 24 hr tablet TAKE 2 TABLETS WITH BREAKFAST AND 1 TABLET WITH DINNER   Multiple Vitamin (MULTIVITAMIN WITH MINERALS) TABS tablet Take 1 tablet by mouth daily. One A Day for Women   polyethylene glycol (MIRALAX  / GLYCOLAX ) 17 g packet Take 17 g by mouth daily as needed for mild constipation or moderate constipation.   pravastatin  (PRAVACHOL ) 40 MG tablet TAKE 1 TABLET AT BEDTIME   Psyllium (HM FIBER POWDER PO) Take 1 Scoop by mouth 3 (three) times a week.   Vitamin D -Vitamin K (D3 + K2 DOTS) 1000-90 UNIT-MCG TABS Take by mouth.   [DISCONTINUED] linaclotide  (LINZESS ) 145 MCG CAPS capsule Take 1 capsule (145 mcg total) by mouth daily before breakfast.   No facility-administered encounter medications on file as of 03/03/2024.    Allergies (verified) Conjugated estrogens, Percocet [oxycodone -acetaminophen ], and Detrol [tolterodine]   History: Past Medical History:  Diagnosis Date   Anxiety    Arthritis HANDS, NECK , BACK   Asymptomatic carotid artery stenosis LEFT --  MOD. PER DR WILLIS NOTE OCT 2012   Benign heart murmur    Echo 2011 in Epic   Depression    PMH of   Diabetes mellitus ORAL MED   DVT (deep venous thrombosis) (HCC) 2006   after arthroscopy   Frequency of urination    Headache    History of CVA (cerebrovascular accident) NEUROLOGIST  DR WILLIS----  06-01-2010-  LEFT BASAL GANGLIA HEMORRAGE   RESIDUAL RIGHT HAND NUMBNESS/TINGLING   History of laparoscopic cholecystectomy 05/24/2021   Hyperlipidemia    Hypertension    Hypothyroid    MRSA (methicillin resistant staph aureus) culture positive    X3; last post TKR   Nocturia    Numbness and tingling in right hand RESIDUAL FROM CVA NOV 2011   Obesity    Redux therapy   Osteoporosis    Stroke [facility])    Thyroid  disease    right thyroid  nodule-- BX DONE 08-15-2011   Past Surgical History:  Procedure Laterality Date   ABDOMINAL  HYSTERECTOMY  1973   Endometriosis & fibroid   CARPAL TUNNEL RELEASE  2000   RIGHT   CHOLECYSTECTOMY N/A 05/23/2021   Procedure: LAPAROSCOPIC CHOLECYSTECTOMY;  Surgeon: Vernetta Berg, MD;  Location: [facility];  Service: General;  Laterality: N/A;   COLONOSCOPY  1999 & 2005   Dr Obie   cystoscope     to evaluate recurrent UTIs   CYSTOSCOPY W/ RETROGRADES  08/22/2011   Procedure: CYSTOSCOPY WITH RETROGRADE PYELOGRAM;  Surgeon: Toribio Neysa Repine, MD;  Location[facility];  Service: Urology;  Laterality: Bilateral;   CYSTOSCOPY WITH BIOPSY  08/22/2011   Procedure: CYSTOSCOPY WITH BIOPSY;  Surgeon: Toribio Neysa Repine, MD;  Location: [facility];  Service: Urology;  Laterality: N/A;   g2 p2     JOINT REPLACEMENT  03-28-2006   LEFT THUMB   KNEE ARTHROSCOPY  BILATERAL PRIOR TO TOTAL KNEE   LEFT SHOULDER SURG  1990'S   LUMBAR LAMINECTOMY  08-26-2009   L 4 - 5   THYROID  NODULE BX  08-15-2011   RIGHT   TOTAL KNEE ARTHROPLASTY  05-26-2002      LEFT   AND RIGHT  03-30-2098   WRIST SURGERY  2014   tendon placed in joint; Dr Sissy   Family History  Problem Relation Age of Onset   Bladder Cancer Mother    Diabetes Mother    Brain cancer Sister    Dementia Sister    Diabetes Sister 50       TYPE 1    Dementia Sister    Pancreatic cancer Sister    Kidney failure Sister        in context of DM & influenza   Dementia Brother    Prostate cancer Brother    Alzheimer's disease Brother    Diabetes Maternal Aunt    Diabetes Maternal Uncle    Diabetes Maternal Grandmother        Type 2   Diabetes Maternal Grandfather        Type 2   Stroke Paternal Grandfather        in 53s   Dementia Daughter    Heart disease Neg Hx    Colon cancer Neg Hx    Esophageal cancer Neg Hx    Stomach cancer Neg Hx    Social History   Socioeconomic History   Marital status: Widowed    Spouse name: Not on file   Number of children: 2   Years of education: Not  on file   Highest education level: Not on file  Occupational History   Occupation: Retired  Tobacco Use   Smoking status: Never   Smokeless tobacco: Never  Vaping Use   Vaping status: Never Used  Substance and Sexual Activity   Alcohol use: Yes    Comment:  very rarely , < 1 glass wine / month   Drug use: No   Sexual activity: Not Currently    Birth control/protection: Surgical  Other Topics Concern   Not on file  Social History Narrative   Lives with Daughter and granddaughter/2025   Social Drivers of Health   Financial Resource Strain: Low Risk  (03/03/2024)   Overall Financial Resource Strain (CARDIA)    Difficulty of Paying Living Expenses: Not hard at all  Food Insecurity: No Food Insecurity (03/03/2024)   Hunger Vital Sign    Worried About Running Out of Food in the Last Year: Never true    Ran Out of Food in the Last Year: Never true  Transportation Needs: No Transportation Needs (03/03/2024)   PRAPARE - Administrator, Civil Service (Medical): No    Lack of Transportation (Non-Medical): No  Physical Activity: Inactive (03/03/2024)   Exercise Vital Sign    Days of Exercise per Week: 0 days    Minutes of Exercise per Session: 0 min  Stress: No Stress Concern Present (03/03/2024)   [employer] of Occupational Health - Occupational Stress Questionnaire    Feeling of Stress: Only a little  Social Connections: Moderately Integrated (03/03/2024)   Social Connection and Isolation Panel    Frequency of Communication with Friends and Family: More than three times a week    Frequency of Social Gatherings with Friends and Family: Three times a week    Attends Religious Services: More than 4 times per year    Active Member  of Clubs or Organizations: Yes    Attends Banker Meetings: Never    Marital Status: Widowed    Tobacco Counseling Counseling given: Not Answered    Clinical Intake:  Pre-visit preparation completed: Yes  Pain :  No/denies pain     BMI - recorded: 26.74 Nutritional Status: BMI 25 -29 Overweight Nutritional Risks: Nausea/ vomitting/ diarrhea (diarrhea sometimes) Diabetes: Yes CBG done?: Yes (188-fasting-per pt) CBG resulted in Enter/ Edit results?: No Did pt. bring in CBG monitor from home?: No  Lab Results  Component Value Date   HGBA1C 6.7 (H) 07/28/2023   HGBA1C 5.8 04/02/2023   HGBA1C 5.9 09/25/2022     How often do you need to have someone help you when you read instructions, pamphlets, or other written materials from your doctor or pharmacy?: 1 - Never  Interpreter Needed?: No  Information entered by :: Browning [place], RMA   Activities of Daily Living     03/03/2024   10:07 AM  In your present state of health, do you have any difficulty performing the following activities:  Hearing? 0  Vision? 0  Difficulty concentrating or making decisions? 0  Walking or climbing stairs? 0  Dressing or bathing? 0  Doing errands, shopping? 0  Preparing Food and eating ? N  Using the Toilet? N  In the past six months, have you accidently leaked urine? Y  Do you have problems with loss of bowel control? Y  Managing your Medications? N  Managing your Finances? N  Housekeeping or managing your Housekeeping? N    Patient Care Team: Geofm Glade PARAS, MD as PCP - General (Internal Medicine) Jarold Mayo, MD as Consulting Physician (Ophthalmology) [facility], [place] as Consulting Physician (Optometry)  I have updated your Care Teams any recent Medical Services you may have received from other providers in the past year.     Assessment:   This is a routine wellness examination for [name].  Hearing/Vision screen Hearing Screening - Comments:: Denies hearing difficulties   Vision Screening - Comments:: Wears contact lenses/[facility]   Goals Addressed   None    Depression Screen     03/03/2024   10:24 AM 04/02/2023   11:28 AM 03/01/2023   10:10 AM 09/25/2022   11:24 AM  08/10/2022   11:08 AM 08/10/2022   11:07 AM 03/07/2022    2:10 PM  PHQ 2/9 Scores  PHQ - 2 Score 1 2 1  0 1 1 2   PHQ- 9 Score 1 4 4 2 4  4     Fall Risk     03/03/2024   10:20 AM 04/02/2023   11:28 AM 03/01/2023   10:02 AM 09/25/2022   11:24 AM 08/10/2022   11:07 AM  Fall Risk   Falls in the past year? 0 0 1 0 0  Number falls in past yr: 0 0 1 0 0  Injury with Fall? 0 0 1 0 0  Risk for fall due to :  No Fall Risks Medication side effect No Fall Risks No Fall Risks  Follow up Falls evaluation completed;Falls prevention discussed Falls evaluation completed Falls evaluation completed;Falls prevention discussed Falls evaluation completed Falls evaluation completed      Data saved with a previous flowsheet row definition    MEDICARE RISK AT HOME:  Medicare Risk at Home Any stairs in or around the home?: No If so, are there any without handrails?: No Home free of loose throw rugs in walkways, pet beds, electrical  cords, etc?: Yes Adequate lighting in your home to reduce risk of falls?: Yes Life alert?: No Use of a cane, walker or w/c?: No Grab bars in the bathroom?: Yes Shower chair or bench in shower?: Yes Elevated toilet seat or a handicapped toilet?: Yes  TIMED UP AND GO:  Was the test performed?  No  Cognitive Function: 6CIT completed    03/25/2018   11:47 AM 03/19/2017   12:17 PM 01/12/2015    9:27 AM  MMSE - Mini Mental State Exam  Not completed:   Unable to complete  Orientation to time 5 5    Orientation to Place 5 5    Registration 3 3    Attention/ Calculation 5 5    Recall 3 2    Language- name 2 objects 2 2    Language- repeat 1 1   Language- follow 3 step command 3 3    Language- read & follow direction 1 1    Write a sentence 1 1    Copy design 1 1    Total score 30 29       Data saved with a previous flowsheet row definition        03/03/2024   10:21 AM 03/01/2023   10:05 AM 03/07/2022    2:18 PM  6CIT Screen  What Year? 0 points 0 points 0 points  What  month? 0 points 0 points 0 points  What time? 0 points 0 points 0 points  Count back from 20 0 points 0 points 0 points  Months in reverse 0 points 0 points 0 points  Repeat phrase 0 points 0 points 0 points  Total Score 0 points 0 points 0 points    Immunizations Immunization History  Administered Date(s) Administered   Fluad Quad(high Dose 65+) 05/06/2022   Influenza Whole 07/24/1999, 06/13/2005, 06/17/2007, 04/14/2008, 04/11/2010   Influenza, High Dose Seasonal PF 03/28/2016, 03/19/2017, 03/25/2018   Influenza, Seasonal, Injecte, Preservative Fre 06/15/2014   Influenza-Unspecified 04/22/2013, 05/02/2015   PFIZER(Purple Top)SARS-COV-2 Vaccination 08/11/2019, 09/01/2019, 04/28/2020   Pneumococcal Conjugate-13 01/12/2015   Pneumococcal Polysaccharide-23 08/03/2004   Td 08/05/1996, 09/29/2010   Zoster Recombinant(Shingrix) 01/06/2018, 03/22/2018   Zoster, Live 12/21/2010    Screening Tests Health Maintenance  Topic Date Due   DTaP/Tdap/Td (3 - Tdap) 09/28/2020   COVID-19 Vaccine (4 - 2024-25 season) 03/24/2023   FOOT EXAM  03/28/2023   HEMOGLOBIN A1C  01/25/2024   INFLUENZA VACCINE  02/21/2024   OPHTHALMOLOGY EXAM  05/19/2024   Diabetic kidney evaluation - eGFR measurement  07/27/2024   DEXA SCAN  09/15/2024   Diabetic kidney evaluation - Urine ACR  11/12/2024   Medicare Annual Wellness (AWV)  03/03/2025   Pneumococcal Vaccine: 50+ Years  Completed   Zoster Vaccines- Shingrix  Completed   Hepatitis B Vaccines  Aged Out   HPV VACCINES  Aged Out   Meningococcal B Vaccine  Aged Out    Health Maintenance  Health Maintenance Due  Topic Date Due   DTaP/Tdap/Td (3 - Tdap) 09/28/2020   COVID-19 Vaccine (4 - 2024-25 season) 03/24/2023   FOOT EXAM  03/28/2023   HEMOGLOBIN A1C  01/25/2024   INFLUENZA VACCINE  02/21/2024   Health Maintenance Items Addressed: Mammogram scheduled, Diabetic Foot Exam recommended, Labs Ordered: DUE FOR A1C CHECK, See Nurse Notes at the end of  this note  Additional Screening:  Vision Screening: Recommended annual ophthalmology exams for early detection of glaucoma and other disorders of the eye. Would you like a  referral to an eye doctor? No    Dental Screening: Recommended annual dental exams for proper oral hygiene  Community Resource Referral / Chronic Care Management: CRR required this visit?  No   CCM required this visit?  No   Plan:    I have personally reviewed and noted the following in the patient's chart:   Medical and social history Use of alcohol, tobacco or illicit drugs  Current medications and supplements including opioid prescriptions. Patient is not currently taking opioid prescriptions. Functional ability and status Nutritional status Physical activity Advanced directives List of other physicians Hospitalizations, surgeries, and ER visits in previous 12 months Vitals Screenings to include cognitive, depression, and falls Referrals and appointments  In addition, I have reviewed and discussed with patient certain preventive protocols, quality metrics, and best practice recommendations. A written personalized care plan for preventive services as well as general preventive health recommendations were provided to patient.   Karington Zarazua L Kavian Peters, CMA   03/03/2024   After Visit Summary: (MyChart) Due to this being a telephonic visit, the after visit summary with patients personalized plan was offered to patient via MyChart   Notes: Patient is due for a Tdap.  She is also due for a foot exam and a A1C check and will get that done during her next office visit No orders have been placed today).  Patient is already scheduled to have a mammogram in Sept 2025 and an eye exam in Oct 2025-per pt. She had no other concerns to address today.

## 2024-03-03 NOTE — Patient Instructions (Addendum)
 [name] , Thank you for taking time out of your busy schedule to complete your Annual Wellness Visit with me. I enjoyed our conversation and look forward to speaking with you again next year. I, as well as your care team,  appreciate your ongoing commitment to your health goals. Please review the following plan we discussed and let me know if I can assist you in the future. Your Game plan/ To Do List    Referrals: If you haven't heard from the office you've been referred to, please reach out to them at the phone provided.   Follow up Visits: We will see or speak with you next year for your Next Medicare AWV with our clinical staff Have you seen your provider in the last 6 months (3 months if uncontrolled diabetes)? Yes. Next OV on 03/30/2024.  Clinician Recommendations:  Aim for 30 minutes of exercise or brisk walking, 6-8 glasses of water , and 5 servings of fruits and vegetables each day. You are due for a Tetanus vaccine and you can get that done at the pharmacy.  You are also for a foot exam and an A1C check and wil have that done during your up coming office visit with Dr. Geofm.        This is a list of the screenings recommended for you:  Health Maintenance  Topic Date Due   DTaP/Tdap/Td vaccine (3 - Tdap) 09/28/2020   COVID-19 Vaccine (4 - 2024-25 season) 03/24/2023   Complete foot exam   03/28/2023   Hemoglobin A1C  01/25/2024   Flu Shot  02/21/2024   Eye exam for diabetics  05/19/2024   Yearly kidney function blood test for diabetes  07/27/2024   DEXA scan (bone density measurement)  09/15/2024   Yearly kidney health urinalysis for diabetes  11/12/2024   Medicare Annual Wellness Visit  03/03/2025   Pneumococcal Vaccine for age over 35  Completed   Zoster (Shingles) Vaccine  Completed   Hepatitis B Vaccine  Aged Out   HPV Vaccine  Aged Out   Meningitis B Vaccine  Aged Out    Advanced directives: (In Chart) A copy of your advanced directives are scanned into your chart should  your provider ever need it. Advance Care Planning is important because it:  [x]  Makes sure you receive the medical care that is consistent with your values, goals, and preferences  [x]  It provides guidance to your family and loved ones and reduces their decisional burden about whether or not they are making the right decisions based on your wishes.  Follow the link provided in your after visit summary or read over the paperwork we have mailed to you to help you started getting your Advance Directives in place. If you need assistance in completing these, please reach out to us  so that we can help you!  See attachments for Preventive Care and Fall Prevention Tips.

## 2024-03-12 DIAGNOSIS — M5416 Radiculopathy, lumbar region: Secondary | ICD-10-CM | POA: Diagnosis not present

## 2024-03-16 ENCOUNTER — Other Ambulatory Visit: Payer: Self-pay | Admitting: Neurology

## 2024-03-17 ENCOUNTER — Telehealth: Payer: Self-pay

## 2024-03-17 ENCOUNTER — Telehealth: Payer: Self-pay | Admitting: Neurology

## 2024-03-17 NOTE — Telephone Encounter (Signed)
 Received medical clearance for L5-S1 ESI requesting to hold Plavix  7 days prior, resume the day after.  Possible TIA event in January 2025, clearance form signed.

## 2024-03-17 NOTE — Telephone Encounter (Signed)
 Faxed ppw to [facility].

## 2024-03-17 NOTE — Telephone Encounter (Signed)
 faxed

## 2024-03-23 ENCOUNTER — Other Ambulatory Visit: Payer: Self-pay | Admitting: Internal Medicine

## 2024-03-27 DIAGNOSIS — K5904 Chronic idiopathic constipation: Secondary | ICD-10-CM | POA: Diagnosis not present

## 2024-03-27 DIAGNOSIS — R198 Other specified symptoms and signs involving the digestive system and abdomen: Secondary | ICD-10-CM | POA: Diagnosis not present

## 2024-03-27 DIAGNOSIS — R1032 Left lower quadrant pain: Secondary | ICD-10-CM | POA: Diagnosis not present

## 2024-03-27 DIAGNOSIS — R195 Other fecal abnormalities: Secondary | ICD-10-CM | POA: Diagnosis not present

## 2024-03-29 ENCOUNTER — Encounter: Payer: Self-pay | Admitting: Internal Medicine

## 2024-03-29 NOTE — Progress Notes (Unsigned)
 Subjective:    Patient ID: [name], female    DOB: 18-May-1941, 83 y.o.   MRN: [id number]      HPI Chrystian is here for a Physical exam and her chronic medical problems.    Taking gabapentin for sciatica 1-2 a day- getting shots for back q 3 months.  Her back pain is chronic and it does limit what she can do.  She is not exercising regularly like she used to because of the pain.   Still having stomach issues - starts out as constipation.  Yesterday had lunch out and had not had a BM x 4 days and when she got home had diarrhea.   Taking metamucil 2 servings a day.  She is trying to drink more water .  She has seen GI.  She has been tried on Linzess  and Amitiza without improvement.     Medications and allergies reviewed with patient and updated if appropriate.  Current Outpatient Medications on File Prior to Visit  Medication Sig Dispense Refill   acetaminophen  (TYLENOL ) 500 MG tablet Take 1,000 mg by mouth every morning.     benazepril  (LOTENSIN ) 20 MG tablet TAKE 1 TABLET DAILY 90 tablet 3   cephALEXin (KEFLEX) 250 MG capsule Take 250 mg by mouth daily.     Cholecalciferol (VITAMIN D3) 50 MCG (2000 UT) TABS Take 2,000 Units by mouth at bedtime.     clopidogrel  (PLAVIX ) 75 MG tablet TAKE 1 TABLET(75 MG) BY MOUTH DAILY 30 tablet 2   cyanocobalamin  ([employer] VITAMIN B12) 1000 MCG tablet Take by mouth.     Cyanocobalamin  (VITAMIN B-12) 5000 MCG TBDP Take 5,000 mcg by mouth in the morning.     diazepam  (VALIUM ) 5 MG tablet Take 1 tablet (5 mg total) by mouth at bedtime. 30 tablet 5   escitalopram  (LEXAPRO ) 10 MG tablet TAKE 1 TABLET DAILY 90 tablet 3   estradiol (ESTRACE) 0.1 MG/GM vaginal cream Place 1 Applicatorful vaginally every three (3) days as needed (vaginal dryness).     famotidine  (PEPCID ) 40 MG tablet Take 1 tablet (40 mg total) by mouth 2 (two) times daily. 60 tablet 5   FIBER PO Take by mouth.     gabapentin (NEURONTIN) 300 MG capsule Take 300 mg by mouth 2 (two) times  daily.     Lancets Misc. MISC UAD to check sugars. E11.9   May substitute to any manufacturer covered by patient's insurance. 100 each 0   levothyroxine  (SYNTHROID ) 50 MCG tablet TAKE 1 TABLET(50 MCG) BY MOUTH DAILY 90 tablet 3   Multiple Vitamin (MULTIVITAMIN WITH MINERALS) TABS tablet Take 1 tablet by mouth daily. One A Day for Women     Na Sulfate-K Sulfate-Mg Sulfate concentrate (SUPREP) 17.5-3.13-1.6 GM/177ML SOLN Take 177 mLs by mouth.     polyethylene glycol (MIRALAX  / GLYCOLAX ) 17 g packet Take 17 g by mouth daily as needed for mild constipation or moderate constipation.     pravastatin  (PRAVACHOL ) 40 MG tablet TAKE 1 TABLET AT BEDTIME 90 tablet 3   Psyllium (HM FIBER POWDER PO) Take 1 Scoop by mouth 3 (three) times a week.     Vitamin D -Vitamin K (D3 + K2 DOTS) 1000-90 UNIT-MCG TABS Take by mouth.     lubiprostone (AMITIZA) 8 MCG capsule 8 mcg 2 (two) times daily with a meal. (Patient not taking: Reported on 03/30/2024)     No current facility-administered medications on file prior to visit.    Review of Systems  Constitutional:  Negative  for fever.  Eyes:  Positive for visual disturbance (blurry vistion - sees retina specialist - has tear).  Respiratory:  Negative for cough, shortness of breath and wheezing.   Cardiovascular:  Positive for chest pain (with gerd in past). Negative for palpitations and leg swelling.  Gastrointestinal:  Positive for abdominal pain (occ b/l lower abdominal pain - frequent x several months), constipation (chronic) and diarrhea. Negative for blood in stool.       Occ gerd  Genitourinary:  Negative for dysuria.  Musculoskeletal:  Positive for arthralgias and back pain.  Skin:  Negative for rash.  Neurological:  Positive for dizziness (once in a while) and light-headedness (once in a while). Negative for headaches.       Intermittent burning sensation right plantar foot  Hematological:  Bruises/bleeds easily (easy bruising).  Psychiatric/Behavioral:   Negative for dysphoric mood. The patient is not nervous/anxious.        Objective:   Vitals:   03/30/24 1001  BP: 130/74  Pulse: 65  Temp: 98 F (36.7 C)  SpO2: 97%   Filed Weights   03/30/24 1001  Weight: 147 lb (66.7 kg)   Body mass index is 25.04 kg/m.  BP Readings from Last 3 Encounters:  03/30/24 130/74  12/31/23 120/80  11/29/23 138/80    Wt Readings from Last 3 Encounters:  03/30/24 147 lb (66.7 kg)  03/03/24 157 lb (71.2 kg)  12/31/23 157 lb 8 oz (71.4 kg)       Physical Exam Constitutional: She appears well-developed and well-nourished. No distress.  HENT:  Head: Normocephalic and atraumatic.  Right Ear: External ear normal. Normal ear canal and TM Left Ear: External ear normal.  Normal ear canal and TM Mouth/Throat: Oropharynx is clear and moist.  Eyes: Conjunctivae normal.  Neck: Neck supple. No tracheal deviation present. No thyromegaly present.  No carotid bruit  Cardiovascular: Normal rate, regular rhythm and normal heart sounds.   No murmur heard.  No edema. Pulmonary/Chest: Effort normal and breath sounds normal. No respiratory distress. She has no wheezes. She has no rales.  Breast: deferred   Abdominal: Soft. She exhibits diffuse mild tenderness.  No guarding or rebound.  No distention.  Lymphadenopathy: She has no cervical adenopathy.  Skin: Skin is warm and dry. She is not diaphoretic.  Psychiatric: She has a normal mood and affect. Her behavior is normal.   Diabetic Foot Exam - Simple   Simple Foot Form Diabetic Foot exam was performed with the following findings: Yes 03/30/2024 12:44 PM  Visual Inspection No deformities, no ulcerations, no other skin breakdown bilaterally: Yes Sensation Testing See comments: Yes Pulse Check Posterior Tibialis and Dorsalis pulse intact bilaterally: Yes Comments Intact sensation to light touch left foot, decreased light touch sensation in the right foot      Lab Results  Component Value Date    WBC 6.6 07/28/2023   HGB 12.2 07/28/2023   HCT 34.7 (L) 07/28/2023   PLT 208 07/28/2023   GLUCOSE 136 (H) 07/28/2023   CHOL 132 07/28/2023   TRIG 116 07/28/2023   HDL 57 07/28/2023   LDLDIRECT 61.0 06/30/2018   LDLCALC 52 07/28/2023   ALT 16 07/28/2023   AST 19 07/28/2023   NA 138 07/28/2023   K 3.8 07/28/2023   CL 105 07/28/2023   CREATININE 0.66 07/28/2023   BUN 13 07/28/2023   CO2 22 07/28/2023   TSH 3.73 04/02/2023   INR 1.8 (H) 04/02/2008   HGBA1C 6.7 (H) 07/28/2023   MICROALBUR  3.9 (H) 11/13/2023         Assessment & Plan:   Physical exam: Screening blood work  ordered Exercise  none -- related to back pain Weight  is good Substance abuse  none   Reviewed recommended immunizations.  Flu immunization administered today.     Health Maintenance  Topic Date Due   DTaP/Tdap/Td (3 - Tdap) 09/28/2020   DEXA SCAN  09/15/2022   HEMOGLOBIN A1C  01/25/2024   Influenza Vaccine  02/21/2024   COVID-19 Vaccine (4 - 2025-26 season) 04/15/2024 (Originally 03/23/2024)   OPHTHALMOLOGY EXAM  05/19/2024   Diabetic kidney evaluation - eGFR measurement  07/27/2024   Diabetic kidney evaluation - Urine ACR  11/12/2024   Medicare Annual Wellness (AWV)  03/03/2025   FOOT EXAM  03/30/2025   Pneumococcal Vaccine: 50+ Years  Completed   Zoster Vaccines- Shingrix  Completed   HPV VACCINES  Aged Out   Meningococcal B Vaccine  Aged Out          See Problem List for Assessment and Plan of chronic medical problems.

## 2024-03-29 NOTE — Patient Instructions (Addendum)
 Flu immunization administered today.     Blood work was ordered.       Medications changes include :   stop the trulicity   For the constipation  Docusate or colace 1-3 pills a day Probiotics miralax  daily Smooth move tea Prunes, prune juice, dates or raisons Magnesium glycinate supplement over the counter     Return in about 3 months (around 06/29/2024) for follow up, Schedule DEXA-[facility].   Health Maintenance, Female Adopting a healthy lifestyle and getting preventive care are important in promoting health and wellness. Ask your health care provider about: The right schedule for you to have regular tests and exams. Things you can do on your own to prevent diseases and keep yourself healthy. What should I know about diet, weight, and exercise? Eat a healthy diet  Eat a diet that includes plenty of vegetables, fruits, low-fat dairy products, and lean protein. Do not eat a lot of foods that are high in solid fats, added sugars, or sodium. Maintain a healthy weight Body mass index (BMI) is used to identify weight problems. It estimates body fat based on height and weight. Your health care provider can help determine your BMI and help you achieve or maintain a healthy weight. Get regular exercise Get regular exercise. This is one of the most important things you can do for your health. Most adults should: Exercise for at least 150 minutes each week. The exercise should increase your heart rate and make you sweat (moderate-intensity exercise). Do strengthening exercises at least twice a week. This is in addition to the moderate-intensity exercise. Spend less time sitting. Even light physical activity can be beneficial. Watch cholesterol and blood lipids Have your blood tested for lipids and cholesterol at 85 years of age, then have this test every 5 years. Have your cholesterol levels checked more often if: Your lipid or cholesterol levels are high. You are older than 85 years  of age. You are at high risk for heart disease. What should I know about cancer screening? Depending on your health history and family history, you may need to have cancer screening at various ages. This may include screening for: Breast cancer. Cervical cancer. Colorectal cancer. Skin cancer. Lung cancer. What should I know about heart disease, diabetes, and high blood pressure? Blood pressure and heart disease High blood pressure causes heart disease and increases the risk of stroke. This is more likely to develop in people who have high blood pressure readings or are overweight. Have your blood pressure checked: Every 3-5 years if you are 74-60 years of age. Every year if you are 68 years old or older. Diabetes Have regular diabetes screenings. This checks your fasting blood sugar level. Have the screening done: Once every three years after age 89 if you are at a normal weight and have a low risk for diabetes. More often and at a younger age if you are overweight or have a high risk for diabetes. What should I know about preventing infection? Hepatitis B If you have a higher risk for hepatitis B, you should be screened for this virus. Talk with your health care provider to find out if you are at risk for hepatitis B infection. Hepatitis C Testing is recommended for: Everyone born from [birth year] through 1967. Anyone with known risk factors for hepatitis C. Sexually transmitted infections (STIs) Get screened for STIs, including gonorrhea and chlamydia, if: You are sexually active and are younger than 85 years of age. You are older than 85 years  of age and your health care provider tells you that you are at risk for this type of infection. Your sexual activity has changed since you were last screened, and you are at increased risk for chlamydia or gonorrhea. Ask your health care provider if you are at risk. Ask your health care provider about whether you are at high risk for HIV. Your  health care provider may recommend a prescription medicine to help prevent HIV infection. If you choose to take medicine to prevent HIV, you should first get tested for HIV. You should then be tested every 3 months for as long as you are taking the medicine. Pregnancy If you are about to stop having your period (premenopausal) and you may become pregnant, seek counseling before you get pregnant. Take 400 to 800 micrograms (mcg) of folic acid every day if you become pregnant. Ask for birth control (contraception) if you want to prevent pregnancy. Osteoporosis and menopause Osteoporosis is a disease in which the bones lose minerals and strength with aging. This can result in bone fractures. If you are 66 years old or older, or if you are at risk for osteoporosis and fractures, ask your health care provider if you should: Be screened for bone loss. Take a calcium or vitamin D  supplement to lower your risk of fractures. Be given hormone replacement therapy (HRT) to treat symptoms of menopause. Follow these instructions at home: Alcohol use Do not drink alcohol if: Your health care provider tells you not to drink. You are pregnant, may be pregnant, or are planning to become pregnant. If you drink alcohol: Limit how much you have to: 0-1 drink a day. Know how much alcohol is in your drink. In the [place] one drink equals one 12 oz bottle of beer (355 mL), one 5 oz glass of wine (148 mL), or one 1 oz glass of hard liquor (44 mL). Lifestyle Do not use any products that contain nicotine or tobacco. These products include cigarettes, chewing tobacco, and vaping devices, such as e-cigarettes. If you need help quitting, ask your health care provider. Do not use street drugs. Do not share needles. Ask your health care provider for help if you need support or information about quitting drugs. General instructions Schedule regular health, dental, and eye exams. Stay current with your vaccines. Tell your  health care provider if: You often feel depressed. You have ever been abused or do not feel safe at home. Summary Adopting a healthy lifestyle and getting preventive care are important in promoting health and wellness. Follow your health care provider's instructions about healthy diet, exercising, and getting tested or screened for diseases. Follow your health care provider's instructions on monitoring your cholesterol and blood pressure. This information is not intended to replace advice given to you by your health care provider. Make sure you discuss any questions you have with your health care provider. Document Revised: 11/28/2020 Document Reviewed: 11/28/2020 Elsevier Patient Education  2024 [employer].

## 2024-03-30 ENCOUNTER — Ambulatory Visit: Admitting: Internal Medicine

## 2024-03-30 VITALS — BP 130/74 | HR 65 | Temp 98.0°F | Wt 147.0 lb

## 2024-03-30 DIAGNOSIS — Z7984 Long term (current) use of oral hypoglycemic drugs: Secondary | ICD-10-CM

## 2024-03-30 DIAGNOSIS — I10 Essential (primary) hypertension: Secondary | ICD-10-CM

## 2024-03-30 DIAGNOSIS — Z0000 Encounter for general adult medical examination without abnormal findings: Secondary | ICD-10-CM | POA: Diagnosis not present

## 2024-03-30 DIAGNOSIS — E559 Vitamin D deficiency, unspecified: Secondary | ICD-10-CM

## 2024-03-30 DIAGNOSIS — F411 Generalized anxiety disorder: Secondary | ICD-10-CM

## 2024-03-30 DIAGNOSIS — M8589 Other specified disorders of bone density and structure, multiple sites: Secondary | ICD-10-CM

## 2024-03-30 DIAGNOSIS — F32A Depression, unspecified: Secondary | ICD-10-CM

## 2024-03-30 DIAGNOSIS — E039 Hypothyroidism, unspecified: Secondary | ICD-10-CM | POA: Diagnosis not present

## 2024-03-30 DIAGNOSIS — Z23 Encounter for immunization: Secondary | ICD-10-CM

## 2024-03-30 DIAGNOSIS — K5900 Constipation, unspecified: Secondary | ICD-10-CM

## 2024-03-30 DIAGNOSIS — F419 Anxiety disorder, unspecified: Secondary | ICD-10-CM

## 2024-03-30 DIAGNOSIS — E538 Deficiency of other specified B group vitamins: Secondary | ICD-10-CM | POA: Diagnosis not present

## 2024-03-30 DIAGNOSIS — E2839 Other primary ovarian failure: Secondary | ICD-10-CM

## 2024-03-30 DIAGNOSIS — R1084 Generalized abdominal pain: Secondary | ICD-10-CM

## 2024-03-30 DIAGNOSIS — E1169 Type 2 diabetes mellitus with other specified complication: Secondary | ICD-10-CM

## 2024-03-30 DIAGNOSIS — I6930 Unspecified sequelae of cerebral infarction: Secondary | ICD-10-CM

## 2024-03-30 DIAGNOSIS — E785 Hyperlipidemia, unspecified: Secondary | ICD-10-CM | POA: Diagnosis not present

## 2024-03-30 DIAGNOSIS — M5431 Sciatica, right side: Secondary | ICD-10-CM | POA: Diagnosis not present

## 2024-03-30 DIAGNOSIS — R109 Unspecified abdominal pain: Secondary | ICD-10-CM | POA: Insufficient documentation

## 2024-03-30 DIAGNOSIS — Z13820 Encounter for screening for osteoporosis: Secondary | ICD-10-CM

## 2024-03-30 LAB — COMPREHENSIVE METABOLIC PANEL WITH GFR
ALT: 8 U/L (ref 0–35)
AST: 13 U/L (ref 0–37)
Albumin: 4.2 g/dL (ref 3.5–5.2)
Alkaline Phosphatase: 34 U/L — ABNORMAL LOW (ref 39–117)
BUN: 20 mg/dL (ref 6–23)
CO2: 28 meq/L (ref 19–32)
Calcium: 10 mg/dL (ref 8.4–10.5)
Chloride: 101 meq/L (ref 96–112)
Creatinine, Ser: 0.77 mg/dL (ref 0.40–1.20)
GFR: 70.24 mL/min (ref >60.00)
Glucose, Bld: 155 mg/dL — ABNORMAL HIGH (ref 70–99)
Potassium: 4.3 meq/L (ref 3.5–5.1)
Sodium: 137 meq/L (ref 135–145)
Total Bilirubin: 0.6 mg/dL (ref 0.2–1.2)
Total Protein: 6.8 g/dL (ref 6.0–8.3)

## 2024-03-30 LAB — CBC WITH DIFFERENTIAL/PLATELET
Basophils Absolute: 0 K/uL (ref 0.0–0.1)
Basophils Relative: 0.5 % (ref 0.0–3.0)
Eosinophils Absolute: 0.2 K/uL (ref 0.0–0.7)
Eosinophils Relative: 1.9 % (ref 0.0–5.0)
HCT: 38.8 % (ref 36.0–46.0)
Hemoglobin: 13.4 g/dL (ref 12.0–15.0)
Lymphocytes Relative: 18.4 % (ref 12.0–46.0)
Lymphs Abs: 1.7 K/uL (ref 0.7–4.0)
MCHC: 34.5 g/dL (ref 30.0–36.0)
MCV: 91.9 fl (ref 78.0–100.0)
Monocytes Absolute: 0.6 K/uL (ref 0.1–1.0)
Monocytes Relative: 6.9 % (ref 3.0–12.0)
Neutro Abs: 6.7 K/uL (ref 1.4–7.7)
Neutrophils Relative %: 72.3 % (ref 43.0–77.0)
Platelets: 236 K/uL (ref 150.0–400.0)
RBC: 4.22 Mil/uL (ref 3.87–5.11)
RDW: 12.3 % (ref 11.5–15.5)
WBC: 9.2 K/uL (ref 4.0–10.5)

## 2024-03-30 LAB — LIPID PANEL
Cholesterol: 120 mg/dL (ref 0–200)
HDL: 55.1 mg/dL (ref >39.00)
LDL Cholesterol: 54 mg/dL (ref 0–99)
NonHDL: 64.55
Total CHOL/HDL Ratio: 2
Triglycerides: 55 mg/dL (ref 0.0–149.0)
VLDL: 11 mg/dL (ref 0.0–40.0)

## 2024-03-30 LAB — VITAMIN B12: Vitamin B-12: >1500 pg/mL — ABNORMAL HIGH (ref 211–911)

## 2024-03-30 LAB — VITAMIN D 25 HYDROXY (VIT D DEFICIENCY, FRACTURES): VITD: 85.28 ng/mL (ref 30.00–100.00)

## 2024-03-30 LAB — TSH: TSH: 2.06 u[IU]/mL (ref 0.35–5.50)

## 2024-03-30 LAB — HEMOGLOBIN A1C: Hgb A1c MFr Bld: 7 % — ABNORMAL HIGH (ref 4.6–6.5)

## 2024-03-30 MED ORDER — METFORMIN HCL ER 500 MG PO TB24: ORAL_TABLET | ORAL | 2 refills | Status: DC

## 2024-03-30 NOTE — Assessment & Plan Note (Signed)
 New Diffuse tenderness She feels the pain in bilateral lower quadrants frequently, especially at night Likely related to significant constipation Will be addressing her constipation Advised that if her abdominal pain does not improve then she needs to let me know so I can order CT scan, but expected to improve once her constipation gets controlled

## 2024-03-30 NOTE — Addendum Note (Signed)
 Addended by: CLAUDENE TOBIAS PARAS on: 03/30/2024 04:23 PM   Modules accepted: Orders

## 2024-03-30 NOTE — Assessment & Plan Note (Addendum)
 Chronic With hyperlipidemia  Lab Results  Component Value Date   HGBA1C 6.7 (H) 07/28/2023   Sugars controlled Check A1c Taking metformin  xr 1000 mg am, 500 mg pm, due to severe constipation will stop Trulicity  3 mg weekly Stressed regular exercise, diabetic diet Have her follow-up in 3 months given change in medication

## 2024-03-30 NOTE — Assessment & Plan Note (Signed)
 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

## 2024-03-30 NOTE — Assessment & Plan Note (Signed)
 Chronic Lab Results  Component Value Date   LDLCALC 52 07/28/2023   Continue pravastatin 40 mg daily Regular exercise and healthy diet encouraged

## 2024-03-30 NOTE — Assessment & Plan Note (Addendum)
 Chronic Did PT Has seen neurosurgery-getting injections Taking gabapentin 300 mg twice a day

## 2024-03-30 NOTE — Assessment & Plan Note (Signed)
 Chronic Trulicity  is likely contributing Has seen GI and Linzess  and Amitiza have not been helpful Currently taking Metamucil twice daily-continue Drinking plenty of fluids-continue Stop Trulicity  Gave her other think she can add to the Metamucil to help with stools She will let me know if there is no improvement after stopping the Trulicity 

## 2024-03-30 NOTE — Assessment & Plan Note (Signed)
 Chronic Continue vitamin D supplementation Check vitamin D level

## 2024-03-30 NOTE — Assessment & Plan Note (Signed)
Chronic Controlled, stable Does have some mild depression and anxiety at times, but overall controlled Continue lexparo 10 mg daily, diazepam 5 mg HS

## 2024-03-30 NOTE — Assessment & Plan Note (Signed)
 Chronic Last DEXA normal in 2021, but history of osteopenia Due for dexa - ordered Taking calcium and vitamin d  Check vitamin D  level Stressed regular exercise

## 2024-03-30 NOTE — Assessment & Plan Note (Signed)
 Chronic Continue B12 supplementation Check B12 level

## 2024-03-30 NOTE — Assessment & Plan Note (Signed)
 Chronic  Clinically euthyroid Lab Results  Component Value Date   TSH 3.73 04/02/2023   Check TSH, titrate medication if necessary Continue levothyroxine  50 mcg daily

## 2024-03-30 NOTE — Assessment & Plan Note (Signed)
 Chronic BP well controlled CBC, CMP Continue benazepril  20 mg daily

## 2024-04-04 ENCOUNTER — Ambulatory Visit: Payer: Self-pay | Admitting: Internal Medicine

## 2024-04-04 DIAGNOSIS — M8589 Other specified disorders of bone density and structure, multiple sites: Secondary | ICD-10-CM

## 2024-04-07 ENCOUNTER — Ambulatory Visit
Admission: RE | Admit: 2024-04-07 | Discharge: 2024-04-07 | Disposition: A | Discharge status: Home / Self Care | Source: Ambulatory Visit | Attending: Internal Medicine

## 2024-04-07 DIAGNOSIS — E2839 Other primary ovarian failure: Secondary | ICD-10-CM

## 2024-04-07 DIAGNOSIS — Z13820 Encounter for screening for osteoporosis: Secondary | ICD-10-CM

## 2024-04-10 DIAGNOSIS — Z1231 Encounter for screening mammogram for malignant neoplasm of breast: Secondary | ICD-10-CM | POA: Diagnosis not present

## 2024-04-10 LAB — HM MAMMOGRAPHY

## 2024-04-11 ENCOUNTER — Other Ambulatory Visit: Payer: Self-pay | Admitting: Internal Medicine

## 2024-04-13 DIAGNOSIS — N3020 Other chronic cystitis without hematuria: Secondary | ICD-10-CM | POA: Diagnosis not present

## 2024-04-13 DIAGNOSIS — R152 Fecal urgency: Secondary | ICD-10-CM | POA: Diagnosis not present

## 2024-04-15 ENCOUNTER — Encounter: Payer: Self-pay | Admitting: Internal Medicine

## 2024-05-06 NOTE — Progress Notes (Unsigned)
 Subjective:    Patient ID: [name], female    DOB: Sep 03, 1940, 83 y.o.   MRN: [id number]     HPI [name] is here for follow up of her chronic medical problems.  Last dexa done 04/07/24  Comparison: 09/16/2019 Clinical data: Pt is a 83 y.o. female without previous history of fragility (osteoporotic) fracture. On calcium and vitamin D .   Results:   Lumbar spine L1-L4 Femoral neck (FN) 33% distal radius  T-score +0.1 RFN: -1.3 LFN: -1.5 n/a  Change in BMD from previous DXA test (%) +0.1% -8.3%* n/a  (*) statistically significant   FRAX score: 10 year major osteoporotic risk: 13.6%. 10 year hip fracture risk: 3.7%   Medications and allergies reviewed with patient and updated if appropriate.  Current Outpatient Medications on File Prior to Visit  Medication Sig Dispense Refill   acetaminophen  (TYLENOL ) 500 MG tablet Take 1,000 mg by mouth every morning.     benazepril  (LOTENSIN ) 20 MG tablet TAKE 1 TABLET DAILY 90 tablet 3   cephALEXin (KEFLEX) 250 MG capsule Take 250 mg by mouth daily.     Cholecalciferol (VITAMIN D3) 50 MCG (2000 UT) TABS Take 2,000 Units by mouth at bedtime.     clopidogrel  (PLAVIX ) 75 MG tablet TAKE 1 TABLET(75 MG) BY MOUTH DAILY 30 tablet 2   cyanocobalamin  ([employer] VITAMIN B12) 1000 MCG tablet Take by mouth.     Cyanocobalamin  (VITAMIN B-12) 5000 MCG TBDP Take 5,000 mcg by mouth in the morning.     diazepam  (VALIUM ) 5 MG tablet Take 1 tablet (5 mg total) by mouth at bedtime. 30 tablet 5   escitalopram  (LEXAPRO ) 10 MG tablet TAKE 1 TABLET DAILY 90 tablet 3   estradiol (ESTRACE) 0.1 MG/GM vaginal cream Place 1 Applicatorful vaginally every three (3) days as needed (vaginal dryness).     famotidine  (PEPCID ) 40 MG tablet Take 1 tablet (40 mg total) by mouth 2 (two) times daily. 60 tablet 5   FIBER PO Take by mouth.     gabapentin (NEURONTIN) 300 MG capsule Take 300 mg by mouth 2 (two) times daily.     Lancets Misc. MISC UAD to check sugars. E11.9   May  substitute to any manufacturer covered by patient's insurance. 100 each 0   levothyroxine  (SYNTHROID ) 50 MCG tablet TAKE 1 TABLET(50 MCG) BY MOUTH DAILY 90 tablet 3   lubiprostone (AMITIZA) 8 MCG capsule 8 mcg 2 (two) times daily with a meal. (Patient not taking: Reported on 03/30/2024)     metFORMIN  (GLUCOPHAGE -XR) 500 MG 24 hr tablet TAKE 2 TABLETS WITH BREAKFAST AND 1 TABLET WITH DINNER 270 tablet 2   Multiple Vitamin (MULTIVITAMIN WITH MINERALS) TABS tablet Take 1 tablet by mouth daily. One A Day for Women     Na Sulfate-K Sulfate-Mg Sulfate concentrate (SUPREP) 17.5-3.13-1.6 GM/177ML SOLN Take 177 mLs by mouth.     polyethylene glycol (MIRALAX  / GLYCOLAX ) 17 g packet Take 17 g by mouth daily as needed for mild constipation or moderate constipation.     pravastatin  (PRAVACHOL ) 40 MG tablet TAKE 1 TABLET AT BEDTIME 90 tablet 3   Psyllium (HM FIBER POWDER PO) Take 1 Scoop by mouth 3 (three) times a week.     Vitamin D -Vitamin K (D3 + K2 DOTS) 1000-90 UNIT-MCG TABS Take by mouth.     No current facility-administered medications on file prior to visit.     Review of Systems     Objective:  There were no vitals  filed for this visit. BP Readings from Last 3 Encounters:  03/30/24 130/74  12/31/23 120/80  11/29/23 138/80   Wt Readings from Last 3 Encounters:  03/30/24 147 lb (66.7 kg)  03/03/24 157 lb (71.2 kg)  12/31/23 157 lb 8 oz (71.4 kg)   There is no height or weight on file to calculate BMI.    Physical Exam     Lab Results  Component Value Date   WBC 9.2 03/30/2024   HGB 13.4 03/30/2024   HCT 38.8 03/30/2024   PLT 236.0 03/30/2024   GLUCOSE 155 (H) 03/30/2024   CHOL 120 03/30/2024   TRIG 55.0 03/30/2024   HDL 55.10 03/30/2024   LDLDIRECT 61.0 06/30/2018   LDLCALC 54 03/30/2024   ALT 8 03/30/2024   AST 13 03/30/2024   NA 137 03/30/2024   K 4.3 03/30/2024   CL 101 03/30/2024   CREATININE 0.77 03/30/2024   BUN 20 03/30/2024   CO2 28 03/30/2024   TSH 2.06  03/30/2024   INR 1.8 (H) 04/02/2008   HGBA1C 7.0 (H) 03/30/2024   MICROALBUR 3.9 (H) 11/13/2023     Assessment & Plan:    See Problem List for Assessment and Plan of chronic medical problems.

## 2024-05-07 ENCOUNTER — Encounter: Payer: Self-pay | Admitting: Internal Medicine

## 2024-05-07 ENCOUNTER — Ambulatory Visit (INDEPENDENT_AMBULATORY_CARE_PROVIDER_SITE_OTHER): Admitting: Internal Medicine

## 2024-05-07 VITALS — BP 136/70 | HR 63 | Temp 98.0°F | Wt 150.0 lb

## 2024-05-07 DIAGNOSIS — E559 Vitamin D deficiency, unspecified: Secondary | ICD-10-CM

## 2024-05-07 DIAGNOSIS — M810 Age-related osteoporosis without current pathological fracture: Secondary | ICD-10-CM | POA: Diagnosis not present

## 2024-05-07 MED ORDER — DENOSUMAB 60 MG/ML ~~LOC~~ SOSY: 60.0000 mg | PREFILLED_SYRINGE | Freq: Once | SUBCUTANEOUS | 0 refills | Status: AC

## 2024-05-07 MED ORDER — METFORMIN HCL ER 500 MG PO TB24: 1000.0000 mg | ORAL_TABLET | Freq: Two times a day (BID) | ORAL | 2 refills | Status: AC

## 2024-05-07 NOTE — Patient Instructions (Addendum)
     Medications changes include :   start prolia  - we will check to see if this is covered by insurance      Return for follow up as scheduled.

## 2024-05-07 NOTE — Assessment & Plan Note (Addendum)
 New Has osteopenia with elevated FRAX likely related to age and previous fracture Reviewed DEXA and discussed possible treatment and side effects Continue calcium and vitamin D  supplementation Last vitamin D  level was good-is on the higher side of normal Encouraged as much exercise as possible Interested in prolia - will start Q 6 months if covered by insurance

## 2024-05-07 NOTE — Assessment & Plan Note (Signed)
 Chronic Last vitamin D  Lab Results  Component Value Date   VD25OH 85.28 03/30/2024   Continue vitamin D  supplementation

## 2024-05-11 ENCOUNTER — Other Ambulatory Visit: Payer: Self-pay | Admitting: Internal Medicine

## 2024-05-19 ENCOUNTER — Other Ambulatory Visit: Payer: Self-pay | Admitting: Internal Medicine

## 2024-05-25 ENCOUNTER — Other Ambulatory Visit: Payer: Self-pay

## 2024-06-03 ENCOUNTER — Other Ambulatory Visit: Payer: Self-pay | Admitting: Neurology

## 2024-06-10 DIAGNOSIS — H35341 Macular cyst, hole, or pseudohole, right eye: Secondary | ICD-10-CM | POA: Diagnosis not present

## 2024-06-10 DIAGNOSIS — H26493 Other secondary cataract, bilateral: Secondary | ICD-10-CM | POA: Diagnosis not present

## 2024-06-10 DIAGNOSIS — H35373 Puckering of macula, bilateral: Secondary | ICD-10-CM | POA: Diagnosis not present

## 2024-06-10 DIAGNOSIS — H43813 Vitreous degeneration, bilateral: Secondary | ICD-10-CM | POA: Diagnosis not present

## 2024-06-15 DIAGNOSIS — M5416 Radiculopathy, lumbar region: Secondary | ICD-10-CM | POA: Diagnosis not present

## 2024-06-26 DIAGNOSIS — R1031 Right lower quadrant pain: Secondary | ICD-10-CM | POA: Diagnosis not present

## 2024-06-26 DIAGNOSIS — R195 Other fecal abnormalities: Secondary | ICD-10-CM | POA: Diagnosis not present

## 2024-06-26 DIAGNOSIS — R198 Other specified symptoms and signs involving the digestive system and abdomen: Secondary | ICD-10-CM | POA: Diagnosis not present

## 2024-06-26 DIAGNOSIS — R1032 Left lower quadrant pain: Secondary | ICD-10-CM | POA: Diagnosis not present

## 2024-06-26 DIAGNOSIS — K5904 Chronic idiopathic constipation: Secondary | ICD-10-CM | POA: Diagnosis not present

## 2024-06-29 NOTE — Patient Instructions (Addendum)
     Blood work was ordered.       Medications changes include :   glipizide  5 mg twice daily     Return in about 6 months (around 12/29/2024) for Physical Exam.

## 2024-06-29 NOTE — Progress Notes (Unsigned)
 Subjective:    Patient ID: [name], female    DOB: 02/12/1941, 83 y.o.   MRN: [id number]     HPI [name] is here for follow up of her chronic medical problems.  Still struggling with constipation - following with GI.  Taking senokot and miralax  daily.  Sugars high -- often in the 200's   Medications and allergies reviewed with patient and updated if appropriate.  Current Outpatient Medications on File Prior to Visit  Medication Sig Dispense Refill  . acetaminophen  (TYLENOL ) 500 MG tablet Take 1,000 mg by mouth every morning.    . benazepril  (LOTENSIN ) 20 MG tablet TAKE 1 TABLET DAILY 90 tablet 3  . cephALEXin (KEFLEX) 250 MG capsule Take 250 mg by mouth daily.    . Cholecalciferol (VITAMIN D3) 50 MCG (2000 UT) TABS Take 2,000 Units by mouth at bedtime.    . clopidogrel  (PLAVIX ) 75 MG tablet TAKE 1 TABLET(75 MG) BY MOUTH DAILY 90 tablet 3  . cyanocobalamin  ([employer] VITAMIN B12) 1000 MCG tablet Take by mouth.    . Cyanocobalamin  (VITAMIN B-12) 5000 MCG TBDP Take 5,000 mcg by mouth in the morning.    . diazepam  (VALIUM ) 5 MG tablet TAKE 1 TABLET(5 MG) BY MOUTH AT BEDTIME 30 tablet 2  . escitalopram  (LEXAPRO ) 10 MG tablet TAKE 1 TABLET DAILY 90 tablet 3  . estradiol (ESTRACE) 0.1 MG/GM vaginal cream Place 1 Applicatorful vaginally every three (3) days as needed (vaginal dryness).    . gabapentin (NEURONTIN) 300 MG capsule Take 300 mg by mouth 2 (two) times daily.    . Lancets Misc. MISC UAD to check sugars. E11.9   May substitute to any manufacturer covered by patient's insurance. 100 each 0  . levothyroxine  (SYNTHROID ) 50 MCG tablet TAKE 1 TABLET(50 MCG) BY MOUTH DAILY 90 tablet 3  . metFORMIN  (GLUCOPHAGE -XR) 500 MG 24 hr tablet Take 2 tablets (1,000 mg total) by mouth 2 (two) times daily with a meal. 360 tablet 2  . Multiple Vitamin (MULTIVITAMIN WITH MINERALS) TABS tablet Take 1 tablet by mouth daily. One A Day for Women    . Na Sulfate-K Sulfate-Mg Sulfate concentrate  (SUPREP) 17.5-3.13-1.6 GM/177ML SOLN Take 177 mLs by mouth.    . polyethylene glycol (MIRALAX  / GLYCOLAX ) 17 g packet Take 17 g by mouth daily as needed for mild constipation or moderate constipation.    . pravastatin  (PRAVACHOL ) 40 MG tablet TAKE 1 TABLET AT BEDTIME 90 tablet 3  . Psyllium (HM FIBER POWDER PO) Take 1 Scoop by mouth 3 (three) times a week.    . Vitamin D -Vitamin K (D3 + K2 DOTS) 1000-90 UNIT-MCG TABS Take by mouth.     No current facility-administered medications on file prior to visit.     Review of Systems  Constitutional:  Negative for fever.  Respiratory:  Negative for cough, shortness of breath and wheezing.   Cardiovascular:  Negative for chest pain, palpitations and leg swelling.  Gastrointestinal:  Positive for abdominal pain (from constipation) and constipation.  Musculoskeletal:  Positive for arthralgias and back pain.  Neurological:  Positive for headaches (occ). Negative for light-headedness.       Objective:   Vitals:   06/30/24 1308  BP: 130/72  Pulse: 71  Temp: 98 F (36.7 C)  SpO2: 98%   BP Readings from Last 3 Encounters:  06/30/24 130/72  05/07/24 136/70  03/30/24 130/74   Wt Readings from Last 3 Encounters:  06/30/24 156 lb (70.8 kg)  05/07/24  150 lb (68 kg)  03/30/24 147 lb (66.7 kg)   Body mass index is 26.57 kg/m.    Physical Exam     Lab Results  Component Value Date   WBC 9.2 03/30/2024   HGB 13.4 03/30/2024   HCT 38.8 03/30/2024   PLT 236.0 03/30/2024   GLUCOSE 155 (H) 03/30/2024   CHOL 120 03/30/2024   TRIG 55.0 03/30/2024   HDL 55.10 03/30/2024   LDLDIRECT 61.0 06/30/2018   LDLCALC 54 03/30/2024   ALT 8 03/30/2024   AST 13 03/30/2024   NA 137 03/30/2024   K 4.3 03/30/2024   CL 101 03/30/2024   CREATININE 0.77 03/30/2024   BUN 20 03/30/2024   CO2 28 03/30/2024   TSH 2.06 03/30/2024   INR 1.8 (H) 04/02/2008   HGBA1C 7.0 (H) 03/30/2024   MICROALBUR 3.9 (H) 11/13/2023     Assessment & Plan:    See  Problem List for Assessment and Plan of chronic medical problems.

## 2024-06-30 ENCOUNTER — Ambulatory Visit: Admitting: Internal Medicine

## 2024-06-30 ENCOUNTER — Encounter: Payer: Self-pay | Admitting: Internal Medicine

## 2024-06-30 VITALS — BP 130/72 | HR 71 | Temp 98.0°F | Ht 64.25 in | Wt 156.0 lb

## 2024-06-30 DIAGNOSIS — E538 Deficiency of other specified B group vitamins: Secondary | ICD-10-CM | POA: Diagnosis not present

## 2024-06-30 DIAGNOSIS — E1159 Type 2 diabetes mellitus with other circulatory complications: Secondary | ICD-10-CM

## 2024-06-30 DIAGNOSIS — M542 Cervicalgia: Secondary | ICD-10-CM

## 2024-06-30 DIAGNOSIS — E559 Vitamin D deficiency, unspecified: Secondary | ICD-10-CM

## 2024-06-30 DIAGNOSIS — E039 Hypothyroidism, unspecified: Secondary | ICD-10-CM

## 2024-06-30 DIAGNOSIS — M5431 Sciatica, right side: Secondary | ICD-10-CM

## 2024-06-30 DIAGNOSIS — Z87440 Personal history of urinary (tract) infections: Secondary | ICD-10-CM | POA: Insufficient documentation

## 2024-06-30 DIAGNOSIS — E1169 Type 2 diabetes mellitus with other specified complication: Secondary | ICD-10-CM | POA: Diagnosis not present

## 2024-06-30 DIAGNOSIS — M810 Age-related osteoporosis without current pathological fracture: Secondary | ICD-10-CM

## 2024-06-30 DIAGNOSIS — I6930 Unspecified sequelae of cerebral infarction: Secondary | ICD-10-CM

## 2024-06-30 DIAGNOSIS — I152 Hypertension secondary to endocrine disorders: Secondary | ICD-10-CM | POA: Diagnosis not present

## 2024-06-30 DIAGNOSIS — F419 Anxiety disorder, unspecified: Secondary | ICD-10-CM

## 2024-06-30 DIAGNOSIS — E785 Hyperlipidemia, unspecified: Secondary | ICD-10-CM | POA: Diagnosis not present

## 2024-06-30 LAB — CBC WITH DIFFERENTIAL/PLATELET
Basophils Absolute: 0.1 K/uL (ref 0.0–0.1)
Basophils Relative: 0.5 % (ref 0.0–3.0)
Eosinophils Absolute: 0.1 K/uL (ref 0.0–0.7)
Eosinophils Relative: 1.2 % (ref 0.0–5.0)
HCT: 38.4 % (ref 36.0–46.0)
Hemoglobin: 13.4 g/dL (ref 12.0–15.0)
Lymphocytes Relative: 17.2 % (ref 12.0–46.0)
Lymphs Abs: 1.8 K/uL (ref 0.7–4.0)
MCHC: 34.9 g/dL (ref 30.0–36.0)
MCV: 90 fl (ref 78.0–100.0)
Monocytes Absolute: 0.8 K/uL (ref 0.1–1.0)
Monocytes Relative: 7.6 % (ref 3.0–12.0)
Neutro Abs: 7.6 K/uL (ref 1.4–7.7)
Neutrophils Relative %: 73.5 % (ref 43.0–77.0)
Platelets: 262 K/uL (ref 150.0–400.0)
RBC: 4.27 Mil/uL (ref 3.87–5.11)
RDW: 12.7 % (ref 11.5–15.5)
WBC: 10.4 K/uL (ref 4.0–10.5)

## 2024-06-30 LAB — LIPID PANEL
Cholesterol: 108 mg/dL (ref 0–200)
HDL: 52.8 mg/dL (ref >39.00)
LDL Cholesterol: 42 mg/dL (ref 0–99)
NonHDL: 55.28
Total CHOL/HDL Ratio: 2
Triglycerides: 68 mg/dL (ref 0.0–149.0)
VLDL: 13.6 mg/dL (ref 0.0–40.0)

## 2024-06-30 LAB — COMPREHENSIVE METABOLIC PANEL WITH GFR
ALT: 12 U/L (ref 0–35)
AST: 13 U/L (ref 0–37)
Albumin: 4.6 g/dL (ref 3.5–5.2)
Alkaline Phosphatase: 45 U/L (ref 39–117)
BUN: 22 mg/dL (ref 6–23)
CO2: 27 meq/L (ref 19–32)
Calcium: 10.4 mg/dL (ref 8.4–10.5)
Chloride: 101 meq/L (ref 96–112)
Creatinine, Ser: 0.82 mg/dL (ref 0.40–1.20)
GFR: 65.02 mL/min (ref >60.00)
Glucose, Bld: 198 mg/dL — ABNORMAL HIGH (ref 70–99)
Potassium: 5 meq/L (ref 3.5–5.1)
Sodium: 136 meq/L (ref 135–145)
Total Bilirubin: 0.7 mg/dL (ref 0.2–1.2)
Total Protein: 6.9 g/dL (ref 6.0–8.3)

## 2024-06-30 LAB — MICROALBUMIN / CREATININE URINE RATIO
Creatinine,U: 151.5 mg/dL
Microalb Creat Ratio: 19.3 mg/g (ref 0.0–30.0)
Microalb, Ur: 2.9 mg/dL — ABNORMAL HIGH (ref 0.0–1.9)

## 2024-06-30 LAB — VITAMIN B12: Vitamin B-12: 1460 pg/mL — ABNORMAL HIGH (ref 211–911)

## 2024-06-30 LAB — HEMOGLOBIN A1C: Hgb A1c MFr Bld: 7.6 % — ABNORMAL HIGH (ref 4.6–6.5)

## 2024-06-30 LAB — TSH: TSH: 2.22 u[IU]/mL (ref 0.35–5.50)

## 2024-06-30 MED ORDER — GLIPIZIDE 5 MG PO TABS: 5.0000 mg | ORAL_TABLET | Freq: Two times a day (BID) | ORAL | 5 refills | Status: DC

## 2024-06-30 NOTE — Assessment & Plan Note (Signed)
 Chronic  Clinically euthyroid Lab Results  Component Value Date   TSH 2.06 03/30/2024   Check TSH, titrate medication if necessary Continue levothyroxine  50 mcg daily

## 2024-06-30 NOTE — Assessment & Plan Note (Signed)
 Chronic BP well controlled CBC, CMP Continue benazepril  20 mg daily

## 2024-06-30 NOTE — Assessment & Plan Note (Signed)
 Chronic Has been in physical therapy Has seen neurosurgery-getting injections Taking gabapentin 300 mg twice a day

## 2024-06-30 NOTE — Assessment & Plan Note (Signed)
 Chronic Last vitamin D  Lab Results  Component Value Date   VD25OH 85.28 03/30/2024   Continue vitamin D  supplementation

## 2024-06-30 NOTE — Assessment & Plan Note (Addendum)
 Chronic Associated with hyperlipidemia, hypertension, history of CVA  Lab Results  Component Value Date   HGBA1C 7.0 (H) 03/30/2024   Sugars controlled with last blood work but sugars have been elevated at home - often in the 200's Check A1c, urine albumin/creatinine ratio Continue metformin  xr 1000 mg twice daily Start glipizide  5 mg bid - advised this can cause low sugars Trulicity  stopped due to severe constipation Diabetic diet As much exercise as possible

## 2024-06-30 NOTE — Assessment & Plan Note (Signed)
 Chronic Following with urology On Estrace vaginal cream, cranberry pills Taking Keflex to 50 mg daily

## 2024-06-30 NOTE — Assessment & Plan Note (Signed)
 Chronic DEXA up-to-date On Prolia  every 6 months Last Prolia  05/07/2024 Continue calcium and vitamin D  supplementation Check vitamin D  level Exercise is much as possible

## 2024-06-30 NOTE — Assessment & Plan Note (Signed)
 Chronic Lab Results  Component Value Date   LDLCALC 54 03/30/2024   Continue pravastatin  40 mg daily Check lipids, CMP, TSH Regular exercise and healthy diet encouraged

## 2024-06-30 NOTE — Assessment & Plan Note (Signed)
 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

## 2024-06-30 NOTE — Assessment & Plan Note (Signed)
Chronic Controlled, stable Does have some mild depression and anxiety at times, but overall controlled Continue lexparo 10 mg daily, diazepam 5 mg HS

## 2024-06-30 NOTE — Assessment & Plan Note (Signed)
 Chronic Continue B12 supplementation Check B12 level

## 2024-06-30 NOTE — Assessment & Plan Note (Signed)
 Chronic Following with orthopedics Taking gabapentin 300 mg bid

## 2024-07-01 ENCOUNTER — Telehealth: Payer: Self-pay

## 2024-07-01 NOTE — Telephone Encounter (Signed)
 Copied from CRM #[id number]. Topic: Clinical - Medication Question >> Jun 30, 2024  4:52 PM Alfonso ORN wrote: Reason for CRM: pt called to f/u on discussion with provider regarding bone density test. She wants to know if there was a rx to be sent . Please contact pt to advise

## 2024-07-02 ENCOUNTER — Other Ambulatory Visit: Payer: Self-pay

## 2024-07-02 ENCOUNTER — Ambulatory Visit: Payer: Self-pay | Admitting: Internal Medicine

## 2024-07-02 ENCOUNTER — Telehealth: Payer: Self-pay

## 2024-07-02 DIAGNOSIS — M810 Age-related osteoporosis without current pathological fracture: Secondary | ICD-10-CM

## 2024-07-02 MED ORDER — DENOSUMAB 60 MG/ML ~~LOC~~ SOSY: 60.0000 mg | PREFILLED_SYRINGE | Freq: Once | SUBCUTANEOUS | Status: AC

## 2024-07-02 MED ORDER — GLIPIZIDE 5 MG PO TABS: 5.0000 mg | ORAL_TABLET | Freq: Two times a day (BID) | ORAL | 5 refills | Status: DC

## 2024-07-02 NOTE — Telephone Encounter (Signed)
 Prolia  VOB initiated via [email or web address]  Next Prolia  inj DUE: NEW START

## 2024-07-02 NOTE — Telephone Encounter (Signed)
 Prolia  ordered for patient today and waiting for benefits.

## 2024-07-03 DIAGNOSIS — K862 Cyst of pancreas: Secondary | ICD-10-CM | POA: Diagnosis not present

## 2024-07-06 ENCOUNTER — Other Ambulatory Visit (HOSPITAL_COMMUNITY): Payer: Self-pay

## 2024-07-06 NOTE — Telephone Encounter (Deleted)
 [name]

## 2024-07-06 NOTE — Telephone Encounter (Addendum)
 MEDICAL PA SUBMITTED VIA [employer]. Authorization Number : [id number]    APPROVED     PHARMACY BENEFIT: PLAN EXCLUSION

## 2024-07-08 NOTE — Telephone Encounter (Signed)
 [name]

## 2024-07-08 NOTE — Telephone Encounter (Signed)
 Pt ready for scheduling for PROLIA  on or after : 07/08/24  Option# 1: Buy/Bill (Office supplied medication)  Out-of-pocket cost due at time of clinic visit: $0  Number of injection/visits approved: 2  Primary: [employer] Prolia  co-insurance: 0% Admin fee co-insurance: 0%  Secondary: [employer] Prolia  co-insurance:  Admin fee co-insurance:   Medical Benefit Details: Date Benefits were checked: 07/08/24 Deductible: NO/ Coinsurance: 0%/ Admin Fee: 0%  Prior Auth: APPROVED MARDEL) PA# [id number]  Expiration Date: 07/06/24-07/06/25  # of doses approved: 2 ----------------------------------------------------------------------- Option# 2- Med Obtained from pharmacy:  Pharmacy benefit: Copay $--- (Paid to pharmacy) Admin Fee: --- (Pay at clinic)  Prior Auth: --- PA# Expiration Date:   # of doses approved:   If patient wants fill through the pharmacy benefit please send prescription to: ---, and include estimated need by date in rx notes. Pharmacy will ship medication directly to the office.  Patient NOT eligible for Prolia  Copay Card. Copay Card can make patient's cost as little as $25. Link to apply: [email or web address]://[email or web address]  ** This summary of benefits is an estimation of the patient's out-of-pocket cost. Exact cost may very based on individual plan coverage.

## 2024-07-13 ENCOUNTER — Telehealth: Payer: Self-pay

## 2024-07-13 NOTE — Telephone Encounter (Signed)
 Copied from CRM #[id number]. Topic: Clinical - Medication Question >> Jul 13, 2024 11:17 AM Alfonso HERO wrote: Patient calling stating the medication she was changed to isn't making her sugar levels decrease she is still in the high 200's. She stated that she had a colonoscopy done and she was told she has a spot on her pancreas and is wondering if that could be the reason for the high numbers. She is asking for a call back or a MyChart message.

## 2024-07-20 MED ORDER — GLIPIZIDE ER 10 MG PO TB24: 10.0000 mg | ORAL_TABLET | Freq: Every day | ORAL | 3 refills | Status: AC

## 2024-07-20 NOTE — Telephone Encounter (Signed)
 Oh yes, the 461 is very high, and the 200's are not much better.  Please continue the metformin  as you take, but we should increase the glucotrol  xl to 10 mg per day.   I will do this, and you should hear from the office as well.   Thanks

## 2024-07-20 NOTE — Addendum Note (Signed)
 Addended by: NORLEEN LYNWOOD ORN on: 07/20/2024 05:05 PM   Modules accepted: Orders

## 2024-07-21 NOTE — Telephone Encounter (Signed)
 No, she should only take the 10 mg per day (not the 5 mg)  - thanks

## 2024-08-04 NOTE — Progress Notes (Unsigned)
 "   Subjective:    Patient ID: [name], female    DOB: 1940-10-15, 84 y.o.   MRN: [id number]      HPI [name] is here for No chief complaint on file.   Currently taking for her diabetes metformin  XR 1000 mg twice daily, glipizide  5 mg twice daily   ?  Rybelsus , low-dose Mounjaro, Januvia    Medications and allergies reviewed with patient and updated if appropriate.  Medications Ordered Prior to Encounter[1]  Review of Systems     Objective:  There were no vitals filed for this visit. BP Readings from Last 3 Encounters:  06/30/24 130/72  05/07/24 136/70  03/30/24 130/74   Wt Readings from Last 3 Encounters:  06/30/24 156 lb (70.8 kg)  05/07/24 150 lb (68 kg)  03/30/24 147 lb (66.7 kg)   There is no height or weight on file to calculate BMI.    Physical Exam         Assessment & Plan:    See Problem List for Assessment and Plan of chronic medical problems.         [1]  Current Outpatient Medications on File Prior to Visit  Medication Sig Dispense Refill   acetaminophen  (TYLENOL ) 500 MG tablet Take 1,000 mg by mouth every morning.     benazepril  (LOTENSIN ) 20 MG tablet TAKE 1 TABLET DAILY 90 tablet 3   cephALEXin (KEFLEX) 250 MG capsule Take 250 mg by mouth daily.     Cholecalciferol (VITAMIN D3) 50 MCG (2000 UT) TABS Take 2,000 Units by mouth at bedtime.     clopidogrel  (PLAVIX ) 75 MG tablet TAKE 1 TABLET(75 MG) BY MOUTH DAILY 90 tablet 3   cyanocobalamin  ([employer]S VITAMIN B12) 1000 MCG tablet Take by mouth.     Cyanocobalamin  (VITAMIN B-12) 5000 MCG TBDP Take 5,000 mcg by mouth in the morning.     diazepam  (VALIUM ) 5 MG tablet TAKE 1 TABLET(5 MG) BY MOUTH AT BEDTIME 30 tablet 2   escitalopram  (LEXAPRO ) 10 MG tablet TAKE 1 TABLET DAILY 90 tablet 3   estradiol (ESTRACE) 0.1 MG/GM vaginal cream Place 1 Applicatorful vaginally every three (3) days as needed (vaginal dryness).     gabapentin (NEURONTIN) 300 MG capsule Take 300 mg by mouth 2 (two) times  daily.     glipiZIDE  (GLUCOTROL  XL) 10 MG 24 hr tablet Take 1 tablet (10 mg total) by mouth daily with breakfast. 90 tablet 3   Lancets Misc. MISC UAD to check sugars. E11.9   May substitute to any manufacturer covered by patient's insurance. 100 each 0   levothyroxine  (SYNTHROID ) 50 MCG tablet TAKE 1 TABLET(50 MCG) BY MOUTH DAILY 90 tablet 3   metFORMIN  (GLUCOPHAGE -XR) 500 MG 24 hr tablet Take 2 tablets (1,000 mg total) by mouth 2 (two) times daily with a meal. 360 tablet 2   Multiple Vitamin (MULTIVITAMIN WITH MINERALS) TABS tablet Take 1 tablet by mouth daily. One A Day for Women     Na Sulfate-K Sulfate-Mg Sulfate concentrate (SUPREP) 17.5-3.13-1.6 GM/177ML SOLN Take 177 mLs by mouth.     polyethylene glycol (MIRALAX  / GLYCOLAX ) 17 g packet Take 17 g by mouth daily as needed for mild constipation or moderate constipation.     pravastatin  (PRAVACHOL ) 40 MG tablet TAKE 1 TABLET AT BEDTIME 90 tablet 3   Psyllium (HM FIBER POWDER PO) Take 1 Scoop by mouth 3 (three) times a week.     Vitamin D -Vitamin K (D3 + K2 DOTS) 1000-90 UNIT-MCG TABS Take by  mouth.     Current Facility-Administered Medications on File Prior to Visit  Medication Dose Route Frequency Provider Last Rate Last Admin   denosumab  (PROLIA ) injection 60 mg  60 mg Subcutaneous Once Geofm Glade PARAS, MD       "

## 2024-08-05 ENCOUNTER — Ambulatory Visit: Admitting: Internal Medicine

## 2024-08-05 ENCOUNTER — Encounter: Payer: Self-pay | Admitting: Internal Medicine

## 2024-08-05 VITALS — BP 130/74 | HR 58 | Temp 98.0°F | Ht 64.25 in | Wt 165.0 lb

## 2024-08-05 DIAGNOSIS — E785 Hyperlipidemia, unspecified: Secondary | ICD-10-CM | POA: Diagnosis not present

## 2024-08-05 DIAGNOSIS — E1169 Type 2 diabetes mellitus with other specified complication: Secondary | ICD-10-CM

## 2024-08-05 DIAGNOSIS — Z7984 Long term (current) use of oral hypoglycemic drugs: Secondary | ICD-10-CM

## 2024-08-05 NOTE — Assessment & Plan Note (Signed)
 Chronic Associated with hyperlipidemia, hypertension, history of CVA  Lab Results  Component Value Date   HGBA1C 7.6 (H) 06/30/2024   Sugars not ideally controlled-typically in the high 100s-200s.  On 1 occasion it did going to the 400s-she is not sure why Currently taking glipizide  10 mg daily She did discontinue metformin  when the above medication was increased and she needs to stay on that Restart metformin  xr 1000 mg twice daily She will update me with her sugar readings from home and if her sugars are not ideally controlled will add another medication and then could consider Januvia /onglyza Diabetic diet As much exercise as possible

## 2024-08-05 NOTE — Patient Instructions (Addendum)
" ° ° ° ° ° ° °  Medications changes include :   restart Metformin  twice a day    Update me with your sugars in a few weeks.   "

## 2024-08-21 ENCOUNTER — Other Ambulatory Visit: Payer: Self-pay | Admitting: Internal Medicine

## 2024-12-29 ENCOUNTER — Ambulatory Visit: Admitting: Internal Medicine

## 2025-03-04 ENCOUNTER — Ambulatory Visit
